# Patient Record
Sex: Female | Born: 1935
Health system: Southern US, Community
[De-identification: ages and names within clinical notes are randomized; demographics above are authoritative.]

## PROBLEM LIST (undated history)

## (undated) DIAGNOSIS — N200 Calculus of kidney: Secondary | ICD-10-CM

## (undated) DIAGNOSIS — M546 Pain in thoracic spine: Secondary | ICD-10-CM

## (undated) DIAGNOSIS — S12600A Unspecified displaced fracture of seventh cervical vertebra, initial encounter for closed fracture: Secondary | ICD-10-CM

## (undated) DIAGNOSIS — F0280 Dementia in other diseases classified elsewhere without behavioral disturbance: Secondary | ICD-10-CM

## (undated) DIAGNOSIS — R634 Abnormal weight loss: Secondary | ICD-10-CM

## (undated) DIAGNOSIS — K644 Residual hemorrhoidal skin tags: Secondary | ICD-10-CM

## (undated) DIAGNOSIS — K219 Gastro-esophageal reflux disease without esophagitis: Secondary | ICD-10-CM

## (undated) DIAGNOSIS — R609 Edema, unspecified: Secondary | ICD-10-CM

## (undated) DIAGNOSIS — M1990 Unspecified osteoarthritis, unspecified site: Secondary | ICD-10-CM

## (undated) DIAGNOSIS — F329 Major depressive disorder, single episode, unspecified: Secondary | ICD-10-CM

## (undated) DIAGNOSIS — G309 Alzheimer's disease, unspecified: Secondary | ICD-10-CM

## (undated) DIAGNOSIS — R269 Unspecified abnormalities of gait and mobility: Secondary | ICD-10-CM

## (undated) DIAGNOSIS — I10 Essential (primary) hypertension: Secondary | ICD-10-CM

## (undated) DIAGNOSIS — G609 Hereditary and idiopathic neuropathy, unspecified: Secondary | ICD-10-CM

## (undated) DIAGNOSIS — G4700 Insomnia, unspecified: Secondary | ICD-10-CM

## (undated) DIAGNOSIS — K259 Gastric ulcer, unspecified as acute or chronic, without hemorrhage or perforation: Secondary | ICD-10-CM

## (undated) DIAGNOSIS — E559 Vitamin D deficiency, unspecified: Secondary | ICD-10-CM

## (undated) DIAGNOSIS — K5900 Constipation, unspecified: Secondary | ICD-10-CM

## (undated) DIAGNOSIS — E46 Unspecified protein-calorie malnutrition: Secondary | ICD-10-CM

## (undated) DIAGNOSIS — F3289 Other specified depressive episodes: Secondary | ICD-10-CM

## (undated) DIAGNOSIS — R350 Frequency of micturition: Secondary | ICD-10-CM

## (undated) DIAGNOSIS — Z9181 History of falling: Secondary | ICD-10-CM

## (undated) DIAGNOSIS — E039 Hypothyroidism, unspecified: Secondary | ICD-10-CM

## (undated) HISTORY — DX: Vitamin D deficiency, unspecified: E55.9

## (undated) HISTORY — DX: Frequency of micturition: R35.0

## (undated) HISTORY — DX: Unspecified osteoarthritis, unspecified site: M19.90

## (undated) HISTORY — DX: Alzheimer's disease, unspecified: G30.9

## (undated) HISTORY — DX: Unspecified protein-calorie malnutrition: E46

## (undated) HISTORY — DX: Edema, unspecified: R60.9

## (undated) HISTORY — DX: History of falling: Z91.81

## (undated) HISTORY — DX: Hypothyroidism, unspecified: E03.9

## (undated) HISTORY — DX: Other specified depressive episodes: F32.89

## (undated) HISTORY — DX: Unspecified abnormalities of gait and mobility: R26.9

## (undated) HISTORY — DX: Abnormal weight loss: R63.4

## (undated) HISTORY — DX: Constipation, unspecified: K59.00

## (undated) HISTORY — DX: Calculus of kidney: N20.0

## (undated) HISTORY — DX: Unspecified displaced fracture of seventh cervical vertebra, initial encounter for closed fracture: S12.600A

## (undated) HISTORY — DX: Hereditary and idiopathic neuropathy, unspecified: G60.9

## (undated) HISTORY — DX: Major depressive disorder, single episode, unspecified: F32.9

## (undated) HISTORY — DX: Dementia in other diseases classified elsewhere, unspecified severity, without behavioral disturbance, psychotic disturbance, mood disturbance, and anxiety: F02.80

## (undated) HISTORY — DX: Gastric ulcer, unspecified as acute or chronic, without hemorrhage or perforation: K25.9

## (undated) HISTORY — DX: Pain in thoracic spine: M54.6

## (undated) HISTORY — DX: Residual hemorrhoidal skin tags: K64.4

## (undated) HISTORY — DX: Insomnia, unspecified: G47.00

---

## 2000-06-23 ENCOUNTER — Emergency Department (HOSPITAL_COMMUNITY): Admission: EM | Admit: 2000-06-23 | Discharge: 2000-06-24 | Payer: Self-pay | Admitting: Emergency Medicine

## 2000-06-23 ENCOUNTER — Encounter: Payer: Self-pay | Admitting: Emergency Medicine

## 2000-10-29 ENCOUNTER — Other Ambulatory Visit: Admission: RE | Admit: 2000-10-29 | Discharge: 2000-10-29 | Payer: Self-pay | Admitting: Internal Medicine

## 2001-01-27 ENCOUNTER — Encounter: Payer: Self-pay | Admitting: Emergency Medicine

## 2001-01-27 ENCOUNTER — Emergency Department (HOSPITAL_COMMUNITY): Admission: EM | Admit: 2001-01-27 | Discharge: 2001-01-27 | Payer: Self-pay | Admitting: Emergency Medicine

## 2001-09-16 ENCOUNTER — Encounter: Admission: RE | Admit: 2001-09-16 | Discharge: 2001-09-16 | Payer: Self-pay | Admitting: Internal Medicine

## 2001-09-16 ENCOUNTER — Encounter: Payer: Self-pay | Admitting: Internal Medicine

## 2002-05-25 ENCOUNTER — Emergency Department (HOSPITAL_COMMUNITY): Admission: EM | Admit: 2002-05-25 | Discharge: 2002-05-25 | Payer: Self-pay | Admitting: Emergency Medicine

## 2002-06-07 ENCOUNTER — Encounter: Payer: Self-pay | Admitting: Emergency Medicine

## 2002-06-07 ENCOUNTER — Emergency Department (HOSPITAL_COMMUNITY): Admission: EM | Admit: 2002-06-07 | Discharge: 2002-06-07 | Payer: Self-pay | Admitting: Emergency Medicine

## 2002-06-17 ENCOUNTER — Ambulatory Visit (HOSPITAL_COMMUNITY): Admission: RE | Admit: 2002-06-17 | Discharge: 2002-06-17 | Payer: Self-pay | Admitting: Urology

## 2002-06-26 ENCOUNTER — Encounter: Payer: Self-pay | Admitting: Emergency Medicine

## 2002-06-26 ENCOUNTER — Observation Stay (HOSPITAL_COMMUNITY): Admission: EM | Admit: 2002-06-26 | Discharge: 2002-06-26 | Payer: Self-pay | Admitting: Emergency Medicine

## 2003-05-22 ENCOUNTER — Emergency Department (HOSPITAL_COMMUNITY): Admission: EM | Admit: 2003-05-22 | Discharge: 2003-05-22 | Payer: Self-pay

## 2003-05-26 ENCOUNTER — Ambulatory Visit (HOSPITAL_COMMUNITY): Admission: AD | Admit: 2003-05-26 | Discharge: 2003-05-26 | Payer: Self-pay | Admitting: Urology

## 2005-01-03 ENCOUNTER — Ambulatory Visit: Payer: Self-pay | Admitting: Internal Medicine

## 2005-10-22 ENCOUNTER — Ambulatory Visit: Payer: Self-pay | Admitting: Internal Medicine

## 2005-10-24 ENCOUNTER — Ambulatory Visit (HOSPITAL_COMMUNITY): Admission: RE | Admit: 2005-10-24 | Discharge: 2005-10-24 | Payer: Self-pay | Admitting: Internal Medicine

## 2005-10-24 ENCOUNTER — Encounter (INDEPENDENT_AMBULATORY_CARE_PROVIDER_SITE_OTHER): Payer: Self-pay | Admitting: Specialist

## 2005-10-29 ENCOUNTER — Ambulatory Visit: Payer: Self-pay | Admitting: Internal Medicine

## 2005-11-04 ENCOUNTER — Ambulatory Visit: Payer: Self-pay | Admitting: Cardiology

## 2006-08-18 ENCOUNTER — Encounter: Payer: Self-pay | Admitting: Internal Medicine

## 2006-08-18 ENCOUNTER — Ambulatory Visit: Payer: Self-pay | Admitting: Internal Medicine

## 2006-08-18 DIAGNOSIS — E7800 Pure hypercholesterolemia, unspecified: Secondary | ICD-10-CM | POA: Insufficient documentation

## 2006-08-18 DIAGNOSIS — I10 Essential (primary) hypertension: Secondary | ICD-10-CM | POA: Insufficient documentation

## 2006-08-18 DIAGNOSIS — E039 Hypothyroidism, unspecified: Secondary | ICD-10-CM | POA: Insufficient documentation

## 2006-08-18 DIAGNOSIS — G2581 Restless legs syndrome: Secondary | ICD-10-CM | POA: Insufficient documentation

## 2006-08-18 LAB — CONVERTED CEMR LAB
ALT: 19 units/L (ref 0–35)
AST: 29 units/L (ref 0–37)
Albumin: 4.3 g/dL (ref 3.5–5.2)
Alkaline Phosphatase: 73 units/L (ref 39–117)
BUN: 12 mg/dL (ref 6–23)
Basophils Absolute: 0 10*3/uL (ref 0.0–0.1)
Basophils Relative: 0.5 % (ref 0.0–1.0)
Bilirubin, Direct: 0.1 mg/dL (ref 0.0–0.3)
CO2: 31 meq/L (ref 19–32)
Calcium: 9 mg/dL (ref 8.4–10.5)
Chloride: 113 meq/L — ABNORMAL HIGH (ref 96–112)
Cholesterol: 185 mg/dL (ref 0–200)
Creatinine, Ser: 0.7 mg/dL (ref 0.4–1.2)
Eosinophils Absolute: 0 10*3/uL (ref 0.0–0.6)
Eosinophils Relative: 0.8 % (ref 0.0–5.0)
GFR calc Af Amer: 105 mL/min
GFR calc non Af Amer: 87 mL/min
Glucose, Bld: 93 mg/dL (ref 70–99)
HCT: 43.8 % (ref 36.0–46.0)
HDL: 66.4 mg/dL (ref >39.0)
Hemoglobin: 14.7 g/dL (ref 12.0–15.0)
LDL Cholesterol: 103 mg/dL — ABNORMAL HIGH (ref 0–99)
Lymphocytes Relative: 28 % (ref 12.0–46.0)
MCHC: 33.6 g/dL (ref 30.0–36.0)
MCV: 102.8 fL — ABNORMAL HIGH (ref 78.0–100.0)
Monocytes Absolute: 0.4 10*3/uL (ref 0.2–0.7)
Monocytes Relative: 6.9 % (ref 3.0–11.0)
Neutro Abs: 3.8 10*3/uL (ref 1.4–7.7)
Neutrophils Relative %: 63.8 % (ref 43.0–77.0)
Platelets: 197 10*3/uL (ref 150–400)
Potassium: 4 meq/L (ref 3.5–5.1)
RBC: 4.26 M/uL (ref 3.87–5.11)
RDW: 11.8 % (ref 11.5–14.6)
Sodium: 146 meq/L — ABNORMAL HIGH (ref 135–145)
TSH: 8.69 microintl units/mL — ABNORMAL HIGH (ref 0.35–5.50)
Total Bilirubin: 1.3 mg/dL — ABNORMAL HIGH (ref 0.3–1.2)
Total CHOL/HDL Ratio: 2.8
Total Protein: 7.4 g/dL (ref 6.0–8.3)
Triglycerides: 76 mg/dL (ref 0–149)
VLDL: 15 mg/dL (ref 0–40)
WBC: 5.9 10*3/uL (ref 4.5–10.5)

## 2007-03-18 ENCOUNTER — Encounter: Payer: Self-pay | Admitting: Internal Medicine

## 2007-06-09 ENCOUNTER — Emergency Department (HOSPITAL_COMMUNITY): Admission: EM | Admit: 2007-06-09 | Discharge: 2007-06-09 | Payer: Self-pay | Admitting: Emergency Medicine

## 2009-12-12 ENCOUNTER — Emergency Department (HOSPITAL_COMMUNITY): Admission: EM | Admit: 2009-12-12 | Discharge: 2009-12-13 | Payer: Self-pay | Admitting: Emergency Medicine

## 2009-12-26 ENCOUNTER — Encounter: Admission: RE | Admit: 2009-12-26 | Discharge: 2009-12-26 | Payer: Self-pay | Admitting: Internal Medicine

## 2010-03-10 ENCOUNTER — Encounter: Payer: Self-pay | Admitting: *Deleted

## 2010-03-11 ENCOUNTER — Emergency Department (HOSPITAL_COMMUNITY)
Admission: EM | Admit: 2010-03-11 | Discharge: 2010-03-11 | Payer: Self-pay | Source: Home / Self Care | Admitting: Emergency Medicine

## 2010-03-12 LAB — CBC
HCT: 42.5 % (ref 36.0–46.0)
Hemoglobin: 15.1 g/dL — ABNORMAL HIGH (ref 12.0–15.0)
MCH: 34.8 pg — ABNORMAL HIGH (ref 26.0–34.0)
MCHC: 35.5 g/dL (ref 30.0–36.0)
MCV: 97.9 fL (ref 78.0–100.0)
Platelets: 170 10*3/uL (ref 150–400)
RBC: 4.34 MIL/uL (ref 3.87–5.11)
RDW: 12.5 % (ref 11.5–15.5)
WBC: 5.5 10*3/uL (ref 4.0–10.5)

## 2010-03-12 LAB — POCT CARDIAC MARKERS
CKMB, poc: 1.5 ng/mL (ref 1.0–8.0)
Myoglobin, poc: 115 ng/mL (ref 12–200)
Troponin i, poc: <0.05 ng/mL (ref 0.00–0.09)

## 2010-03-12 LAB — DIFFERENTIAL
Basophils Absolute: 0 10*3/uL (ref 0.0–0.1)
Basophils Relative: 1 % (ref 0–1)
Eosinophils Absolute: 0.1 10*3/uL (ref 0.0–0.7)
Eosinophils Relative: 2 % (ref 0–5)
Lymphocytes Relative: 27 % (ref 12–46)
Lymphs Abs: 1.5 10*3/uL (ref 0.7–4.0)
Monocytes Absolute: 0.4 10*3/uL (ref 0.1–1.0)
Monocytes Relative: 8 % (ref 3–12)
Neutro Abs: 3.4 10*3/uL (ref 1.7–7.7)
Neutrophils Relative %: 63 % (ref 43–77)

## 2010-03-12 LAB — HEPATIC FUNCTION PANEL
ALT: 19 U/L (ref 0–35)
AST: 35 U/L (ref 0–37)
Albumin: 4 g/dL (ref 3.5–5.2)
Alkaline Phosphatase: 78 U/L (ref 39–117)
Bilirubin, Direct: 0.3 mg/dL (ref 0.0–0.3)
Indirect Bilirubin: 1.3 mg/dL — ABNORMAL HIGH (ref 0.3–0.9)
Total Bilirubin: 1.6 mg/dL — ABNORMAL HIGH (ref 0.3–1.2)
Total Protein: 7.5 g/dL (ref 6.0–8.3)

## 2010-03-12 LAB — POCT I-STAT, CHEM 8
BUN: 22 mg/dL (ref 6–23)
Calcium, Ion: 0.97 mmol/L — ABNORMAL LOW (ref 1.12–1.32)
Chloride: 103 mEq/L (ref 96–112)
Creatinine, Ser: 1 mg/dL (ref 0.4–1.2)
Glucose, Bld: 91 mg/dL (ref 70–99)
HCT: 44 % (ref 36.0–46.0)
Hemoglobin: 15 g/dL (ref 12.0–15.0)
Potassium: 3.6 mEq/L (ref 3.5–5.1)
Sodium: 139 mEq/L (ref 135–145)
TCO2: 30 mmol/L (ref 0–100)

## 2010-03-12 LAB — LIPASE, BLOOD: Lipase: 21 U/L (ref 11–59)

## 2010-03-19 NOTE — Miscellaneous (Signed)
  Medications Added SYNTHROID 75 MCG  TABS (LEVOTHYROXINE SODIUM) 1 once daily ZOLPIDEM TARTRATE 5 MG  TABS (ZOLPIDEM TARTRATE) 1 at bedtime as needed       Clinical Lists Changes  Medications: Added new medication of ZOLPIDEM TARTRATE 5 MG  TABS (ZOLPIDEM TARTRATE) 1 at bedtime as needed - Signed Changed medication from SYNTHROID 75 MCG  TABS (LEVOTHYROXINE SODIUM) to SYNTHROID 75 MCG  TABS (LEVOTHYROXINE SODIUM) 1 once daily Rx of ZOLPIDEM TARTRATE 5 MG  TABS (ZOLPIDEM TARTRATE) 1 at bedtime as needed;  #30 x 1;  Signed;  Entered by: Raechel Ache, RN;  Authorized by: [name]  MD;  Method used: Historical    Prescriptions: ZOLPIDEM TARTRATE 5 MG  TABS (ZOLPIDEM TARTRATE) 1 at bedtime as needed  #30 x 1   Entered by:   Raechel Ache, RN   Authorized by:   [name]  MD   Signed by:   Raechel Ache, RN on 03/18/2007   Method used:   Historical   RxID:   [id number]

## 2010-03-19 NOTE — Assessment & Plan Note (Signed)
   Vital Signs:  Patient Profile:   77 Years Old Female Weight:      132 pounds BP supine:   208 / 110 BP sitting:   160 / 94               History of Present Illness: 77 year old Caucasian female scheduled for CPX today but declines  Current Allergies: No known allergies       Physical Exam  General:     underweight appearing.   Head:     Normocephalic and atraumatic without obvious abnormalities. No apparent alopecia or balding. Eyes:     No corneal or conjunctival inflammation noted. EOMI. Perrla. Funduscopic exam benign, without hemorrhages, exudates or papilledema. Vision grossly normal. Mouth:     Oral mucosa and oropharynx without lesions or exudates.  Teeth in good repair. Neck:     No deformities, masses, or tenderness noted. Lungs:     Normal respiratory effort, chest expands symmetrically. Lungs are clear to auscultation, no crackles or wheezes. Heart:     Normal rate and regular rhythm. S1 and S2 normal without gallop, murmur, click, rub or other extra sounds. Abdomen:     Bowel sounds positive,abdomen soft and non-tender without masses, organomegaly or hernias noted. Msk:     No deformity or scoliosis noted of thoracic or lumbar spine.   Pulses:     R and L carotid,radial,femoral,dorsalis pedis and posterior tibial pulses are full and equal bilaterally Extremities:     No clubbing, cyanosis, edema, or deformity noted with normal full range of motion of all joints.      Impression & Recommendations:  Problem # 1:  HYPERTENSION (ICD-401.9)  Her updated medication list for this problem includes:    Hydrochlorothiazide 25 Mg Tabs (Hydrochlorothiazide) .Marland Kitchen... Take one (1) by mouth daily   Problem # 2:  HYPOTHYROIDISM (ICD-244.9)  Her updated medication list for this problem includes:    Synthroid 75 Mcg Tabs (Levothyroxine sodium)   Medications Added to Medication List This Visit: 1)  Hydrochlorothiazide 25 Mg Tabs (Hydrochlorothiazide) ....  Take one (1) by mouth daily 2)  Synthroid 75 Mcg Tabs (Levothyroxine sodium)   Patient Instructions: 1)  Please schedule a follow-up appointment in 3 months. 2)  check fasting lab

## 2010-05-01 LAB — COMPREHENSIVE METABOLIC PANEL
ALT: 16 U/L (ref 0–35)
AST: 33 U/L (ref 0–37)
Albumin: 4.7 g/dL (ref 3.5–5.2)
Alkaline Phosphatase: 73 U/L (ref 39–117)
BUN: 15 mg/dL (ref 6–23)
CO2: 27 mEq/L (ref 19–32)
Calcium: 10 mg/dL (ref 8.4–10.5)
Chloride: 103 mEq/L (ref 96–112)
Creatinine, Ser: 0.88 mg/dL (ref 0.4–1.2)
GFR calc non Af Amer: >60 mL/min (ref >60)
Glucose, Bld: 115 mg/dL — ABNORMAL HIGH (ref 70–99)
Potassium: 3.2 mEq/L — ABNORMAL LOW (ref 3.5–5.1)
Sodium: 141 mEq/L (ref 135–145)
Total Bilirubin: 0.8 mg/dL (ref 0.3–1.2)
Total Protein: 8.1 g/dL (ref 6.0–8.3)

## 2010-05-01 LAB — DIFFERENTIAL
Basophils Absolute: 0 10*3/uL (ref 0.0–0.1)
Basophils Relative: 0 % (ref 0–1)
Eosinophils Absolute: 0.1 10*3/uL (ref 0.0–0.7)
Eosinophils Relative: 1 % (ref 0–5)
Lymphocytes Relative: 24 % (ref 12–46)
Lymphs Abs: 2.1 10*3/uL (ref 0.7–4.0)
Monocytes Absolute: 0.5 10*3/uL (ref 0.1–1.0)
Monocytes Relative: 6 % (ref 3–12)
Neutro Abs: 6 10*3/uL (ref 1.7–7.7)
Neutrophils Relative %: 69 % (ref 43–77)

## 2010-05-01 LAB — CBC
HCT: 42.5 % (ref 36.0–46.0)
Hemoglobin: 15.1 g/dL — ABNORMAL HIGH (ref 12.0–15.0)
MCH: 35.4 pg — ABNORMAL HIGH (ref 26.0–34.0)
MCHC: 35.5 g/dL (ref 30.0–36.0)
MCV: 99.8 fL (ref 78.0–100.0)
Platelets: 169 10*3/uL (ref 150–400)
RBC: 4.26 MIL/uL (ref 3.87–5.11)
RDW: 12.3 % (ref 11.5–15.5)
WBC: 8.7 10*3/uL (ref 4.0–10.5)

## 2010-05-01 LAB — URINALYSIS, ROUTINE W REFLEX MICROSCOPIC
Bilirubin Urine: NEGATIVE
Glucose, UA: NEGATIVE mg/dL
Hgb urine dipstick: NEGATIVE
Ketones, ur: 15 mg/dL — AB
Nitrite: NEGATIVE
Protein, ur: NEGATIVE mg/dL
Specific Gravity, Urine: 1.014 (ref 1.005–1.030)
Urobilinogen, UA: 0.2 mg/dL (ref 0.0–1.0)
pH: 8 (ref 5.0–8.0)

## 2010-05-01 LAB — LACTIC ACID, PLASMA: Lactic Acid, Venous: 1.9 mmol/L (ref 0.5–2.2)

## 2010-05-01 LAB — LIPASE, BLOOD: Lipase: 25 U/L (ref 11–59)

## 2010-07-05 NOTE — Assessment & Plan Note (Signed)
[facility]                           GASTROENTEROLOGY OFFICE NOTE   [name], [name]                        MRN:          [id number]  DATE:10/22/2005                            DOB:          [birth year]    CHIEF COMPLAINT:  Stomach gurgling.   HISTORY:  This is a pleasant 77 year old white woman that has had worsening  constipation over the last year.  She has noted a gurgling, a rumbling in  her left lower quadrant area that occurs often if she takes a deep breath.  She gets embarrassed when this occurs in church. The symptoms really started  in the last six to twelve months.  She has dropped from 154 pounds to 133  pounds unintentionally.  She says she knows she does not eat right and she  has been busy caring for her husband who has progressive Alzheimer's.  She  bloats at times.  She denies bleeding. No family history of colon cancer.   MEDICATIONS:  1. Metoprolol 100 mg daily.  2. Levoxyl 75 micrograms daily.   ALLERGIES:  No known drug allergies.   PAST MEDICAL HISTORY:  1. Hypertension.  2. Hypothyroidism.  3. Nephrolithiasis.  4. Osteoarthritis.   SURGERIES:  No surgeries.   FAMILY HISTORY:  Mother had uterine and ovarian cancer.   SOCIAL HISTORY:  Married.  Social situation as above.  One son, two  daughters.  No alcohol, tobacco or drugs.   REVIEW OF SYSTEMS:  See medical history for full details. This is reflected  in my office work sheet.   PHYSICAL EXAMINATION:  GENERAL: Well developed, elderly white woman.  VITAL SIGNS:  Height 5 feet, 9 inches.  Weight 133.6 pounds. Blood pressure  124/80.  Pulse 64.  EYES:  Anicteric.  ENT:  __________ pharynx.  NECK:  Supple.  CHEST:  Clear.  HEART:  S1, S2.  No murmurs, rubs or gallops.  ABDOMEN:  Is mildly tender in the left lower quadrant, possible fullness  there, though that is transient.  RECTAL:  Deferred.  LYMPHATIC:  No neck or supraclavicular nodes.  EXTREMITIES:  No  edema.  SKIN:  No rash.  NEUROLOGICAL/PSYCH:  She is alert and oriented x3.   ASSESSMENT:  Change in bowel habits with constipation, possible fullness or  tenderness in the left lower quadrant, though mild changes, unintentional  weight loss.  Colon cancer must be excluded.   PLAN:  Colonoscopy to investigate.  Rational for the procedure including  looking for possible colon cancer as well as risks, benefits and indications  are explained to the patient.  She understands and agrees to proceed.  Should this be unrevealing then, a subsequent esophagogastroduodenoscopy  verses a computerized tomography scan would be needed I think.  I did not  initiate lab work at this time but that may be necessary as well.                                   Iva Boop, MD,FACG   CEG/MedQ  DD:  10/22/2005  DT:  10/22/2005  Job #:  [id number]   cc:   [name], MD

## 2010-07-05 NOTE — Op Note (Signed)
NAME:  [name], [name]                           ACCOUNT NO.:  [id number]   MEDICAL RECORD NO.:  [id number]                   PATIENT TYPE:  AMB   LOCATION:  DAY                                  FACILITY:  [facility]   PHYSICIAN:  Jamison Neighbor, M.D.               DATE OF BIRTH:  [birth year]   DATE OF PROCEDURE:  05/26/2003  DATE OF DISCHARGE:                                 OPERATIVE REPORT   PREOPERATIVE DIAGNOSIS:  Right ureteral calculus.   POSTOPERATIVE DIAGNOSIS:  Right ureteral calculus.   PROCEDURE:  Cystoscopy, right retrograde, right ureterostomy, and right  stone extraction.   SURGEON:  Jamison Neighbor, M.D.   ANESTHESIA:  General.   COMPLICATIONS:  None.   DRAINS:  None.   BRIEF HISTORY:  This 77 year old female was found to have a stone in the  right ureter.  She was told earlier in the week that the stone was very  small and that she probably would be able to have it pass.  She was  scheduled to come in for follow up next week in case the stone had not  passed.  The patient called this morning, urgently, because the pain had  gotten worse and she presented to the emergency room with extreme pain.  She  had not had anything to eat or drink since early this morning and we made  arrangements for her come to ER for a stone extraction.  She understands the  risks and benefits of the procedure.  She knows that she may require a  stent.  Full informed consent was obtained.   DESCRIPTION OF PROCEDURE:  After  successful induction of general anesthesia  the patient was placed in the dorsal lithotomy position, prepped with  Betadine, and draped in the usual sterile fashion.  Cystoscopy was  performed.  The urethra was visualized in its entirety and found to be  normal.  The bladder itself was unremarkable.  __________ were normal in  configuration and location.  The appearance of the right ureter indicated  that it would be possible to pass the ureteroscope without dilation.   A  guidewire was inserted and the retrograde study that was performed over the  ureteral catheter demonstrated hydronephrosis and a standard filling defect  in the distal ureter.   The guidewire was then advanced up to the kidney and the ureteroscope was  then inserted alongside the guidewire.  The stone was seen in the midureter.  It was grasped under direct vision with the Nitinol basket and then, under  direct vision, was extracted.  The ureteroscope was reinserted and the  entire ureter was inspected and no other stones could be seen.  There was no  injury to the ureter, no evidence of stricture, stenosis or other problems,  and it was felt  that the patient could get by without a double-J.  The bladder was drained.  The patient tolerated the procedure well and was taken to the recovery room  in good condition.   She will be given a prescription for Tylox and she will return to see [place] in  follow up.      ___________________________________________                                            Jamison Neighbor, M.D.   RJE/MEDQ  D:  05/26/2003  T:  05/27/2003  Job:  [id number]

## 2010-07-05 NOTE — Op Note (Signed)
NAME:  [name], [name]                           ACCOUNT NO.:  [id number]   MEDICAL RECORD NO.:  [id number]                   PATIENT TYPE:  AMB   LOCATION:  DAY                                  FACILITY:  [facility]   PHYSICIAN:  Jamison Neighbor, M.D.               DATE OF BIRTH:  August 13, 1931   DATE OF PROCEDURE:  06/17/2002  DATE OF DISCHARGE:                                 OPERATIVE REPORT   SERVICE:  Urology.   PREOPERATIVE DIAGNOSIS:  Left UVJ stone.   POSTOPERATIVE DIAGNOSIS:  Left UVJ stone.   PROCEDURE:  Cystoscopy, urethral calibration, left retrograde with  interpretation, left ureteroscopy with stone extraction, left double J  catheter insertion.   SURGEON:  Jamison Neighbor, M.D.   ANESTHESIA:  General.   COMPLICATIONS:  None.   DRAINS:  6 French x26 cm double J catheter attached to a string.   HISTORY:  This 77 year old female presented to the office with urgency and  frequency as well as hematuria as part of her evaluation. A CT scan was  obtained which demonstrated a stone in the distal left ureter. The patient  has had some symptoms of left sided flank pain although she notes that over  the past few days these have not gotten worse and have actually gotten  better. The patient has not passed the stone and has been diligent about  straining her urine. The patient is to undergo retrograde and probable  ureteroscopy. The patient asked that she undergo a urethral calibration at  the time of the procedure as she has the feel that she may have a stenotic  urethra. The patient understands the risks and benefits of the procedure and  gave full and informed consent.   DESCRIPTION OF PROCEDURE:  After successful induction of general anesthesia,  the patient was placed in the dorsal lithotomy position, prepped with  Betadine and draped in the usual sterile fashion. Careful bimanual  examination revealed no palpable abnormalities to the urethra, specifically  no evidence of  suburethral mass or diverticulum. There was no significant  cystocele, rectocele or enterocele. The urethra was calibrated to 14 [place]  with female urethral sounds as per the patient's request. The cystoscope was  inserted, the bladder was carefully inspected and was free of any tumor or  stones. Both ureteral orifices were normal in configuration and location.  There were no mucosal abnormalities detected. The left retrograde study  showed moderate hydronephrosis. A guidewire was passed up to the kidney. The  ureteroscope was passed but could not negotiate the intramural tunnel, was  withdrawn and the cystoscope was back loaded over the wire. The distal  ureter was then dilated with a 4 cm balloon dilator. That was removed and  the ureteroscope was then reintroduced along side the wire. A stone was seen  in the distal ureter which was then extracted. This was flushed from the  distal ureter and there was no need to use the basket. The ureter was then  inspected and no other stones could be seen. Because the ureter had been  traumatized by the balloon dilation, it was felt that a stent needed to be  placed. The stent was then passed over the guidewire and allowed to coil in  the renal pelvis. It was positioned so that the coil in the kidney was just  inside the bladder. The  string was brought to the outside and will be attached to the thigh with  Tegaderm. The patient received a B&O suppository for postoperative  management. She will be sent hom with Lorcet plus, Pyridium plus as well as  Septra to be taken daily until the stent is removed.                                               Jamison Neighbor, M.D.    RJE/MEDQ  D:  06/17/2002  T:  06/17/2002  Job:  [id number]   cc:   [name], M.D. [facility]

## 2010-07-05 NOTE — H&P (Signed)
NAME:  [name], [name]                           ACCOUNT NO.:  [id number]   MEDICAL RECORD NO.:  [id number]                   PATIENT TYPE:  INP   LOCATION:  0355                                 [facility]ACILITY[facility]  [facility]   PHYSICIAN:  Georgina Quint. Plotnikov, M.D. [facility]      DATE OF BIRTH:  [birth year]   DATE OF ADMISSION:  06/25/2002  DATE OF DISCHARGE:  06/26/2002                                HISTORY & PHYSICAL   COMBINED HISTORY AND DISCHARGE FOR 24 HOUR OBSERVATION:   HISTORY:  The patient is a 77 year old female who was working in her garden  around 2 o'clock on Jun 25, 2002 pulling weeds under her deck when she was  stung in the right forearm, developed immediate swelling of the forearm,  took Excedrin, Benadryl and Tylenol, later developed rash covering her upper  extremities, chest and back. She developed a fever and presented to the  emergency room where she received IV Decadron and 20 mg epinephrine, Pepcid  AC. By 1:30 a.m., the rash was better and the patient felt better too. She  was hospitalized for further observation.   HOSPITAL COURSE:  Overnight, she did well. On the morning of discharge, she  has no rash, no active complaints, her temperature is 98.1, heart rate 93,  respirations 20, blood pressure 142/72. saturations 92% on room air. HEENT  with moist mucosa, no swelling. Skin clear, no rash. Upper extremities  without swelling. Lungs clear with decreased breath sounds diffusely, heart  S1, S2. Abdomen soft and tender. Lower extremities without edema. She is  alert, oriented and cooperative.   Labs, potassium 3.3, SGOT 600, SGPT 313, UA normal. Chest x-ray with COPD.  Blood count with white count 3.1, hemoglobin 12.0, neutrophils 80%, INR 1.0.   DISCHARGE DIAGNOSES:  1. Systemic allergic reaction resolved likely due to an insect bite. Doubt     snake bite (no tooth markings).  2. Fever due to problem #1 resolved promptly.  3. Elevated liver function tests likely due to  problem #1. Will need to     repeat in two days. No previous history of hepatitis or liver problems.  4. Hypertension, continue current therapy.  5. Hypothyroidism.  6. COPD of unknown degree likely due to passive smoking.   DISCHARGE MEDICATIONS:  1. Decadron 10 mg p.o. daily for 3 days.  2. Benadryl 25 mg twice a day for 1 day.  3. Pepcid AC 2 once a day for 3 days.  4. Cipro 500 mg p.o. b.i.d. for 7 days if fever.  5. Resume home medicines __________ as needed.   ACTIVITY:  As tolerated.    SPECIAL INSTRUCTIONS:  Call if problems.   FOLLOW UP:  Dr. Amador Cunas in 2 days with repeat lab work.  Georgina Quint. Plotnikov, M.D. [facility]    AVP/MEDQ  D:  06/26/2002  T:  06/27/2002  Job:  [id number]   cc:   [name], M.D. [facility]

## 2010-07-25 ENCOUNTER — Other Ambulatory Visit: Payer: Self-pay | Admitting: Gastroenterology

## 2010-08-29 ENCOUNTER — Other Ambulatory Visit: Payer: Self-pay | Admitting: Gastroenterology

## 2010-08-30 ENCOUNTER — Emergency Department (HOSPITAL_COMMUNITY): Payer: Medicare Other

## 2010-08-30 ENCOUNTER — Inpatient Hospital Stay (HOSPITAL_COMMUNITY)
Admission: EM | Admit: 2010-08-30 | Discharge: 2010-09-04 | DRG: 552 | Disposition: A | Discharge status: Skilled Nursing Facility | Payer: Medicare Other | Attending: Internal Medicine | Admitting: Internal Medicine

## 2010-08-30 DIAGNOSIS — S22009A Unspecified fracture of unspecified thoracic vertebra, initial encounter for closed fracture: Secondary | ICD-10-CM | POA: Diagnosis present

## 2010-08-30 DIAGNOSIS — R636 Underweight: Secondary | ICD-10-CM | POA: Diagnosis present

## 2010-08-30 DIAGNOSIS — Y93G1 Activity, food preparation and clean up: Secondary | ICD-10-CM

## 2010-08-30 DIAGNOSIS — E46 Unspecified protein-calorie malnutrition: Secondary | ICD-10-CM | POA: Diagnosis present

## 2010-08-30 DIAGNOSIS — K219 Gastro-esophageal reflux disease without esophagitis: Secondary | ICD-10-CM | POA: Diagnosis present

## 2010-08-30 DIAGNOSIS — R079 Chest pain, unspecified: Secondary | ICD-10-CM | POA: Diagnosis present

## 2010-08-30 DIAGNOSIS — T502X5A Adverse effect of carbonic-anhydrase inhibitors, benzothiadiazides and other diuretics, initial encounter: Secondary | ICD-10-CM | POA: Diagnosis present

## 2010-08-30 DIAGNOSIS — W19XXXA Unspecified fall, initial encounter: Secondary | ICD-10-CM | POA: Diagnosis present

## 2010-08-30 DIAGNOSIS — E039 Hypothyroidism, unspecified: Secondary | ICD-10-CM | POA: Diagnosis present

## 2010-08-30 DIAGNOSIS — Z681 Body mass index (BMI) 19 or less, adult: Secondary | ICD-10-CM

## 2010-08-30 DIAGNOSIS — F068 Other specified mental disorders due to known physiological condition: Secondary | ICD-10-CM | POA: Diagnosis present

## 2010-08-30 DIAGNOSIS — I951 Orthostatic hypotension: Secondary | ICD-10-CM | POA: Diagnosis present

## 2010-08-30 DIAGNOSIS — R748 Abnormal levels of other serum enzymes: Secondary | ICD-10-CM | POA: Diagnosis present

## 2010-08-30 DIAGNOSIS — I10 Essential (primary) hypertension: Secondary | ICD-10-CM | POA: Diagnosis present

## 2010-08-30 DIAGNOSIS — E876 Hypokalemia: Secondary | ICD-10-CM | POA: Diagnosis present

## 2010-08-30 DIAGNOSIS — Y92009 Unspecified place in unspecified non-institutional (private) residence as the place of occurrence of the external cause: Secondary | ICD-10-CM

## 2010-08-30 DIAGNOSIS — R110 Nausea: Secondary | ICD-10-CM | POA: Diagnosis present

## 2010-08-30 DIAGNOSIS — Z8711 Personal history of peptic ulcer disease: Secondary | ICD-10-CM

## 2010-08-30 DIAGNOSIS — I4430 Unspecified atrioventricular block: Secondary | ICD-10-CM | POA: Diagnosis present

## 2010-08-30 DIAGNOSIS — F341 Dysthymic disorder: Secondary | ICD-10-CM | POA: Diagnosis present

## 2010-08-30 DIAGNOSIS — S12600A Unspecified displaced fracture of seventh cervical vertebra, initial encounter for closed fracture: Principal | ICD-10-CM | POA: Diagnosis present

## 2010-08-30 DIAGNOSIS — R5381 Other malaise: Secondary | ICD-10-CM | POA: Diagnosis present

## 2010-08-30 LAB — URINALYSIS, ROUTINE W REFLEX MICROSCOPIC
Bilirubin Urine: NEGATIVE
Glucose, UA: NEGATIVE mg/dL
Hgb urine dipstick: NEGATIVE
Ketones, ur: NEGATIVE mg/dL
Leukocytes, UA: NEGATIVE
Nitrite: NEGATIVE
Protein, ur: NEGATIVE mg/dL
Specific Gravity, Urine: 1.013 (ref 1.005–1.030)
Urobilinogen, UA: 0.2 mg/dL (ref 0.0–1.0)
pH: 7 (ref 5.0–8.0)

## 2010-08-30 LAB — CBC
HCT: 38.4 % (ref 36.0–46.0)
Hemoglobin: 13.2 g/dL (ref 12.0–15.0)
MCH: 34.1 pg — ABNORMAL HIGH (ref 26.0–34.0)
MCHC: 34.4 g/dL (ref 30.0–36.0)
MCV: 99.2 fL (ref 78.0–100.0)
Platelets: 150 10*3/uL (ref 150–400)
RBC: 3.87 MIL/uL (ref 3.87–5.11)
RDW: 12.6 % (ref 11.5–15.5)
WBC: 7.1 10*3/uL (ref 4.0–10.5)

## 2010-08-30 LAB — DIFFERENTIAL
Basophils Absolute: 0 10*3/uL (ref 0.0–0.1)
Basophils Relative: 0 % (ref 0–1)
Eosinophils Absolute: 0 10*3/uL (ref 0.0–0.7)
Eosinophils Relative: 0 % (ref 0–5)
Lymphocytes Relative: 12 % (ref 12–46)
Lymphs Abs: 0.9 10*3/uL (ref 0.7–4.0)
Monocytes Absolute: 0.4 10*3/uL (ref 0.1–1.0)
Monocytes Relative: 6 % (ref 3–12)
Neutro Abs: 5.9 10*3/uL (ref 1.7–7.7)
Neutrophils Relative %: 82 % — ABNORMAL HIGH (ref 43–77)

## 2010-08-30 LAB — COMPREHENSIVE METABOLIC PANEL
ALT: 34 U/L (ref 0–35)
AST: 42 U/L — ABNORMAL HIGH (ref 0–37)
Albumin: 3.9 g/dL (ref 3.5–5.2)
Alkaline Phosphatase: 67 U/L (ref 39–117)
BUN: 18 mg/dL (ref 6–23)
CO2: 28 mEq/L (ref 19–32)
Calcium: 9.3 mg/dL (ref 8.4–10.5)
Chloride: 102 mEq/L (ref 96–112)
Creatinine, Ser: 0.75 mg/dL (ref 0.50–1.10)
GFR calc Af Amer: >60 mL/min (ref >60)
GFR calc non Af Amer: >60 mL/min (ref >60)
Glucose, Bld: 92 mg/dL (ref 70–99)
Potassium: 3.1 mEq/L — ABNORMAL LOW (ref 3.5–5.1)
Sodium: 140 mEq/L (ref 135–145)
Total Bilirubin: 0.9 mg/dL (ref 0.3–1.2)
Total Protein: 6.7 g/dL (ref 6.0–8.3)

## 2010-08-30 LAB — CK TOTAL AND CKMB (NOT AT ARMC)
CK, MB: 10.6 ng/mL (ref 0.3–4.0)
Relative Index: 4 — ABNORMAL HIGH (ref 0.0–2.5)
Total CK: 265 U/L — ABNORMAL HIGH (ref 7–177)

## 2010-08-30 LAB — TROPONIN I: Troponin I: <0.3 ng/mL (ref <0.30)

## 2010-08-30 MED ORDER — IOHEXOL 300 MG/ML  SOLN
100.0000 mL | Freq: Once | INTRAMUSCULAR | Status: AC | PRN
  Administered 2010-08-30: 100 mL via INTRAVENOUS

## 2010-08-31 LAB — BASIC METABOLIC PANEL
BUN: 12 mg/dL (ref 6–23)
CO2: 31 mEq/L (ref 19–32)
Calcium: 9 mg/dL (ref 8.4–10.5)
Chloride: 102 mEq/L (ref 96–112)
Creatinine, Ser: 0.67 mg/dL (ref 0.50–1.10)
GFR calc Af Amer: >60 mL/min (ref >60)
GFR calc non Af Amer: >60 mL/min (ref >60)
Glucose, Bld: 110 mg/dL — ABNORMAL HIGH (ref 70–99)
Potassium: 3.5 mEq/L (ref 3.5–5.1)
Sodium: 137 mEq/L (ref 135–145)

## 2010-08-31 LAB — LIPID PANEL
Cholesterol: 153 mg/dL (ref 0–200)
HDL: 68 mg/dL (ref >39)
LDL Cholesterol: 73 mg/dL (ref 0–99)
Total CHOL/HDL Ratio: 2.3 RATIO
Triglycerides: 61 mg/dL (ref <150)
VLDL: 12 mg/dL (ref 0–40)

## 2010-08-31 LAB — MAGNESIUM: Magnesium: 1.9 mg/dL (ref 1.5–2.5)

## 2010-08-31 LAB — HEMOGLOBIN A1C
Hgb A1c MFr Bld: 5.3 % (ref <5.7)
Mean Plasma Glucose: 105 mg/dL (ref <117)

## 2010-08-31 LAB — CARDIAC PANEL(CRET KIN+CKTOT+MB+TROPI)
CK, MB: 15.2 ng/mL (ref 0.3–4.0)
CK, MB: 11.6 ng/mL (ref 0.3–4.0)
Relative Index: 3.6 — ABNORMAL HIGH (ref 0.0–2.5)
Relative Index: 2.9 — ABNORMAL HIGH (ref 0.0–2.5)
Total CK: 425 U/L — ABNORMAL HIGH (ref 7–177)
Total CK: 398 U/L — ABNORMAL HIGH (ref 7–177)
Troponin I: <0.3 ng/mL (ref <0.30)
Troponin I: <0.3 ng/mL (ref <0.30)

## 2010-08-31 LAB — TSH: TSH: 6.284 u[IU]/mL — ABNORMAL HIGH (ref 0.350–4.500)

## 2010-08-31 LAB — PHOSPHORUS: Phosphorus: 3.3 mg/dL (ref 2.3–4.6)

## 2010-08-31 NOTE — Consult Note (Signed)
NAMEVALOREE, AGENT                 ACCOUNT NO.:  [id number]  MEDICAL RECORD NO.:  [id number]  LOCATION:  1433                         FACILITY:  [facility]  PHYSICIAN:  Donalee Citrin, M.D.        DATE OF BIRTH:  Aug 18, 1931  DATE OF CONSULTATION:  08/30/2010 DATE OF DISCHARGE:                                CONSULTATION   REASON FOR CONSULTATION:  C7-T1 fractures.  HISTORY OF PRESENT ILLNESS:  The patient is a 77 year old female who had a syncopal episode at home.  She has been sick in the last couple of week and having a lot of epigastric discomfort.  She is being worked up for gallbladder and various thing and she is making herself some breakfast and then passed out.  She does not remember striking anything, does not remember hitting around the event.  When she came to she was alert and oriented.  She was brought to the emergency department.  On evaluation, she was noted to have a C7 and T1 fracture and we have been consulted.  PAST MEDICAL HISTORY:  Her past medical history is remarkable for some mild dementia and  gastroesophageal reflux.  SOCIAL HISTORY:  She lives alone.  Otherwise nonsmoker, nondrinker.  She did have an appointment with a gastroenterologist on Monday to work up the gallbladder and GI discomfort.  PHYSICAL EXAMINATION:  GENERAL:  On exam, she is very pleasant awake, alert 77 year old female in no acute distress. HEENT:  Within normal limits.  Pupils are equal, round, reactive to light. Extraocular movements are intact.  Cranial nerves are intact.  Strength 5/5 in the upper and lower extremities.  No pronator drift.  Reflexes are normal and symmetric.  She does have some lumbosacral tenderness that is old for her.  She has a little bit of tenderness in the base of her neck as well.  Her CT scan does show a small anterior chip fracture of C7 and a small posterior superior chip fracture at T1 both fractures are nondisplaced both I think will heal fine on brace.  I  would recommend getting __________ for a collar with a thoracic extension to help stabilize T1 and apparently she can be admitted to medicine for workup of her syncope.  I will let her get a brace and then when she is cleared from this and she is able to be discharged home with brace with followup with me in approximately 1 to 2 weeks with followup x-rays.          ______________________________ Donalee Citrin, M.D.     GC/MEDQ  D:  08/30/2010  T:  08/31/2010  Job:  [id number]

## 2010-09-01 ENCOUNTER — Inpatient Hospital Stay (HOSPITAL_COMMUNITY): Payer: Medicare Other

## 2010-09-01 DIAGNOSIS — I369 Nonrheumatic tricuspid valve disorder, unspecified: Secondary | ICD-10-CM

## 2010-09-01 LAB — COMPREHENSIVE METABOLIC PANEL
ALT: 27 U/L (ref 0–35)
AST: 34 U/L (ref 0–37)
Albumin: 3.7 g/dL (ref 3.5–5.2)
Alkaline Phosphatase: 63 U/L (ref 39–117)
BUN: 10 mg/dL (ref 6–23)
CO2: 28 mEq/L (ref 19–32)
Calcium: 9.1 mg/dL (ref 8.4–10.5)
Chloride: 104 mEq/L (ref 96–112)
Creatinine, Ser: 0.58 mg/dL (ref 0.50–1.10)
GFR calc Af Amer: >60 mL/min (ref >60)
GFR calc non Af Amer: >60 mL/min (ref >60)
Glucose, Bld: 97 mg/dL (ref 70–99)
Potassium: 3.2 mEq/L — ABNORMAL LOW (ref 3.5–5.1)
Sodium: 139 mEq/L (ref 135–145)
Total Bilirubin: 0.9 mg/dL (ref 0.3–1.2)
Total Protein: 7.2 g/dL (ref 6.0–8.3)

## 2010-09-01 LAB — CBC
HCT: 38.8 % (ref 36.0–46.0)
Hemoglobin: 13.4 g/dL (ref 12.0–15.0)
MCH: 34.4 pg — ABNORMAL HIGH (ref 26.0–34.0)
MCHC: 34.5 g/dL (ref 30.0–36.0)
MCV: 99.5 fL (ref 78.0–100.0)
Platelets: 145 10*3/uL — ABNORMAL LOW (ref 150–400)
RBC: 3.9 MIL/uL (ref 3.87–5.11)
RDW: 12.7 % (ref 11.5–15.5)
WBC: 6.9 10*3/uL (ref 4.0–10.5)

## 2010-09-01 LAB — MAGNESIUM: Magnesium: 1.8 mg/dL (ref 1.5–2.5)

## 2010-09-01 LAB — T4, FREE: Free T4: 1.01 ng/dL (ref 0.80–1.80)

## 2010-09-01 LAB — T3, FREE: T3, Free: 2.2 pg/mL — ABNORMAL LOW (ref 2.3–4.2)

## 2010-09-02 ENCOUNTER — Other Ambulatory Visit: Payer: Self-pay

## 2010-09-02 DIAGNOSIS — I498 Other specified cardiac arrhythmias: Secondary | ICD-10-CM

## 2010-09-02 DIAGNOSIS — R55 Syncope and collapse: Secondary | ICD-10-CM

## 2010-09-02 LAB — BASIC METABOLIC PANEL
BUN: 7 mg/dL (ref 6–23)
CO2: 28 mEq/L (ref 19–32)
Calcium: 9.1 mg/dL (ref 8.4–10.5)
Chloride: 103 mEq/L (ref 96–112)
Creatinine, Ser: 0.58 mg/dL (ref 0.50–1.10)
GFR calc Af Amer: >60 mL/min (ref >60)
GFR calc non Af Amer: >60 mL/min (ref >60)
Glucose, Bld: 90 mg/dL (ref 70–99)
Potassium: 3.1 mEq/L — ABNORMAL LOW (ref 3.5–5.1)
Sodium: 139 mEq/L (ref 135–145)

## 2010-09-03 ENCOUNTER — Inpatient Hospital Stay (HOSPITAL_COMMUNITY): Payer: Medicare Other

## 2010-09-03 LAB — BASIC METABOLIC PANEL
BUN: 10 mg/dL (ref 6–23)
CO2: 29 mEq/L (ref 19–32)
Calcium: 9.3 mg/dL (ref 8.4–10.5)
Chloride: 103 mEq/L (ref 96–112)
Creatinine, Ser: 0.6 mg/dL (ref 0.50–1.10)
GFR calc Af Amer: >60 mL/min (ref >60)
GFR calc non Af Amer: >60 mL/min (ref >60)
Glucose, Bld: 94 mg/dL (ref 70–99)
Potassium: 3.2 mEq/L — ABNORMAL LOW (ref 3.5–5.1)
Sodium: 138 mEq/L (ref 135–145)

## 2010-09-03 LAB — CORTISOL: Cortisol, Plasma: 12.3 ug/dL

## 2010-09-03 MED ORDER — TECHNETIUM TC 99M MEDRONATE IV KIT
24.0000 | PACK | Freq: Once | INTRAVENOUS | Status: AC | PRN
  Administered 2010-09-03: 24 via INTRAVENOUS

## 2010-09-04 LAB — CBC
HCT: 35 % — ABNORMAL LOW (ref 36.0–46.0)
Hemoglobin: 12 g/dL (ref 12.0–15.0)
MCH: 34.3 pg — ABNORMAL HIGH (ref 26.0–34.0)
MCHC: 34.3 g/dL (ref 30.0–36.0)
MCV: 100 fL (ref 78.0–100.0)
Platelets: 151 10*3/uL (ref 150–400)
RBC: 3.5 MIL/uL — ABNORMAL LOW (ref 3.87–5.11)
RDW: 12.7 % (ref 11.5–15.5)
WBC: 5.3 10*3/uL (ref 4.0–10.5)

## 2010-09-04 LAB — BASIC METABOLIC PANEL
BUN: 22 mg/dL (ref 6–23)
CO2: 28 mEq/L (ref 19–32)
Calcium: 9.5 mg/dL (ref 8.4–10.5)
Chloride: 106 mEq/L (ref 96–112)
Creatinine, Ser: 0.7 mg/dL (ref 0.50–1.10)
GFR calc Af Amer: >60 mL/min (ref >60)
GFR calc non Af Amer: >60 mL/min (ref >60)
Glucose, Bld: 92 mg/dL (ref 70–99)
Potassium: 3.6 mEq/L (ref 3.5–5.1)
Sodium: 140 mEq/L (ref 135–145)

## 2010-09-06 NOTE — Discharge Summary (Signed)
NAMEBAILY, [name]                 ACCOUNT NO.:  [id number]  MEDICAL RECORD NO.:  [id number]  LOCATION:  1433                         FACILITY:  [facility]  PHYSICIAN:  Rosanna Randy, MDDATE OF BIRTH:  06-Jan-1932  DATE OF ADMISSION:  08/30/2010 DATE OF DISCHARGE:  09/04/2010                              DISCHARGE SUMMARY   PRIMARY CARE PHYSICIAN:  Lenon Curt. Chilton Si, M.D.  NEUROSURGEON:  Donalee Citrin, M.D., who will be following the patient in 1-2 weeks.  DISCHARGE DIAGNOSES: 1. Syncope episode secondary to orthostasis due to the use of     hydrochlorothiazide. 2. C7-T1 neck fracture, nondisplaced. 3. Transient atrioventricular block (Wenckebach type). 4. Hypothyroidism. 5. Underweight with a body mass index of 16.8 and protein/calorie     malnutrition. 6. Hypertension. 7. Depression/anxiety. 8. History of gastroesophageal reflux disease and peptic ulcer     disease. 9. History of chronic nausea. 10.Hypokalemia secondary to diuretics. 11.Mild dementia. 12.Physical deconditioning.  DISCHARGE MEDICATIONS: 1. Acetaminophen 650 mg by mouth every 6 hours as needed for pain. 2. Ensure 237 mL by mouth twice a day. 3. Levothyroxine 100 mcg by mouth daily before breakfast empty stomach     and 40 minutes away from other medications. 4. Protein supplement 30 mL by mouth daily. 5. Omeprazole 20 mg 1 capsule by mouth twice a day. 6. Ondansetron 8 mg oral dissolving tablet, one tablet by mouth every     4 hours as needed for nausea and vomiting. 7. Sertraline 100 mg 1 tablet by mouth daily. 8. Sucralfate 1 g 1 tablet by mouth 4 times a day.  The patient had     been instructed to stop using hydrochlorothiazide 25 mg by mouth     daily and also benazepril 10 mg by mouth daily.  DISPOSITION AND FOLLOWUP:  The patient is going to be discharged to [facility] for physical deconditioning in order to help the patient regain her strength back and for rehab.  She  will have an appointment with Dr. [name], Neurosurgery in 1-2 weeks in order to follow over her neck fracture and she is going to continue wearing her neck brace as instructed.  It is going to be also important to arrange a followup appointment and the patient's daughter received information about this with primary care physician, Dr. Murray Hodgkins.  During that appointment, it will be important to look over the patient's blood pressure since she is no longer using HCTZ in order to decide if she is going to require any other agent to control her blood pressure.  No new medications have been added during this acute orthostasis changes in order to allow the body to demonstrate what will be the new baseline of her blood pressure.  Medications that we favorable for is the use of Norvasc 5 mg by mouth daily.  The patient has been instructed to follow a low sodium diet.  Also due to the transient atrioventricular block Wenckebach type, on telemetry following recommendations by Cardiology Aricept has been discontinued.  If the patient requires to have a medication to help with her memory, Candiss Norse will be an option to consider.  We are going to let  primary care physician to look over these details and to just/start new medications for this patient if needed. The patient had been started on Ensure and also on protein supplement in order to help with the low BMI and protein/calorie malnutrition.  PROCEDURE PERFORMED:  During this hospitalization, the patient had a CT of the abdomen and pelvis on August 30, 2010, that demonstrated stable exam without evidence of visceral injury, hemoperitoneum or any other acute findings.  A CT of the head without contrast demonstrated no acute intracranial abnormality.  A CT of the cervical spine demonstrated a minimally C6-C7 anterior, inferior vertebral body endplate fracture. There is also a T1 mild compression fracture affecting the superior endplate.  A slight  retropulsion of 1 x 2 mm does not appear to adversely affect the spinal canal patency at this level.  There is no epidural hematoma evident.  There is no other acute fracture or listhesis identified in the cervical spine.  Left ribs and chest three view x-rayed on July 15 demonstrated no acute cardiopulmonary abnormalities.  No evidence for displaced or rib fracture.  A nuclear bone scan on July 17, demonstrated negative for evidence of neoplastic process of the bone.  There is an increased uptake at the cervicothoracic junction consistent with a known fracture seen on CT scan of the cervical spine.  No other x-rays were made.  The patient had a 2-D echo on September 01, 2010, that demonstrated ejection fraction of 60%- 65%.  The cavity size of the left ventricle was normal.  Normal systolic function.  No wall motion abnormalities.  No other procedures were performed.  CONSULTATIONS:  Cardiology and also Neurosurgery were consulted.  Please refer to their consultations notes for further details.  HISTORY OF PRESENT ILLNESS:  For full details, please refer to dictation done by Dr. Onalee Hua on August 30, 2010; but briefly this is a 77 year old female patient with a past medical history important for mild dementia, depression, hypothyroidism, hypertension, and also with a history of chronic gastroesophageal reflux disease and peptic ulcer disease, who comes into the hospital after experiencing a syncopal episode.  The patient was admitted for further evaluation and treatment, was found on CT of the head and cervical spine on admission that the patient have fracture of C6, C7, T1.  Pertinent laboratory data throughout this hospitalization includes a CBC with differential on admission with a white blood cells of 7.1, hemoglobin 13.2, and platelets 150.  Comprehensive metabolic panel with a sodium of 140, potassium 3.1, chloride 102, bicarb 28, glucose 92, BUN 18, and creatinine 0.75.  There is a  mild elevation of the AST at 42, most likely secondary to her trauma.  There is also mild decrease of the patient's albumin.  Cardiac markers negative x3.  Magnesium 1.9, phosphorus 3.3, TSH 6.284, hemoglobin A1c 5.3.  Lipid profile, total cholesterol 153, triglycerides 61, HDL of 68, LDL 73, free T4 1.01, T3 2.2.  Cortisol level 12.3 at discharge.  CBC showed a white blood cells of 5.3, hemoglobin 12.0, and platelet 151.  Basic metabolic panel with a sodium of 140, potassium 3.6, chloride 106, bicarb 28, glucose 92, BUN 22, creatinine 0.70, calcium 9.5.  HOSPITAL COURSE BY PROBLEM: 1. Syncope secondary to orthostasis due to the use of HCTZ.  The     patient did not have any abnormality of the heart structures seen     on the 2-D echo and no significant abnormality seen on telemetry     either.  She had  negative cardiac enzymes and once the HCTZ was     discontinued, the patient's orthostatic changes and lightheadedness     disappeared.  The patient is going to require as an outpatient     adjustment of blood pressure medications, specifically something     like Norvasc once a day.  We are going to allow primary care     physician to decide on it.  Due to transient abnormalities seen on     telemetry, she had discontinuation of her medication for dementia     Aricept and medication if needed to preserve memory, Namenda will     be an option for her.  At this point to try to improve the     patient's physical conditioning and rehab, she is going to go to     [facility] for short-term rehab placement. 2. C7-T1 fracture on her neck secondary to the fall that she     experienced during the syncope episode.  She had a neck brace and     is going to follow with Dr. [name], Neurosurgery 1-2 weeks.  Special     recommendation of no lifting, no driving, no sex had been given to     the patient by Neurosurgeon.  Tylenol for pain control. 3. Transient atrioventricular block.  We have discontinued  Aricept due     to these abnormalities and we are going to follow the patient as an     outpatient.  At this moment, no indication for pacemaker. 4. Hypothyroidism with a TSH that was elevated.  The patient to have     adjustment of her Synthroid medication.  She is going to use now     100 mcg of levothyroxine on daily basis. 5. Underweight with protein-calorie malnutrition, BMI 16.8.  We are     going to use Ensure and also protein supplements on daily basis.     The patient had been encouraged to increase p.o. intake. 6. Hypertension.  At this moment, hydrochlorothiazide has been     discontinued due to the orthostasis.  Blood pressure in the 140s to     150s range, but it is something that is going to be allowed during     this acute orthostatic changes as an outpatient.  She will require     a medication to control blood pressure, we favor the use of Norvasc     once a day. 7. History of gastroesophageal reflux disease and peptic ulcer     disease.  We are going to continue using omeprazole and sucralfate. 8. Hypokalemia secondary to diuretics.  Hydrochlorothiazide had been     now discontinued and the patient had repletion throughout this     admission of her electrolytes.  At discharge, potassium is in the     normal range. 9. History of chronic nausea.  She is going to continue following as     an outpatient by Dr. Leone Payor, and will continue using p.r.n.     ondansetron 8 mg every 4 hours and also PPIs. 10.Mild dementia due to Wenckebach abnormality cut on telemetry that     it was transient and possibly associated with pain, the final     recommendation by Cardiology was to discontinue Aricept that she     was using for.  If required, she might need to be placed on     Namenda.  We are going to allow primary care physician to discuss  with the patient and family members for the risk of new medication     and to start her if needed. 11.Depression.  We are going to continue  Zoloft. 12.Physical deconditioning as mentioned on #1.  The patient is going     to be discharged for short-term need placement at [facility] for     physical rehab.  DISCHARGE PHYSICAL EXAMINATION:  VITAL SIGNS:  Temperature 97.6, heart rate 62, respiratory rate 18, blood pressure 140/73, oxygen saturation 95% on room air. GENERAL:  The patient was in no acute distress. RESPIRATORY SYSTEM:  Clear to auscultation bilaterally. HEART:  Regular rate and rhythm.  No murmurs, gallops, or rubs. ABDOMEN:  Soft, nontender, nondistended with positive bowel sounds. EXTREMITIES:  There was no edema appreciated. NEUROLOGIC:  Exam was nonfocal.     Rosanna Randy, MD     CEM/MEDQ  D:  09/04/2010  T:  09/04/2010  Job:  [id number]  cc:   Lenon Curt. Chilton Si, M.D. Fax: [phone]  Donalee Citrin, M.D. Fax: [phone]  Electronically Signed by Vassie Loll MD on 09/06/2010 04:37:24 PM

## 2010-09-09 NOTE — Consult Note (Signed)
[name], RESSEL                 ACCOUNT NO.:  [id number]  MEDICAL RECORD NO.:  [id number]  LOCATION:  1433                         FACILITY:  [facility]  PHYSICIAN:  Gabrielle Pick. Eden Emms, MD, FACCDATE OF BIRTH:  02-01-1932  DATE OF CONSULTATION:  09/02/2010 DATE OF DISCHARGE:                                CONSULTATION   PRIMARY CARDIOLOGIST:  New patient to [facility].  PRIMARY CARE PROVIDER:  [name], MD  REASON FOR CONSULTATION:  Syncope and bradycardia.  HISTORY OF PRESENT ILLNESS:  This is a 77 year old Caucasian female without known coronary artery disease, history of hypertension and hypothyroidism, who presents with a syncopal episode and subsequent fracture of her C7 and T1.  Patient states she was standing at the sink several days ago when she began to feel lightheaded and a bright light came upon her.  Patient states she awoke on the floor and was able to crawl to the phone to call EMS.  Patient was in pain when she arrived in the emergency department.  A CT scan showed a small anterior chip fracture of C7 and a small posterior superior chip fracture of T1, both fractures are nondisplaced.  Patient was placed in a C-collar and will follow up with Dr. [name].  The patient was admitted to the telemetry unit at [facility].  She has been found to be in orthostatic.  Until today, patient's telemetry showed sinus rhythm to sinus bradycardia.  Patient was walking with physical therapy this morning when she began to complain of some neck pain and therefore was given pain medication.  At this time, patient became lethargic and difficult to arouse.  She was also cool and clammy and diaphoretic.  As noted, her heart rate was 32-37 with a blood pressure of 90/60.  Patient was given IV fluid bolus and her symptoms resolved.  Patient's heart rate is currently in the 50s.  Cardiology has been asked to see the patient for further evaluation.  Of note, patient's  troponins have been negative x3.  Her TSH was mildly elevated at 6.284.  Free T4 was 1.01 and free T3 2.2.  EKG this morning demonstrates sinus bradycardia with a PR interval of 202 milliseconds. No acute changes.  Echocardiogram showed normal LV function, EF 60% to 65%.  PAST MEDICAL HISTORY: 1. Chronic nausea. 2. Depression/anxiety. 3. Hypertension. 4. Hypothyroidism. 5. Cognitive impairment.  SOCIAL HISTORY:  Patient lives in [place] alone.  She has 2 daughters, who live nearby and check on her regularly.  She denies tobacco, alcohol or illicit drug use.  FAMILY HISTORY:  Noncontributory for premature coronary artery disease. Her mother passed away with ovarian cancer.  ALLERGIES:  Patient has an intolerance to IBUPROFEN secondary to GI issues.  INPATIENT MEDICATIONS: 1. Aricept 10 mg daily. 2. Ensure 237 mL twice daily. 3. Heparin 5000 units subcutaneously q.8 h. 4. Levothyroxine 75 mcg daily. 5. Protonix 40 mg twice daily. 6. Potassium 10 mEq IV x1. 7. Protein supplement 30 mL daily. 8. Zoloft 100 mg daily. 9. Carafate 1 g 4 times a day.  REVIEW OF SYSTEMS:  All pertinent positives and negatives as stated in HPI as well as chronic  nausea and neck pain.  Other systems have been reviewed are negative.  PHYSICAL EXAMINATION:  VITAL SIGNS:  Temperature 97.4, pulse 56-74, respiration 15.  Blood pressure 122-178 over 68-184, blood pressure lying down 161/79, blood pressure sitting 137/80, blood pressure standing 133/72.  O2 saturation 98% on room air. GENERAL:  This is a chronically ill-appearing elderly female.  She is in no acute distress. HEENT:  Normal with note of a C-collar in place. NECK:  Supple, bruit and JVD unable to obtain secondary to C-collar placement. HEART:  Regular rate and rhythm with S1 and S2.  No murmur, rub, or gallop noted.  Pulses are 2+ and equal bilaterally. LUNGS:  Clear to auscultation anteriorly without wheezes, rales,  or rhonchi. ABDOMEN:  Soft, nontender.  Positive bowel sounds x4. EXTREMITIES:  No clubbing, cyanosis, or edema. MUSCULOSKELETAL:  No joint deformities or effusions. NEUROLOGIC:  Alert and oriented x3.  Cranial nerves II through XII grossly intact.  DIAGNOSTIC STUDIES:  Chest x-ray showing no acute cardiopulmonary abnormalities.  No evidence of displaced rib fracture.  EKG showing sinus bradycardia at a rate of 56 beats per minute.  There are no acute ST-T wave changes.  PR interval is 202 milliseconds.  LABORATORY DATA:  WBC 6.9, hemoglobin 13.4, hematocrit 38.8, platelets 145,000.  Sodium 139, potassium 3.1, BUN 7, creatinine 0.58, magnesium 1.8, hemoglobin A1c 5.3, troponin negative x3.  ASSESSMENT AND PLAN:  This is a 77 year old Caucasian female without known coronary artery disease, who presents with syncope felt to be secondary to orthostatic hypotension as well as subsequent C7 and T1 nondisplaced fractures.  The data has been reviewed and currently there is no evidence of structural heart damage.  Patient's baseline electrocardiogram with borderline PR interval of 202 milliseconds. Patient's telemetry has been reviewed showing narrow complexes with supra-His bundle Wenckebach-type rhythm with pain.  With this being transient as well as above the His bundle, there is currently no indication for pacemaker implantation.  We would discontinue Aricept secondary to atrioventricular blocking.  With patient's elevated T3, would also increase Synthroid.  We will check a cortisol level in the morning.  If patient continues to have postural signs, then midodrine could be considered.  Thank you for this consultation.  We will continue to follow along with you.     Gabrielle Monarch, PA-C   ______________________________ Gabrielle Pick Eden Emms, MD, [facility]    NB/MEDQ  D:  09/02/2010  T:  09/02/2010  Job:  [id number]  Electronically Signed by Alen Blew P.A. on 09/03/2010 11:25:17  AM Electronically Signed by Charlton Haws MD FACC on 09/09/2010 10:18:12 PM

## 2010-09-12 ENCOUNTER — Other Ambulatory Visit: Payer: Self-pay | Admitting: Neurosurgery

## 2010-09-12 ENCOUNTER — Ambulatory Visit
Admission: RE | Admit: 2010-09-12 | Discharge: 2010-09-12 | Disposition: A | Discharge status: Home / Self Care | Payer: Medicare Other | Source: Ambulatory Visit | Attending: Neurosurgery | Admitting: Neurosurgery

## 2010-09-12 DIAGNOSIS — T148XXA Other injury of unspecified body region, initial encounter: Secondary | ICD-10-CM

## 2010-09-17 NOTE — H&P (Signed)
[name], [name] NO.:  [id number]  MEDICAL RECORD NO.:  [id number]  LOCATION:  1433                         FACILITY:  [facility]  PHYSICIAN:  Mariea Stable, MD   DATE OF BIRTH:  12-Mar-1931  DATE OF ADMISSION:  08/30/2010 DATE OF DISCHARGE:                             HISTORY & PHYSICAL   PRIMARY CARE PHYSICIAN:  [name], MD  GASTROENTEROLOGIST:  Iva Boop, MD, [facility]  CHIEF COMPLAINT:  Syncope.  HISTORY OF PRESENT ILLNESS:  [name] is a pleasant 77 year old woman with past medical history that is not very significant who presents to the emergency department with chief complaint of syncope.  She is currently somewhat lethargic due to pain medications received in the emergency department.  Per her, limited history as well as the granddaughter who is at bedside and a friend who is at bedside.  The patient woke up and got up out of bed approximately 10:30 to 11 o'clock this morning.  She reports having some chronic issues with nausea and not eating much.  She reports that this has been going on for the last couple months, however, just looking at the medical record, she saw Dr. Stan Head, [facility], in September 2007 with what seems to have been somewhat similar complaints.  From what they tell me, she has had a thorough workup and actually has an appointment to have her gallbladder checked out on Monday.  Nonetheless, she was standing by the sink at approximately 11 in the morning, as mentioned above, when she felt some visual changes, described as white light, dizziness, and nausea.  At that point, the patient collapsed and reports waking up "not for long".  She states that it could not have been more than 1-3 minutes of loss of consciousness.  Again, the patient lives alone and upon waking she crawled to the phone and called EMS.  Upon evaluation in the emergency department, initial workup was negative except for a  CK of 265 and CK-MB of 10.6, but a negative troponin of less than 0.3, however, CT of the C-spine demonstrated a minimally displaced C7 endplate fracture as well as the T1 mild compression fracture.  At that time, Neurosurgery was called, but they asked Medicine to admit for diagnosis of syncope.  PAST MEDICAL HISTORY: 1. Chronic nausea. 2. Depression/anxiety. 3. Cognitive impairment. 4. Hypertension. 5. Hypothyroidism.  MEDICATIONS: 1. Omeprazole 20 mg p.o. twice a day. 2. Sucralfate 1 g p.o. 4 times daily. 3. Sertraline 100 mg p.o. daily. 4. Aricept 10 mg p.o. daily. 5. Hydrochlorothiazide 25 mg p.o. daily. 6. Zofran 8 mg p.o. t.i.d. p.r.n. nausea. 7. Levoxyl 75 mcg p.o. daily.  ALLERGIES:  IBUPROFEN, not a true allergy, but she had been instructed not to take due to her stomach issues.  SOCIAL HISTORY:  The patient lives alone.  She has 2 daughters that live nearby, one of which works in the [employer] (phone # [phone]).  The other daughter's phone [phone].  The patient denies any substance use including tobacco, alcohol, or drugs.  FAMILY HISTORY:  Noncontributory.  REVIEW OF SYSTEMS:  No chest pain or palpitations.  PHYSICAL EXAMINATION:  VITAL SIGNS:  Temperature 97.8, blood pressure 155/71, heart rate is 60, respirations 20, oxygen saturation 95% to 99% on room air. GENERAL:  This is an older-appearing and slightly frail-appearing woman that is lying in bed who is quite pleasant, in no acute distress.  She is actually slightly lethargic secondary to pain medications given because of neck pain. HEENT:  Head is normocephalic, atraumatic.  Pupils equally round and reactive to light.  Extraocular movements are intact.  Sclerae anicteric.  Mucous membranes are dry.  There are no oropharyngeal lesions. NECK:  Has C collar in place. CHEST:  Lungs are clear to auscultation bilaterally with good air movement anterolaterally. HEART:   S1 and S2, normal with a regular rate and rhythm.  No murmurs, gallops, or rubs identified. ABDOMEN:  Positive bowel sounds, soft, nontender, nondistended. EXTREMITIES:  There is no cyanosis, clubbing, or edema. NEUROLOGIC:  Patient is lethargic after receiving pain medication, but is alert and oriented overall.  Cranial nerves appear to be grossly intact.  Motor appears to be 5/5 in all 4 extremities and sensation is grossly intact.  LABORATORY DATA:  WBC 7.1, hemoglobin 13.2, platelets are 150.  Sodium 140, potassium 3.1, chloride 102, bicarbonate 28, BUN 18, creatinine 0.75, glucose 192.  Total bilirubin is 0.9, alkaline phosphatase is 67, AST 42, ALT 34, total protein 6.7, albumin 3.9, calcium 9.3.  Urinalysis is negative.  CK 265, CK-MB 10.6, relative index of 4 with a troponin of less than 0.3.  IMAGING: 1. CT of the head without contrast shows no acute intracranial     normalities, but advanced chronic white matter disease. 2. CT scan of the C spine without contrast - impression,     a.     Minimally displaced C7 anterior-inferior vertebral body      endplate fracture.     b.     T1 mild compression fracture affecting the superior      endplate.  Slight retropulsion of bone by 2 mm that does not      appear to adversely affect the spinal canal patency at this level.      No epidural hematoma evident.     c.     No other acute fractures or listhesis identified in the      cervical spine. 3. CT abdomen and pelvis - impression, stable exam, no evidence of     visceral injury, hemoperitoneum, or other acute findings.  ASSESSMENT AND PLAN: 1. Syncope.  It is unclear as to the etiology at this point, however,     based on the patient's reported symptoms of dizziness upon standing     associated with the nausea which is amplified at this time, there     is concern for orthostatic hypotension.  The patient is elderly and     is on hydrochlorothiazide for her blood pressure.   Currently, there     is no evidence to suggest a more concerning etiology.  This     includes negative blood work including cardiac enzymes, CT of the     head that does not show any acute abnormality.  At this time, we     will go ahead and admit to Telemetry to monitor for arrhythmias,     although I think this is unlikely.  We will also cycle cardiac     enzymes to ensure that there has not been an acute coronary     syndrome.  Currently, there are no gross  electrolyte abnormalities     aside from some mild hypokalemia to predispose to arrhythmias.     Furthermore, BUN and creatinine ratios are within normal limits,     making dehydration as the exacerbating possibility of her     orthostatic hypotension less likely. 2. C7/T1 fracture.  This will be managed by Neurosurgery with     collar/brace.  We will order p.r.n. morphine at a low-dose and hold     for sedation.  We will follow up further recommendations from that. 3. Hypothyroidism.  We will check a TSH and continue with Levoxyl at     home dose. 4. Elevated CK/CK-MB.  This is likely secondary to her fall.  Again     given syncope, we will go ahead and cycle cardiac enzymes to assess     that there has not been an acute coronary syndrome.  If these     become positive, the patient will need to be continued on aspirin     and started on a heparin drip with a cardiology consultation. 5. Hypokalemia.  This is mild and likely secondary to     hydrochlorothiazide plus or minus hyperaldo state, in the setting     of nausea and decreased oral intake.  I will go ahead and hold     hydrochlorothiazide and replete potassium.  We will go ahead and     check magnesium and phosphorous to ensure that these are     contributing factors.  CODE STATUS:  The patient is a full code at this point.  As mentioned above, if need for medical decision making arises either of her daughters should be contacted.     Mariea Stable,  MD     MA/MEDQ  D:  08/30/2010  T:  08/31/2010  Job:  [id number]  cc:   Iva Boop, MD,FACG [facility] [address] [place], [place] [address]  [name], MD [address] [place] [address]  Electronically Signed by Mariea Stable MD on 09/17/2010 03:32:13 PM

## 2010-11-12 LAB — URINALYSIS, ROUTINE W REFLEX MICROSCOPIC
Bilirubin Urine: NEGATIVE
Glucose, UA: NEGATIVE
Hgb urine dipstick: NEGATIVE
Ketones, ur: NEGATIVE
Nitrite: NEGATIVE
Protein, ur: NEGATIVE
Specific Gravity, Urine: 1.009
Urobilinogen, UA: 0.2
pH: 7.5

## 2010-11-12 LAB — POCT CARDIAC MARKERS
CKMB, poc: <1 — ABNORMAL LOW
CKMB, poc: <1 — ABNORMAL LOW
Myoglobin, poc: 60.5
Myoglobin, poc: 62.8
Operator id: 288331
Operator id: 288331
Troponin i, poc: <0.05
Troponin i, poc: <0.05

## 2010-11-12 LAB — COMPREHENSIVE METABOLIC PANEL
ALT: 15
AST: 26
Albumin: 4
Alkaline Phosphatase: 71
BUN: 14
CO2: 27
Calcium: 9.4
Chloride: 100
Creatinine, Ser: 0.83
GFR calc Af Amer: >60
GFR calc non Af Amer: >60
Glucose, Bld: 101 — ABNORMAL HIGH
Potassium: 3.2 — ABNORMAL LOW
Sodium: 139
Total Bilirubin: 0.6
Total Protein: 6.9

## 2010-11-12 LAB — CBC
HCT: 42
Hemoglobin: 14.3
MCHC: 34.1
MCV: 100.4 — ABNORMAL HIGH
Platelets: 197
RBC: 4.18
RDW: 12.4
WBC: 5

## 2010-11-12 LAB — DIFFERENTIAL
Basophils Absolute: 0
Basophils Relative: 1
Eosinophils Absolute: 0
Eosinophils Relative: 1
Lymphocytes Relative: 39
Lymphs Abs: 1.9
Monocytes Absolute: 0.7
Monocytes Relative: 14 — ABNORMAL HIGH
Neutro Abs: 2.3
Neutrophils Relative %: 46

## 2011-02-18 HISTORY — PX: WRIST SURGERY: SHX841

## 2011-09-25 ENCOUNTER — Other Ambulatory Visit: Payer: Self-pay | Admitting: Internal Medicine

## 2011-09-25 DIAGNOSIS — IMO0002 Reserved for concepts with insufficient information to code with codable children: Secondary | ICD-10-CM

## 2011-09-25 DIAGNOSIS — R413 Other amnesia: Secondary | ICD-10-CM

## 2011-09-25 DIAGNOSIS — R410 Disorientation, unspecified: Secondary | ICD-10-CM

## 2011-09-27 ENCOUNTER — Ambulatory Visit
Admission: RE | Admit: 2011-09-27 | Discharge: 2011-09-27 | Disposition: A | Discharge status: Home / Self Care | Payer: Medicare Other | Source: Ambulatory Visit | Attending: Internal Medicine | Admitting: Internal Medicine

## 2011-09-27 ENCOUNTER — Other Ambulatory Visit: Payer: Self-pay | Admitting: Internal Medicine

## 2011-09-27 DIAGNOSIS — R410 Disorientation, unspecified: Secondary | ICD-10-CM

## 2011-09-27 DIAGNOSIS — R413 Other amnesia: Secondary | ICD-10-CM

## 2011-09-27 DIAGNOSIS — IMO0002 Reserved for concepts with insufficient information to code with codable children: Secondary | ICD-10-CM

## 2011-10-06 ENCOUNTER — Other Ambulatory Visit: Payer: Self-pay | Admitting: Internal Medicine

## 2011-10-06 ENCOUNTER — Ambulatory Visit
Admission: RE | Admit: 2011-10-06 | Discharge: 2011-10-06 | Disposition: A | Discharge status: Home / Self Care | Payer: Medicare Other | Source: Ambulatory Visit | Attending: Internal Medicine | Admitting: Internal Medicine

## 2011-10-06 DIAGNOSIS — G8929 Other chronic pain: Secondary | ICD-10-CM

## 2012-06-25 ENCOUNTER — Other Ambulatory Visit: Payer: Self-pay | Admitting: *Deleted

## 2012-06-25 DIAGNOSIS — E039 Hypothyroidism, unspecified: Secondary | ICD-10-CM

## 2012-06-25 DIAGNOSIS — I10 Essential (primary) hypertension: Secondary | ICD-10-CM

## 2012-07-01 ENCOUNTER — Other Ambulatory Visit: Payer: Self-pay | Admitting: Nurse Practitioner

## 2012-08-23 ENCOUNTER — Other Ambulatory Visit: Payer: Self-pay

## 2012-09-07 ENCOUNTER — Emergency Department (HOSPITAL_COMMUNITY)
Admission: EM | Admit: 2012-09-07 | Discharge: 2012-09-07 | Disposition: A | Discharge status: Home / Self Care | Payer: Medicare Other | Attending: Emergency Medicine | Admitting: Emergency Medicine

## 2012-09-07 ENCOUNTER — Emergency Department (HOSPITAL_COMMUNITY): Payer: Medicare Other

## 2012-09-07 ENCOUNTER — Encounter (HOSPITAL_COMMUNITY): Payer: Self-pay | Admitting: Cardiology

## 2012-09-07 DIAGNOSIS — S0001XA Abrasion of scalp, initial encounter: Secondary | ICD-10-CM

## 2012-09-07 DIAGNOSIS — Y929 Unspecified place or not applicable: Secondary | ICD-10-CM | POA: Insufficient documentation

## 2012-09-07 DIAGNOSIS — F0390 Unspecified dementia without behavioral disturbance: Secondary | ICD-10-CM | POA: Insufficient documentation

## 2012-09-07 DIAGNOSIS — S0993XA Unspecified injury of face, initial encounter: Secondary | ICD-10-CM | POA: Insufficient documentation

## 2012-09-07 DIAGNOSIS — IMO0002 Reserved for concepts with insufficient information to code with codable children: Secondary | ICD-10-CM | POA: Insufficient documentation

## 2012-09-07 DIAGNOSIS — Y939 Activity, unspecified: Secondary | ICD-10-CM | POA: Insufficient documentation

## 2012-09-07 DIAGNOSIS — S52501A Unspecified fracture of the lower end of right radius, initial encounter for closed fracture: Secondary | ICD-10-CM

## 2012-09-07 DIAGNOSIS — Z79899 Other long term (current) drug therapy: Secondary | ICD-10-CM | POA: Insufficient documentation

## 2012-09-07 DIAGNOSIS — W19XXXA Unspecified fall, initial encounter: Secondary | ICD-10-CM | POA: Insufficient documentation

## 2012-09-07 DIAGNOSIS — K219 Gastro-esophageal reflux disease without esophagitis: Secondary | ICD-10-CM | POA: Insufficient documentation

## 2012-09-07 DIAGNOSIS — I10 Essential (primary) hypertension: Secondary | ICD-10-CM | POA: Insufficient documentation

## 2012-09-07 DIAGNOSIS — S52599A Other fractures of lower end of unspecified radius, initial encounter for closed fracture: Secondary | ICD-10-CM | POA: Insufficient documentation

## 2012-09-07 DIAGNOSIS — E079 Disorder of thyroid, unspecified: Secondary | ICD-10-CM | POA: Insufficient documentation

## 2012-09-07 HISTORY — DX: Gastro-esophageal reflux disease without esophagitis: K21.9

## 2012-09-07 HISTORY — DX: Essential (primary) hypertension: I10

## 2012-09-07 MED ORDER — FENTANYL CITRATE 0.05 MG/ML IJ SOLN
25.0000 ug | Freq: Once | INTRAMUSCULAR | Status: AC
  Administered 2012-09-07: 25 ug via INTRAVENOUS
  Filled 2012-09-07: qty 2

## 2012-09-07 NOTE — ED Notes (Signed)
Ortho paged. 

## 2012-09-07 NOTE — ED Notes (Signed)
Ortho tech at the bedside.  

## 2012-09-07 NOTE — ED Notes (Signed)
Resident at the bedside to place staples.

## 2012-09-07 NOTE — Progress Notes (Signed)
Orthopedic Tech Progress Note Patient Details:  [name] 1931-07-28 [id number]  Ortho Devices Type of Ortho Device: Ace wrap;Sugartong splint;Arm sling Ortho Device/Splint Location: RUE Ortho Device/Splint Interventions: Ordered;Application   Jennye Moccasin 09/07/2012, 6:32 PM

## 2012-09-07 NOTE — ED Provider Notes (Signed)
History    CSN: [id number] Arrival date & time 09/07/12  1508  First MD Initiated Contact with Patient 09/07/12 1511     Chief Complaint  Patient presents with  . Fall   (Consider location/radiation/quality/duration/timing/severity/associated sxs/prior Treatment) HPI Comments: [name] 79 y.o. With history of GERD, HTN, thyroid dz and dementia presents after a fall, as described below. No lightheadedness, dizziness, chest pain, visual changes, or shortness of breath before or after the fall. She explains that she fell due to "the chair swiveling".   Patient is a 79 y.o. female presenting with fall. The history is provided by the patient.  Fall This is a new (Patient fell while standing on a swivelling chair with wheels) problem. The current episode started today. The problem occurs rarely (She fell once today). The problem has been unchanged. Associated symptoms include arthralgias (Right wrist, right hip), headaches and neck pain. Pertinent negatives include no abdominal pain, anorexia, chest pain, chills, coughing, diaphoresis, fatigue, fever, myalgias, nausea, numbness, sore throat, urinary symptoms, vertigo, visual change, vomiting or weakness. The symptoms are aggravated by bending. She has tried nothing for the symptoms.   Past Medical History  Diagnosis Date  . GERD (gastroesophageal reflux disease)   . Hypertension   . Thyroid disease    History reviewed. No pertinent past surgical history. History reviewed. No pertinent family history. History  Substance Use Topics  . Smoking status: Not on file  . Smokeless tobacco: Not on file  . Alcohol Use: Not on file   OB History   Grav Para Term Preterm Abortions TAB SAB Ect Mult Living                 Review of Systems  Constitutional: Negative for fever, chills, diaphoresis, activity change, appetite change and fatigue.  HENT: Positive for neck pain. Negative for sore throat, rhinorrhea, sneezing, drooling and trouble  swallowing.   Eyes: Negative for discharge and redness.  Respiratory: Negative for cough, chest tightness, shortness of breath, wheezing and stridor.   Cardiovascular: Negative for chest pain and leg swelling.  Gastrointestinal: Negative for nausea, vomiting, abdominal pain, diarrhea, constipation, blood in stool and anorexia.  Genitourinary: Negative for difficulty urinating.  Musculoskeletal: Positive for arthralgias (Right wrist, right hip). Negative for myalgias.  Skin: Negative for pallor.  Neurological: Positive for headaches. Negative for dizziness, vertigo, syncope, speech difficulty, weakness, light-headedness and numbness.  Hematological: Negative for adenopathy. Does not bruise/bleed easily.  Psychiatric/Behavioral: Negative for confusion and agitation. The patient is nervous/anxious.     Allergies  Review of patient's allergies indicates no known allergies.  Home Medications   Current Outpatient Rx  Name  Route  Sig  Dispense  Refill  . DULoxetine (CYMBALTA) 30 MG capsule   Oral   Take 30 mg by mouth daily.         Marland Kitchen levothyroxine (SYNTHROID, LEVOTHROID) 88 MCG tablet   Oral   Take 88 mcg by mouth daily before breakfast.         . losartan (COZAAR) 100 MG tablet   Oral   Take 100 mg by mouth daily.         . memantine (NAMENDA) 10 MG tablet   Oral   Take 10 mg by mouth 2 (two) times daily.         . mirtazapine (REMERON) 15 MG tablet   Oral   Take 15 mg by mouth every evening.          Marland Kitchen omeprazole (PRILOSEC)  20 MG capsule   Oral   Take 20 mg by mouth daily.         . sucralfate (CARAFATE) 1 G tablet   Oral   Take 1 g by mouth 4 (four) times daily.          BP 187/74  Pulse 80  Temp(Src) 98.2 F (36.8 C) (Oral)  Resp 18  SpO2 98% Physical Exam  Constitutional: She appears well-developed and well-nourished. No distress.  HENT:  Head: Normocephalic. Head is with laceration (small avulsion on the right parietal scale). Head is without  raccoon's eyes, without Battle's sign, without right periorbital erythema and without left periorbital erythema.  Right Ear: External ear normal. No hemotympanum.  Left Ear: External ear normal. No hemotympanum.  Nose: Nose normal.  Eyes: Conjunctivae and EOM are normal. Right eye exhibits no discharge. Left eye exhibits no discharge.  Neck: Normal range of motion. Neck supple. No JVD present.  Cardiovascular: Normal rate, regular rhythm and normal heart sounds.  Exam reveals no gallop and no friction rub.   No murmur heard. Pulmonary/Chest: Effort normal and breath sounds normal. No stridor. No respiratory distress. She has no wheezes. She has no rales. She exhibits no tenderness.  Abdominal: Soft. Bowel sounds are normal. She exhibits no distension. There is no tenderness. There is no rebound and no guarding.  Musculoskeletal: She exhibits no edema.       Right shoulder: She exhibits normal range of motion, no tenderness and no bony tenderness.       Left shoulder: She exhibits normal range of motion, no tenderness and no bony tenderness.       Right elbow: She exhibits normal range of motion, no swelling and no effusion.       Left elbow: She exhibits normal range of motion, no swelling and no effusion.       Right wrist: She exhibits decreased range of motion, bony tenderness and swelling.       Left wrist: She exhibits normal range of motion, no tenderness and no bony tenderness.       Right hip: She exhibits normal range of motion, normal strength and no tenderness.       Left hip: She exhibits normal range of motion, normal strength and no tenderness.       Right knee: She exhibits normal range of motion, no swelling and no effusion.       Left knee: She exhibits normal range of motion, no swelling and no effusion.       Cervical back: She exhibits bony tenderness (Midline, cephalad portion of the C-spine). She exhibits no laceration and no spasm.       Thoracic back: She exhibits normal  range of motion, no tenderness and no bony tenderness.       Lumbar back: She exhibits normal range of motion, no tenderness and no bony tenderness.       Right foot: She exhibits normal range of motion, no tenderness and no bony tenderness.       Left foot: She exhibits normal range of motion, no tenderness and no bony tenderness.  Neurological: She is alert. No cranial nerve deficit (Cranial nerves III through XII grossly intact bilateral).  Alert, oriented to person, place, and city. Patient is not oriented to time.  Skin: Skin is warm. No rash noted. She is not diaphoretic.  Psychiatric: She has a normal mood and affect. Her behavior is normal.    ED Course  Procedures (including  critical care time) Labs Reviewed - No data to display Dg Wrist Complete Right  09/07/2012   *RADIOLOGY REPORT*  Clinical Data: Fall with right wrist pain.  RIGHT WRIST - COMPLETE 3+ VIEW  Comparison: None.  Findings: Comminuted and mildly displaced fracture of the distal radius is noted.  There is mild dorsal angulation in the lateral projection.  No other fractures are identified.  IMPRESSION: Comminuted distal radial fracture demonstrating mild displacement and angulation.   Original Report Authenticated By: Irish Lack, M.D.   Dg Hip Complete Right  09/07/2012   *RADIOLOGY REPORT*  Clinical Data: Fall with right hip pain.  RIGHT HIP - COMPLETE 2+ VIEW  Comparison: None.  Findings: No acute fracture or dislocation is identified.  Bony pelvis is intact.  No soft tissue abnormalities are seen.  IMPRESSION: No acute fracture identified.   Original Report Authenticated By: Irish Lack, M.D.   Ct Head Wo Contrast  09/07/2012   *RADIOLOGY REPORT*  Clinical Data:  History of trauma from a fall.  Head and neck pain.  CT HEAD WITHOUT CONTRAST CT CERVICAL SPINE WITHOUT CONTRAST  Technique:  Multidetector CT imaging of the head and cervical spine was performed following the standard protocol without intravenous  contrast.  Multiplanar CT image reconstructions of the cervical spine were also generated.  Comparison:  Brain MRI 09/27/2011.  Head and cervical spine CT scan 08/30/2010.  CT HEAD  Findings: Again noted is a small extra-axial ovoid shaped lesion in the periphery of the left posterior cranial fossa measuring 1.2 cm in diameter, which is high attenuation (likely partially calcified), compatible with a known meningioma.  This exerts only some mild local mass effect.  Faint physiologic calcifications in the basal ganglia bilaterally (left greater than right).  Mild cerebral atrophy.  Patchy and confluent areas of decreased attenuation throughout the deep and periventricular white matter of the cerebral hemispheres bilaterally, compatible with chronic microvascular ischemic disease.  No acute displaced skull fractures.  No acute intracranial abnormality.  Specifically, no signs of acute post-traumatic intracranial hemorrhage.  No acute displaced skull fractures are identified.  Visualized paranasal sinuses and mastoids are well pneumatized.  IMPRESSION: 1.  No evidence of acute displaced skull fracture or significant acute traumatic injury to the brain. 2.  Small extra-axial lesion in the left posterior cranial fossa is unchanged and compatible with a meningioma. 3.  Mild cerebral atrophy with chronic microvascular ischemic changes in the cerebral white matter.  CT CERVICAL SPINE  Findings: No acute displaced fracture of the cervical spine is noted.  Alignment is anatomic.  Prevertebral soft tissues are normal.  There is multilevel degenerative disc disease, most severe at C3-C4, C5-C6 and C7-T1.  Multilevel facet arthropathy is also noted.  Visualized portions of the lung apices demonstrate mild bilateral apical pleuroparenchymal thickening which likely reflects chronic scarring.  IMPRESSION: 1.  No evidence of significant acute traumatic injury to the cervical spine. 2.  Multilevel degenerative disc disease and  cervical spondylosis again noted, as above.   Original Report Authenticated By: Trudie Reed, M.D.   Ct Cervical Spine Wo Contrast  09/07/2012   *RADIOLOGY REPORT*  Clinical Data:  History of trauma from a fall.  Head and neck pain.  CT HEAD WITHOUT CONTRAST CT CERVICAL SPINE WITHOUT CONTRAST  Technique:  Multidetector CT imaging of the head and cervical spine was performed following the standard protocol without intravenous contrast.  Multiplanar CT image reconstructions of the cervical spine were also generated.  Comparison:  Brain MRI 09/27/2011.  Head and cervical spine CT scan 08/30/2010.  CT HEAD  Findings: Again noted is a small extra-axial ovoid shaped lesion in the periphery of the left posterior cranial fossa measuring 1.2 cm in diameter, which is high attenuation (likely partially calcified), compatible with a known meningioma.  This exerts only some mild local mass effect.  Faint physiologic calcifications in the basal ganglia bilaterally (left greater than right).  Mild cerebral atrophy.  Patchy and confluent areas of decreased attenuation throughout the deep and periventricular white matter of the cerebral hemispheres bilaterally, compatible with chronic microvascular ischemic disease.  No acute displaced skull fractures.  No acute intracranial abnormality.  Specifically, no signs of acute post-traumatic intracranial hemorrhage.  No acute displaced skull fractures are identified.  Visualized paranasal sinuses and mastoids are well pneumatized.  IMPRESSION: 1.  No evidence of acute displaced skull fracture or significant acute traumatic injury to the brain. 2.  Small extra-axial lesion in the left posterior cranial fossa is unchanged and compatible with a meningioma. 3.  Mild cerebral atrophy with chronic microvascular ischemic changes in the cerebral white matter.  CT CERVICAL SPINE  Findings: No acute displaced fracture of the cervical spine is noted.  Alignment is anatomic.  Prevertebral soft  tissues are normal.  There is multilevel degenerative disc disease, most severe at C3-C4, C5-C6 and C7-T1.  Multilevel facet arthropathy is also noted.  Visualized portions of the lung apices demonstrate mild bilateral apical pleuroparenchymal thickening which likely reflects chronic scarring.  IMPRESSION: 1.  No evidence of significant acute traumatic injury to the cervical spine. 2.  Multilevel degenerative disc disease and cervical spondylosis again noted, as above.   Original Report Authenticated By: Trudie Reed, M.D.   1. Fall, initial encounter   2. Fracture of right distal radius, closed, initial encounter   3. Scalp abrasion, initial encounter    .this MDM  Randal Buba 79 y.o. presents after a mechanical fall as described above. No loss of consciousness the patient does report amnesia. Afebrile vital signs stable. She is alert and oriented x3. She complains of right wrist pain and posterior scalp pain. Patient moving all 4 extremities freely involuntarily. There is a small holes in on the right sided parietal scalp. This does not appear to be amenable to primary repair. No focal deficits on exam. Right-sided wrist pain with swelling. Right upper extremity is warm and well-perfused. Superficial sensation intact in the right hand. Midline pain at the most cephalad portion of the C-spine. No other midline tenderness to palpation. Strength intact in the bilateral lower extremities. There is right sided iliac crest tenderness palpation, but no gross deformity of the hips. Patient is not on any blood thinners or antiplatelet agents. CT head showed no acute intracranial abnormality. CT C-spine showed no fracture or malalignment. X-ray of the right hip negative for fracture or malalignment. There is a right distal radius fracture with minimal angulation and displacement. Patient was placed in a sugar tong splint and will followup with orthopedic surgery. She was given strong return precautions  including headache, mental status changes, or any other alarming or concerning symptoms or issues. On repeat exam of patient's C-spine there is no midline tenderness to palpation, and patient can freely flex and extend her neck and laterally rotate her head without pain. C-collar was removed. Patient will have 24-hour assistance at home, per the patient and her granddaughter.  Imaging reviewed. I discussed this patient's care with my attending, Dr. Bernette Mayers.  Sena Hitch, MD 09/07/12 [phone]

## 2012-09-07 NOTE — ED Notes (Signed)
Pt up to the bedside commode without any problems.

## 2012-09-07 NOTE — ED Notes (Addendum)
Pt to department via EMS- pt reports that she was standing on a stool about 8 inches high and fell off. Pt with pain to the right wrist. Pt reports she hit the back of her head, laceration to back of the head. Pt reports a headache. Pupils reactive. BP- 175/93 Hr-68 RR-18 20g LAC fentanyl. Arrived in a KED and c-collar.

## 2012-09-07 NOTE — ED Provider Notes (Signed)
I saw and evaluated the patient, reviewed the resident's note and I agree with the findings and plan.  Pt with mechanical fall, has scalp abrasion/laceration may need repair to help with continued oozing. R wrist fracture will need splint and Hand followup.   Osa Fogarty B. Bernette Mayers, MD 09/07/12 [place]

## 2012-09-07 NOTE — ED Notes (Signed)
There is a small avulsion on the right parietal scalp. No active bleeding. No staples. Dry dressing and curlex applied. Patient AAOx4, resp e/u, NAD at this time. Patient is without any complaints of pain. Sitting in room watching TV with granddaughter, wanting to be discharged home. Currently waiting on dispo by MD.

## 2012-09-15 ENCOUNTER — Other Ambulatory Visit: Payer: Self-pay | Admitting: Geriatric Medicine

## 2012-09-15 ENCOUNTER — Other Ambulatory Visit: Payer: Self-pay | Admitting: Nurse Practitioner

## 2012-09-23 ENCOUNTER — Other Ambulatory Visit: Payer: Self-pay

## 2012-09-24 ENCOUNTER — Other Ambulatory Visit: Payer: Self-pay | Admitting: Internal Medicine

## 2012-09-28 ENCOUNTER — Other Ambulatory Visit: Payer: Medicare Other

## 2012-09-28 DIAGNOSIS — E039 Hypothyroidism, unspecified: Secondary | ICD-10-CM

## 2012-09-28 DIAGNOSIS — I10 Essential (primary) hypertension: Secondary | ICD-10-CM

## 2012-09-29 LAB — COMPREHENSIVE METABOLIC PANEL
ALT: 14 IU/L (ref 0–32)
AST: 24 IU/L (ref 0–40)
Albumin/Globulin Ratio: 1.7 (ref 1.1–2.5)
Albumin: 4.2 g/dL (ref 3.5–4.8)
Alkaline Phosphatase: 102 IU/L (ref 39–117)
BUN/Creatinine Ratio: 25 (ref 11–26)
BUN: 26 mg/dL (ref 8–27)
CO2: 27 mmol/L (ref 18–29)
Calcium: 9.7 mg/dL (ref 8.6–10.2)
Chloride: 104 mmol/L (ref 97–108)
Creatinine, Ser: 1.06 mg/dL — ABNORMAL HIGH (ref 0.57–1.00)
GFR calc Af Amer: 58 mL/min/{1.73_m2} — ABNORMAL LOW (ref >59)
GFR calc non Af Amer: 50 mL/min/{1.73_m2} — ABNORMAL LOW (ref >59)
Globulin, Total: 2.5 g/dL (ref 1.5–4.5)
Glucose: 96 mg/dL (ref 65–99)
Potassium: 4.5 mmol/L (ref 3.5–5.2)
Sodium: 144 mmol/L (ref 134–144)
Total Bilirubin: 0.4 mg/dL (ref 0.0–1.2)
Total Protein: 6.7 g/dL (ref 6.0–8.5)

## 2012-09-29 LAB — TSH: TSH: 1.78 u[IU]/mL (ref 0.450–4.500)

## 2012-10-06 ENCOUNTER — Encounter: Payer: Self-pay | Admitting: Internal Medicine

## 2012-10-06 ENCOUNTER — Encounter: Payer: Self-pay | Admitting: *Deleted

## 2012-10-06 ENCOUNTER — Ambulatory Visit (INDEPENDENT_AMBULATORY_CARE_PROVIDER_SITE_OTHER): Payer: Medicare Other | Admitting: Internal Medicine

## 2012-10-06 VITALS — BP 142/80 | HR 72 | Temp 97.9°F | Resp 18 | Wt 131.2 lb

## 2012-10-06 DIAGNOSIS — F0280 Dementia in other diseases classified elsewhere without behavioral disturbance: Secondary | ICD-10-CM

## 2012-10-06 DIAGNOSIS — F329 Major depressive disorder, single episode, unspecified: Secondary | ICD-10-CM | POA: Insufficient documentation

## 2012-10-06 DIAGNOSIS — R609 Edema, unspecified: Secondary | ICD-10-CM

## 2012-10-06 DIAGNOSIS — G4700 Insomnia, unspecified: Secondary | ICD-10-CM | POA: Insufficient documentation

## 2012-10-06 DIAGNOSIS — E079 Disorder of thyroid, unspecified: Secondary | ICD-10-CM

## 2012-10-06 DIAGNOSIS — I10 Essential (primary) hypertension: Secondary | ICD-10-CM

## 2012-10-06 DIAGNOSIS — F0391 Unspecified dementia with behavioral disturbance: Secondary | ICD-10-CM | POA: Insufficient documentation

## 2012-10-06 DIAGNOSIS — F03918 Unspecified dementia, unspecified severity, with other behavioral disturbance: Secondary | ICD-10-CM | POA: Insufficient documentation

## 2012-10-06 DIAGNOSIS — E559 Vitamin D deficiency, unspecified: Secondary | ICD-10-CM

## 2012-10-06 DIAGNOSIS — K219 Gastro-esophageal reflux disease without esophagitis: Secondary | ICD-10-CM

## 2012-10-06 MED ORDER — OMEPRAZOLE 20 MG PO CPDR: DELAYED_RELEASE_CAPSULE | ORAL | Status: DC

## 2012-10-06 MED ORDER — LEVOTHYROXINE SODIUM 88 MCG PO TABS: 88.0000 ug | ORAL_TABLET | Freq: Every day | ORAL | Status: DC

## 2012-10-06 MED ORDER — LOSARTAN POTASSIUM 100 MG PO TABS: ORAL_TABLET | ORAL | Status: DC

## 2012-10-06 MED ORDER — MIRTAZAPINE 15 MG PO TABS: ORAL_TABLET | ORAL | Status: DC

## 2012-10-06 MED ORDER — DULOXETINE HCL 60 MG PO CPEP: ORAL_CAPSULE | ORAL | Status: DC

## 2012-10-06 MED ORDER — MEMANTINE HCL ER 28 MG PO CP24: 28.0000 mg | ORAL_CAPSULE | Freq: Every day | ORAL | Status: DC

## 2012-10-06 MED ORDER — VITAMIN D 1000 UNITS PO TABS: ORAL_TABLET | ORAL | Status: DC

## 2012-10-06 MED ORDER — MEMANTINE HCL 10 MG PO TABS: ORAL_TABLET | ORAL | Status: DC

## 2012-10-06 MED ORDER — SUCRALFATE 1 G PO TABS: ORAL_TABLET | ORAL | Status: DC

## 2012-10-06 NOTE — Patient Instructions (Signed)
Continue current medications. 

## 2012-10-11 ENCOUNTER — Other Ambulatory Visit: Payer: Self-pay | Admitting: *Deleted

## 2012-10-11 DIAGNOSIS — E079 Disorder of thyroid, unspecified: Secondary | ICD-10-CM

## 2012-10-11 DIAGNOSIS — F0280 Dementia in other diseases classified elsewhere without behavioral disturbance: Secondary | ICD-10-CM

## 2012-10-11 DIAGNOSIS — F329 Major depressive disorder, single episode, unspecified: Secondary | ICD-10-CM

## 2012-10-11 DIAGNOSIS — I10 Essential (primary) hypertension: Secondary | ICD-10-CM

## 2012-10-11 MED ORDER — LOSARTAN POTASSIUM 100 MG PO TABS: ORAL_TABLET | ORAL | Status: DC

## 2012-10-11 MED ORDER — MIRTAZAPINE 15 MG PO TABS: ORAL_TABLET | ORAL | Status: DC

## 2012-10-11 MED ORDER — DULOXETINE HCL 60 MG PO CPEP: ORAL_CAPSULE | ORAL | Status: DC

## 2012-10-11 MED ORDER — MEMANTINE HCL ER 28 MG PO CP24: 28.0000 mg | ORAL_CAPSULE | Freq: Every day | ORAL | Status: DC

## 2012-10-11 MED ORDER — LEVOTHYROXINE SODIUM 88 MCG PO TABS: 88.0000 ug | ORAL_TABLET | Freq: Every day | ORAL | Status: DC

## 2012-10-13 ENCOUNTER — Telehealth: Payer: Self-pay | Admitting: *Deleted

## 2012-10-13 ENCOUNTER — Other Ambulatory Visit: Payer: Self-pay | Admitting: *Deleted

## 2012-10-13 DIAGNOSIS — I10 Essential (primary) hypertension: Secondary | ICD-10-CM

## 2012-10-13 DIAGNOSIS — E079 Disorder of thyroid, unspecified: Secondary | ICD-10-CM

## 2012-10-13 DIAGNOSIS — F329 Major depressive disorder, single episode, unspecified: Secondary | ICD-10-CM

## 2012-10-13 DIAGNOSIS — F0280 Dementia in other diseases classified elsewhere without behavioral disturbance: Secondary | ICD-10-CM

## 2012-10-13 MED ORDER — LEVOTHYROXINE SODIUM 88 MCG PO TABS: 88.0000 ug | ORAL_TABLET | Freq: Every day | ORAL | Status: DC

## 2012-10-13 MED ORDER — DULOXETINE HCL 60 MG PO CPEP: ORAL_CAPSULE | ORAL | Status: DC

## 2012-10-13 MED ORDER — MEMANTINE HCL 10 MG PO TABS: ORAL_TABLET | ORAL | Status: DC

## 2012-10-13 MED ORDER — LOSARTAN POTASSIUM 100 MG PO TABS: ORAL_TABLET | ORAL | Status: DC

## 2012-10-13 MED ORDER — MIRTAZAPINE 15 MG PO TABS: ORAL_TABLET | ORAL | Status: DC

## 2012-10-13 NOTE — Telephone Encounter (Signed)
Patient caregiver, Carney Bern, called and stated that she was confused about patient's medication Namenda. Wanted to know if she was suppose to be on Both Namenda 10mg  and Namenda 28XR.  I spoke with Dr. Chilton Si and he stated that patient is only to take Namenda 10mg  Two tablets once daily because the Namenda XR is on backorder with the supplier.  I tried calling Carney Bern back to notify her but had to [facility] to return call.

## 2012-10-13 NOTE — Telephone Encounter (Signed)
Patient caregiver Notified and called in Rx into pharmacy

## 2012-12-20 NOTE — Progress Notes (Signed)
Subjective:    Patient ID: [name], female    DOB: Sep 08, 1931, 79 y.o.   MRN: [id number]  Chief Complaint  Patient presents with  . Medical Managment of Chronic Issues    Dementia, hypertension, hypothyroidism, weight loss    HPI Alzheimer's disease - slowly progressive. Needs attendance 24/7.  GERD (gastroesophageal reflux disease): Controlled on current medication  Hypertension: Controlled on current medication  Thyroid disease: Controlled on current medication  Edema: 1+ bipedal  Depressive disorder, not elsewhere classified: Unchanged  Insomnia, unspecified: Nocturnal arousals and difficulty falling asleep  Unspecified vitamin D deficiency : Currently on supplements    Medication List       This list is accurate as of: 10/06/12 11:59 PM.  Always use your most recent med list.               cholecalciferol 1000 UNITS tablet  Commonly known as:  VITAMIN D  Take 1 tablet for daily supplement.     DULoxetine 60 MG capsule  Commonly known as:  CYMBALTA  One daily to help nerves     levothyroxine 88 MCG tablet  Commonly known as:  SYNTHROID, LEVOTHROID  Take 1 tablet (88 mcg total) by mouth daily before breakfast. Take 1 tablet daily for thyroid supplement.     losartan 100 MG tablet  Commonly known as:  COZAAR  One daily to control BP     Memantine HCl ER 28 MG Cp24  Commonly known as:  NAMENDA XR  Take 28 mg by mouth daily. to help preserve memory     memantine 10 MG tablet  Commonly known as:  NAMENDA  Take 2 tablets daily to help preserve memory.     mirtazapine 15 MG tablet  Commonly known as:  REMERON  One nightly to stimulate appetite and to help depression     omeprazole 20 MG capsule  Commonly known as:  PRILOSEC  One daily to reduce stomach acid     sucralfate 1 G tablet  Commonly known as:  CARAFATE  Take 1/2  In the morning and 1/2  In the evening to protect stomach.           Review of Systems  Constitutional: Positive  for fatigue. Negative for fever.       Thin. Frail. Elderly. Poor appetite  HENT: Positive for hearing loss. Negative for congestion, mouth sores, sinus pressure and sore throat.   Eyes: Negative.   Respiratory: Negative.   Cardiovascular: Negative.   Gastrointestinal: Negative.   Endocrine: Negative.   Genitourinary: Negative.   Musculoskeletal:       Right Forearm Due To a Fractured Wrist 5 Weeks Prior to This Visit. Fracture Was Pinned by Dr. Dianah Field. She Was Climbing on a [employer] and [employer] Seat when the Injury Occurred. Unstable gait.  Skin: Negative.   Allergic/Immunologic: Negative.   Neurological:       Poor balance  Hematological: Negative.   Psychiatric/Behavioral: Positive for sleep disturbance. The patient is nervous/anxious.        Brief episodes of agitation. Very poor memory.       Objective:BP 142/80  Pulse 72  Temp(Src) 97.9 F (36.6 C) (Oral)  Resp 18  Wt 131 lb 3.2 oz (59.512 kg)    Physical Exam  Constitutional:  Thin. Frail.  HENT:  Head: Normocephalic and atraumatic.  Right Ear: External ear normal.  Left Ear: External ear normal.  Nose: Nose normal.  Mouth/Throat: Oropharynx is clear and moist.  Eyes: Conjunctivae  and EOM are normal. Pupils are equal, round, and reactive to light.  Neck: No JVD present. No tracheal deviation present. No thyromegaly present.  Cardiovascular: Normal rate, regular rhythm, normal heart sounds and intact distal pulses.  Exam reveals no gallop and no friction rub.   No murmur heard. Pulmonary/Chest: No respiratory distress. She has no wheezes. She has no rales.  Abdominal: She exhibits no distension and no mass. There is no tenderness.  Musculoskeletal: Normal range of motion. She exhibits edema (Trace bipedal edema).  Lymphadenopathy:    She has no cervical adenopathy.  Neurological: No cranial nerve deficit. Coordination abnormal.  Severe memory loss. Reduce sensation to touch and vibration in both great toes. No  tremor. Mini-Mental Status exam 09/12/09 was 25/30. Mild clock drawing test.  Skin: No rash noted. No erythema. No pallor.  Psychiatric:  Poor judgment. Impulsive.     Appointment on 09/28/2012  Component Date Value Range Status  . TSH 09/28/2012 1.780  0.450 - 4.500 uIU/mL Final  . Glucose 09/28/2012 96  65 - 99 mg/dL Final  . BUN 65/78/4696 26  8 - 27 mg/dL Final  . Creatinine, Ser 09/28/2012 1.06* 0.57 - 1.00 mg/dL Final  . GFR calc non Af Amer 09/28/2012 50* >59 mL/min/1.73 Final  . GFR calc Af Amer 09/28/2012 58* >59 mL/min/1.73 Final  . BUN/Creatinine Ratio 09/28/2012 25  11 - 26 Final  . Sodium 09/28/2012 144  134 - 144 mmol/L Final  . Potassium 09/28/2012 4.5  3.5 - 5.2 mmol/L Final  . Chloride 09/28/2012 104  97 - 108 mmol/L Final  . CO2 09/28/2012 27  18 - 29 mmol/L Final  . Calcium 09/28/2012 9.7  8.6 - 10.2 mg/dL Final  . Total Protein 09/28/2012 6.7  6.0 - 8.5 g/dL Final  . Albumin 29/52/8413 4.2  3.5 - 4.8 g/dL Final  . Globulin, Total 09/28/2012 2.5  1.5 - 4.5 g/dL Final  . Albumin/Globulin Ratio 09/28/2012 1.7  1.1 - 2.5 Final  . Total Bilirubin 09/28/2012 0.4  0.0 - 1.2 mg/dL Final  . Alkaline Phosphatase 09/28/2012 102  39 - 117 IU/L Final  . AST 09/28/2012 24  0 - 40 IU/L Final  . ALT 09/28/2012 14  0 - 32 IU/L Final        Assessment & Plan:   1. GERD (gastroesophageal reflux disease) Controlled on current medications - sucralfate (CARAFATE) 1 G tablet; Take 1/2  In the morning and 1/2  In the evening to protect stomach.  Dispense: 180 tablet; Refill: 3 - omeprazole (PRILOSEC) 20 MG capsule; One daily to reduce stomach acid  Dispense: 90 capsule; Refill: 3  2. Hypertension Controlled on current medications  3. Thyroid disease Controlled on current medications  4. Edema Controlled on current medications  5. Alzheimer's disease Controlled. Continue Namenda  6. Depressive disorder, not elsewhere classified Stable  7. Insomnia,  unspecified Unchanged  8. Unspecified vitamin D deficiency Stable - cholecalciferol (VITAMIN D) 1000 UNITS tablet; Take 1 tablet for daily supplement.  Dispense: 90 tablet; Refill: 3

## 2013-02-16 ENCOUNTER — Encounter (HOSPITAL_COMMUNITY): Payer: Self-pay | Admitting: Emergency Medicine

## 2013-02-16 ENCOUNTER — Emergency Department (HOSPITAL_COMMUNITY): Payer: Medicare Other

## 2013-02-16 ENCOUNTER — Emergency Department (HOSPITAL_COMMUNITY)
Admission: EM | Admit: 2013-02-16 | Discharge: 2013-02-17 | Disposition: A | Discharge status: Home / Self Care | Payer: Medicare Other | Attending: Emergency Medicine | Admitting: Emergency Medicine

## 2013-02-16 DIAGNOSIS — I10 Essential (primary) hypertension: Secondary | ICD-10-CM | POA: Insufficient documentation

## 2013-02-16 DIAGNOSIS — Z8711 Personal history of peptic ulcer disease: Secondary | ICD-10-CM | POA: Insufficient documentation

## 2013-02-16 DIAGNOSIS — F329 Major depressive disorder, single episode, unspecified: Secondary | ICD-10-CM | POA: Insufficient documentation

## 2013-02-16 DIAGNOSIS — M545 Low back pain, unspecified: Secondary | ICD-10-CM

## 2013-02-16 DIAGNOSIS — Z87442 Personal history of urinary calculi: Secondary | ICD-10-CM | POA: Insufficient documentation

## 2013-02-16 DIAGNOSIS — W19XXXA Unspecified fall, initial encounter: Secondary | ICD-10-CM

## 2013-02-16 DIAGNOSIS — E876 Hypokalemia: Secondary | ICD-10-CM | POA: Insufficient documentation

## 2013-02-16 DIAGNOSIS — E559 Vitamin D deficiency, unspecified: Secondary | ICD-10-CM | POA: Insufficient documentation

## 2013-02-16 DIAGNOSIS — M1990 Unspecified osteoarthritis, unspecified site: Secondary | ICD-10-CM | POA: Insufficient documentation

## 2013-02-16 DIAGNOSIS — F0280 Dementia in other diseases classified elsewhere without behavioral disturbance: Secondary | ICD-10-CM | POA: Insufficient documentation

## 2013-02-16 DIAGNOSIS — M899 Disorder of bone, unspecified: Secondary | ICD-10-CM | POA: Insufficient documentation

## 2013-02-16 DIAGNOSIS — Y92009 Unspecified place in unspecified non-institutional (private) residence as the place of occurrence of the external cause: Secondary | ICD-10-CM | POA: Insufficient documentation

## 2013-02-16 DIAGNOSIS — G609 Hereditary and idiopathic neuropathy, unspecified: Secondary | ICD-10-CM | POA: Insufficient documentation

## 2013-02-16 DIAGNOSIS — G309 Alzheimer's disease, unspecified: Secondary | ICD-10-CM | POA: Insufficient documentation

## 2013-02-16 DIAGNOSIS — E039 Hypothyroidism, unspecified: Secondary | ICD-10-CM | POA: Insufficient documentation

## 2013-02-16 DIAGNOSIS — F3289 Other specified depressive episodes: Secondary | ICD-10-CM | POA: Insufficient documentation

## 2013-02-16 DIAGNOSIS — S298XXA Other specified injuries of thorax, initial encounter: Secondary | ICD-10-CM | POA: Insufficient documentation

## 2013-02-16 DIAGNOSIS — M479 Spondylosis, unspecified: Secondary | ICD-10-CM

## 2013-02-16 DIAGNOSIS — IMO0002 Reserved for concepts with insufficient information to code with codable children: Secondary | ICD-10-CM | POA: Insufficient documentation

## 2013-02-16 DIAGNOSIS — Z8781 Personal history of (healed) traumatic fracture: Secondary | ICD-10-CM | POA: Insufficient documentation

## 2013-02-16 DIAGNOSIS — F29 Unspecified psychosis not due to a substance or known physiological condition: Secondary | ICD-10-CM | POA: Insufficient documentation

## 2013-02-16 DIAGNOSIS — Z8719 Personal history of other diseases of the digestive system: Secondary | ICD-10-CM | POA: Insufficient documentation

## 2013-02-16 DIAGNOSIS — Y9389 Activity, other specified: Secondary | ICD-10-CM | POA: Insufficient documentation

## 2013-02-16 DIAGNOSIS — Z79899 Other long term (current) drug therapy: Secondary | ICD-10-CM | POA: Insufficient documentation

## 2013-02-16 DIAGNOSIS — W010XXA Fall on same level from slipping, tripping and stumbling without subsequent striking against object, initial encounter: Secondary | ICD-10-CM | POA: Insufficient documentation

## 2013-02-16 LAB — URINALYSIS, ROUTINE W REFLEX MICROSCOPIC
Bilirubin Urine: NEGATIVE
Glucose, UA: NEGATIVE mg/dL
Hgb urine dipstick: NEGATIVE
Ketones, ur: 15 mg/dL — AB
Leukocytes, UA: NEGATIVE
Nitrite: NEGATIVE
Protein, ur: NEGATIVE mg/dL
Specific Gravity, Urine: 1.014 (ref 1.005–1.030)
Urobilinogen, UA: 1 mg/dL (ref 0.0–1.0)
pH: 8 (ref 5.0–8.0)

## 2013-02-16 LAB — POCT I-STAT, CHEM 8
BUN: 17 mg/dL (ref 6–23)
Calcium, Ion: 1.07 mmol/L — ABNORMAL LOW (ref 1.13–1.30)
Chloride: 103 mEq/L (ref 96–112)
Creatinine, Ser: 0.9 mg/dL (ref 0.50–1.10)
Glucose, Bld: 92 mg/dL (ref 70–99)
HCT: 43 % (ref 36.0–46.0)
Hemoglobin: 14.6 g/dL (ref 12.0–15.0)
Potassium: 3.2 mEq/L — ABNORMAL LOW (ref 3.7–5.3)
Sodium: 143 mEq/L (ref 137–147)
TCO2: 25 mmol/L (ref 0–100)

## 2013-02-16 MED ORDER — FENTANYL CITRATE 0.05 MG/ML IJ SOLN
50.0000 ug | Freq: Once | INTRAMUSCULAR | Status: AC
  Administered 2013-02-16: 50 ug via INTRAVENOUS
  Filled 2013-02-16: qty 2

## 2013-02-16 MED ORDER — ONDANSETRON HCL 4 MG/2ML IJ SOLN
4.0000 mg | Freq: Once | INTRAMUSCULAR | Status: AC
  Administered 2013-02-16: 4 mg via INTRAVENOUS
  Filled 2013-02-16: qty 2

## 2013-02-16 MED ORDER — FENTANYL CITRATE 0.05 MG/ML IJ SOLN
50.0000 ug | Freq: Once | INTRAMUSCULAR | Status: AC
  Administered 2013-02-16: 50 ug via NASAL

## 2013-02-16 MED ORDER — FENTANYL CITRATE 0.05 MG/ML IJ SOLN
100.0000 ug | Freq: Once | INTRAMUSCULAR | Status: AC
  Administered 2013-02-16: 100 ug via INTRAVENOUS
  Filled 2013-02-16: qty 2

## 2013-02-16 MED ORDER — FENTANYL CITRATE 0.05 MG/ML IJ SOLN
INTRAMUSCULAR | Status: AC
  Filled 2013-02-16: qty 2

## 2013-02-16 NOTE — ED Notes (Signed)
Bed: WA04 Expected date:  Expected time:  Means of arrival:  Comments: Hold for Jean's mother

## 2013-02-16 NOTE — ED Provider Notes (Signed)
CSN: [id number]     Arrival date & time 02/16/13  1928 History   First MD Initiated Contact with Patient 02/16/13 1936     Chief Complaint  Patient presents with  . Fall   (Consider location/radiation/quality/duration/timing/severity/associated sxs/prior Treatment) Patient is a 79 y.o. female presenting with fall.  Fall   79 yo female presents with lower back pain following a fall that happened earlier today. Patient reports feeling a bit confused currently but is able to recall that she fell after slipping on her newly installed hardwood floor. Patient states she was wearing "slippy socks" and slipped on the hardwood floor causing her to fall flat on her back. Patient cannot confidently report whether or not she became dizzy prior to fall or whether she had a LOC after the fall. Patient denies hitting her head. Patient admits to lower midline back pain that is 8/10. Patient denies CP, Shortness of breath, Neck pain, HA, weakness. Admits to hx of intermittent diplopia and a hx of blurred vision. Denies hx of irregular heart rhythm but admits to occasional skipped beat.Denies hx of stroke.  Denies any urinary symptoms. PMH significant for HTN, Hx of fall, Dementia. Past Medical History  Diagnosis Date  . GERD (gastroesophageal reflux disease)   . Hypertension   . Personal history of fall   . Edema   . Unspecified hereditary and idiopathic peripheral neuropathy   . Pain in thoracic spine   . Abnormality of gait   . Urinary frequency   . Other protein-calorie malnutrition   . Gastric ulcer, unspecified as acute or chronic, without mention of hemorrhage, perforation, or obstruction   . Loss of weight   . Closed fracture of seventh cervical vertebra without mention of spinal cord injury   . Alzheimer's disease   . Unspecified vitamin D deficiency   . External hemorrhoids without mention of complication   . Edema   . Unspecified hypothyroidism   . Depressive disorder, not elsewhere  classified   . Unspecified constipation   . Calculus of kidney   . Osteoarthrosis, unspecified whether generalized or localized, unspecified site   . Insomnia, unspecified    Past Surgical History  Procedure Laterality Date  . Wrist surgery Right 2013   Family History  Problem Relation Age of Onset  . Cancer Mother   . CVA Father   . Cancer Brother   . Alcohol abuse Sister    History  Substance Use Topics  . Smoking status: Never Smoker   . Smokeless tobacco: Not on file  . Alcohol Use: No   OB History   Grav Para Term Preterm Abortions TAB SAB Ect Mult Living                 Review of Systems  All other systems reviewed and are negative.    Allergies  Review of patient's allergies indicates no known allergies.  Home Medications   Current Outpatient Rx  Name  Route  Sig  Dispense  Refill  . cholecalciferol (VITAMIN D) 1000 UNITS tablet   Oral   Take 1,000 Units by mouth daily.         . DULoxetine (CYMBALTA) 60 MG capsule   Oral   Take 60 mg by mouth daily.         Marland Kitchen levothyroxine (SYNTHROID, LEVOTHROID) 88 MCG tablet   Oral   Take 88 mcg by mouth daily before breakfast.         . losartan (COZAAR) 100 MG tablet  Oral   Take 100 mg by mouth daily.         . memantine (NAMENDA) 10 MG tablet   Oral   Take 20 mg by mouth daily.         . mirtazapine (REMERON) 15 MG tablet   Oral   Take 15 mg by mouth at bedtime.         Marland Kitchen HYDROcodone-acetaminophen (NORCO) 5-325 MG per tablet   Oral   Take 1 tablet by mouth every 6 (six) hours as needed for moderate pain or severe pain.   10 tablet   0    BP 184/94  Pulse 88  Temp(Src) 98.5 F (36.9 C) (Oral)  Resp 20  SpO2 95% Physical Exam  Nursing note and vitals reviewed. Constitutional: She is oriented to person, place, and time. She appears well-developed and well-nourished. No distress. Cervical collar in place.  HENT:  Head: Normocephalic and atraumatic. Head is without abrasion and  without contusion.  Eyes: Conjunctivae and EOM are normal. Pupils are equal, round, and reactive to light.  Cardiovascular: Normal rate and regular rhythm.  Exam reveals no gallop and no friction rub.   No murmur heard. Pulmonary/Chest: Effort normal and breath sounds normal. No respiratory distress. She has no wheezes. She has no rales.  Abdominal: Soft. Normal appearance and bowel sounds are normal. There is no tenderness.  Musculoskeletal: Normal range of motion. She exhibits no edema.  Midline tenderness in Lumbosacral region at the level of L5-S1.   Patient also has tenderness along RIGHT lower rib section.   Neurological: She is alert and oriented to person, place, and time.  Skin: Skin is warm and dry. She is not diaphoretic.  Psychiatric: She has a normal mood and affect. Her behavior is normal.    ED Course  Procedures (including critical care time) Labs Review Labs Reviewed  URINALYSIS, ROUTINE W REFLEX MICROSCOPIC - Abnormal; Notable for the following:    Ketones, ur 15 (*)    All other components within normal limits  POCT I-STAT, CHEM 8 - Abnormal; Notable for the following:    Potassium 3.2 (*)    Calcium, Ion 1.07 (*)    All other components within normal limits   Imaging Review Dg Chest 2 View  02/16/2013   CLINICAL DATA:  Fall with severe low back pain.  Weakness.  EXAM: CHEST  2 VIEW  COMPARISON:  10/06/11  FINDINGS: Osteopenia. Pectus deformity which is mild to moderate. Lateral view degraded by patient arm position. Patient rotated left. Borderline cardiomegaly with atherosclerosis in the transverse aorta. No pleural effusion or pneumothorax. Clear lungs. Vague increased density projecting over the upper lobes is felt to be positional.  IMPRESSION: No acute cardiopulmonary disease.   Electronically Signed   By: Jeronimo Greaves M.D.   On: 02/16/2013 21:53   Dg Ribs Unilateral Left  02/16/2013   CLINICAL DATA:  Left-sided rib pain after a fall.  EXAM: LEFT RIBS - 2  VIEW  COMPARISON:  Chest 02/16/2013  FINDINGS: No fracture or other bone lesions are seen involving the ribs.  IMPRESSION: Negative.   Electronically Signed   By: Burman Nieves M.D.   On: 02/16/2013 23:25   Dg Ribs Unilateral Right  02/16/2013   CLINICAL DATA:  Fall with severe low back pain.  Weakness.  EXAM: RIGHT RIBS - 2 VIEW  COMPARISON:  Chest film of 10/06/2011  FINDINGS: Four views of right-sided ribs. Osteopenia. Minimally displaced fractures of lower anterior lateral right ribs.  These may be subacute. Approximately 7th through 9th. Most apparent on the 1st image.  IMPRESSION: Minimally displaced lower anterior right rib fractures. These could be subacute, given suggestion of sclerosis. Correlate with point tenderness. No pleural fluid or pneumothorax identified.   Electronically Signed   By: Jeronimo Greaves M.D.   On: 02/16/2013 21:49   Dg Thoracic Spine 2 View  02/16/2013   CLINICAL DATA:  Fall with severe low back pain and weakness.  EXAM: THORACIC SPINE - 2 VIEW  COMPARISON:  10/06/11  FINDINGS: Convex right spinal curvature on the frontal radiograph. The lateral view images from approximately T4 through T12. Spondylosis, without vertebral body height loss across these levels. The swimmer's view is nondiagnostic, as the upper cervical spine is not imaged. This makes localization impossible. No vertebral body height loss identified within the imaged upper thoracic spine.  IMPRESSION: Suboptimal evaluation of the upper thoracic spine.  Otherwise, spondylosis without acute osseous finding.   Electronically Signed   By: Jeronimo Greaves M.D.   On: 02/16/2013 21:56   Dg Lumbar Spine Complete  02/16/2013   CLINICAL DATA:  Trauma and pain.  EXAM: LUMBAR SPINE - COMPLETE 4+ VIEW  COMPARISON:  08/30/2010 CT  FINDINGS: Osteopenia. Five lumbar type vertebral bodies. Sacroiliac joints are symmetric. Maintenance of vertebral body height and alignment. Loss of intervertebral disc height at the lumbosacral  junction. Aortic atherosclerosis.  IMPRESSION: Osteopenia, without acute osseous finding.   Electronically Signed   By: Jeronimo Greaves M.D.   On: 02/16/2013 21:50   Ct Head Wo Contrast  02/16/2013   CLINICAL DATA:  Fall.  EXAM: CT HEAD WITHOUT CONTRAST  CT CERVICAL SPINE WITHOUT CONTRAST  TECHNIQUE: Multidetector CT imaging of the head and cervical spine was performed following the standard protocol without intravenous contrast. Multiplanar CT image reconstructions of the cervical spine were also generated.  COMPARISON:  09/07/2012  FINDINGS: CT HEAD FINDINGS  Sinuses/Soft tissues: Clear paranasal sinuses and mastoid air cells.  Intracranial: Moderate to marked low density in the periventricular white matter likely related to small vessel disease. Left cerebellar hemisphere partially calcified meningioma again identified at 1.0 cm. Otherwise, no mass lesion, hemorrhage, hydrocephalus, acute infarct, intra-axial, or extra-axial fluid collection.  CT CERVICAL SPINE FINDINGS  Spinal visualization through the bottom of T2. Prevertebral soft tissues are within normal limits.  No apical pneumothorax.  Skull base intact. Mild superior endplate irregularity at T1 is chronic and favored to be degenerative. Maintenance of vertebral body height and alignment. Multilevel spondylosis, most advanced at C5-6. Facets are well-aligned. Coronal reformats demonstrate a normal C1-C2 articulation. Minimal convex left curvature.  IMPRESSION: CT HEAD IMPRESSION  1.  No acute intracranial abnormality. 2. Moderate small vessel ischemic change.  CT CERVICAL SPINE IMPRESSION  Spondylosis, without acute finding in the cervical spine.   Electronically Signed   By: Jeronimo Greaves M.D.   On: 02/16/2013 21:32   Ct Cervical Spine Wo Contrast  02/16/2013   CLINICAL DATA:  Fall.  EXAM: CT HEAD WITHOUT CONTRAST  CT CERVICAL SPINE WITHOUT CONTRAST  TECHNIQUE: Multidetector CT imaging of the head and cervical spine was performed following the  standard protocol without intravenous contrast. Multiplanar CT image reconstructions of the cervical spine were also generated.  COMPARISON:  09/07/2012  FINDINGS: CT HEAD FINDINGS  Sinuses/Soft tissues: Clear paranasal sinuses and mastoid air cells.  Intracranial: Moderate to marked low density in the periventricular white matter likely related to small vessel disease. Left cerebellar hemisphere partially calcified meningioma again identified  at 1.0 cm. Otherwise, no mass lesion, hemorrhage, hydrocephalus, acute infarct, intra-axial, or extra-axial fluid collection.  CT CERVICAL SPINE FINDINGS  Spinal visualization through the bottom of T2. Prevertebral soft tissues are within normal limits.  No apical pneumothorax.  Skull base intact. Mild superior endplate irregularity at T1 is chronic and favored to be degenerative. Maintenance of vertebral body height and alignment. Multilevel spondylosis, most advanced at C5-6. Facets are well-aligned. Coronal reformats demonstrate a normal C1-C2 articulation. Minimal convex left curvature.  IMPRESSION: CT HEAD IMPRESSION  1.  No acute intracranial abnormality. 2. Moderate small vessel ischemic change.  CT CERVICAL SPINE IMPRESSION  Spondylosis, without acute finding in the cervical spine.   Electronically Signed   By: Jeronimo Greaves M.D.   On: 02/16/2013 21:32    EKG Interpretation    Date/Time:  Wednesday February 16 2013 20:56:25 EST Ventricular Rate:  81 PR Interval:  168 QRS Duration: 99 QT Interval:  466 QTC Calculation: 541 R Axis:   74 Text Interpretation:  Normal sinus rhythm Since last tracing QT has lengthened Confirmed by Effie Shy  MD, ELLIOTT ([id number]) on 02/16/2013 9:41:47 PM            MDM   1. Fall at home, initial encounter   2. Low back pain   3. Spondylosis    UA negative. Mild hypokalemia. EKG shows prolonged QT interval. Plain films show minimally displace lower anterior RIGHT rib fractures. Lumbar spine shows osteopenia w/o acute  injury. Thoracic spin shows spondylosis w/o acute bony abnormalities. CT Head shows no acute intracranial abnormality with moderate ischemic changes, appear to be chronic. Meningioma identified in left posterior cranial fossa, unchanged since last study. CT cervical spine shows spondylosis without acute injury.   BP elevated at today's visit. Patient has hx of HTN. Cannot remember if she has taken BP medication today. Suspect elevated BP related to acute pain.   Discussed imaging and exam findings with patient. Advised follow up with PCP in 1 week. Recommend return to ED should symptoms worsen. Patient agrees with plan. Discharged in good condition. Patient discussed with Dr. Gray Bernhardt.   Meds given in ED:  Medications  fentaNYL (SUBLIMAZE) injection 50 mcg (50 mcg Nasal Given 02/16/13 1939)  fentaNYL (SUBLIMAZE) 0.05 MG/ML injection (  Duplicate 02/16/13 1939)  fentaNYL (SUBLIMAZE) injection 50 mcg (50 mcg Intravenous Given 02/16/13 2142)  fentaNYL (SUBLIMAZE) injection 100 mcg (100 mcg Intravenous Given 02/16/13 2307)  ondansetron (ZOFRAN) injection 4 mg (4 mg Intravenous Given 02/16/13 2329)    Discharge Medication List as of 02/17/2013 12:26 AM         Rudene Anda, PA-C 02/17/13 [phone]

## 2013-02-16 NOTE — ED Provider Notes (Signed)
  Face-to-face evaluation   History: Patient fell at home, alone. She is unable to ambulated afterwards. She developed low back pain bilateral pain and numbness in her feet after the fall. She has had only minimal relief with fentanyl, given earlier.   Physical exam: Alert, elderly, female , who is in moderate pain. She hasmoderate bilateral anterolateral chest wall tenderness, without crepitation. Abdomen is nontender, in the epigastric area, but has mild tenderness in suprapubic area with distention consistent with urinary bladder enlargement. She has normal strength in the legs, bilaterally.  Medical screening examination/treatment/procedure(s) were conducted as a shared visit with non-physician practitioner(s) and myself.  I personally evaluated the patient during the encounter  Flint Melter, MD 02/17/13 [phone]

## 2013-02-16 NOTE — ED Notes (Signed)
Pt fell with possible LOC, called her daughter reported being in floor since 1800, c/o low back pain, c-collar applied but denied neck pain, denied blood thinner.

## 2013-02-17 MED ORDER — HYDROCODONE-ACETAMINOPHEN 5-325 MG PO TABS: 1.0000 | ORAL_TABLET | Freq: Four times a day (QID) | ORAL | Status: DC | PRN

## 2013-02-17 NOTE — Discharge Instructions (Signed)
Follow up with your primary care provider in 1 week. Take medication as prescribed. If symptoms should worsen, you develop progressive leg weakness, incontinence, or numbness in the lower extremities bilaterally return to the emergency department.

## 2013-02-24 ENCOUNTER — Encounter (HOSPITAL_COMMUNITY): Payer: Self-pay | Admitting: Emergency Medicine

## 2013-02-24 ENCOUNTER — Emergency Department (HOSPITAL_COMMUNITY)
Admission: EM | Admit: 2013-02-24 | Discharge: 2013-02-24 | Disposition: A | Discharge status: Home / Self Care | Payer: Medicare PPO | Attending: Emergency Medicine | Admitting: Emergency Medicine

## 2013-02-24 DIAGNOSIS — K5900 Constipation, unspecified: Secondary | ICD-10-CM | POA: Insufficient documentation

## 2013-02-24 DIAGNOSIS — F329 Major depressive disorder, single episode, unspecified: Secondary | ICD-10-CM | POA: Insufficient documentation

## 2013-02-24 DIAGNOSIS — M1990 Unspecified osteoarthritis, unspecified site: Secondary | ICD-10-CM | POA: Insufficient documentation

## 2013-02-24 DIAGNOSIS — Z87442 Personal history of urinary calculi: Secondary | ICD-10-CM | POA: Insufficient documentation

## 2013-02-24 DIAGNOSIS — I10 Essential (primary) hypertension: Secondary | ICD-10-CM | POA: Insufficient documentation

## 2013-02-24 DIAGNOSIS — Z79899 Other long term (current) drug therapy: Secondary | ICD-10-CM | POA: Insufficient documentation

## 2013-02-24 DIAGNOSIS — Z8669 Personal history of other diseases of the nervous system and sense organs: Secondary | ICD-10-CM | POA: Insufficient documentation

## 2013-02-24 DIAGNOSIS — G309 Alzheimer's disease, unspecified: Secondary | ICD-10-CM | POA: Insufficient documentation

## 2013-02-24 DIAGNOSIS — Z862 Personal history of diseases of the blood and blood-forming organs and certain disorders involving the immune mechanism: Secondary | ICD-10-CM | POA: Insufficient documentation

## 2013-02-24 DIAGNOSIS — Z9181 History of falling: Secondary | ICD-10-CM | POA: Insufficient documentation

## 2013-02-24 DIAGNOSIS — K649 Unspecified hemorrhoids: Secondary | ICD-10-CM

## 2013-02-24 DIAGNOSIS — F3289 Other specified depressive episodes: Secondary | ICD-10-CM | POA: Insufficient documentation

## 2013-02-24 DIAGNOSIS — Z8639 Personal history of other endocrine, nutritional and metabolic disease: Secondary | ICD-10-CM | POA: Insufficient documentation

## 2013-02-24 DIAGNOSIS — E039 Hypothyroidism, unspecified: Secondary | ICD-10-CM | POA: Insufficient documentation

## 2013-02-24 DIAGNOSIS — F0280 Dementia in other diseases classified elsewhere without behavioral disturbance: Secondary | ICD-10-CM | POA: Insufficient documentation

## 2013-02-24 DIAGNOSIS — Z8781 Personal history of (healed) traumatic fracture: Secondary | ICD-10-CM | POA: Insufficient documentation

## 2013-02-24 DIAGNOSIS — K644 Residual hemorrhoidal skin tags: Secondary | ICD-10-CM | POA: Insufficient documentation

## 2013-02-24 MED ORDER — HYDROCORTISONE 2.5 % RE CREA: TOPICAL_CREAM | RECTAL | Status: DC

## 2013-02-24 NOTE — ED Provider Notes (Signed)
CSN: [id number]     Arrival date & time 02/24/13  0038 History   First MD Initiated Contact with Patient 02/24/13 0106     Chief Complaint  Patient presents with  . Hemorrhoids  . Hypertension   (Consider location/radiation/quality/duration/timing/severity/associated sxs/prior Treatment) HPI Comments: Patient is a 80 year old female history of hypertension. She presents today with complaints of hemorrhoids. She has had issues with this for many years. When she gets constipated she states that she has to strain to have a bowel movement. She had to do that this evening and noticed that her hemorrhoids "popped out". She denies bleeding, fever, abdominal pain, vomiting.  Patient is a 80 y.o. female presenting with hypertension. The history is provided by the patient.  Hypertension This is a recurrent problem. The current episode started 3 to 5 hours ago. The problem occurs constantly. The problem has been gradually improving. Pertinent negatives include no abdominal pain. Nothing aggravates the symptoms. Nothing relieves the symptoms. She has tried nothing for the symptoms. The treatment provided no relief.    Past Medical History  Diagnosis Date  . GERD (gastroesophageal reflux disease)   . Hypertension   . Personal history of fall   . Edema   . Unspecified hereditary and idiopathic peripheral neuropathy   . Pain in thoracic spine   . Abnormality of gait   . Urinary frequency   . Other protein-calorie malnutrition   . Gastric ulcer, unspecified as acute or chronic, without mention of hemorrhage, perforation, or obstruction   . Loss of weight   . Closed fracture of seventh cervical vertebra without mention of spinal cord injury   . Alzheimer's disease   . Unspecified vitamin D deficiency   . External hemorrhoids without mention of complication   . Edema   . Unspecified hypothyroidism   . Depressive disorder, not elsewhere classified   . Unspecified constipation   . Calculus of kidney    . Osteoarthrosis, unspecified whether generalized or localized, unspecified site   . Insomnia, unspecified    Past Surgical History  Procedure Laterality Date  . Wrist surgery Right 2013   Family History  Problem Relation Age of Onset  . Cancer Mother   . CVA Father   . Cancer Brother   . Alcohol abuse Sister    History  Substance Use Topics  . Smoking status: Never Smoker   . Smokeless tobacco: Not on file  . Alcohol Use: No   OB History   Grav Para Term Preterm Abortions TAB SAB Ect Mult Living                 Review of Systems  Gastrointestinal: Negative for abdominal pain.  All other systems reviewed and are negative.    Allergies  Review of patient's allergies indicates no known allergies.  Home Medications   Current Outpatient Rx  Name  Route  Sig  Dispense  Refill  . cholecalciferol (VITAMIN D) 1000 UNITS tablet   Oral   Take 1,000 Units by mouth daily.         . DULoxetine (CYMBALTA) 60 MG capsule   Oral   Take 60 mg by mouth daily.         Marland Kitchen HYDROcodone-acetaminophen (NORCO) 5-325 MG per tablet   Oral   Take 1 tablet by mouth every 6 (six) hours as needed for moderate pain or severe pain.   10 tablet   0   . levothyroxine (SYNTHROID, LEVOTHROID) 88 MCG tablet   Oral  Take 88 mcg by mouth daily before breakfast.         . losartan (COZAAR) 100 MG tablet   Oral   Take 100 mg by mouth daily.         . memantine (NAMENDA) 10 MG tablet   Oral   Take 20 mg by mouth daily.         . mirtazapine (REMERON) 15 MG tablet   Oral   Take 15 mg by mouth at bedtime.          BP 223/100  Pulse 76  Temp(Src) 97.4 F (36.3 C) (Oral)  Resp 16  SpO2 98% Physical Exam  Nursing note and vitals reviewed. Constitutional: She is oriented to person, place, and time. She appears well-developed and well-nourished. No distress.  HENT:  Head: Normocephalic and atraumatic.  Neck: Normal range of motion. Neck supple.  Cardiovascular: Normal rate  and regular rhythm.  Exam reveals no gallop and no friction rub.   No murmur heard. Pulmonary/Chest: Effort normal and breath sounds normal. No respiratory distress. She has no wheezes.  Abdominal: Soft. Bowel sounds are normal. She exhibits no distension. There is no tenderness.  Genitourinary:  There are several small hemorrhoids noted circumferentially around the anus. I see some inflammation but no thrombosis. Rectal examination is otherwise unremarkable. There are no masses and no gross blood.  Musculoskeletal: Normal range of motion.  Neurological: She is alert and oriented to person, place, and time.  Skin: Skin is warm and dry. She is not diaphoretic.    ED Course  Procedures (including critical care time) Labs Review Labs Reviewed - No data to display Imaging Review No results found.    MDM  No diagnosis found. Patient is a 80 year old female who presents with complaints of aggravating her hemorrhoids while straining to go to the bathroom. They're not thrombosed and not actively bleeding however are tender. Her blood pressure was elevated however I feel as though this is likely related to stress and pain. She appears stable for discharge. I will prescribe hydrocortisone cream and recommended she alternate this with Preparation H. She is also to take stool softeners and increase the fiber in her diet with Metamucil supplementation. She is to followup with surgery if not improving in the next one to 2 weeks.    Veryl Speak, MD 02/24/13 [phone]

## 2013-02-24 NOTE — ED Notes (Signed)
Spoke with daughter, pt will need to be transported [place]. Next door neighbor will have keys to get in. Neighbor Mickel Baas) number is [phone]

## 2013-02-24 NOTE — ED Notes (Signed)
MD at bedside. 

## 2013-02-24 NOTE — ED Notes (Signed)
Pt c/o hemorrhoid pain; states "came out" today; ambulatory

## 2013-02-24 NOTE — Discharge Instructions (Signed)
Alternate hydrocortisone cream every 4 hours with preparation H.  Colace (equate stool softener) 100 mg twice daily.  Metamucil: 1 heaping teaspoon in a glass of water 3 times daily.  Followup with [facility] surgery if not improving in the next week. The contact information has been provided on this discharge summary.   Hemorrhoids Hemorrhoids are swollen veins around the rectum or anus. There are two types of hemorrhoids:   Internal hemorrhoids. These occur in the veins just inside the rectum. They may poke through to the outside and become irritated and painful.  External hemorrhoids. These occur in the veins outside the anus and can be felt as a painful swelling or hard lump near the anus. CAUSES  Pregnancy.   Obesity.   Constipation or diarrhea.   Straining to have a bowel movement.   Sitting for long periods on the toilet.  Heavy lifting or other activity that caused you to strain.  Anal intercourse. SYMPTOMS   Pain.   Anal itching or irritation.   Rectal bleeding.   Fecal leakage.   Anal swelling.   One or more lumps around the anus.  DIAGNOSIS  Your caregiver may be able to diagnose hemorrhoids by visual examination. Other examinations or tests that may be performed include:   Examination of the rectal area with a gloved hand (digital rectal exam).   Examination of anal canal using a small tube (scope).   A blood test if you have lost a significant amount of blood.  A test to look inside the colon (sigmoidoscopy or colonoscopy). TREATMENT Most hemorrhoids can be treated at home. However, if symptoms do not seem to be getting better or if you have a lot of rectal bleeding, your caregiver may perform a procedure to help make the hemorrhoids get smaller or remove them completely. Possible treatments include:   Placing a rubber band at the base of the hemorrhoid to cut off the circulation (rubber band ligation).   Injecting a chemical  to shrink the hemorrhoid (sclerotherapy).   Using a tool to burn the hemorrhoid (infrared light therapy).   Surgically removing the hemorrhoid (hemorrhoidectomy).   Stapling the hemorrhoid to block blood flow to the tissue (hemorrhoid stapling).  HOME CARE INSTRUCTIONS   Eat foods with fiber, such as whole grains, beans, nuts, fruits, and vegetables. Ask your doctor about taking products with added fiber in them (fibersupplements).  Increase fluid intake. Drink enough water and fluids to keep your urine clear or pale yellow.   Exercise regularly.   Go to the bathroom when you have the urge to have a bowel movement. Do not wait.   Avoid straining to have bowel movements.   Keep the anal area dry and clean. Use wet toilet paper or moist towelettes after a bowel movement.   Medicated creams and suppositories may be used or applied as directed.   Only take over-the-counter or prescription medicines as directed by your caregiver.   Take warm sitz baths for 15 20 minutes, 3 4 times a day to ease pain and discomfort.   Place ice packs on the hemorrhoids if they are tender and swollen. Using ice packs between sitz baths may be helpful.   Put ice in a plastic bag.   Place a towel between your skin and the bag.   Leave the ice on for 15 20 minutes, 3 4 times a day.   Do not use a donut-shaped pillow or sit on the toilet for long periods. This increases blood  pooling and pain.  SEEK MEDICAL CARE IF:  You have increasing pain and swelling that is not controlled by treatment or medicine.  You have uncontrolled bleeding.  You have difficulty or you are unable to have a bowel movement.  You have pain or inflammation outside the area of the hemorrhoids. MAKE SURE YOU:  Understand these instructions.  Will watch your condition.  Will get help right away if you are not doing well or get worse. Document Released: 02/01/2000 Document Revised: 01/21/2012 Document  Reviewed: 12/09/2011 [facility] Patient Information 2014 [place].

## 2013-02-24 NOTE — ED Notes (Signed)
[facility] called  

## 2013-03-03 ENCOUNTER — Ambulatory Visit: Payer: Self-pay | Admitting: Nurse Practitioner

## 2013-03-03 ENCOUNTER — Other Ambulatory Visit: Payer: Self-pay | Admitting: *Deleted

## 2013-03-03 MED ORDER — MIRTAZAPINE 15 MG PO TABS: 15.0000 mg | ORAL_TABLET | Freq: Every day | ORAL | Status: DC

## 2013-03-03 MED ORDER — MEMANTINE HCL 10 MG PO TABS: 20.0000 mg | ORAL_TABLET | Freq: Every day | ORAL | Status: DC

## 2013-03-03 MED ORDER — LOSARTAN POTASSIUM 100 MG PO TABS: 100.0000 mg | ORAL_TABLET | Freq: Every day | ORAL | Status: DC

## 2013-03-03 MED ORDER — LEVOTHYROXINE SODIUM 88 MCG PO TABS: 88.0000 ug | ORAL_TABLET | Freq: Every day | ORAL | Status: DC

## 2013-03-03 MED ORDER — DULOXETINE HCL 60 MG PO CPEP: 60.0000 mg | ORAL_CAPSULE | Freq: Every day | ORAL | Status: DC

## 2013-03-10 ENCOUNTER — Telehealth: Payer: Self-pay | Admitting: Nurse Practitioner

## 2013-03-10 ENCOUNTER — Encounter: Payer: Self-pay | Admitting: Nurse Practitioner

## 2013-03-10 ENCOUNTER — Ambulatory Visit (INDEPENDENT_AMBULATORY_CARE_PROVIDER_SITE_OTHER): Payer: Medicare HMO | Admitting: Nurse Practitioner

## 2013-03-10 VITALS — BP 158/96 | HR 59 | Temp 97.6°F | Resp 10 | Wt 121.0 lb

## 2013-03-10 DIAGNOSIS — I10 Essential (primary) hypertension: Secondary | ICD-10-CM

## 2013-03-10 DIAGNOSIS — F3289 Other specified depressive episodes: Secondary | ICD-10-CM

## 2013-03-10 DIAGNOSIS — G309 Alzheimer's disease, unspecified: Secondary | ICD-10-CM

## 2013-03-10 DIAGNOSIS — M549 Dorsalgia, unspecified: Secondary | ICD-10-CM

## 2013-03-10 DIAGNOSIS — R634 Abnormal weight loss: Secondary | ICD-10-CM

## 2013-03-10 DIAGNOSIS — E039 Hypothyroidism, unspecified: Secondary | ICD-10-CM

## 2013-03-10 DIAGNOSIS — F0280 Dementia in other diseases classified elsewhere without behavioral disturbance: Secondary | ICD-10-CM

## 2013-03-10 DIAGNOSIS — F329 Major depressive disorder, single episode, unspecified: Secondary | ICD-10-CM

## 2013-03-10 DIAGNOSIS — E559 Vitamin D deficiency, unspecified: Secondary | ICD-10-CM

## 2013-03-10 MED ORDER — TRAMADOL HCL 50 MG PO TABS
ORAL_TABLET | ORAL | Status: DC
Start: 2013-03-10 — End: 2013-03-10

## 2013-03-10 MED ORDER — MEMANTINE HCL ER 28 MG PO CP24: 28.0000 mg | ORAL_CAPSULE | Freq: Every day | ORAL | Status: DC

## 2013-03-10 MED ORDER — MIRTAZAPINE 15 MG PO TABS: ORAL_TABLET | ORAL | Status: DC

## 2013-03-10 MED ORDER — TRAMADOL HCL 50 MG PO TABS: ORAL_TABLET | ORAL | Status: DC

## 2013-03-10 NOTE — Progress Notes (Signed)
Patient ID: [name], female   DOB: [birth year], 80 y.o.   MRN: [id number]    No Known Allergies  Chief Complaint  Patient presents with  . Back Pain    Patient fell 02/16/2014, seen in ER. Patient  c/o ongoing pain throbbing lower back pain   . Anorexia    Patient with 10 pound weight loss since August 2014. Patient with no desire to eat   . Hypertension    Patient with elevated B/P     HPI: Patient is a 80 y.o. female seen in the office today for acute visit Was on aricept but had this stopped in the hospital at one point but daughter does not know why; memory is gradually progressing Taking mirtazapine 15 mg at night for appetite; but has not been eating, has meals provided but just wont eat; no mouth pain, no abdominal pain, no constipation or diarrhea, no  Sleeps all the time [place] on 12/31; and went to the ED; has been in a lot of pain since, will not move, sits on the heating pad  Daughter has been giving her tylenol 650 mg 3 tablets twice a day xrays were done which were neg; dx soft tissue damage but pain is no better and it has been 4 weeks; has not been moving  Review of Systems:  Review of Systems  Constitutional: Positive for weight loss. Negative for fever and chills.  Respiratory: Negative for cough and shortness of breath.   Cardiovascular: Negative for chest pain, palpitations and leg swelling.  Gastrointestinal: Negative for heartburn, nausea, vomiting, abdominal pain, diarrhea and constipation.  Genitourinary: Negative for dysuria, urgency and frequency.  Musculoskeletal: Positive for back pain and myalgias.  Skin: Negative.   Neurological: Positive for weakness. Negative for dizziness and headaches.  Psychiatric/Behavioral: Positive for depression and memory loss.     Past Medical History  Diagnosis Date  . GERD (gastroesophageal reflux disease)   . Hypertension   . Personal history of fall   . Edema   . Unspecified hereditary and idiopathic peripheral  neuropathy   . Pain in thoracic spine   . Abnormality of gait   . Urinary frequency   . Other protein-calorie malnutrition   . Gastric ulcer, unspecified as acute or chronic, without mention of hemorrhage, perforation, or obstruction   . Loss of weight   . Closed fracture of seventh cervical vertebra without mention of spinal cord injury   . Alzheimer's disease   . Unspecified vitamin D deficiency   . External hemorrhoids without mention of complication   . Edema   . Unspecified hypothyroidism   . Depressive disorder, not elsewhere classified   . Unspecified constipation   . Calculus of kidney   . Osteoarthrosis, unspecified whether generalized or localized, unspecified site   . Insomnia, unspecified    Past Surgical History  Procedure Laterality Date  . Wrist surgery Right 2013   Social History:   reports that she has never smoked. She does not have any smokeless tobacco history on file. She reports that she does not drink alcohol or use illicit drugs.  Family History  Problem Relation Age of Onset  . Cancer Mother   . CVA Father   . Cancer Brother   . Alcohol abuse Sister     Medications: Patient's Medications  New Prescriptions   No medications on file  Previous Medications   DULOXETINE (CYMBALTA) 60 MG CAPSULE    Take 1 capsule (60 mg total) by mouth daily.  HYDROCODONE-ACETAMINOPHEN (NORCO) 5-325 MG PER TABLET    Take 1 tablet by mouth every 6 (six) hours as needed for moderate pain or severe pain.   LEVOTHYROXINE (SYNTHROID, LEVOTHROID) 88 MCG TABLET    Take 1 tablet (88 mcg total) by mouth daily before breakfast.   LOSARTAN (COZAAR) 100 MG TABLET    Take 1 tablet (100 mg total) by mouth daily.   MEMANTINE (NAMENDA) 10 MG TABLET    Take 2 tablets (20 mg total) by mouth daily.   MIRTAZAPINE (REMERON) 15 MG TABLET    Take 1 tablet (15 mg total) by mouth at bedtime.  Modified Medications   No medications on file  Discontinued Medications   CHOLECALCIFEROL (VITAMIN  D) 1000 UNITS TABLET    Take 1,000 Units by mouth daily.   HYDROCORTISONE (ANUSOL-HC) 2.5 % RECTAL CREAM    Apply rectally 2 times daily     Physical Exam:  Filed Vitals:   03/10/13 1608  BP: 158/96  Pulse: 59  Temp: 97.6 F (36.4 C)  TempSrc: Oral  Resp: 10  Weight: 121 lb (54.885 kg)  SpO2: 95%    Physical Exam  Constitutional:  Frail female in NAD  Neck: Normal range of motion. Neck supple. No JVD present. No thyromegaly present.  Cardiovascular: Normal rate, regular rhythm and normal heart sounds.   Pulmonary/Chest: Effort normal and breath sounds normal. No respiratory distress. She has no wheezes.  Abdominal: Soft. Bowel sounds are normal. She exhibits no distension. There is no tenderness. There is no rebound and no guarding.  Musculoskeletal: She exhibits tenderness (paraspinal muscle to the lumbar spine and cocyxx and surrounding area; skin intact without bruising or edema). She exhibits no edema.  Lymphadenopathy:    She has no cervical adenopathy.  Neurological: She is alert.  Unsteady; slow gait  Skin: Skin is warm and dry. No erythema.  Psychiatric: She exhibits a depressed mood (tearful).     Labs reviewed: Basic Metabolic Panel:  Recent Labs  09/28/12 0849 02/16/13 2159  NA 144 143  K 4.5 3.2*  CL 104 103  CO2 27  --   GLUCOSE 96 92  BUN 26 17  CREATININE 1.06* 0.90  CALCIUM 9.7  --   TSH 1.780  --    Liver Function Tests:  Recent Labs  09/28/12 0849  AST 24  ALT 14  ALKPHOS 102  BILITOT 0.4  PROT 6.7   No results found for this basename: LIPASE, AMYLASE,  in the last 8760 hours No results found for this basename: AMMONIA,  in the last 8760 hours CBC:  Recent Labs  02/16/13 2159  HGB 14.6  HCT 43.0   Lipid Panel: No results found for this basename: CHOL, HDL, LDLCALC, TRIG, CHOLHDL, LDLDIRECT,  in the last 8760 hours TSH:  Recent Labs  09/28/12 0849  TSH 1.780   A1C: No components found with this basename: A1C,     Assessment/Plan 1. Hypertension -blood pressure is elevated today; however pt very unsteady at home -will have pts daughter take blood pressure and record and bring readings back to visit -to call if blood pressure is staying over 160/100 - Comprehensive metabolic panel  2. HYPOTHYROIDISM -taking synthroid; will follow up TSH - TSH  3. Alzheimer's disease -worsening; pt lives alone at home with neighbors and family frequently checking in on her -will get SW involved for more resources for family and pt - Memantine HCl ER 28 MG CP24; Take 28 mg by mouth daily.  Dispense: 90  capsule; Refill: 3  4. Depressive disorder, not elsewhere classified -worse; will cont cymbalta; encouraged socialization and activities  5. Loss of weight -to liberalize diet; be creative; make eating a social event - CBC With differential/Platelet - Comprehensive metabolic panel - TSH - mirtazapine (REMERON) 15 MG tablet; 1/2- 1 tablet every night at bedtime  Dispense: 90 tablet; Refill: 3  6. Back pain -s/p fall and pain has been worse since then - Ambulatory referral to [facility] for PT/OT -will have [facility] come out to help with AdlS -educated on proper dosing of tylenol may also use tramadol if needed but to start at lower dose - traMADol (ULTRAM) 50 MG tablet; 1-2 tablets every 6-8 hours as needed for pain/max of 6 tablets in 24 hours  Dispense: 180 tablet; Refill: 3   To follow up in 6 weeks or sooner if needed

## 2013-03-10 NOTE — Patient Instructions (Signed)
Will get home health involved   FOR PAIN Tylenol-- max dose in 24 hours is 3000 mg -- max dose 1000 mg in 1 dose *new* Tramadol 50 mg 1-2 tablets every 6-8 hours  FOR APPETITE Liberalize diet *new* Decrease Remeron to 1/2 tablet at night  MEMORY *new* changing namanda to XR   Follow up in 6 weeks

## 2013-03-10 NOTE — Telephone Encounter (Signed)
Will you please see if a vit D level can be added to pts labs that were done on 03/10/13 thanks

## 2013-03-11 LAB — CBC WITH DIFFERENTIAL
Basophils Absolute: 0 10*3/uL (ref 0.0–0.2)
Basos: 1 %
Eos: 2 %
Eosinophils Absolute: 0.1 10*3/uL (ref 0.0–0.4)
HCT: 38.5 % (ref 34.0–46.6)
Hemoglobin: 12.9 g/dL (ref 11.1–15.9)
Immature Grans (Abs): 0 10*3/uL (ref 0.0–0.1)
Immature Granulocytes: 0 %
Lymphocytes Absolute: 1.8 10*3/uL (ref 0.7–3.1)
Lymphs: 29 %
MCH: 33.3 pg — ABNORMAL HIGH (ref 26.6–33.0)
MCHC: 33.5 g/dL (ref 31.5–35.7)
MCV: 100 fL — ABNORMAL HIGH (ref 79–97)
Monocytes Absolute: 0.4 10*3/uL (ref 0.1–0.9)
Monocytes: 6 %
Neutrophils Absolute: 3.8 10*3/uL (ref 1.4–7.0)
Neutrophils Relative %: 62 %
Platelets: 219 10*3/uL (ref 150–379)
RBC: 3.87 x10E6/uL (ref 3.77–5.28)
RDW: 13.4 % (ref 12.3–15.4)
WBC: 6.2 10*3/uL (ref 3.4–10.8)

## 2013-03-11 LAB — COMPREHENSIVE METABOLIC PANEL
ALT: 5 IU/L (ref 0–32)
AST: 17 IU/L (ref 0–40)
Albumin/Globulin Ratio: 1.8 (ref 1.1–2.5)
Albumin: 4.5 g/dL (ref 3.5–4.8)
Alkaline Phosphatase: 125 IU/L — ABNORMAL HIGH (ref 39–117)
BUN/Creatinine Ratio: 29 — ABNORMAL HIGH (ref 11–26)
BUN: 23 mg/dL (ref 8–27)
CO2: 27 mmol/L (ref 18–29)
Calcium: 9.4 mg/dL (ref 8.7–10.3)
Chloride: 101 mmol/L (ref 97–108)
Creatinine, Ser: 0.8 mg/dL (ref 0.57–1.00)
GFR calc Af Amer: 81 mL/min/{1.73_m2} (ref >59)
GFR calc non Af Amer: 70 mL/min/{1.73_m2} (ref >59)
Globulin, Total: 2.5 g/dL (ref 1.5–4.5)
Glucose: 91 mg/dL (ref 65–99)
Potassium: 3.6 mmol/L (ref 3.5–5.2)
Sodium: 143 mmol/L (ref 134–144)
Total Bilirubin: 0.3 mg/dL (ref 0.0–1.2)
Total Protein: 7 g/dL (ref 6.0–8.5)

## 2013-03-11 LAB — TSH: TSH: 3.43 u[IU]/mL (ref 0.450–4.500)

## 2013-03-11 NOTE — Telephone Encounter (Signed)
Verbal to add lab vit D level given to  Mel.

## 2013-03-14 ENCOUNTER — Other Ambulatory Visit: Payer: Self-pay | Admitting: *Deleted

## 2013-03-14 DIAGNOSIS — R634 Abnormal weight loss: Secondary | ICD-10-CM

## 2013-03-14 MED ORDER — MIRTAZAPINE 15 MG PO TABS: ORAL_TABLET | ORAL | Status: DC

## 2013-03-14 MED ORDER — DULOXETINE HCL 60 MG PO CPEP: 60.0000 mg | ORAL_CAPSULE | Freq: Every day | ORAL | Status: DC

## 2013-03-14 MED ORDER — LEVOTHYROXINE SODIUM 88 MCG PO TABS: 88.0000 ug | ORAL_TABLET | Freq: Every day | ORAL | Status: DC

## 2013-03-14 MED ORDER — LOSARTAN POTASSIUM 100 MG PO TABS: 100.0000 mg | ORAL_TABLET | Freq: Every day | ORAL | Status: DC

## 2013-03-17 ENCOUNTER — Other Ambulatory Visit: Payer: Self-pay

## 2013-03-17 MED ORDER — VITAMIN D (ERGOCALCIFEROL) 1.25 MG (50000 UNIT) PO CAPS: 50000.0000 [IU] | ORAL_CAPSULE | ORAL | Status: DC

## 2013-03-17 NOTE — Telephone Encounter (Signed)
Spoke with [name] (patient's daughter), Per [name] send rx to [facility]

## 2013-03-25 LAB — SPECIMEN STATUS REPORT

## 2013-03-29 ENCOUNTER — Telehealth: Payer: Self-pay | Admitting: *Deleted

## 2013-03-29 NOTE — Telephone Encounter (Signed)
Spoke with insurance person concerning the patient, requested that we use another home health agency.  They were still working with [facility] on a contract, but could use [place], [place], [facility] and [facility]. I informed her that [facility] would be okay to use for this patient. She was given the information needed.

## 2013-03-30 ENCOUNTER — Other Ambulatory Visit: Payer: Self-pay | Admitting: *Deleted

## 2013-03-30 MED ORDER — VITAMIN D (ERGOCALCIFEROL) 1.25 MG (50000 UNIT) PO CAPS: 50000.0000 [IU] | ORAL_CAPSULE | ORAL | Status: DC

## 2013-04-02 LAB — SPECIMEN STATUS REPORT

## 2013-04-02 LAB — VITAMIN D 25 HYDROXY (VIT D DEFICIENCY, FRACTURES): Vit D, 25-Hydroxy: 25.4 ng/mL — ABNORMAL LOW (ref 30.0–100.0)

## 2013-04-06 ENCOUNTER — Telehealth: Payer: Self-pay | Admitting: *Deleted

## 2013-04-06 NOTE — Telephone Encounter (Signed)
Caren Griffins with [facility] called and stated that patient is still depressed even on 2 medications for it and losing weight. Told her that patient needed an appointment to be seen. She stated that she will have the daughter call and schedule

## 2013-05-04 ENCOUNTER — Telehealth: Payer: Self-pay | Admitting: *Deleted

## 2013-05-04 NOTE — Telephone Encounter (Signed)
Dr. Nyoka Cowden received paperwork from [place] for [facility] affair to be filled out. Dr. Nyoka Cowden needs patient to make an appointment to be seen to fill out paperwork. Daughter [name] Notified and they will call office back to schedule an appointment.

## 2013-05-11 ENCOUNTER — Ambulatory Visit (INDEPENDENT_AMBULATORY_CARE_PROVIDER_SITE_OTHER): Payer: Medicare HMO | Admitting: Nurse Practitioner

## 2013-05-11 ENCOUNTER — Encounter: Payer: Self-pay | Admitting: Nurse Practitioner

## 2013-05-11 VITALS — BP 152/110 | HR 79 | Temp 96.7°F | Resp 18 | Wt 119.2 lb

## 2013-05-11 DIAGNOSIS — E039 Hypothyroidism, unspecified: Secondary | ICD-10-CM

## 2013-05-11 DIAGNOSIS — F0280 Dementia in other diseases classified elsewhere without behavioral disturbance: Secondary | ICD-10-CM

## 2013-05-11 DIAGNOSIS — I10 Essential (primary) hypertension: Secondary | ICD-10-CM

## 2013-05-11 DIAGNOSIS — F3289 Other specified depressive episodes: Secondary | ICD-10-CM

## 2013-05-11 DIAGNOSIS — G4700 Insomnia, unspecified: Secondary | ICD-10-CM

## 2013-05-11 DIAGNOSIS — E559 Vitamin D deficiency, unspecified: Secondary | ICD-10-CM

## 2013-05-11 DIAGNOSIS — W19XXXA Unspecified fall, initial encounter: Secondary | ICD-10-CM

## 2013-05-11 DIAGNOSIS — G309 Alzheimer's disease, unspecified: Secondary | ICD-10-CM

## 2013-05-11 DIAGNOSIS — R634 Abnormal weight loss: Secondary | ICD-10-CM

## 2013-05-11 DIAGNOSIS — F329 Major depressive disorder, single episode, unspecified: Secondary | ICD-10-CM

## 2013-05-11 MED ORDER — DONEPEZIL HCL 10 MG PO TABS: ORAL_TABLET | ORAL | Status: DC

## 2013-05-11 MED ORDER — VITAMIN D (ERGOCALCIFEROL) 1.25 MG (50000 UNIT) PO CAPS: ORAL_CAPSULE | ORAL | Status: DC

## 2013-05-11 MED ORDER — HYDROCODONE-ACETAMINOPHEN 5-325 MG PO TABS: 1.0000 | ORAL_TABLET | Freq: Four times a day (QID) | ORAL | Status: DC | PRN

## 2013-05-11 MED ORDER — VITAMIN D (ERGOCALCIFEROL) 1.25 MG (50000 UNIT) PO CAPS: 50000.0000 [IU] | ORAL_CAPSULE | ORAL | Status: DC

## 2013-05-11 NOTE — Patient Instructions (Signed)
Form filled out and we will fax this back to [facility] Will try to get home health PT to come back out to the house to help with pain and gait  Follow up in 3 months

## 2013-05-11 NOTE — Progress Notes (Signed)
Patient ID: [name], female   DOB: Sep 25, 1931, 80 y.o.   MRN: [id number]    No Known Allergies  Chief Complaint  Patient presents with  . Acute Visit    fell x 2 (wed, )back pain into right hip    HPI: Patient is a 80 y.o. female 2seen in the office today for form from [employer] to be filled out; pt requiring more care at home and family needs assistance; had another fall, now with pain unrelieved by tramadol.  Started taking vit d but did not get refill after 1 month conts with decrease appetite.   Review of Systems:  Review of Systems  Constitutional: Positive for weight loss. Negative for fever and chills.  Eyes: Negative for blurred vision.  Respiratory: Negative for cough and shortness of breath.   Cardiovascular: Negative for chest pain, palpitations and leg swelling.  Gastrointestinal: Negative for heartburn, nausea, vomiting, abdominal pain, diarrhea and constipation.  Genitourinary: Negative for dysuria, urgency and frequency.  Musculoskeletal: Positive for back pain, falls and myalgias.  Skin: Negative.   Neurological: Positive for weakness. Negative for dizziness and headaches.  Psychiatric/Behavioral: Positive for depression and memory loss.     Past Medical History  Diagnosis Date  . GERD (gastroesophageal reflux disease)   . Hypertension   . Personal history of fall   . Edema   . Unspecified hereditary and idiopathic peripheral neuropathy   . Pain in thoracic spine   . Abnormality of gait   . Urinary frequency   . Other protein-calorie malnutrition   . Gastric ulcer, unspecified as acute or chronic, without mention of hemorrhage, perforation, or obstruction   . Loss of weight   . Closed fracture of seventh cervical vertebra without mention of spinal cord injury   . Alzheimer's disease   . Unspecified vitamin D deficiency   . External hemorrhoids without mention of complication   . Edema   . Unspecified hypothyroidism   . Depressive disorder, not  elsewhere classified   . Unspecified constipation   . Calculus of kidney   . Osteoarthrosis, unspecified whether generalized or localized, unspecified site   . Insomnia, unspecified    Past Surgical History  Procedure Laterality Date  . Wrist surgery Right 2013   Social History:   reports that she has never smoked. She does not have any smokeless tobacco history on file. She reports that she does not drink alcohol or use illicit drugs.  Family History  Problem Relation Age of Onset  . Cancer Mother   . CVA Father   . Cancer Brother   . Alcohol abuse Sister     Medications: Patient's Medications  New Prescriptions   DONEPEZIL (ARICEPT) 10 MG TABLET    1/2 tablet every day at bedtime for 4 weeks then increase to 1 tablet every day at bedtime  Previous Medications   DULOXETINE (CYMBALTA) 60 MG CAPSULE    Take 1 capsule (60 mg total) by mouth daily.   LEVOTHYROXINE (SYNTHROID, LEVOTHROID) 88 MCG TABLET    Take 1 tablet (88 mcg total) by mouth daily before breakfast.   MEMANTINE HCL ER 28 MG CP24    Take 28 mg by mouth daily.   MIRTAZAPINE (REMERON) 15 MG TABLET    1/2- 1 tablet every night at bedtime   TRAMADOL (ULTRAM) 50 MG TABLET    1-2 tablets every 6-8 hours as needed for pain/max of 6 tablets in 24 hours  Modified Medications   Modified Medication Previous Medication  HYDROCODONE-ACETAMINOPHEN (NORCO) 5-325 MG PER TABLET HYDROcodone-acetaminophen (NORCO) 5-325 MG per tablet      Take 1 tablet by mouth every 6 (six) hours as needed for moderate pain or severe pain.    Take 1 tablet by mouth every 6 (six) hours as needed for moderate pain or severe pain.   VITAMIN D, ERGOCALCIFEROL, (DRISDOL) 50000 UNITS CAPS CAPSULE Vitamin D, Ergocalciferol, (DRISDOL) 50000 UNITS CAPS capsule      Take 1 capsule (50,000 Units total) by mouth every 7 (seven) days.    Take 1 capsule (50,000 Units total) by mouth every 7 (seven) days.  Discontinued Medications   LOSARTAN (COZAAR) 100 MG TABLET     Take 1 tablet (100 mg total) by mouth daily.     Physical Exam:  Filed Vitals:   05/11/13 1542  BP: 152/110  Pulse: 79  Temp: 96.7 F (35.9 C)  TempSrc: Oral  Resp: 18  Weight: 119 lb 3.2 oz (54.069 kg)  SpO2: 92%    Physical Exam  Constitutional:  Frail female in NAD  HENT:  Mouth/Throat: Oropharynx is clear and moist. No oropharyngeal exudate.  Neck: Normal range of motion. Neck supple. No JVD present. No thyromegaly present.  Cardiovascular: Normal rate, regular rhythm and normal heart sounds.   Pulmonary/Chest: Effort normal and breath sounds normal. No respiratory distress. She has no wheezes.  Abdominal: Soft. Bowel sounds are normal. She exhibits no distension. There is no tenderness. There is no rebound and no guarding.  Musculoskeletal: She exhibits tenderness (tender to posterior rib cage on right side and to surrounding area; skin intact with faint bruise; no edema). She exhibits no edema.  Lymphadenopathy:    She has no cervical adenopathy.  Neurological: She is alert.  Unsteady; slow gait  Skin: Skin is warm and dry.  Psychiatric: She exhibits a depressed mood (tearful).     Labs reviewed: Basic Metabolic Panel:  Recent Labs  09/28/12 0849 02/16/13 2159 03/10/13 1700  NA 144 143 143  K 4.5 3.2* 3.6  CL 104 103 101  CO2 27  --  27  GLUCOSE 96 92 91  BUN 26 17 23   CREATININE 1.06* 0.90 0.80  CALCIUM 9.7  --  9.4  TSH 1.780  --  3.430   Liver Function Tests:  Recent Labs  09/28/12 0849 03/10/13 1700  AST 24 17  ALT 14 5  ALKPHOS 102 125*  BILITOT 0.4 0.3  PROT 6.7 7.0   No results found for this basename: LIPASE, AMYLASE,  in the last 8760 hours No results found for this basename: AMMONIA,  in the last 8760 hours CBC:  Recent Labs  02/16/13 2159 03/10/13 1700  WBC  --  6.2  NEUTROABS  --  3.8  HGB 14.6 12.9  HCT 43.0 38.5  MCV  --  100*  PLT  --  219   Lipid Panel: No results found for this basename: CHOL, HDL, LDLCALC,  TRIG, CHOLHDL, LDLDIRECT,  in the last 8760 hours TSH:  Recent Labs  09/28/12 0849 03/10/13 1700  TSH 1.780 3.430   A1C: No components found with this basename: A1C,    Assessment/Plan 1. Fall -now with pain in back left rib area; pain severe, tramadol not working effecting gait and balance, also not wanting to get up or do anything - form filled out for more assistance - HYDROcodone-acetaminophen ([place]) 5-325 MG per tablet; Take 1 tablet by mouth every 6 (six) hours as needed for moderate pain or severe pain.  Dispense: 30 tablet; Refill: 0-  Also has tramadol but this is not working at this time  - Ambulatory referral to [facility]  2. Alzheimer's disease -memory loss remains stable; pt with ongoing weight loss despite supplements and remeron  - donepezil (ARICEPT) 10 MG tablet; 1/2 tablet every day at bedtime for 4 weeks then increase to 1 tablet every day at bedtime  Dispense: 90 tablet; Refill: 0  3. Insomnia, unspecified -getting days and nights mixed up; not on good routine, sedentary due to fall; does not need medication to make her more drowsy which could cause more fal  4. Unspecified vitamin D deficiency -took vit D for 4 weeks due to cost did not cont; however spoke with daughter about low level and appropriate dose needed she will restart Vit d 50,000 units weekly  5. Unspecified essential hypertension -elevated today due to pain, not on routine blood pressure medicaitons  6. Unspecified hypothyroidism -last TSH stable conts on 88 mcg levothyroxine  7. Loss of weight -cont with weight loss despite Remeron, discussed supplements   8. Depressive disorder, not elsewhere classified -improved however now with recent fall daughter worried about increase in depression, will cont to follow

## 2013-05-17 DIAGNOSIS — IMO0001 Reserved for inherently not codable concepts without codable children: Secondary | ICD-10-CM

## 2013-05-17 DIAGNOSIS — G609 Hereditary and idiopathic neuropathy, unspecified: Secondary | ICD-10-CM

## 2013-05-17 DIAGNOSIS — M6281 Muscle weakness (generalized): Secondary | ICD-10-CM

## 2013-05-17 DIAGNOSIS — IMO0002 Reserved for concepts with insufficient information to code with codable children: Secondary | ICD-10-CM

## 2013-05-18 ENCOUNTER — Telehealth: Payer: Self-pay | Admitting: *Deleted

## 2013-05-18 NOTE — Telephone Encounter (Signed)
Looks good - thank you.

## 2013-05-18 NOTE — Telephone Encounter (Signed)
Patient's daughter called to request that we get in touch with [facility] about her mother medication. Spoke with the [facility] pharmacist and she explained to me that the medication Donepezil could not be cut in half, she requested that the provider change her order. I spoke with [name] and she agreed with changing the script: Donepezil 5 mg- Take 1 tab  daily for 4 weeks, then increase to 2 tabs  daily. When patient refills the  medication then change the medication to 10 mg daily per [name].

## 2013-05-31 ENCOUNTER — Telehealth: Payer: Self-pay | Admitting: *Deleted

## 2013-05-31 NOTE — Telephone Encounter (Signed)
(  see below)  Later that same day she called her daughter and apologized to her and stated that she didn't know what came over her. Patient is currently taking Cymbalta and daughter wants to know if it should be increased.   Pharmacy: [facility]

## 2013-05-31 NOTE — Telephone Encounter (Signed)
Error

## 2013-05-31 NOTE — Telephone Encounter (Signed)
Last week lost a niece. Daughter states that she called her last week just crying so hard because she thought she lost her husband's wedding band. About an hour later she called back stating that she was going to kill herself and take a bunch of pills. A friend went over to check on her and calmed her down and gave her a pain pill. Daughter states that she has never seen her act that way. Later that same

## 2013-05-31 NOTE — Telephone Encounter (Signed)
Patient is visiting relatives in [place] right now, so daughter will call when she gets back and schedule an appointment.

## 2013-05-31 NOTE — Telephone Encounter (Signed)
Will need follow up appt

## 2013-06-08 ENCOUNTER — Other Ambulatory Visit: Payer: Self-pay | Admitting: *Deleted

## 2013-06-08 DIAGNOSIS — F0280 Dementia in other diseases classified elsewhere without behavioral disturbance: Secondary | ICD-10-CM

## 2013-06-08 DIAGNOSIS — G309 Alzheimer's disease, unspecified: Principal | ICD-10-CM

## 2013-06-08 MED ORDER — DONEPEZIL HCL 10 MG PO TABS: ORAL_TABLET | ORAL | Status: DC

## 2013-06-22 ENCOUNTER — Telehealth: Payer: Self-pay | Admitting: *Deleted

## 2013-06-22 NOTE — Telephone Encounter (Signed)
Is she not taking the remeron? This is for appetite and on her pt list

## 2013-06-22 NOTE — Telephone Encounter (Signed)
Patient daughter called and stated that her mother is not eating well and a friend told her about Megace and wanted to know if they could try this. Please Advise.

## 2013-06-22 NOTE — Telephone Encounter (Signed)
Patient daughter will check with her sister and make sure patient is taking the Remeron.

## 2013-07-13 ENCOUNTER — Ambulatory Visit (INDEPENDENT_AMBULATORY_CARE_PROVIDER_SITE_OTHER): Payer: Medicare HMO | Admitting: Internal Medicine

## 2013-07-13 ENCOUNTER — Encounter: Payer: Self-pay | Admitting: Internal Medicine

## 2013-07-13 VITALS — BP 152/92 | HR 80 | Temp 98.5°F | Resp 20 | Wt 112.0 lb

## 2013-07-13 DIAGNOSIS — R1906 Epigastric swelling, mass or lump: Secondary | ICD-10-CM | POA: Insufficient documentation

## 2013-07-13 DIAGNOSIS — R634 Abnormal weight loss: Secondary | ICD-10-CM

## 2013-07-13 DIAGNOSIS — E039 Hypothyroidism, unspecified: Secondary | ICD-10-CM

## 2013-07-13 DIAGNOSIS — I739 Peripheral vascular disease, unspecified: Secondary | ICD-10-CM

## 2013-07-13 DIAGNOSIS — I10 Essential (primary) hypertension: Secondary | ICD-10-CM

## 2013-07-13 DIAGNOSIS — F0280 Dementia in other diseases classified elsewhere without behavioral disturbance: Secondary | ICD-10-CM

## 2013-07-13 DIAGNOSIS — F3289 Other specified depressive episodes: Secondary | ICD-10-CM

## 2013-07-13 DIAGNOSIS — G4700 Insomnia, unspecified: Secondary | ICD-10-CM

## 2013-07-13 DIAGNOSIS — G309 Alzheimer's disease, unspecified: Secondary | ICD-10-CM

## 2013-07-13 DIAGNOSIS — F329 Major depressive disorder, single episode, unspecified: Secondary | ICD-10-CM

## 2013-07-13 MED ORDER — AMITRIPTYLINE HCL 25 MG PO TABS: ORAL_TABLET | ORAL | Status: DC

## 2013-07-13 MED ORDER — LOSARTAN POTASSIUM 100 MG PO TABS: ORAL_TABLET | ORAL | Status: DC

## 2013-07-13 MED ORDER — DONEPEZIL HCL 10 MG PO TABS: ORAL_TABLET | ORAL | Status: DC

## 2013-07-13 NOTE — Progress Notes (Signed)
Patient ID: [name], female   DOB: 1931-10-22, 80 y.o.   MRN: [id number]    Location:  [facility]  Place of Service: OFFICE   No Known Allergies  Chief Complaint  Patient presents with  . Acute Visit    a lot of stomach pain after eating, heavy feeling lasts for a day or two    HPI:  Has been complaining of epigastric discomfort for months. Says that food seems to stop after she eats. Then she gets a hard spot in the eigastrium. Denies heartburn or reflux symptoms. Denies change in stools. She is chronically constipated. Denies nausea. Has been losing weight steadily and is down to 112#, presumably because of poor intake. Does not ever feel like eating. Has been on mirtazapine, but it does not help.   She has been depressed. Cymbalta may have helped a little.  Stayed out of town for a while. Medications were changed, She had donepezil added to her memantine.  Complains of legs feeling cold and numb. Denies leg pain. Has prior diagnosis of restless legs, but this seems different to her daughter.  Current medications have not helped her insomnia.  Medications: Patient's Medications  New Prescriptions   AMITRIPTYLINE (ELAVIL) 25 MG TABLET    One or two at bed to help sleep and appetite   DONEPEZIL (ARICEPT) 10 MG TABLET    One daily to help preserve memory   LOSARTAN (COZAAR) 100 MG TABLET    One daily to control BP  Previous Medications   DULOXETINE (CYMBALTA) 60 MG CAPSULE    Take 1 capsule (60 mg total) by mouth daily.   LEVOTHYROXINE (SYNTHROID, LEVOTHROID) 88 MCG TABLET    Take 1 tablet (88 mcg total) by mouth daily before breakfast.   MEMANTINE HCL ER 28 MG CP24    Take 28 mg by mouth daily.   TRAMADOL (ULTRAM) 50 MG TABLET    1-2 tablets every 6-8 hours as needed for pain/max of 6 tablets in 24 hours   VITAMIN D, ERGOCALCIFEROL, (DRISDOL) 50000 UNITS CAPS CAPSULE    Take 1 capsule (50,000 Units total) by mouth every 7 (seven) days.  Modified Medications   No medications on  file  Discontinued Medications   DONEPEZIL (ARICEPT) 10 MG TABLET    Take one tablet by mouth at bedtime for memory   HYDROCODONE-ACETAMINOPHEN (NORCO) 5-325 MG PER TABLET    Take 1 tablet by mouth every 6 (six) hours as needed for moderate pain or severe pain.   MIRTAZAPINE (REMERON) 15 MG TABLET    1/2- 1 tablet every night at bedtime     Review of Systems  Constitutional: Positive for activity change, appetite change and fatigue. Negative for fever.       Thin. Frail. Elderly. Poor appetite  HENT: Positive for hearing loss. Negative for congestion, mouth sores, sinus pressure and sore throat.   Eyes: Negative.   Respiratory: Negative.   Cardiovascular: Negative.   Gastrointestinal:       Epigastric mass. More prominent after eating.  Endocrine: Negative.   Genitourinary: Negative.   Musculoskeletal:       Right Forearm Due To a Fractured Wrist 5 Weeks Prior to This Visit. Fracture Was Pinned by Dr. Ann Lions. She Was Climbing on a [employer] and [place] [address] when the Injury Occurred. Unstable gait.  Skin: Negative.   Allergic/Immunologic: Negative.   Neurological:       Poor balance. Demented.  Hematological: Negative.   Psychiatric/Behavioral: Positive for confusion, sleep disturbance, dysphoric  mood and decreased concentration. The patient is nervous/anxious.        Brief episodes of agitation. Very poor memory.    Filed Vitals:   07/13/13 1540  BP: 152/92  Pulse: 80  Temp: 98.5 F (36.9 C)  TempSrc: Oral  Resp: 20  Weight: 112 lb (50.803 kg)  SpO2: 95%   There is no height on file to calculate BMI.  Physical Exam  Constitutional:  Thin. Frail.  HENT:  Head: Normocephalic and atraumatic.  Right Ear: External ear normal.  Left Ear: External ear normal.  Nose: Nose normal.  Mouth/Throat: Oropharynx is clear and moist.  Eyes: Conjunctivae and EOM are normal. Pupils are equal, round, and reactive to light.  Neck: No JVD present. No tracheal deviation present. No  thyromegaly present.  Cardiovascular: Normal rate, regular rhythm and normal heart sounds.  Exam reveals no gallop and no friction rub.   No murmur heard. Dimiinshede pedal pulses.  Pulmonary/Chest: No respiratory distress. She has no wheezes. She has no rales.  Abdominal: She exhibits no distension and no mass. There is no tenderness.  2.5 inch epigastric mass that is tender to palpation. I do not feel AAA.  Musculoskeletal: Normal range of motion. She exhibits edema (Trace bipedal edema).  Lymphadenopathy:    She has no cervical adenopathy.  Neurological: No cranial nerve deficit. Coordination abnormal.  Severe memory loss. Reduce sensation to touch and vibration in both great toes. No tremor. Mini-Mental Status exam 09/12/09 was 25/30. Mild clock drawing test.  Skin: No rash noted. No erythema. No pallor.  Psychiatric:  Poor judgment. Impulsive.     Labs reviewed: No visits with results within 3 Month(s) from this visit. Latest known visit with results is:  Office Visit on 03/10/2013  Component Date Value Ref Range Status  . WBC 03/10/2013 6.2  3.4 - 10.8 x10E3/uL Final  . RBC 03/10/2013 3.87  3.77 - 5.28 x10E6/uL Final  . Hemoglobin 03/10/2013 12.9  11.1 - 15.9 g/dL Final  . HCT 03/10/2013 38.5  34.0 - 46.6 % Final  . MCV 03/10/2013 100* 79 - 97 fL Final  . MCH 03/10/2013 33.3* 26.6 - 33.0 pg Final  . MCHC 03/10/2013 33.5  31.5 - 35.7 g/dL Final  . RDW 03/10/2013 13.4  12.3 - 15.4 % Final  . Platelets 03/10/2013 219  150 - 379 x10E3/uL Final  . Neutrophils Relative % 03/10/2013 62   Final  . Lymphs 03/10/2013 29   Final  . Monocytes 03/10/2013 6   Final  . Eos 03/10/2013 2   Final  . Basos 03/10/2013 1   Final  . Neutrophils Absolute 03/10/2013 3.8  1.4 - 7.0 x10E3/uL Final  . Lymphocytes Absolute 03/10/2013 1.8  0.7 - 3.1 x10E3/uL Final  . Monocytes Absolute 03/10/2013 0.4  0.1 - 0.9 x10E3/uL Final  . Eosinophils Absolute 03/10/2013 0.1  0.0 - 0.4 x10E3/uL Final  .  Basophils Absolute 03/10/2013 0.0  0.0 - 0.2 x10E3/uL Final  . Immature Granulocytes 03/10/2013 0   Final  . Immature Grans (Abs) 03/10/2013 0.0  0.0 - 0.1 x10E3/uL Final  . Glucose 03/10/2013 91  65 - 99 mg/dL Final  . BUN 03/10/2013 23  8 - 27 mg/dL Final  . Creatinine, Ser 03/10/2013 0.80  0.57 - 1.00 mg/dL Final  . GFR calc non Af Amer 03/10/2013 70  >59 mL/min/1.73 Final  . GFR calc Af Amer 03/10/2013 81  >59 mL/min/1.73 Final  . BUN/Creatinine Ratio 03/10/2013 29* 11 - 26 Final  .  Sodium 03/10/2013 143  134 - 144 mmol/L Final  . Potassium 03/10/2013 3.6  3.5 - 5.2 mmol/L Final  . Chloride 03/10/2013 101  97 - 108 mmol/L Final  . CO2 03/10/2013 27  18 - 29 mmol/L Final  . Calcium 03/10/2013 9.4  8.7 - 10.3 mg/dL Final                 **Please note reference interval change**  . Total Protein 03/10/2013 7.0  6.0 - 8.5 g/dL Final  . Albumin 03/10/2013 4.5  3.5 - 4.8 g/dL Final  . Globulin, Total 03/10/2013 2.5  1.5 - 4.5 g/dL Final  . Albumin/Globulin Ratio 03/10/2013 1.8  1.1 - 2.5 Final  . Total Bilirubin 03/10/2013 0.3  0.0 - 1.2 mg/dL Final  . Alkaline Phosphatase 03/10/2013 125* 39 - 117 IU/L Final  . AST 03/10/2013 17  0 - 40 IU/L Final  . ALT 03/10/2013 5  0 - 32 IU/L Final  . TSH 03/10/2013 3.430  0.450 - 4.500 uIU/mL Final  . specimen status report 03/10/2013 Comment   Corrected   Comment: Verbal Order                          See below:                          Comment:                          The [place] States Code of [employer] requires a written and                          signed request be forwarded to a laboratory following a verbal order                          of a laboratory test.  Please assist [place] to meet this requirement and                          to complete our records.                          Date:______________________________                          ICD-9/Diagnosis Code(s):______________________________________________                           Physician or Authorized Designee:_____________________________________                                                                        Please Print                          Physician or Authorized Designee Signature:                          ______________________________________________________________________  Your Signature Confirms Your Order Of The Test(s) Listed                          Please provide requested information and fax to [phone].                          Additional Test(s) Requested                          Comment:                          Test(s) added per Tarri Glenn at account 03-11-2013                          Logged by Jeryl Columbia                          Test# [id number] Vitamin D, 25-Hydroxy                          Diagnosis Codes Provided                             268.9                          Verbal Order                          THIS IS THE FINAL NOTICE REQUESTING THIS INFORMATION; IF WE DO NOT                          RECEIVE THIS INFORMATION WITHIN SEVEN DAYS, THE TESTS WILL BE                          BILLED TO YOUR ACCOUNT.  . Vit D, 25-Hydroxy 03/10/2013 25.4* 30.0 - 100.0 ng/mL Final   Comment: Vitamin D deficiency has been defined by the [place] practice guideline as a                          level of serum 25-OH vitamin D less than 20 ng/mL (1,2).                          The Endocrine Society went on to further define vitamin D                          insufficiency as a level between 21 and 29 ng/mL (2).                          1. IOM (Institute of Medicine). 2010. Dietary reference  intakes for calcium and D. [place]: The                             [employer].                          2. Holick MF, Binkley [place], Bischoff-Ferrari HA, et al.                             Evaluation, treatment, and  prevention of vitamin D                             deficiency: an Endocrine Society clinical practice                             guideline. JCEM. 2011 Jul; 96(7):1911-30.  Marland Kitchen specimen status report 03/10/2013 Comment   Final   Comment: Written Authorization                          Written Authorization                          No Written Authorization Received.      Assessment/Plan  1. Loss of weight Evaluate epigastric mass. Depending on results of tests, she may need to see her gastroenterologist, Dr. Oletta Lamas. - amitriptyline (ELAVIL) 25 MG tablet; One or two at bed to help sleep and appetite  Dispense: 180 tablet; Refill: 1 - Comprehensive metabolic panel - Amylase - Lipase - CT Abdomen Pelvis W Contrast; Future  2. Peripheral vascular disease, unspecified - Ankle brachial index; Future - [place] ABI W/WO TBI; Future  3. Depressive disorder, not elsewhere classified Stop mirtazapine. Continue Cymbalta for now, but consider reduction in Cymbalta when amitriptyline is started.  4. Alzheimer's disease Continue memantine and  donepezil   5. HYPERTENSION - losartan (COZAAR) 100 MG tablet; One daily to control BP  Dispense: 90 tablet; Refill: 3  6. HYPOTHYROIDISM - TSH  7. Insomnia, unspecified - amitriptyline (ELAVIL) 25 MG tablet; One or two at bed to help sleep and appetite  Dispense: 180 tablet; Refill: 1  8. Epigastric mass - Amylase - Lipase - CT Abdomen Pelvis W Contrast; Future

## 2013-07-13 NOTE — Patient Instructions (Addendum)
Use medications as listed.  Cymbalta can be given every other day for a week when she starts amitriptyline.  Stop hydrocodone and use tramadol for pain. Stop mirtazapine.

## 2013-07-14 LAB — COMPREHENSIVE METABOLIC PANEL
ALT: 32 IU/L (ref 0–32)
AST: 30 IU/L (ref 0–40)
Albumin/Globulin Ratio: 1.9 (ref 1.1–2.5)
Albumin: 4.1 g/dL (ref 3.5–4.7)
Alkaline Phosphatase: 87 IU/L (ref 39–117)
BUN/Creatinine Ratio: 17 (ref 11–26)
BUN: 18 mg/dL (ref 8–27)
CO2: 28 mmol/L (ref 18–29)
Calcium: 9.3 mg/dL (ref 8.7–10.3)
Chloride: 102 mmol/L (ref 97–108)
Creatinine, Ser: 1.08 mg/dL — ABNORMAL HIGH (ref 0.57–1.00)
GFR calc Af Amer: 56 mL/min/{1.73_m2} — ABNORMAL LOW (ref >59)
GFR calc non Af Amer: 49 mL/min/{1.73_m2} — ABNORMAL LOW (ref >59)
Globulin, Total: 2.2 g/dL (ref 1.5–4.5)
Glucose: 83 mg/dL (ref 65–99)
Potassium: 3.9 mmol/L (ref 3.5–5.2)
Sodium: 144 mmol/L (ref 134–144)
Total Bilirubin: 0.5 mg/dL (ref 0.0–1.2)
Total Protein: 6.3 g/dL (ref 6.0–8.5)

## 2013-07-14 LAB — TSH: TSH: 6.02 u[IU]/mL — ABNORMAL HIGH (ref 0.450–4.500)

## 2013-07-14 LAB — LIPASE: Lipase: 43 U/L (ref 0–59)

## 2013-07-14 LAB — AMYLASE: Amylase: 90 U/L (ref 31–124)

## 2013-07-14 NOTE — Addendum Note (Signed)
Addended by: Rafael Bihari A on: 07/14/2013 08:51 AM   Modules accepted: Orders

## 2013-07-18 ENCOUNTER — Ambulatory Visit
Admission: RE | Admit: 2013-07-18 | Discharge: 2013-07-18 | Disposition: A | Discharge status: Home / Self Care | Payer: Medicare PPO | Source: Ambulatory Visit | Attending: Internal Medicine | Admitting: Internal Medicine

## 2013-07-18 DIAGNOSIS — I739 Peripheral vascular disease, unspecified: Secondary | ICD-10-CM

## 2013-07-19 ENCOUNTER — Other Ambulatory Visit: Payer: Medicare PPO

## 2013-07-20 ENCOUNTER — Ambulatory Visit
Admission: RE | Admit: 2013-07-20 | Discharge: 2013-07-20 | Disposition: A | Discharge status: Home / Self Care | Payer: Medicare PPO | Source: Ambulatory Visit | Attending: Internal Medicine | Admitting: Internal Medicine

## 2013-07-20 ENCOUNTER — Inpatient Hospital Stay: Admission: RE | Admit: 2013-07-20 | Payer: Medicare PPO | Source: Ambulatory Visit

## 2013-07-20 ENCOUNTER — Other Ambulatory Visit: Payer: Medicare PPO

## 2013-07-20 ENCOUNTER — Other Ambulatory Visit: Payer: Self-pay | Admitting: Internal Medicine

## 2013-07-20 DIAGNOSIS — R1906 Epigastric swelling, mass or lump: Secondary | ICD-10-CM

## 2013-07-20 DIAGNOSIS — R634 Abnormal weight loss: Secondary | ICD-10-CM

## 2013-07-20 MED ORDER — IOHEXOL 300 MG/ML  SOLN
100.0000 mL | Freq: Once | INTRAMUSCULAR | Status: AC | PRN
  Administered 2013-07-20: 100 mL via INTRAVENOUS

## 2013-07-27 ENCOUNTER — Encounter: Payer: Self-pay | Admitting: Internal Medicine

## 2013-07-27 ENCOUNTER — Ambulatory Visit (INDEPENDENT_AMBULATORY_CARE_PROVIDER_SITE_OTHER): Payer: Commercial Managed Care - HMO | Admitting: Internal Medicine

## 2013-07-27 VITALS — BP 150/100 | HR 93 | Temp 98.4°F | Resp 20 | Wt 108.8 lb

## 2013-07-27 DIAGNOSIS — R1906 Epigastric swelling, mass or lump: Secondary | ICD-10-CM

## 2013-07-27 DIAGNOSIS — F3289 Other specified depressive episodes: Secondary | ICD-10-CM

## 2013-07-27 DIAGNOSIS — R634 Abnormal weight loss: Secondary | ICD-10-CM

## 2013-07-27 DIAGNOSIS — IMO0002 Reserved for concepts with insufficient information to code with codable children: Secondary | ICD-10-CM

## 2013-07-27 DIAGNOSIS — G309 Alzheimer's disease, unspecified: Secondary | ICD-10-CM

## 2013-07-27 DIAGNOSIS — F0280 Dementia in other diseases classified elsewhere without behavioral disturbance: Secondary | ICD-10-CM

## 2013-07-27 DIAGNOSIS — K5900 Constipation, unspecified: Secondary | ICD-10-CM

## 2013-07-27 DIAGNOSIS — F329 Major depressive disorder, single episode, unspecified: Secondary | ICD-10-CM

## 2013-07-27 DIAGNOSIS — E039 Hypothyroidism, unspecified: Secondary | ICD-10-CM

## 2013-07-27 DIAGNOSIS — G4700 Insomnia, unspecified: Secondary | ICD-10-CM

## 2013-07-27 DIAGNOSIS — I10 Essential (primary) hypertension: Secondary | ICD-10-CM

## 2013-07-27 MED ORDER — SENNA 8.6 MG PO TABS: ORAL_TABLET | ORAL | Status: DC

## 2013-07-27 MED ORDER — POLYETHYLENE GLYCOL 3350 17 GM/SCOOP PO POWD: ORAL | Status: DC

## 2013-07-27 MED ORDER — AMITRIPTYLINE HCL 25 MG PO TABS: ORAL_TABLET | ORAL | Status: DC

## 2013-07-27 NOTE — Progress Notes (Signed)
And and and there is & in the is a 1 Is an old and is in and in [place] and is in a good is an and and is a Patient ID: [name], female   DOB: 1931/12/09, 80 y.o.   MRN: [id number]    Location:    [facility]  Place of Service:  OFFICE    No Known Allergies  Chief Complaint  Patient presents with  . Follow-up    HPI:  Loss of weight - not eating well  Insomnia, unspecified - up and down all night  Constipation - chronic  Fracture of vertebra: Moderate residual pain  HYPOTHYROIDISM: Compensated  HYPERTENSION: Moderate elevations in both the systolic and diastolic blood pressure  Epigastric mass: Previous palpable mass in the epigastrium seems to have disappeared.  Depressive disorder, not elsewhere classified: Sad. Anorectic.  Alzheimer's disease: Progressive memory loss.    Medications: Patient's Medications  New Prescriptions   No medications on file  Previous Medications   AMITRIPTYLINE (ELAVIL) 25 MG TABLET    One or two at bed to help sleep and appetite   DONEPEZIL (ARICEPT) 10 MG TABLET    One daily to help preserve memory   DULOXETINE (CYMBALTA) 60 MG CAPSULE    Take 1 capsule (60 mg total) by mouth daily.   LEVOTHYROXINE (SYNTHROID, LEVOTHROID) 88 MCG TABLET    Take 1 tablet (88 mcg total) by mouth daily before breakfast.   LOSARTAN (COZAAR) 100 MG TABLET    One daily to control BP   MEMANTINE HCL ER 28 MG CP24    Take 28 mg by mouth daily.   TRAMADOL (ULTRAM) 50 MG TABLET    1-2 tablets every 6-8 hours as needed for pain/max of 6 tablets in 24 hours   VITAMIN D, ERGOCALCIFEROL, (DRISDOL) 50000 UNITS CAPS CAPSULE    Take 1 capsule (50,000 Units total) by mouth every 7 (seven) days.  Modified Medications   No medications on file  Discontinued Medications   No medications on file     Review of Systems  Constitutional: Positive for activity change, appetite change and fatigue. Negative for fever.       Thin. Frail. Elderly. Poor appetite  HENT: Positive  for hearing loss. Negative for congestion, mouth sores, sinus pressure and sore throat.   Eyes: Negative.   Respiratory: Negative.   Cardiovascular: Negative.   Gastrointestinal:       Epigastric mass. More prominent after eating.  Endocrine: Negative.   Genitourinary: Negative.   Musculoskeletal:       Previous right forearm injury has healed. Unstable gait.  Skin: Negative.   Allergic/Immunologic: Negative.   Neurological:       Poor balance. Demented.  Hematological: Negative.   Psychiatric/Behavioral: Positive for confusion, sleep disturbance, dysphoric mood and decreased concentration. The patient is nervous/anxious.        Brief episodes of agitation. Very poor memory.    Filed Vitals:   07/27/13 1604  BP: 150/100  Pulse: 93  Temp: 98.4 F (36.9 C)  TempSrc: Oral  Resp: 20  Weight: 108 lb 12.8 oz (49.351 kg)  SpO2: 97%   There is no height on file to calculate BMI.  Physical Exam  Constitutional:  Thin. Frail.  HENT:  Head: Normocephalic and atraumatic.  Right Ear: External ear normal.  Left Ear: External ear normal.  Nose: Nose normal.  Mouth/Throat: Oropharynx is clear and moist.  Eyes: Conjunctivae and EOM are normal. Pupils are equal, round, and reactive to light.  Neck: No JVD present. No tracheal deviation present. No thyromegaly present.  Cardiovascular: Normal rate, regular rhythm and normal heart sounds.  Exam reveals no gallop and no friction rub.   No murmur heard. Diminshed pedal pulses.  Pulmonary/Chest: No respiratory distress. She has no wheezes. She has no rales.  Abdominal: She exhibits no distension and no mass. There is no tenderness.  Previously described 2.5 inch epigastric mass that is tender to palpation is not palpable today. I do not feel AAA.  Musculoskeletal: Normal range of motion. She exhibits edema (Trace bipedal edema).  Lymphadenopathy:    She has no cervical adenopathy.  Neurological: No cranial nerve deficit. Coordination  abnormal.  Severe memory loss. Reduce sensation to touch and vibration in both great toes. No tremor. Mini-Mental Status exam 09/12/09 was 25/30. Mild clock drawing test.  Skin: No rash noted. No erythema. No pallor.  Psychiatric:  Poor judgment. Impulsive.     Labs reviewed: Office Visit on 07/13/2013  Component Date Value Ref Range Status  . TSH 07/13/2013 6.020* 0.450 - 4.500 uIU/mL Final  . Glucose 07/13/2013 83  65 - 99 mg/dL Final  . BUN 07/13/2013 18  8 - 27 mg/dL Final  . Creatinine, Ser 07/13/2013 1.08* 0.57 - 1.00 mg/dL Final  . GFR calc non Af Amer 07/13/2013 49* >59 mL/min/1.73 Final  . GFR calc Af Amer 07/13/2013 56* >59 mL/min/1.73 Final  . BUN/Creatinine Ratio 07/13/2013 17  11 - 26 Final  . Sodium 07/13/2013 144  134 - 144 mmol/L Final  . Potassium 07/13/2013 3.9  3.5 - 5.2 mmol/L Final  . Chloride 07/13/2013 102  97 - 108 mmol/L Final  . CO2 07/13/2013 28  18 - 29 mmol/L Final  . Calcium 07/13/2013 9.3  8.7 - 10.3 mg/dL Final  . Total Protein 07/13/2013 6.3  6.0 - 8.5 g/dL Final  . Albumin 07/13/2013 4.1  3.5 - 4.7 g/dL Final  . Globulin, Total 07/13/2013 2.2  1.5 - 4.5 g/dL Final  . Albumin/Globulin Ratio 07/13/2013 1.9  1.1 - 2.5 Final  . Total Bilirubin 07/13/2013 0.5  0.0 - 1.2 mg/dL Final  . Alkaline Phosphatase 07/13/2013 87  39 - 117 IU/L Final  . AST 07/13/2013 30  0 - 40 IU/L Final  . ALT 07/13/2013 32  0 - 32 IU/L Final  . Amylase 07/13/2013 90  31 - 124 U/L Final  . Lipase 07/13/2013 43  0 - 59 U/L Final      Assessment/Plan  1. Loss of weight - amitriptyline (ELAVIL) 25 MG tablet; One or two at bed to help sleep and appetite  Dispense: 180 tablet; Refill: 1  2. Insomnia, unspecified - amitriptyline (ELAVIL) 25 MG tablet; One or two at bed to help sleep and appetite  Dispense: 180 tablet; Refill: 1  3. Constipation - polyethylene glycol powder (GLYCOLAX/MIRALAX) powder; 17 grams daly in 4-8 oz water to prevent constipation  Dispense: 3350 g;  Refill: 1 - senna (SENOKOT) 8.6 MG TABS tablet; 2 at bed to help prevent constipation  Dispense: 100 tablet; Refill: 0  4. Fracture of vertebra Mild back discomfort persists  5. HYPOTHYROIDISM Compensated  6. HYPERTENSION Continue to observe without medication change this visit  7. Epigastric mass Continue to observe. This may have been fecal material. On 07/20/13, the CT scan of the abdomen failed to show a mass. The remainder of the report is as follows:  1. No abdominal or pelvic mass or adenopathy is seen.  2. Varices within the left  upper quadrant.  3. Area of low attenuation medially in the left lobe of liver may  represent fatty infiltration. Consider MRI to assess more  sensitively.  4. Large amount of feces throughout the colon.  5. Compression deformity of L1 vertebral body of approximately 70%  with some retropulsion, new since December of 2014.  8. Depressive disorder, not elsewhere classified Amitriptyline was started mainly for anorexia and insomnia. It may be helpful with depression. The dose that was ordered is quite low. She remains on duloxetine 60 mg daily for depression.  9. Alzheimer's disease Continue donepezil

## 2013-07-27 NOTE — Patient Instructions (Signed)
Start laxatives as indicated.

## 2013-08-01 ENCOUNTER — Encounter: Payer: Self-pay | Admitting: Internal Medicine

## 2013-09-07 ENCOUNTER — Ambulatory Visit: Payer: Medicare HMO | Admitting: Internal Medicine

## 2013-12-09 ENCOUNTER — Telehealth: Payer: Self-pay

## 2013-12-09 NOTE — Telephone Encounter (Signed)
Message left on triage voicemail- please call to discuss medications  Patient's Elavil was 40 dollars and she is on a fixed income. Patient would like to know how to go about getting patient assistance to help with the coverage of medication.

## 2013-12-12 ENCOUNTER — Telehealth: Payer: Self-pay | Admitting: *Deleted

## 2013-12-12 NOTE — Telephone Encounter (Addendum)
Received forms from insurance company [employer]) [id number], completed and faxed back, awaiting approval.

## 2013-12-12 NOTE — Telephone Encounter (Signed)
Attempted Prior Authorization called [id number] initiated process awaiting fax.

## 2013-12-23 ENCOUNTER — Telehealth: Payer: Self-pay | Admitting: *Deleted

## 2013-12-23 ENCOUNTER — Other Ambulatory Visit: Payer: Self-pay

## 2013-12-23 DIAGNOSIS — R634 Abnormal weight loss: Secondary | ICD-10-CM

## 2013-12-23 DIAGNOSIS — G4700 Insomnia, unspecified: Secondary | ICD-10-CM

## 2013-12-23 MED ORDER — AMITRIPTYLINE HCL 25 MG PO TABS: ORAL_TABLET | ORAL | Status: DC

## 2013-12-23 NOTE — Telephone Encounter (Signed)
Pharmacy will not refill the Amitriplyne and patient is seeking patient assistance. Patient has filled out the paperwork and they are waiting for approval but are out of medication. Wants a refill called in, they will pay for it this time while waiting for the paperwork to be completed. Faxed Rx to [facility].

## 2013-12-23 NOTE — Telephone Encounter (Signed)
Printed form online, will fill out and mail to patient for completion

## 2013-12-28 NOTE — Telephone Encounter (Signed)
Patient Assistance Application Form filled out for Amitriptyline 25mg  and signed by Dr. Nyoka Cowden. Mailed to patient to fill out her portion. Copied and sent to scan.

## 2014-02-20 ENCOUNTER — Other Ambulatory Visit: Payer: Self-pay | Admitting: Nurse Practitioner

## 2014-03-13 ENCOUNTER — Ambulatory Visit: Payer: Commercial Managed Care - HMO | Admitting: Nurse Practitioner

## 2014-03-17 ENCOUNTER — Other Ambulatory Visit: Payer: Self-pay | Admitting: Internal Medicine

## 2014-03-17 ENCOUNTER — Other Ambulatory Visit: Payer: Self-pay | Admitting: Nurse Practitioner

## 2014-03-30 ENCOUNTER — Ambulatory Visit (INDEPENDENT_AMBULATORY_CARE_PROVIDER_SITE_OTHER): Payer: Commercial Managed Care - HMO | Admitting: Nurse Practitioner

## 2014-03-30 ENCOUNTER — Encounter: Payer: Self-pay | Admitting: Nurse Practitioner

## 2014-03-30 VITALS — BP 124/78 | HR 85 | Temp 99.8°F | Resp 10 | Ht 66.0 in | Wt 127.0 lb

## 2014-03-30 DIAGNOSIS — R634 Abnormal weight loss: Secondary | ICD-10-CM

## 2014-03-30 DIAGNOSIS — E079 Disorder of thyroid, unspecified: Secondary | ICD-10-CM | POA: Diagnosis not present

## 2014-03-30 DIAGNOSIS — I10 Essential (primary) hypertension: Secondary | ICD-10-CM | POA: Diagnosis not present

## 2014-03-30 DIAGNOSIS — G309 Alzheimer's disease, unspecified: Secondary | ICD-10-CM | POA: Diagnosis not present

## 2014-03-30 DIAGNOSIS — W19XXXA Unspecified fall, initial encounter: Secondary | ICD-10-CM | POA: Diagnosis not present

## 2014-03-30 DIAGNOSIS — E559 Vitamin D deficiency, unspecified: Secondary | ICD-10-CM

## 2014-03-30 DIAGNOSIS — F0280 Dementia in other diseases classified elsewhere without behavioral disturbance: Secondary | ICD-10-CM

## 2014-03-30 DIAGNOSIS — G4700 Insomnia, unspecified: Secondary | ICD-10-CM

## 2014-03-30 MED ORDER — LOSARTAN POTASSIUM 100 MG PO TABS: 100.0000 mg | ORAL_TABLET | Freq: Every day | ORAL | Status: DC

## 2014-03-30 MED ORDER — LEVOTHYROXINE SODIUM 88 MCG PO TABS: ORAL_TABLET | ORAL | Status: DC

## 2014-03-30 MED ORDER — MEMANTINE HCL-DONEPEZIL HCL ER 28-10 MG PO CP24: 1.0000 | ORAL_CAPSULE | Freq: Every day | ORAL | Status: DC

## 2014-03-30 MED ORDER — VITAMIN D (ERGOCALCIFEROL) 1.25 MG (50000 UNIT) PO CAPS: 50000.0000 [IU] | ORAL_CAPSULE | ORAL | Status: DC

## 2014-03-30 MED ORDER — AMITRIPTYLINE HCL 50 MG PO TABS: 50.0000 mg | ORAL_TABLET | Freq: Every day | ORAL | Status: DC

## 2014-03-30 NOTE — Progress Notes (Signed)
Patient ID: [name], female   DOB: Apr 11, 1931, 81 y.o.   MRN: [id number]    No Known Allergies  Chief Complaint  Patient presents with  . Medication Management    Discuss medication refills, yearly check on medications   . Fall    Frequent falls  . Immunizations    Refused all immunization updates   . MMSE    14/30, failed clock drawing     HPI: Patient is a 81 y.o. female who presents today for medical management of her chronic issues.  1. Weight loss - Patient's weight has increased from 108 to 127 pounds since last visit.  Pt and daughter reports she is eating well.   2. Insomnia - Patient reports that she sleeps very well and sleeps at least 8 hours per night.   3. Hypothyroidism - Takes synthroid.  4. Constipation - improved with miralax.   5. Frequent falls - Last time she fell was last week at home.  She has bruising on her left and right forearms. No pain to her arms has chronic back pain  6. Dementia - says that her memory is bad and cannot remember who people are some days.    Daughter has no concerns. Does her finances and medications. Pt lives alone family and friends occasionally visit but no one with pt routinely.  Review of Systems:  Review of Systems  Constitutional: Negative for fever and chills.  Respiratory: Negative for cough, shortness of breath and wheezing.   Cardiovascular: Negative for chest pain, palpitations and leg swelling.  Gastrointestinal: Negative for heartburn, nausea, diarrhea and constipation.  Genitourinary: Negative for dysuria.  Musculoskeletal: Negative for myalgias.  Skin: Negative for itching and rash.  Neurological: Negative for dizziness, tremors, weakness and headaches.     Past Medical History  Diagnosis Date  . GERD (gastroesophageal reflux disease)   . Hypertension   . Personal history of fall   . Edema   . Unspecified hereditary and idiopathic peripheral neuropathy   . Pain in thoracic spine   . Abnormality of  gait   . Urinary frequency   . Other protein-calorie malnutrition   . Gastric ulcer, unspecified as acute or chronic, without mention of hemorrhage, perforation, or obstruction   . Loss of weight   . Closed fracture of seventh cervical vertebra without mention of spinal cord injury   . Alzheimer's disease   . Unspecified vitamin D deficiency   . External hemorrhoids without mention of complication   . Edema   . Unspecified hypothyroidism   . Depressive disorder, not elsewhere classified   . Unspecified constipation   . Calculus of kidney   . Osteoarthrosis, unspecified whether generalized or localized, unspecified site   . Insomnia, unspecified    Past Surgical History  Procedure Laterality Date  . Wrist surgery Right 2013   Social History:   reports that she has never smoked. She does not have any smokeless tobacco history on file. She reports that she does not drink alcohol or use illicit drugs.  Family History  Problem Relation Age of Onset  . Cancer Mother   . CVA Father   . Cancer Brother   . Alcohol abuse Sister     Medications: Patient's Medications  New Prescriptions   MEMANTINE HCL-DONEPEZIL HCL (NAMZARIC) 28-10 MG CP24    Take 1 tablet by mouth at bedtime.  Previous Medications   DULOXETINE (CYMBALTA) 60 MG CAPSULE    TAKE 1 CAPSULE EVERY DAY   POLYETHYLENE  GLYCOL POWDER (GLYCOLAX/MIRALAX) POWDER    17 grams daly in 4-8 oz water to prevent constipation   SENNA (SENOKOT) 8.6 MG TABS TABLET    2 at bed to help prevent constipation  Modified Medications   Modified Medication Previous Medication   AMITRIPTYLINE (ELAVIL) 50 MG TABLET amitriptyline (ELAVIL) 25 MG tablet      Take 1 tablet (50 mg total) by mouth at bedtime. 1 tablet at bed to help sleep and appetite    One or two at bed to help sleep and appetite   LEVOTHYROXINE (SYNTHROID, LEVOTHROID) 88 MCG TABLET levothyroxine (SYNTHROID, LEVOTHROID) 88 MCG tablet      TAKE 1 TABLET DAILY BEFORE BREAKFAST.    TAKE 1  TABLET DAILY BEFORE BREAKFAST.   LOSARTAN (COZAAR) 100 MG TABLET losartan (COZAAR) 100 MG tablet      Take 1 tablet (100 mg total) by mouth daily.    TAKE 1 TABLET EVERY DAY   VITAMIN D, ERGOCALCIFEROL, (DRISDOL) 50000 UNITS CAPS CAPSULE Vitamin D, Ergocalciferol, (DRISDOL) 50000 UNITS CAPS capsule      Take 1 capsule (50,000 Units total) by mouth every 7 (seven) days.    Take 1 capsule (50,000 Units total) by mouth every 7 (seven) days.  Discontinued Medications   DONEPEZIL (ARICEPT) 10 MG TABLET    One daily to help preserve memory   MEMANTINE HCL ER 28 MG CP24    Take 28 mg by mouth daily.   TRAMADOL (ULTRAM) 50 MG TABLET    1-2 tablets every 6-8 hours as needed for pain/max of 6 tablets in 24 hours     Physical Exam:  Filed Vitals:   03/30/14 1541  BP: 124/78  Pulse: 85  Temp: 99.8 F (37.7 C)  TempSrc: Oral  Resp: 10  Height: 5\' 6"  (1.676 m)  Weight: 127 lb (57.607 kg)  SpO2: 98%    Physical Exam  Constitutional: She is well-developed, well-nourished, and in no distress.  Frail   HENT:  Head: Normocephalic and atraumatic.  Mouth/Throat: No oropharyngeal exudate.  Neck: Normal range of motion. Neck supple. No JVD present. No thyromegaly present.  Cardiovascular: Normal rate, regular rhythm and normal heart sounds.   Pulmonary/Chest: Effort normal and breath sounds normal. No respiratory distress. She has no wheezes.  Abdominal: Soft. Bowel sounds are normal. She exhibits distension. She exhibits no mass. There is tenderness. There is no rebound and no guarding.  Musculoskeletal: She exhibits no edema or tenderness.  Lymphadenopathy:    She has no cervical adenopathy.  Neurological: She is alert.  Unsteady; slow gait  Skin: Skin is warm and dry.     Labs reviewed: Basic Metabolic Panel:  Recent Labs  07/13/13 1653  NA 144  K 3.9  CL 102  CO2 28  GLUCOSE 83  BUN 18  CREATININE 1.08*  CALCIUM 9.3  TSH 6.020*   Liver Function Tests:  Recent Labs   07/13/13 1653  AST 30  ALT 32  ALKPHOS 87  BILITOT 0.5  PROT 6.3    Recent Labs  07/13/13 1653  LIPASE 43  AMYLASE 90   No results for input(s): AMMONIA in the last 8760 hours. CBC: No results for input(s): WBC, NEUTROABS, HGB, HCT, MCV, PLT in the last 8760 hours. Lipid Panel: No results for input(s): CHOL, HDL, LDLCALC, TRIG, CHOLHDL, LDLDIRECT in the last 8760 hours. TSH:  Recent Labs  07/13/13 1653  TSH 6.020*   A1C: Invalid input(s): A1C   Assessment/Plan 1. Fall Still lives alone.  She uses a walker at home, but reports that she does not always use it.  Will make referral for home health with home assessment. Encouraged her to keep walker with her at all times.  Advised daughter that the patient will need more help in the home to keep patient safe.  Will check CBC today with other labs. Will get HH ST, PT, and nursing to come out to the home for home assessment, gait and strength training and cognitive training  2. Alzheimer's disease - do no see a previous MMSE, pt reports memory decline. On aricept and namenda. Will change to namzaric 1 tablet daily. Addressed concern with pt familys that she lives alone. Needs more assistance during the day to keep her safe. Daughter does not find this to be an issue but willing to look into someone to help her at times. Reinforced the safety issues  3. Insomnia, unspecified -Improved on current regimen. Will cont current medication Gets 8 hours of rest per night   4. Unspecified essential hypertension Stable on meds, continue same. Will check CMP today.    5. Unspecified hypothyroidism -last TSH stable conts on 88 mcg levothyroxine. Will check TSH today.   6. Loss of weight Has improved.  Family and friends bringing her food  7. Vitamin D deficiency Not taking 50,000 units weekly. Will refill daughter to start giving pt medication. Will check vitamin D level.    Follow up in 6 months sooner if needed

## 2014-03-30 NOTE — Patient Instructions (Signed)
Medications refilled  Recommend getting more help in the home for safety Always bring walker with you when you leave the house, use walker round the house  Will get blood work today

## 2014-03-30 NOTE — Progress Notes (Signed)
Failed Clock drawing

## 2014-03-31 LAB — CBC WITH DIFFERENTIAL/PLATELET
Basophils Absolute: 0 10*3/uL (ref 0.0–0.2)
Basos: 1 %
Eos: 2 %
Eosinophils Absolute: 0.1 10*3/uL (ref 0.0–0.4)
HCT: 36.3 % (ref 34.0–46.6)
Hemoglobin: 12.5 g/dL (ref 11.1–15.9)
Immature Grans (Abs): 0 10*3/uL (ref 0.0–0.1)
Immature Granulocytes: 0 %
Lymphocytes Absolute: 1.6 10*3/uL (ref 0.7–3.1)
Lymphs: 36 %
MCH: 34.9 pg — ABNORMAL HIGH (ref 26.6–33.0)
MCHC: 34.4 g/dL (ref 31.5–35.7)
MCV: 101 fL — ABNORMAL HIGH (ref 79–97)
Monocytes Absolute: 0.4 10*3/uL (ref 0.1–0.9)
Monocytes: 9 %
Neutrophils Absolute: 2.5 10*3/uL (ref 1.4–7.0)
Neutrophils Relative %: 52 %
Platelets: 184 10*3/uL (ref 150–379)
RBC: 3.58 x10E6/uL — ABNORMAL LOW (ref 3.77–5.28)
RDW: 13.4 % (ref 12.3–15.4)
WBC: 4.6 10*3/uL (ref 3.4–10.8)

## 2014-03-31 LAB — COMPREHENSIVE METABOLIC PANEL
ALT: 29 IU/L (ref 0–32)
AST: 32 IU/L (ref 0–40)
Albumin/Globulin Ratio: 1.9 (ref 1.1–2.5)
Albumin: 4.3 g/dL (ref 3.5–4.7)
Alkaline Phosphatase: 94 IU/L (ref 39–117)
BUN/Creatinine Ratio: 25 (ref 11–26)
BUN: 26 mg/dL (ref 8–27)
Bilirubin Total: 0.3 mg/dL (ref 0.0–1.2)
CO2: 27 mmol/L (ref 18–29)
Calcium: 8.6 mg/dL — ABNORMAL LOW (ref 8.7–10.3)
Chloride: 100 mmol/L (ref 97–108)
Creatinine, Ser: 1.06 mg/dL — ABNORMAL HIGH (ref 0.57–1.00)
GFR calc Af Amer: 57 mL/min/{1.73_m2} — ABNORMAL LOW (ref >59)
GFR calc non Af Amer: 49 mL/min/{1.73_m2} — ABNORMAL LOW (ref >59)
Globulin, Total: 2.3 g/dL (ref 1.5–4.5)
Glucose: 89 mg/dL (ref 65–99)
Potassium: 3.9 mmol/L (ref 3.5–5.2)
Sodium: 140 mmol/L (ref 134–144)
Total Protein: 6.6 g/dL (ref 6.0–8.5)

## 2014-03-31 LAB — VITAMIN D 25 HYDROXY (VIT D DEFICIENCY, FRACTURES): Vit D, 25-Hydroxy: 21.5 ng/mL — ABNORMAL LOW (ref 30.0–100.0)

## 2014-03-31 LAB — TSH: TSH: 3.48 u[IU]/mL (ref 0.450–4.500)

## 2014-04-01 DIAGNOSIS — M6281 Muscle weakness (generalized): Secondary | ICD-10-CM | POA: Diagnosis not present

## 2014-04-01 DIAGNOSIS — F329 Major depressive disorder, single episode, unspecified: Secondary | ICD-10-CM | POA: Diagnosis not present

## 2014-04-01 DIAGNOSIS — R269 Unspecified abnormalities of gait and mobility: Secondary | ICD-10-CM | POA: Diagnosis not present

## 2014-04-01 DIAGNOSIS — G309 Alzheimer's disease, unspecified: Secondary | ICD-10-CM | POA: Diagnosis not present

## 2014-04-01 DIAGNOSIS — I10 Essential (primary) hypertension: Secondary | ICD-10-CM | POA: Diagnosis not present

## 2014-04-01 DIAGNOSIS — R634 Abnormal weight loss: Secondary | ICD-10-CM | POA: Diagnosis not present

## 2014-04-01 DIAGNOSIS — I739 Peripheral vascular disease, unspecified: Secondary | ICD-10-CM | POA: Diagnosis not present

## 2014-04-01 DIAGNOSIS — F0280 Dementia in other diseases classified elsewhere without behavioral disturbance: Secondary | ICD-10-CM | POA: Diagnosis not present

## 2014-04-01 DIAGNOSIS — Z9181 History of falling: Secondary | ICD-10-CM | POA: Diagnosis not present

## 2014-04-03 ENCOUNTER — Telehealth: Payer: Self-pay | Admitting: *Deleted

## 2014-04-03 NOTE — Telephone Encounter (Signed)
Received Cover My Meds Prior Authorization for patient's Amitriptyline. Filled out and summited online. Waiting results. [employer] # [phone]  Fax: [phone]

## 2014-04-04 NOTE — Telephone Encounter (Signed)
[employer] # [phone] Approved Amitriptyline 50mg  through 02/17/2015

## 2014-04-05 DIAGNOSIS — G309 Alzheimer's disease, unspecified: Secondary | ICD-10-CM | POA: Diagnosis not present

## 2014-04-05 DIAGNOSIS — F0280 Dementia in other diseases classified elsewhere without behavioral disturbance: Secondary | ICD-10-CM | POA: Diagnosis not present

## 2014-04-05 DIAGNOSIS — R269 Unspecified abnormalities of gait and mobility: Secondary | ICD-10-CM | POA: Diagnosis not present

## 2014-04-05 DIAGNOSIS — I10 Essential (primary) hypertension: Secondary | ICD-10-CM | POA: Diagnosis not present

## 2014-04-05 DIAGNOSIS — M6281 Muscle weakness (generalized): Secondary | ICD-10-CM | POA: Diagnosis not present

## 2014-04-05 DIAGNOSIS — I739 Peripheral vascular disease, unspecified: Secondary | ICD-10-CM | POA: Diagnosis not present

## 2014-04-05 DIAGNOSIS — F329 Major depressive disorder, single episode, unspecified: Secondary | ICD-10-CM | POA: Diagnosis not present

## 2014-04-05 DIAGNOSIS — Z9181 History of falling: Secondary | ICD-10-CM | POA: Diagnosis not present

## 2014-04-05 DIAGNOSIS — R634 Abnormal weight loss: Secondary | ICD-10-CM | POA: Diagnosis not present

## 2014-04-06 DIAGNOSIS — Z9181 History of falling: Secondary | ICD-10-CM | POA: Diagnosis not present

## 2014-04-06 DIAGNOSIS — R634 Abnormal weight loss: Secondary | ICD-10-CM | POA: Diagnosis not present

## 2014-04-06 DIAGNOSIS — F0280 Dementia in other diseases classified elsewhere without behavioral disturbance: Secondary | ICD-10-CM | POA: Diagnosis not present

## 2014-04-06 DIAGNOSIS — R269 Unspecified abnormalities of gait and mobility: Secondary | ICD-10-CM | POA: Diagnosis not present

## 2014-04-06 DIAGNOSIS — M6281 Muscle weakness (generalized): Secondary | ICD-10-CM | POA: Diagnosis not present

## 2014-04-06 DIAGNOSIS — I10 Essential (primary) hypertension: Secondary | ICD-10-CM | POA: Diagnosis not present

## 2014-04-06 DIAGNOSIS — G309 Alzheimer's disease, unspecified: Secondary | ICD-10-CM | POA: Diagnosis not present

## 2014-04-06 DIAGNOSIS — I739 Peripheral vascular disease, unspecified: Secondary | ICD-10-CM | POA: Diagnosis not present

## 2014-04-06 DIAGNOSIS — F329 Major depressive disorder, single episode, unspecified: Secondary | ICD-10-CM | POA: Diagnosis not present

## 2014-04-07 DIAGNOSIS — R634 Abnormal weight loss: Secondary | ICD-10-CM | POA: Diagnosis not present

## 2014-04-07 DIAGNOSIS — F329 Major depressive disorder, single episode, unspecified: Secondary | ICD-10-CM | POA: Diagnosis not present

## 2014-04-07 DIAGNOSIS — Z9181 History of falling: Secondary | ICD-10-CM | POA: Diagnosis not present

## 2014-04-07 DIAGNOSIS — M6281 Muscle weakness (generalized): Secondary | ICD-10-CM | POA: Diagnosis not present

## 2014-04-07 DIAGNOSIS — I10 Essential (primary) hypertension: Secondary | ICD-10-CM | POA: Diagnosis not present

## 2014-04-07 DIAGNOSIS — F0280 Dementia in other diseases classified elsewhere without behavioral disturbance: Secondary | ICD-10-CM | POA: Diagnosis not present

## 2014-04-07 DIAGNOSIS — G309 Alzheimer's disease, unspecified: Secondary | ICD-10-CM | POA: Diagnosis not present

## 2014-04-07 DIAGNOSIS — I739 Peripheral vascular disease, unspecified: Secondary | ICD-10-CM | POA: Diagnosis not present

## 2014-04-07 DIAGNOSIS — R269 Unspecified abnormalities of gait and mobility: Secondary | ICD-10-CM | POA: Diagnosis not present

## 2014-04-11 ENCOUNTER — Telehealth: Payer: Self-pay | Admitting: Internal Medicine

## 2014-04-11 DIAGNOSIS — R269 Unspecified abnormalities of gait and mobility: Secondary | ICD-10-CM | POA: Diagnosis not present

## 2014-04-11 DIAGNOSIS — G309 Alzheimer's disease, unspecified: Secondary | ICD-10-CM | POA: Diagnosis not present

## 2014-04-11 DIAGNOSIS — I739 Peripheral vascular disease, unspecified: Secondary | ICD-10-CM | POA: Diagnosis not present

## 2014-04-11 DIAGNOSIS — R634 Abnormal weight loss: Secondary | ICD-10-CM | POA: Diagnosis not present

## 2014-04-11 DIAGNOSIS — F329 Major depressive disorder, single episode, unspecified: Secondary | ICD-10-CM | POA: Diagnosis not present

## 2014-04-11 DIAGNOSIS — I10 Essential (primary) hypertension: Secondary | ICD-10-CM | POA: Diagnosis not present

## 2014-04-11 DIAGNOSIS — M6281 Muscle weakness (generalized): Secondary | ICD-10-CM | POA: Diagnosis not present

## 2014-04-11 DIAGNOSIS — F0280 Dementia in other diseases classified elsewhere without behavioral disturbance: Secondary | ICD-10-CM | POA: Diagnosis not present

## 2014-04-11 DIAGNOSIS — Z9181 History of falling: Secondary | ICD-10-CM | POA: Diagnosis not present

## 2014-04-11 NOTE — Telephone Encounter (Signed)
Pt forgot her meds this am, the the speech Path.  What should pt do..per Dr. Nyoka Cowden.  Ok to wait until tomorrow morning..Carolin Coy

## 2014-04-12 ENCOUNTER — Telehealth: Payer: Self-pay | Admitting: *Deleted

## 2014-04-12 DIAGNOSIS — M6281 Muscle weakness (generalized): Secondary | ICD-10-CM | POA: Diagnosis not present

## 2014-04-12 DIAGNOSIS — F329 Major depressive disorder, single episode, unspecified: Secondary | ICD-10-CM | POA: Diagnosis not present

## 2014-04-12 DIAGNOSIS — I10 Essential (primary) hypertension: Secondary | ICD-10-CM | POA: Diagnosis not present

## 2014-04-12 DIAGNOSIS — F0280 Dementia in other diseases classified elsewhere without behavioral disturbance: Secondary | ICD-10-CM | POA: Diagnosis not present

## 2014-04-12 DIAGNOSIS — Z9181 History of falling: Secondary | ICD-10-CM | POA: Diagnosis not present

## 2014-04-12 DIAGNOSIS — I739 Peripheral vascular disease, unspecified: Secondary | ICD-10-CM | POA: Diagnosis not present

## 2014-04-12 DIAGNOSIS — R269 Unspecified abnormalities of gait and mobility: Secondary | ICD-10-CM | POA: Diagnosis not present

## 2014-04-12 DIAGNOSIS — R634 Abnormal weight loss: Secondary | ICD-10-CM | POA: Diagnosis not present

## 2014-04-12 DIAGNOSIS — G309 Alzheimer's disease, unspecified: Secondary | ICD-10-CM | POA: Diagnosis not present

## 2014-04-12 NOTE — Telephone Encounter (Signed)
Laurey Arrow( physical therapist) [facility] called to inform Dr. Nyoka Cowden that the patient was very depressed. The stated that she thought that she was ready to go a nursing home to stay. I informed her daughter of the situation, and she stated that her mother has been talking in this matter for a few weeks. I also informed her that we would go ahead and get things going for the nursing home process, she agreed.

## 2014-04-13 DIAGNOSIS — M6281 Muscle weakness (generalized): Secondary | ICD-10-CM | POA: Diagnosis not present

## 2014-04-13 DIAGNOSIS — Z9181 History of falling: Secondary | ICD-10-CM | POA: Diagnosis not present

## 2014-04-13 DIAGNOSIS — I739 Peripheral vascular disease, unspecified: Secondary | ICD-10-CM | POA: Diagnosis not present

## 2014-04-13 DIAGNOSIS — F0280 Dementia in other diseases classified elsewhere without behavioral disturbance: Secondary | ICD-10-CM | POA: Diagnosis not present

## 2014-04-13 DIAGNOSIS — G309 Alzheimer's disease, unspecified: Secondary | ICD-10-CM | POA: Diagnosis not present

## 2014-04-13 DIAGNOSIS — F329 Major depressive disorder, single episode, unspecified: Secondary | ICD-10-CM | POA: Diagnosis not present

## 2014-04-13 DIAGNOSIS — I10 Essential (primary) hypertension: Secondary | ICD-10-CM | POA: Diagnosis not present

## 2014-04-13 DIAGNOSIS — R634 Abnormal weight loss: Secondary | ICD-10-CM | POA: Diagnosis not present

## 2014-04-13 DIAGNOSIS — R269 Unspecified abnormalities of gait and mobility: Secondary | ICD-10-CM | POA: Diagnosis not present

## 2014-04-14 DIAGNOSIS — F0280 Dementia in other diseases classified elsewhere without behavioral disturbance: Secondary | ICD-10-CM | POA: Diagnosis not present

## 2014-04-14 DIAGNOSIS — R269 Unspecified abnormalities of gait and mobility: Secondary | ICD-10-CM | POA: Diagnosis not present

## 2014-04-14 DIAGNOSIS — R634 Abnormal weight loss: Secondary | ICD-10-CM | POA: Diagnosis not present

## 2014-04-14 DIAGNOSIS — G309 Alzheimer's disease, unspecified: Secondary | ICD-10-CM | POA: Diagnosis not present

## 2014-04-14 DIAGNOSIS — I739 Peripheral vascular disease, unspecified: Secondary | ICD-10-CM | POA: Diagnosis not present

## 2014-04-14 DIAGNOSIS — M6281 Muscle weakness (generalized): Secondary | ICD-10-CM | POA: Diagnosis not present

## 2014-04-14 DIAGNOSIS — F329 Major depressive disorder, single episode, unspecified: Secondary | ICD-10-CM | POA: Diagnosis not present

## 2014-04-14 DIAGNOSIS — I10 Essential (primary) hypertension: Secondary | ICD-10-CM | POA: Diagnosis not present

## 2014-04-14 DIAGNOSIS — Z9181 History of falling: Secondary | ICD-10-CM | POA: Diagnosis not present

## 2014-04-25 DIAGNOSIS — I10 Essential (primary) hypertension: Secondary | ICD-10-CM | POA: Diagnosis not present

## 2014-04-25 DIAGNOSIS — F329 Major depressive disorder, single episode, unspecified: Secondary | ICD-10-CM | POA: Diagnosis not present

## 2014-04-25 DIAGNOSIS — G309 Alzheimer's disease, unspecified: Secondary | ICD-10-CM | POA: Diagnosis not present

## 2014-04-25 DIAGNOSIS — R269 Unspecified abnormalities of gait and mobility: Secondary | ICD-10-CM | POA: Diagnosis not present

## 2014-04-25 DIAGNOSIS — I739 Peripheral vascular disease, unspecified: Secondary | ICD-10-CM | POA: Diagnosis not present

## 2014-04-25 DIAGNOSIS — Z9181 History of falling: Secondary | ICD-10-CM | POA: Diagnosis not present

## 2014-04-25 DIAGNOSIS — R634 Abnormal weight loss: Secondary | ICD-10-CM | POA: Diagnosis not present

## 2014-04-25 DIAGNOSIS — M6281 Muscle weakness (generalized): Secondary | ICD-10-CM | POA: Diagnosis not present

## 2014-04-25 DIAGNOSIS — F0280 Dementia in other diseases classified elsewhere without behavioral disturbance: Secondary | ICD-10-CM | POA: Diagnosis not present

## 2014-04-27 DIAGNOSIS — F329 Major depressive disorder, single episode, unspecified: Secondary | ICD-10-CM | POA: Diagnosis not present

## 2014-04-27 DIAGNOSIS — R269 Unspecified abnormalities of gait and mobility: Secondary | ICD-10-CM | POA: Diagnosis not present

## 2014-04-27 DIAGNOSIS — I10 Essential (primary) hypertension: Secondary | ICD-10-CM | POA: Diagnosis not present

## 2014-04-27 DIAGNOSIS — R634 Abnormal weight loss: Secondary | ICD-10-CM | POA: Diagnosis not present

## 2014-04-27 DIAGNOSIS — G309 Alzheimer's disease, unspecified: Secondary | ICD-10-CM | POA: Diagnosis not present

## 2014-04-27 DIAGNOSIS — I739 Peripheral vascular disease, unspecified: Secondary | ICD-10-CM | POA: Diagnosis not present

## 2014-04-27 DIAGNOSIS — Z9181 History of falling: Secondary | ICD-10-CM | POA: Diagnosis not present

## 2014-04-27 DIAGNOSIS — M6281 Muscle weakness (generalized): Secondary | ICD-10-CM | POA: Diagnosis not present

## 2014-04-27 DIAGNOSIS — F0280 Dementia in other diseases classified elsewhere without behavioral disturbance: Secondary | ICD-10-CM | POA: Diagnosis not present

## 2014-04-28 DIAGNOSIS — F329 Major depressive disorder, single episode, unspecified: Secondary | ICD-10-CM | POA: Diagnosis not present

## 2014-04-28 DIAGNOSIS — F0280 Dementia in other diseases classified elsewhere without behavioral disturbance: Secondary | ICD-10-CM | POA: Diagnosis not present

## 2014-04-28 DIAGNOSIS — Z9181 History of falling: Secondary | ICD-10-CM | POA: Diagnosis not present

## 2014-04-28 DIAGNOSIS — I739 Peripheral vascular disease, unspecified: Secondary | ICD-10-CM | POA: Diagnosis not present

## 2014-04-28 DIAGNOSIS — I10 Essential (primary) hypertension: Secondary | ICD-10-CM | POA: Diagnosis not present

## 2014-04-28 DIAGNOSIS — R634 Abnormal weight loss: Secondary | ICD-10-CM | POA: Diagnosis not present

## 2014-04-28 DIAGNOSIS — G309 Alzheimer's disease, unspecified: Secondary | ICD-10-CM | POA: Diagnosis not present

## 2014-04-28 DIAGNOSIS — M6281 Muscle weakness (generalized): Secondary | ICD-10-CM | POA: Diagnosis not present

## 2014-04-28 DIAGNOSIS — R269 Unspecified abnormalities of gait and mobility: Secondary | ICD-10-CM | POA: Diagnosis not present

## 2014-05-01 DIAGNOSIS — F329 Major depressive disorder, single episode, unspecified: Secondary | ICD-10-CM | POA: Diagnosis not present

## 2014-05-01 DIAGNOSIS — M6281 Muscle weakness (generalized): Secondary | ICD-10-CM | POA: Diagnosis not present

## 2014-05-01 DIAGNOSIS — F0280 Dementia in other diseases classified elsewhere without behavioral disturbance: Secondary | ICD-10-CM | POA: Diagnosis not present

## 2014-05-01 DIAGNOSIS — R634 Abnormal weight loss: Secondary | ICD-10-CM | POA: Diagnosis not present

## 2014-05-01 DIAGNOSIS — I739 Peripheral vascular disease, unspecified: Secondary | ICD-10-CM | POA: Diagnosis not present

## 2014-05-01 DIAGNOSIS — G309 Alzheimer's disease, unspecified: Secondary | ICD-10-CM | POA: Diagnosis not present

## 2014-05-01 DIAGNOSIS — I10 Essential (primary) hypertension: Secondary | ICD-10-CM | POA: Diagnosis not present

## 2014-05-01 DIAGNOSIS — R269 Unspecified abnormalities of gait and mobility: Secondary | ICD-10-CM | POA: Diagnosis not present

## 2014-05-01 DIAGNOSIS — Z9181 History of falling: Secondary | ICD-10-CM | POA: Diagnosis not present

## 2014-05-02 DIAGNOSIS — Z9181 History of falling: Secondary | ICD-10-CM | POA: Diagnosis not present

## 2014-05-02 DIAGNOSIS — M6281 Muscle weakness (generalized): Secondary | ICD-10-CM | POA: Diagnosis not present

## 2014-05-02 DIAGNOSIS — R269 Unspecified abnormalities of gait and mobility: Secondary | ICD-10-CM | POA: Diagnosis not present

## 2014-05-02 DIAGNOSIS — F329 Major depressive disorder, single episode, unspecified: Secondary | ICD-10-CM | POA: Diagnosis not present

## 2014-05-02 DIAGNOSIS — I10 Essential (primary) hypertension: Secondary | ICD-10-CM | POA: Diagnosis not present

## 2014-05-02 DIAGNOSIS — G309 Alzheimer's disease, unspecified: Secondary | ICD-10-CM | POA: Diagnosis not present

## 2014-05-02 DIAGNOSIS — F0280 Dementia in other diseases classified elsewhere without behavioral disturbance: Secondary | ICD-10-CM | POA: Diagnosis not present

## 2014-05-02 DIAGNOSIS — I739 Peripheral vascular disease, unspecified: Secondary | ICD-10-CM | POA: Diagnosis not present

## 2014-05-02 DIAGNOSIS — R634 Abnormal weight loss: Secondary | ICD-10-CM | POA: Diagnosis not present

## 2014-05-04 DIAGNOSIS — R269 Unspecified abnormalities of gait and mobility: Secondary | ICD-10-CM | POA: Diagnosis not present

## 2014-05-04 DIAGNOSIS — R634 Abnormal weight loss: Secondary | ICD-10-CM | POA: Diagnosis not present

## 2014-05-04 DIAGNOSIS — G309 Alzheimer's disease, unspecified: Secondary | ICD-10-CM | POA: Diagnosis not present

## 2014-05-04 DIAGNOSIS — Z9181 History of falling: Secondary | ICD-10-CM | POA: Diagnosis not present

## 2014-05-04 DIAGNOSIS — I739 Peripheral vascular disease, unspecified: Secondary | ICD-10-CM | POA: Diagnosis not present

## 2014-05-04 DIAGNOSIS — I10 Essential (primary) hypertension: Secondary | ICD-10-CM | POA: Diagnosis not present

## 2014-05-04 DIAGNOSIS — F329 Major depressive disorder, single episode, unspecified: Secondary | ICD-10-CM | POA: Diagnosis not present

## 2014-05-04 DIAGNOSIS — M6281 Muscle weakness (generalized): Secondary | ICD-10-CM | POA: Diagnosis not present

## 2014-05-04 DIAGNOSIS — F0280 Dementia in other diseases classified elsewhere without behavioral disturbance: Secondary | ICD-10-CM | POA: Diagnosis not present

## 2014-05-05 DIAGNOSIS — R269 Unspecified abnormalities of gait and mobility: Secondary | ICD-10-CM | POA: Diagnosis not present

## 2014-05-05 DIAGNOSIS — R634 Abnormal weight loss: Secondary | ICD-10-CM | POA: Diagnosis not present

## 2014-05-05 DIAGNOSIS — Z9181 History of falling: Secondary | ICD-10-CM | POA: Diagnosis not present

## 2014-05-05 DIAGNOSIS — G309 Alzheimer's disease, unspecified: Secondary | ICD-10-CM | POA: Diagnosis not present

## 2014-05-05 DIAGNOSIS — F0280 Dementia in other diseases classified elsewhere without behavioral disturbance: Secondary | ICD-10-CM | POA: Diagnosis not present

## 2014-05-05 DIAGNOSIS — F329 Major depressive disorder, single episode, unspecified: Secondary | ICD-10-CM | POA: Diagnosis not present

## 2014-05-05 DIAGNOSIS — I10 Essential (primary) hypertension: Secondary | ICD-10-CM | POA: Diagnosis not present

## 2014-05-05 DIAGNOSIS — I739 Peripheral vascular disease, unspecified: Secondary | ICD-10-CM | POA: Diagnosis not present

## 2014-05-05 DIAGNOSIS — M6281 Muscle weakness (generalized): Secondary | ICD-10-CM | POA: Diagnosis not present

## 2014-05-16 DIAGNOSIS — I739 Peripheral vascular disease, unspecified: Secondary | ICD-10-CM | POA: Diagnosis not present

## 2014-05-16 DIAGNOSIS — M6281 Muscle weakness (generalized): Secondary | ICD-10-CM | POA: Diagnosis not present

## 2014-05-16 DIAGNOSIS — I10 Essential (primary) hypertension: Secondary | ICD-10-CM | POA: Diagnosis not present

## 2014-05-16 DIAGNOSIS — R634 Abnormal weight loss: Secondary | ICD-10-CM | POA: Diagnosis not present

## 2014-05-16 DIAGNOSIS — F0280 Dementia in other diseases classified elsewhere without behavioral disturbance: Secondary | ICD-10-CM | POA: Diagnosis not present

## 2014-05-16 DIAGNOSIS — Z9181 History of falling: Secondary | ICD-10-CM | POA: Diagnosis not present

## 2014-05-16 DIAGNOSIS — R269 Unspecified abnormalities of gait and mobility: Secondary | ICD-10-CM | POA: Diagnosis not present

## 2014-05-16 DIAGNOSIS — G309 Alzheimer's disease, unspecified: Secondary | ICD-10-CM | POA: Diagnosis not present

## 2014-05-16 DIAGNOSIS — F329 Major depressive disorder, single episode, unspecified: Secondary | ICD-10-CM | POA: Diagnosis not present

## 2014-05-19 ENCOUNTER — Encounter: Payer: Self-pay | Admitting: Nurse Practitioner

## 2014-05-19 DIAGNOSIS — G309 Alzheimer's disease, unspecified: Secondary | ICD-10-CM | POA: Diagnosis not present

## 2014-05-19 DIAGNOSIS — I10 Essential (primary) hypertension: Secondary | ICD-10-CM | POA: Diagnosis not present

## 2014-05-19 DIAGNOSIS — R634 Abnormal weight loss: Secondary | ICD-10-CM | POA: Diagnosis not present

## 2014-05-19 DIAGNOSIS — R269 Unspecified abnormalities of gait and mobility: Secondary | ICD-10-CM | POA: Diagnosis not present

## 2014-05-19 DIAGNOSIS — I739 Peripheral vascular disease, unspecified: Secondary | ICD-10-CM | POA: Diagnosis not present

## 2014-05-19 DIAGNOSIS — Z9181 History of falling: Secondary | ICD-10-CM | POA: Diagnosis not present

## 2014-05-19 DIAGNOSIS — F0280 Dementia in other diseases classified elsewhere without behavioral disturbance: Secondary | ICD-10-CM | POA: Diagnosis not present

## 2014-05-19 DIAGNOSIS — F329 Major depressive disorder, single episode, unspecified: Secondary | ICD-10-CM | POA: Diagnosis not present

## 2014-05-19 DIAGNOSIS — M6281 Muscle weakness (generalized): Secondary | ICD-10-CM | POA: Diagnosis not present

## 2014-08-14 ENCOUNTER — Telehealth: Payer: Self-pay | Admitting: Internal Medicine

## 2014-08-14 ENCOUNTER — Telehealth: Payer: Self-pay | Admitting: *Deleted

## 2014-08-14 DIAGNOSIS — S62102A Fracture of unspecified carpal bone, left wrist, initial encounter for closed fracture: Secondary | ICD-10-CM

## 2014-08-14 DIAGNOSIS — S52502A Unspecified fracture of the lower end of left radius, initial encounter for closed fracture: Secondary | ICD-10-CM | POA: Diagnosis not present

## 2014-08-14 NOTE — Telephone Encounter (Signed)
Caregiver called and stated that patient just fell and possibly broke Left Wrist. She made an appointment today with the orthopedic-Dr. Noemi Chapel and needs referral for the 4:15 appointment. I spoke with Caren Griffins and given to Inez Catalina to get prior approval through insurance.

## 2014-08-14 NOTE — Addendum Note (Signed)
Addended by: Rafael Bihari A on: 08/14/2014 03:44 PM   Modules accepted: Orders

## 2014-08-14 NOTE — Telephone Encounter (Signed)
[name] daughter called and spoke with Rodena Piety on 08/14/2014. Patient fell and has a possible broken wrist and needed an emergency referral to see Raliegh Ip. A referral was place an authorized for 08/14/2014. Authorization # from [facility] is [id number] for 6 visits.

## 2014-08-16 DIAGNOSIS — S52502D Unspecified fracture of the lower end of left radius, subsequent encounter for closed fracture with routine healing: Secondary | ICD-10-CM | POA: Diagnosis not present

## 2014-08-22 DIAGNOSIS — S52502D Unspecified fracture of the lower end of left radius, subsequent encounter for closed fracture with routine healing: Secondary | ICD-10-CM | POA: Diagnosis not present

## 2014-09-12 DIAGNOSIS — S52502D Unspecified fracture of the lower end of left radius, subsequent encounter for closed fracture with routine healing: Secondary | ICD-10-CM | POA: Diagnosis not present

## 2014-09-21 ENCOUNTER — Other Ambulatory Visit: Payer: Self-pay | Admitting: Nurse Practitioner

## 2014-09-28 ENCOUNTER — Ambulatory Visit: Payer: Commercial Managed Care - HMO | Admitting: Nurse Practitioner

## 2014-10-05 ENCOUNTER — Ambulatory Visit: Payer: Commercial Managed Care - HMO | Admitting: Nurse Practitioner

## 2014-10-12 ENCOUNTER — Encounter: Payer: Self-pay | Admitting: Nurse Practitioner

## 2014-10-12 ENCOUNTER — Ambulatory Visit (INDEPENDENT_AMBULATORY_CARE_PROVIDER_SITE_OTHER): Payer: Commercial Managed Care - HMO | Admitting: Nurse Practitioner

## 2014-10-12 VITALS — HR 88 | Temp 98.4°F | Resp 20 | Ht 66.0 in | Wt 128.4 lb

## 2014-10-12 DIAGNOSIS — S62102D Fracture of unspecified carpal bone, left wrist, subsequent encounter for fracture with routine healing: Secondary | ICD-10-CM | POA: Diagnosis not present

## 2014-10-12 DIAGNOSIS — F03918 Unspecified dementia, unspecified severity, with other behavioral disturbance: Secondary | ICD-10-CM

## 2014-10-12 DIAGNOSIS — W19XXXD Unspecified fall, subsequent encounter: Secondary | ICD-10-CM | POA: Diagnosis not present

## 2014-10-12 DIAGNOSIS — G4700 Insomnia, unspecified: Secondary | ICD-10-CM | POA: Diagnosis not present

## 2014-10-12 DIAGNOSIS — F0391 Unspecified dementia with behavioral disturbance: Secondary | ICD-10-CM

## 2014-10-12 DIAGNOSIS — I10 Essential (primary) hypertension: Secondary | ICD-10-CM

## 2014-10-12 DIAGNOSIS — R634 Abnormal weight loss: Secondary | ICD-10-CM | POA: Diagnosis not present

## 2014-10-12 DIAGNOSIS — K5900 Constipation, unspecified: Secondary | ICD-10-CM

## 2014-10-12 DIAGNOSIS — E039 Hypothyroidism, unspecified: Secondary | ICD-10-CM | POA: Diagnosis not present

## 2014-10-12 DIAGNOSIS — G309 Alzheimer's disease, unspecified: Secondary | ICD-10-CM

## 2014-10-12 DIAGNOSIS — F0280 Dementia in other diseases classified elsewhere without behavioral disturbance: Secondary | ICD-10-CM

## 2014-10-12 DIAGNOSIS — E559 Vitamin D deficiency, unspecified: Secondary | ICD-10-CM | POA: Diagnosis not present

## 2014-10-12 MED ORDER — DULOXETINE HCL 60 MG PO CPEP: 60.0000 mg | ORAL_CAPSULE | Freq: Every day | ORAL | Status: DC

## 2014-10-12 MED ORDER — AMITRIPTYLINE HCL 50 MG PO TABS: 50.0000 mg | ORAL_TABLET | Freq: Every day | ORAL | Status: DC

## 2014-10-12 MED ORDER — VITAMIN D (ERGOCALCIFEROL) 1.25 MG (50000 UNIT) PO CAPS: 50000.0000 [IU] | ORAL_CAPSULE | ORAL | Status: DC

## 2014-10-12 MED ORDER — LEVOTHYROXINE SODIUM 88 MCG PO TABS: ORAL_TABLET | ORAL | Status: DC

## 2014-10-12 MED ORDER — LOSARTAN POTASSIUM 100 MG PO TABS: 100.0000 mg | ORAL_TABLET | Freq: Every day | ORAL | Status: DC

## 2014-10-12 MED ORDER — MEMANTINE HCL-DONEPEZIL HCL ER 28-10 MG PO CP24: 1.0000 | ORAL_CAPSULE | Freq: Every day | ORAL | Status: DC

## 2014-10-12 NOTE — Progress Notes (Signed)
Patient ID: [name], female   DOB: [birth year], 81 y.o.   MRN: [id number]    PCP: Estill Dooms, MD  No Known Allergies  Chief Complaint  Patient presents with  . Medical Management of Chronic Issues    6 month follow-up     HPI: Patient is a 81 y.o. female seen in the office today for routine follow up.  Patient is a 81 y.o. female who presents today for medical management of her chronic issues. Since last visit fell again in June and broke her left wrist.  Falling multiple times. Stays alone. Refuses to go anywhere. Gets mad when her daughter has someone come in every once in a while to help out.  Reports wrist still hurts. Not taking any medication for it due to taking too much one day.  Neighbor is helping with the medication. Brings them to her twice a day. Gets very mad that she is getting help with this.   Weight has been stable in the last 6 months Eating okay right now, if her daughter fixes her food she eats it all.   Dementia - says that her memory is bad, withdrawn, gets angry a lot with family and those who come help.   Advanced Directive information Does patient have an advance directive?: Yes Review of Systems:  Review of Systems  Constitutional: Negative for fever and chills.  Respiratory: Negative for cough, shortness of breath and wheezing.   Cardiovascular: Negative for chest pain, palpitations and leg swelling.  Gastrointestinal: Positive for constipation. Negative for nausea and diarrhea.  Genitourinary: Negative for dysuria.  Musculoskeletal: Negative for myalgias.  Skin: Negative for rash.  Neurological: Negative for dizziness, tremors, weakness and headaches.  Psychiatric/Behavioral: Positive for behavioral problems, confusion and agitation.    Past Medical History  Diagnosis Date  . GERD (gastroesophageal reflux disease)   . Hypertension   . Personal history of fall   . Edema   . Unspecified hereditary and idiopathic peripheral neuropathy    . Pain in thoracic spine   . Abnormality of gait   . Urinary frequency   . Other protein-calorie malnutrition   . Gastric ulcer, unspecified as acute or chronic, without mention of hemorrhage, perforation, or obstruction   . Loss of weight   . Closed fracture of seventh cervical vertebra without mention of spinal cord injury   . Alzheimer's disease   . Unspecified vitamin D deficiency   . External hemorrhoids without mention of complication   . Edema   . Unspecified hypothyroidism   . Depressive disorder, not elsewhere classified   . Unspecified constipation   . Calculus of kidney   . Osteoarthrosis, unspecified whether generalized or localized, unspecified site   . Insomnia, unspecified    Past Surgical History  Procedure Laterality Date  . Wrist surgery Right 2013   Social History:   reports that she has never smoked. She does not have any smokeless tobacco history on file. She reports that she does not drink alcohol or use illicit drugs.  Family History  Problem Relation Age of Onset  . Cancer Mother   . CVA Father   . Cancer Brother   . Alcohol abuse Sister     Medications: Patient's Medications  New Prescriptions   No medications on file  Previous Medications   AMITRIPTYLINE (ELAVIL) 50 MG TABLET    Take 1 tablet (50 mg total) by mouth at bedtime. 1 tablet at bed to help sleep and appetite   DULOXETINE (  CYMBALTA) 60 MG CAPSULE    TAKE 1 CAPSULE EVERY DAY   LEVOTHYROXINE (SYNTHROID, LEVOTHROID) 88 MCG TABLET    TAKE 1 TABLET DAILY BEFORE BREAKFAST.   LOSARTAN (COZAAR) 100 MG TABLET    Take 1 tablet (100 mg total) by mouth daily.   MEMANTINE HCL-DONEPEZIL HCL (NAMZARIC) 28-10 MG CP24    Take 1 tablet by mouth at bedtime.   POLYETHYLENE GLYCOL POWDER (GLYCOLAX/MIRALAX) POWDER    17 grams daly in 4-8 oz water to prevent constipation   SENNA (SENOKOT) 8.6 MG TABS TABLET    2 at bed to help prevent constipation   VITAMIN D, ERGOCALCIFEROL, (DRISDOL) 50000 UNITS CAPS  CAPSULE    Take 1 capsule (50,000 Units total) by mouth every 7 (seven) days.  Modified Medications   No medications on file  Discontinued Medications   No medications on file     Physical Exam:  Filed Vitals:   10/12/14 1526  Pulse: 88  Temp: 98.4 F (36.9 C)  TempSrc: Oral  Resp: 20  Height: 5\' 6"  (1.676 m)  Weight: 128 lb 6.4 oz (58.242 kg)  SpO2: 96%    Physical Exam  Constitutional:  Thin. Frail.  HENT:  Head: Normocephalic and atraumatic.  Right Ear: External ear normal.  Left Ear: External ear normal.  Nose: Nose normal.  Mouth/Throat: Oropharynx is clear and moist.  Eyes: Conjunctivae and EOM are normal. Pupils are equal, round, and reactive to light.  Neck: Normal range of motion. Neck supple.  Cardiovascular: Normal rate, regular rhythm and normal heart sounds.   Pulmonary/Chest: Effort normal and breath sounds normal.  Abdominal: Soft. Bowel sounds are normal. She exhibits no distension and no mass. There is no tenderness.  Musculoskeletal: Normal range of motion.  Neurological: She is alert. No cranial nerve deficit. Coordination abnormal.  Severe memory loss.   Psychiatric:  Poor judgment. Impulsive.    Labs reviewed: Basic Metabolic Panel:  Recent Labs  03/30/14 1655  NA 140  K 3.9  CL 100  CO2 27  GLUCOSE 89  BUN 26  CREATININE 1.06*  CALCIUM 8.6*  TSH 3.480   Liver Function Tests:  Recent Labs  03/30/14 1655  AST 32  ALT 29  ALKPHOS 94  BILITOT 0.3  PROT 6.6   No results for input(s): LIPASE, AMYLASE in the last 8760 hours. No results for input(s): AMMONIA in the last 8760 hours. CBC:  Recent Labs  03/30/14 1655  WBC 4.6  NEUTROABS 2.5  HGB 12.5  HCT 36.3  MCV 101*  PLT 184   Lipid Panel: No results for input(s): CHOL, HDL, LDLCALC, TRIG, CHOLHDL, LDLDIRECT in the last 8760 hours. TSH:  Recent Labs  03/30/14 1655  TSH 3.480   A1C: Lab Results  Component Value Date   HGBA1C 5.3 08/30/2010      Assessment/Plan 1. Essential hypertension -stable at this time will cont cozaar daily - Comprehensive metabolic panel - CBC With Differential  2. Constipation, unspecified constipation type -worse due to not taking medication -may use miralax daily or every other day if she does not wish to take pills.   3. Dementia with behavioral disturbance -advanced dementia, declines assistance and gets very agitated with family and friends for helping. Lives alone -discuss with daughter need to keep pt safe, and doing things for her safety.  -conts on namzaric daily   4. Hypothyroidism, unspecified hypothyroidism type - conts on synthroid 28mcg, taking medication with other pills due to compliance issues - TSH  5.  Fall, subsequent encounter -advanced dementia, lives alone.  -daughter and neighbors check on her -refuses to use walker or assistive device.   6. Left wrist fracture, with routine healing, subsequent encounter -seen by ortho, no surgical intervention. Still with mild pain -may use tylenol as needed  7. Vitamin D deficiency -cont vit d once weekly - follow up Vitamin D, 25-hydroxy   Daughter and pt request 1 year follow up and as needed.  Will follow up blood work and if needed follow up before then.   Carlos American. Harle Battiest  [facility] [phone] 8 am - 5 pm) [phone] (after hours)

## 2014-10-12 NOTE — Patient Instructions (Signed)
May use tylenol 650 mg every 6 hours as needed for pain

## 2014-10-13 LAB — COMPREHENSIVE METABOLIC PANEL
ALT: 21 IU/L (ref 0–32)
AST: 29 IU/L (ref 0–40)
Albumin/Globulin Ratio: 1.9 (ref 1.1–2.5)
Albumin: 4.2 g/dL (ref 3.5–4.7)
Alkaline Phosphatase: 106 IU/L (ref 39–117)
BUN/Creatinine Ratio: 15 (ref 11–26)
BUN: 22 mg/dL (ref 8–27)
Bilirubin Total: 0.4 mg/dL (ref 0.0–1.2)
CO2: 27 mmol/L (ref 18–29)
Calcium: 9 mg/dL (ref 8.7–10.3)
Chloride: 99 mmol/L (ref 97–108)
Creatinine, Ser: 1.44 mg/dL — ABNORMAL HIGH (ref 0.57–1.00)
GFR calc Af Amer: 39 mL/min/{1.73_m2} — ABNORMAL LOW (ref >59)
GFR calc non Af Amer: 34 mL/min/{1.73_m2} — ABNORMAL LOW (ref >59)
Globulin, Total: 2.2 g/dL (ref 1.5–4.5)
Glucose: 102 mg/dL — ABNORMAL HIGH (ref 65–99)
Potassium: 4.6 mmol/L (ref 3.5–5.2)
Sodium: 141 mmol/L (ref 134–144)
Total Protein: 6.4 g/dL (ref 6.0–8.5)

## 2014-10-13 LAB — TSH: TSH: 2.03 u[IU]/mL (ref 0.450–4.500)

## 2014-10-13 LAB — VITAMIN D 25 HYDROXY (VIT D DEFICIENCY, FRACTURES): Vit D, 25-Hydroxy: 41.9 ng/mL (ref 30.0–100.0)

## 2014-12-19 ENCOUNTER — Emergency Department (HOSPITAL_COMMUNITY): Payer: Commercial Managed Care - HMO

## 2014-12-19 ENCOUNTER — Emergency Department (HOSPITAL_COMMUNITY)
Admission: EM | Admit: 2014-12-19 | Discharge: 2014-12-19 | Disposition: A | Discharge status: Home / Self Care | Payer: Commercial Managed Care - HMO | Attending: Emergency Medicine | Admitting: Emergency Medicine

## 2014-12-19 DIAGNOSIS — G609 Hereditary and idiopathic neuropathy, unspecified: Secondary | ICD-10-CM | POA: Diagnosis not present

## 2014-12-19 DIAGNOSIS — G309 Alzheimer's disease, unspecified: Secondary | ICD-10-CM | POA: Diagnosis not present

## 2014-12-19 DIAGNOSIS — Z8719 Personal history of other diseases of the digestive system: Secondary | ICD-10-CM | POA: Insufficient documentation

## 2014-12-19 DIAGNOSIS — Y9389 Activity, other specified: Secondary | ICD-10-CM | POA: Diagnosis not present

## 2014-12-19 DIAGNOSIS — F329 Major depressive disorder, single episode, unspecified: Secondary | ICD-10-CM | POA: Diagnosis not present

## 2014-12-19 DIAGNOSIS — K5900 Constipation, unspecified: Secondary | ICD-10-CM | POA: Diagnosis not present

## 2014-12-19 DIAGNOSIS — F0280 Dementia in other diseases classified elsewhere without behavioral disturbance: Secondary | ICD-10-CM | POA: Insufficient documentation

## 2014-12-19 DIAGNOSIS — G4700 Insomnia, unspecified: Secondary | ICD-10-CM | POA: Insufficient documentation

## 2014-12-19 DIAGNOSIS — Y998 Other external cause status: Secondary | ICD-10-CM | POA: Diagnosis not present

## 2014-12-19 DIAGNOSIS — I10 Essential (primary) hypertension: Secondary | ICD-10-CM | POA: Diagnosis not present

## 2014-12-19 DIAGNOSIS — R220 Localized swelling, mass and lump, head: Secondary | ICD-10-CM | POA: Diagnosis not present

## 2014-12-19 DIAGNOSIS — W19XXXA Unspecified fall, initial encounter: Secondary | ICD-10-CM

## 2014-12-19 DIAGNOSIS — Z79899 Other long term (current) drug therapy: Secondary | ICD-10-CM | POA: Diagnosis not present

## 2014-12-19 DIAGNOSIS — S0101XA Laceration without foreign body of scalp, initial encounter: Secondary | ICD-10-CM | POA: Insufficient documentation

## 2014-12-19 DIAGNOSIS — S0990XA Unspecified injury of head, initial encounter: Secondary | ICD-10-CM | POA: Diagnosis present

## 2014-12-19 DIAGNOSIS — E559 Vitamin D deficiency, unspecified: Secondary | ICD-10-CM | POA: Insufficient documentation

## 2014-12-19 DIAGNOSIS — Z9181 History of falling: Secondary | ICD-10-CM | POA: Diagnosis not present

## 2014-12-19 DIAGNOSIS — Z8781 Personal history of (healed) traumatic fracture: Secondary | ICD-10-CM | POA: Insufficient documentation

## 2014-12-19 DIAGNOSIS — W01198A Fall on same level from slipping, tripping and stumbling with subsequent striking against other object, initial encounter: Secondary | ICD-10-CM | POA: Diagnosis not present

## 2014-12-19 DIAGNOSIS — E039 Hypothyroidism, unspecified: Secondary | ICD-10-CM | POA: Diagnosis not present

## 2014-12-19 DIAGNOSIS — S0003XA Contusion of scalp, initial encounter: Secondary | ICD-10-CM | POA: Diagnosis not present

## 2014-12-19 DIAGNOSIS — S199XXA Unspecified injury of neck, initial encounter: Secondary | ICD-10-CM | POA: Diagnosis not present

## 2014-12-19 DIAGNOSIS — Y9289 Other specified places as the place of occurrence of the external cause: Secondary | ICD-10-CM | POA: Diagnosis not present

## 2014-12-19 LAB — CBC WITH DIFFERENTIAL/PLATELET
Basophils Absolute: 0 10*3/uL (ref 0.0–0.1)
Basophils Relative: 0 %
Eosinophils Absolute: 0.1 10*3/uL (ref 0.0–0.7)
Eosinophils Relative: 2 %
HCT: 37.8 % (ref 36.0–46.0)
Hemoglobin: 13 g/dL (ref 12.0–15.0)
Lymphocytes Relative: 20 %
Lymphs Abs: 1.3 10*3/uL (ref 0.7–4.0)
MCH: 35 pg — ABNORMAL HIGH (ref 26.0–34.0)
MCHC: 34.4 g/dL (ref 30.0–36.0)
MCV: 101.9 fL — ABNORMAL HIGH (ref 78.0–100.0)
Monocytes Absolute: 0.5 10*3/uL (ref 0.1–1.0)
Monocytes Relative: 7 %
Neutro Abs: 4.7 10*3/uL (ref 1.7–7.7)
Neutrophils Relative %: 71 %
Platelets: 171 10*3/uL (ref 150–400)
RBC: 3.71 MIL/uL — ABNORMAL LOW (ref 3.87–5.11)
RDW: 12.6 % (ref 11.5–15.5)
WBC: 6.6 10*3/uL (ref 4.0–10.5)

## 2014-12-19 LAB — URINALYSIS, ROUTINE W REFLEX MICROSCOPIC
Bilirubin Urine: NEGATIVE
Glucose, UA: NEGATIVE mg/dL
Hgb urine dipstick: NEGATIVE
Ketones, ur: NEGATIVE mg/dL
Leukocytes, UA: NEGATIVE
Nitrite: NEGATIVE
Protein, ur: NEGATIVE mg/dL
Specific Gravity, Urine: 1.013 (ref 1.005–1.030)
Urobilinogen, UA: 0.2 mg/dL (ref 0.0–1.0)
pH: 7.5 (ref 5.0–8.0)

## 2014-12-19 LAB — COMPREHENSIVE METABOLIC PANEL
ALT: 21 U/L (ref 14–54)
AST: 28 U/L (ref 15–41)
Albumin: 4.4 g/dL (ref 3.5–5.0)
Alkaline Phosphatase: 93 U/L (ref 38–126)
Anion gap: 8 (ref 5–15)
BUN: 18 mg/dL (ref 6–20)
CO2: 27 mmol/L (ref 22–32)
Calcium: 9.3 mg/dL (ref 8.9–10.3)
Chloride: 103 mmol/L (ref 101–111)
Creatinine, Ser: 0.87 mg/dL (ref 0.44–1.00)
GFR calc Af Amer: >60 mL/min (ref >60)
GFR calc non Af Amer: >60 mL/min (ref >60)
Glucose, Bld: 117 mg/dL — ABNORMAL HIGH (ref 65–99)
Potassium: 3.9 mmol/L (ref 3.5–5.1)
Sodium: 138 mmol/L (ref 135–145)
Total Bilirubin: 0.8 mg/dL (ref 0.3–1.2)
Total Protein: 7.4 g/dL (ref 6.5–8.1)

## 2014-12-19 MED ORDER — BACITRACIN ZINC 500 UNIT/GM EX OINT
TOPICAL_OINTMENT | CUTANEOUS | Status: AC
  Filled 2014-12-19: qty 0.9

## 2014-12-19 MED ORDER — SODIUM CHLORIDE 0.9 % IV BOLUS (SEPSIS)
500.0000 mL | Freq: Once | INTRAVENOUS | Status: AC
  Administered 2014-12-19: 500 mL via INTRAVENOUS

## 2014-12-19 MED ORDER — SODIUM CHLORIDE 0.9 % IV BOLUS (SEPSIS): 1000.0000 mL | Freq: Once | INTRAVENOUS | Status: DC

## 2014-12-19 MED ORDER — FENTANYL CITRATE (PF) 100 MCG/2ML IJ SOLN
12.5000 ug | Freq: Once | INTRAMUSCULAR | Status: AC
  Administered 2014-12-19: 12.5 ug via INTRAVENOUS
  Filled 2014-12-19: qty 2

## 2014-12-19 MED ORDER — LABETALOL HCL 5 MG/ML IV SOLN
10.0000 mg | INTRAVENOUS | Status: DC | PRN
  Administered 2014-12-19 (×2): 10 mg via INTRAVENOUS
  Filled 2014-12-19 (×2): qty 4

## 2014-12-19 NOTE — ED Provider Notes (Signed)
CSN: [id number]     Arrival date & time 12/19/14  1821 History   First MD Initiated Contact with Patient 12/19/14 1831     Chief Complaint  Patient presents with  . Loss of Consciousness  . Head Laceration     (Consider location/radiation/quality/duration/timing/severity/associated sxs/prior Treatment) HPI patient presents after a fall. Patient's history of dementia, is here with her daughter who provide history of present illness. Level V caveat secondary to both dementia and the patient's altered mental status. Daughter states that the patient was in her usual state of health until about one hour ago. The daughter heard the patient fall against a brick wall. Since that time the patient has had decreased interactivity, is not answering questions, is minimally moving her extremity. This is a notable change from baseline. Daughter states that there have been no recent health changes, diet changes, medication changes.  Past Medical History  Diagnosis Date  . GERD (gastroesophageal reflux disease)   . Hypertension   . Personal history of fall   . Edema   . Unspecified hereditary and idiopathic peripheral neuropathy   . Pain in thoracic spine   . Abnormality of gait   . Urinary frequency   . Other protein-calorie malnutrition   . Gastric ulcer, unspecified as acute or chronic, without mention of hemorrhage, perforation, or obstruction   . Loss of weight   . Closed fracture of seventh cervical vertebra without mention of spinal cord injury   . Alzheimer's disease   . Unspecified vitamin D deficiency   . External hemorrhoids without mention of complication   . Edema   . Unspecified hypothyroidism   . Depressive disorder, not elsewhere classified   . Unspecified constipation   . Calculus of kidney   . Osteoarthrosis, unspecified whether generalized or localized, unspecified site   . Insomnia, unspecified    Past Surgical History  Procedure Laterality Date  . Wrist surgery  Right 2013   Family History  Problem Relation Age of Onset  . Cancer Mother   . CVA Father   . Cancer Brother   . Alcohol abuse Sister    Social History  Substance Use Topics  . Smoking status: Never Smoker   . Smokeless tobacco: Not on file  . Alcohol Use: No   OB History    No data available     Review of Systems  Unable to perform ROS: Dementia      Allergies  Review of patient's allergies indicates no known allergies.  Home Medications   Prior to Admission medications   Medication Sig Start Date End Date Taking? Authorizing Provider  amitriptyline (ELAVIL) 50 MG tablet Take 1 tablet (50 mg total) by mouth at bedtime. 1 tablet at bed to help sleep and appetite 10/12/14  Yes [name], NP  DULoxetine (CYMBALTA) 60 MG capsule Take 1 capsule (60 mg total) by mouth daily. 10/12/14  Yes [name], NP  levothyroxine (SYNTHROID, LEVOTHROID) 88 MCG tablet TAKE 1 TABLET DAILY BEFORE BREAKFAST. 10/12/14  Yes [name], NP  losartan (COZAAR) 100 MG tablet Take 1 tablet (100 mg total) by mouth daily. 10/12/14  Yes [name], NP  Memantine HCl-Donepezil HCl (NAMZARIC) 28-10 MG CP24 Take 1 tablet by mouth at bedtime. 10/12/14  Yes [name], NP  polyethylene glycol powder (GLYCOLAX/MIRALAX) powder 17 grams daly in 4-8 oz water to prevent constipation Patient taking differently: Take 17 g by mouth daily as needed for mild constipation. 17 grams daly in  4-8 oz water to prevent constipation 07/27/13  Yes Estill Dooms, MD  senna (SENOKOT) 8.6 MG TABS tablet 2 at bed to help prevent constipation Patient taking differently: Take 2 tablets by mouth at bedtime as needed for mild constipation. 2 at bed to help prevent constipation 07/27/13  Yes Estill Dooms, MD  Vitamin D, Ergocalciferol, (DRISDOL) 50000 UNITS CAPS capsule Take 1 capsule (50,000 Units total) by mouth every 7 (seven) days. Patient taking differently: Take 50,000 Units by mouth every 30  (thirty) days.  10/12/14  Yes [name], NP   BP 203/97 mmHg  Pulse 69  Temp(Src) 98.5 F (36.9 C) (Oral)  Resp 12  SpO2 100% Physical Exam  Constitutional: She appears ill.  HENT:  Head: Normocephalic and atraumatic.    Eyes: Conjunctivae and EOM are normal.  Neck: No tracheal deviation present.  Patient cannot participate in exam, but no obvious deformity  Cardiovascular: Normal rate and regular rhythm.   Pulmonary/Chest: Effort normal and breath sounds normal. No stridor. No respiratory distress.  Abdominal: She exhibits no distension.  Musculoskeletal: She exhibits no edema.  Neurological: No cranial nerve deficit.  Patient response to pain, does not answer questions appropriately, does not track spontaneously, visually, moves all extremities minimally.  Skin: Skin is warm and dry.  Psychiatric: Cognition and memory are impaired.  Nursing note and vitals reviewed.   ED Course  Procedures (including critical care time) Labs Review Labs Reviewed  COMPREHENSIVE METABOLIC PANEL - Abnormal; Notable for the following:    Glucose, Bld 117 (*)    All other components within normal limits  CBC WITH DIFFERENTIAL/PLATELET - Abnormal; Notable for the following:    RBC 3.71 (*)    MCV 101.9 (*)    MCH 35.0 (*)    All other components within normal limits  URINALYSIS, ROUTINE W REFLEX MICROSCOPIC (NOT AT [facility])  I-STAT CG4 LACTIC ACID, ED    Imaging Review  I reviewed the CT imaging, while the patient was still having her studies performed. Notable for intracranial hemorrhage.  I have personally reviewed and evaluated these images and lab results as part of my medical decision-making.   EKG Interpretation   Date/Time:  Tuesday December 19 2014 18:56:20 EDT Ventricular Rate:  77 PR Interval:  185 QRS Duration: 102 QT Interval:  489 QTC Calculation: 553 R Axis:   64 Text Interpretation:  Sinus rhythm Probable left atrial enlargement  Nonspecific T  abnormalities, lateral leads Prolonged QT interval Sinus  rhythm T wave abnormality Artifact QT prolonged Abnormal ekg Confirmed by  Carmin Muskrat  MD ([id number]) on 12/19/2014 7:22:06 PM     Pulse ox 99% room air normal  9:02 PM Patient awake and alert  LACERATION REPAIR Performed by: Carmin Muskrat Authorized by: Carmin Muskrat Consent: Verbal consent obtained. Risks and benefits: risks, benefits and alternatives were discussed Consent given by: patient Patient identity confirmed: provided demographic data Prepped and Draped in normal sterile fashion Wound explored  Laceration Location: scalp - L occipital  Laceration Length: 3.5cm  No Foreign Bodies seen or palpated    Irrigation method: syringe Amount of cleaning: standard  Skin closure: staples  Number of staples - 2  Technique: close  Patient tolerance: Patient tolerated the procedure well with no immediate complications.  On repeat exam the patient is now awake, answered questions briefly, but appropriately.  MDM  Patient presents after a fall. With history of dementia, and noted change in mental status by family members, broad differential considered,  including intracranial hemorrhage, and with initial physical exam findings, facial fracture, cervical spine fracture or considered as well. Here the patient's evaluation was actually largely reassuring, with no evidence for acute infection. No evidence for fracture, hemorrhage. Patient did have laceration, which required staple repair. With recovery to baseline interactivity, there suspicion for patient's baseline gait instability as causative. Patient discharged in stable condition with family.  Carmin Muskrat, MD 12/19/14 2104

## 2014-12-19 NOTE — ED Notes (Signed)
LACs cleaned with sterile saline and ice pack applied to head.

## 2014-12-19 NOTE — Discharge Instructions (Signed)
Please be sure to follow-up with your primary care physician.  Please be sure to have staples removed in one week.  Return here for concerning changes in your condition.

## 2014-12-19 NOTE — ED Notes (Signed)
Daughter reports pt hx of dementia, pt fell against a brick wall and LOC. Pt has small laceration ot left side of head with hematoma. Laceration to left side of face, near mouth. Pt not answering questions, barely opens eyes even when asked.

## 2014-12-19 NOTE — ED Notes (Signed)
Patient transported to CT 

## 2014-12-28 ENCOUNTER — Encounter: Payer: Self-pay | Admitting: Nurse Practitioner

## 2014-12-28 ENCOUNTER — Ambulatory Visit (INDEPENDENT_AMBULATORY_CARE_PROVIDER_SITE_OTHER): Payer: Commercial Managed Care - HMO | Admitting: Nurse Practitioner

## 2014-12-28 VITALS — BP 162/100 | HR 96 | Temp 98.4°F | Resp 18 | Ht 66.0 in | Wt 130.2 lb

## 2014-12-28 DIAGNOSIS — R634 Abnormal weight loss: Secondary | ICD-10-CM

## 2014-12-28 DIAGNOSIS — F0391 Unspecified dementia with behavioral disturbance: Secondary | ICD-10-CM | POA: Diagnosis not present

## 2014-12-28 DIAGNOSIS — IMO0002 Reserved for concepts with insufficient information to code with codable children: Secondary | ICD-10-CM

## 2014-12-28 DIAGNOSIS — W19XXXD Unspecified fall, subsequent encounter: Secondary | ICD-10-CM | POA: Diagnosis not present

## 2014-12-28 DIAGNOSIS — Z8781 Personal history of (healed) traumatic fracture: Secondary | ICD-10-CM

## 2014-12-28 DIAGNOSIS — Z780 Asymptomatic menopausal state: Secondary | ICD-10-CM

## 2014-12-28 DIAGNOSIS — E559 Vitamin D deficiency, unspecified: Secondary | ICD-10-CM | POA: Diagnosis not present

## 2014-12-28 DIAGNOSIS — T148 Other injury of unspecified body region: Secondary | ICD-10-CM

## 2014-12-28 DIAGNOSIS — F03918 Unspecified dementia, unspecified severity, with other behavioral disturbance: Secondary | ICD-10-CM

## 2014-12-28 DIAGNOSIS — I10 Essential (primary) hypertension: Secondary | ICD-10-CM | POA: Diagnosis not present

## 2014-12-28 IMAGING — CT CT ABD-PELV W/ CM
3 of 5 series · 12 of 36 positions shown, 18 images · IV contrast (READICAT/WATER & [ID] OMNI 300)
Comparison: CT abdomen pelvis of 08/30/2010

CLINICAL DATA: Epigastric pain for several months, nausea, weight
loss

EXAM:
CT ABDOMEN AND PELVIS WITH CONTRAST
TECHNIQUE: Multidetector CT imaging of the abdomen and pelvis was performed
using the standard protocol following bolus administration of
intravenous contrast.
CONTRAST:  100mL OMNIPAQUE IOHEXOL 300 MG/ML  SOLN

[Series 3: abd/pelvis with · axial · 0.64mm/px · z∈[-319,-34]mm · 7 of 77 slices shown, 12 images]
[im 10/77  soft-tissue]
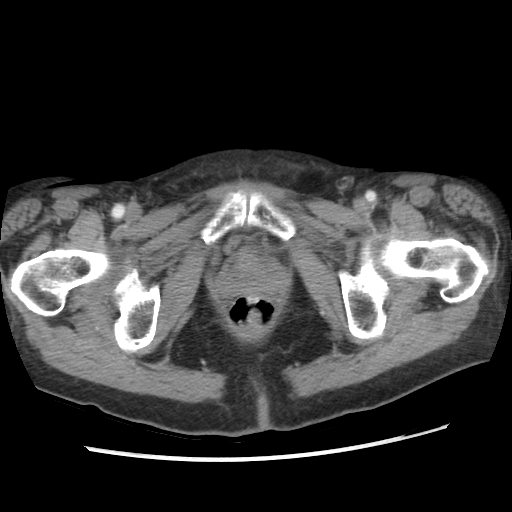
[im 10/77  bone]
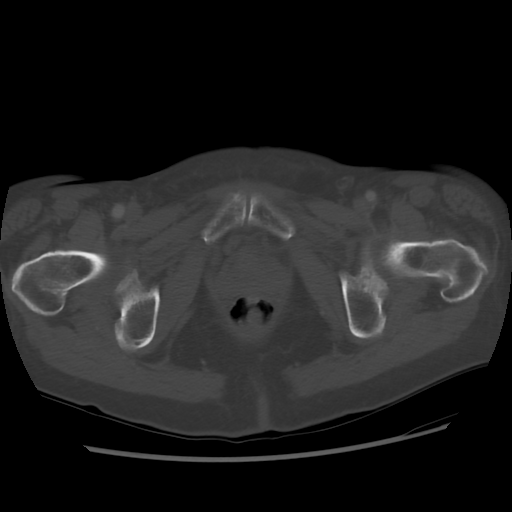
[im 20/77  soft-tissue]
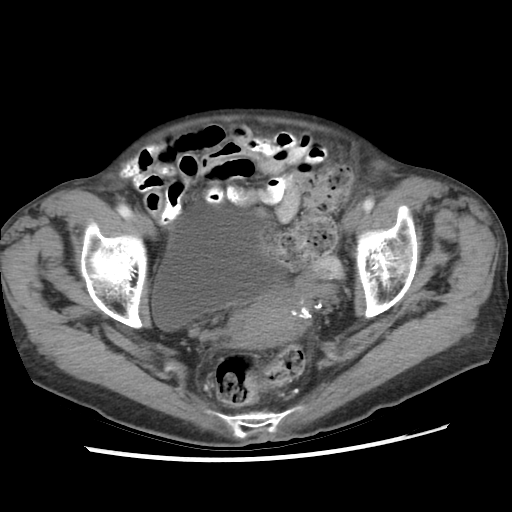
[im 29/77  soft-tissue]
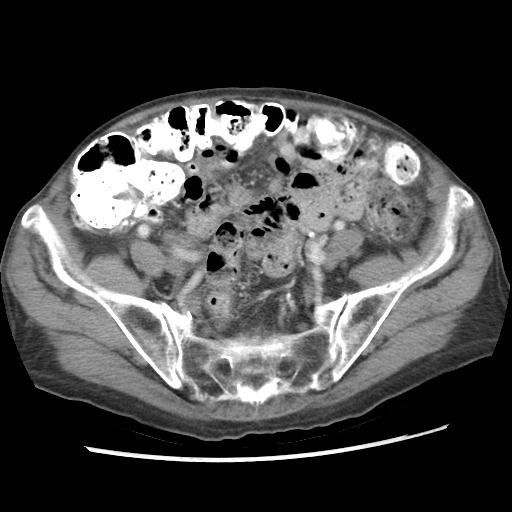
[im 39/77  soft-tissue]
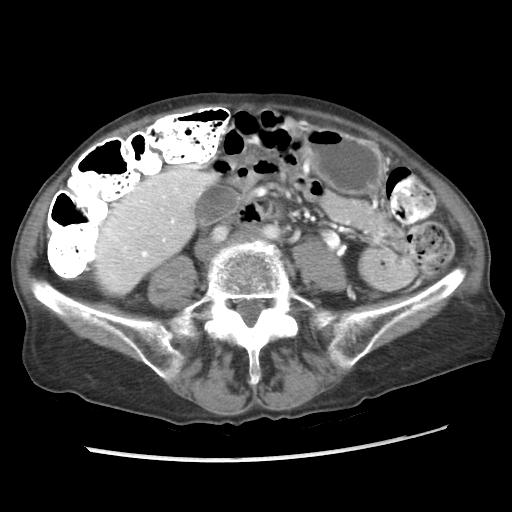
[im 39/77  lung]
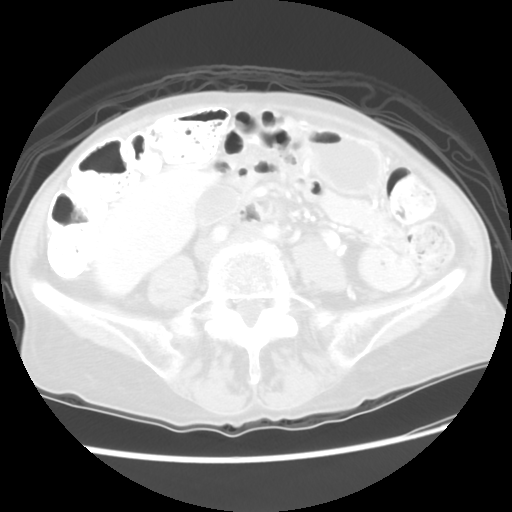
[im 48/77  soft-tissue]
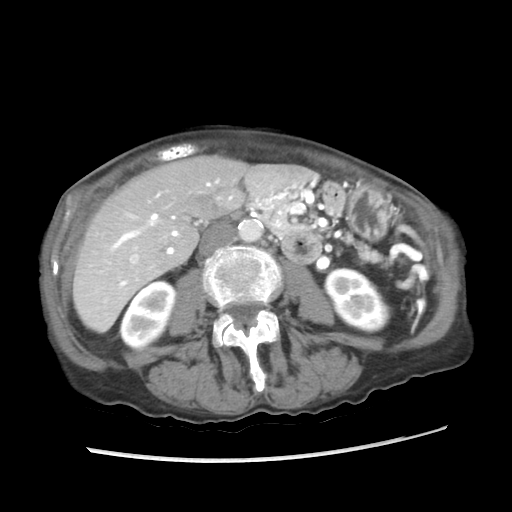
[im 48/77  lung]
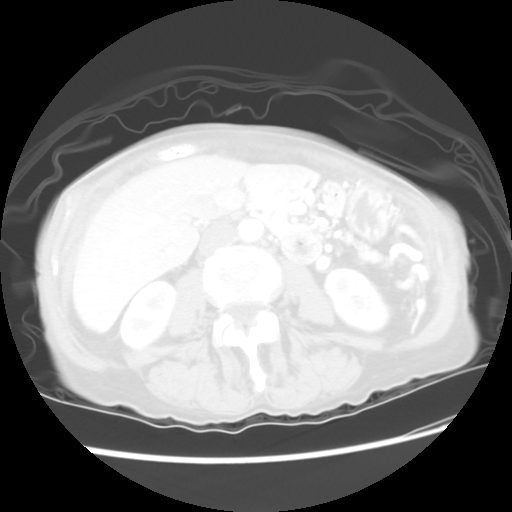
[im 58/77  soft-tissue]
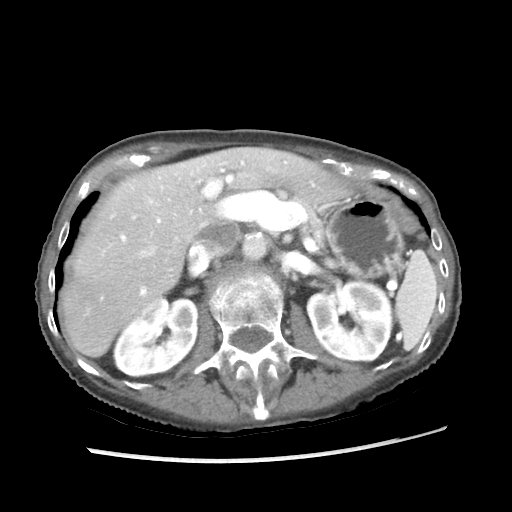
[im 58/77  lung]
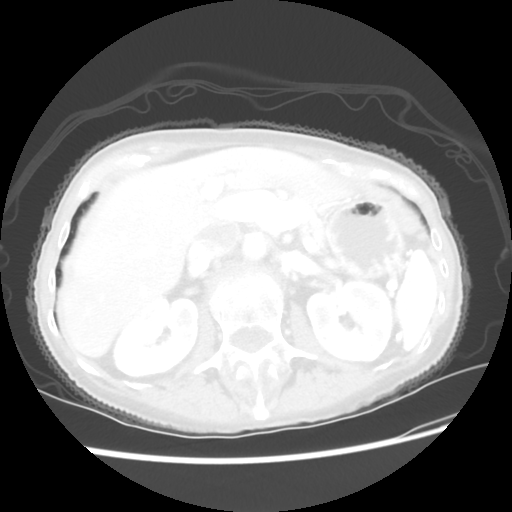
[im 67/77  soft-tissue]
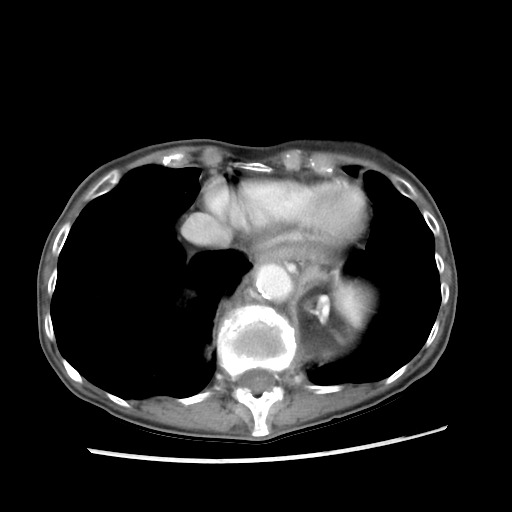
[im 67/77  lung]
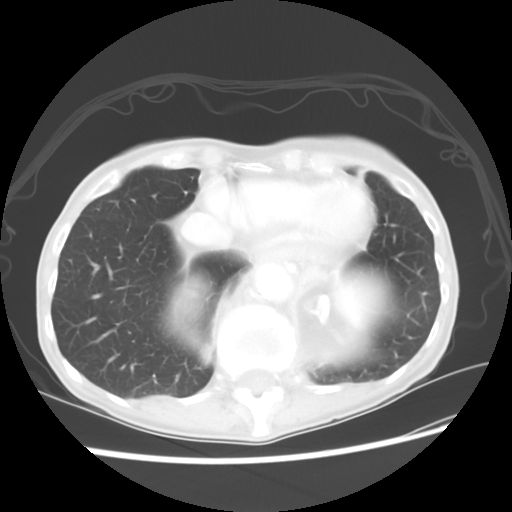

[Series 601: coronal body · coronal · 0.77mm/px · 1 of 96 slices shown, 2 images]
[im 32/96  soft-tissue]
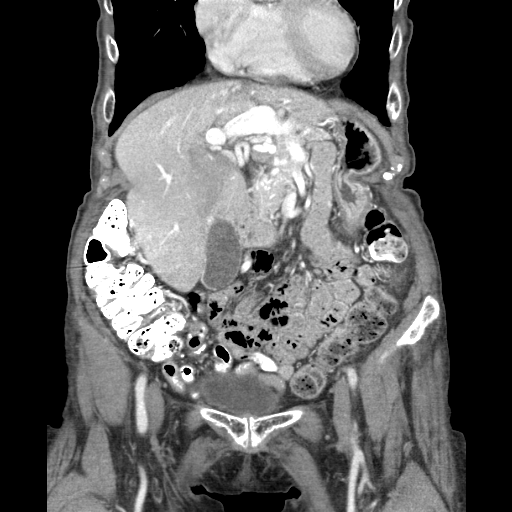
[im 32/96  bone]
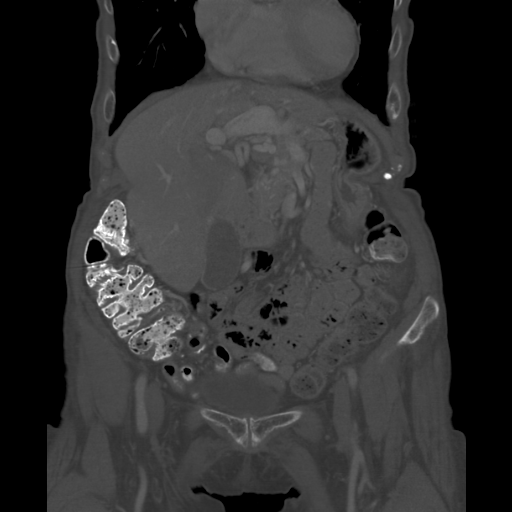

[Series 602: sagittal body · sagittal · 0.77mm/px · 4 of 133 slices shown]
[im 9/133  soft-tissue]
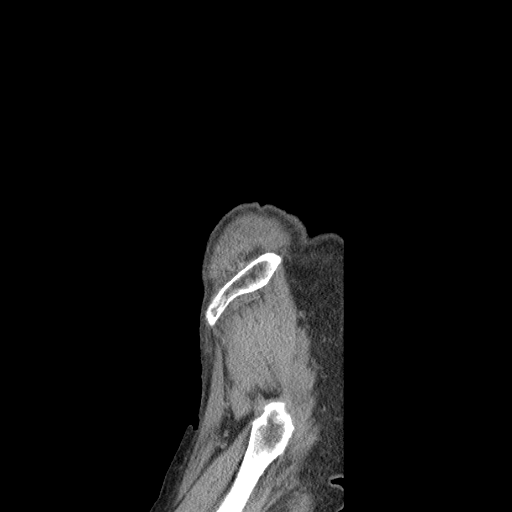
[im 27/133  soft-tissue]
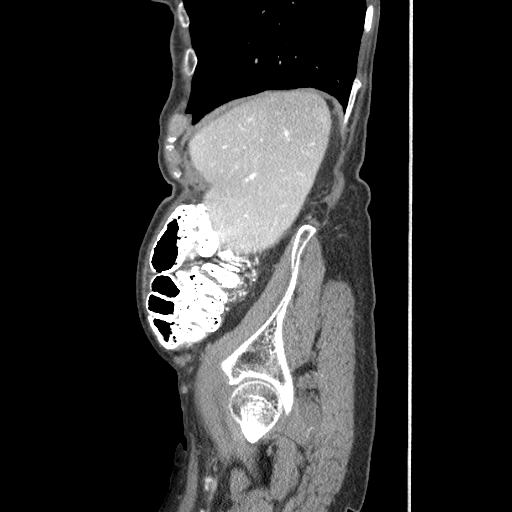
[im 45/133  soft-tissue]
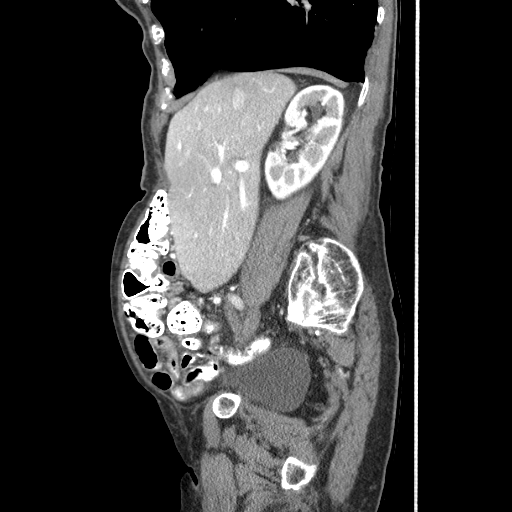
[im 62/133  soft-tissue]
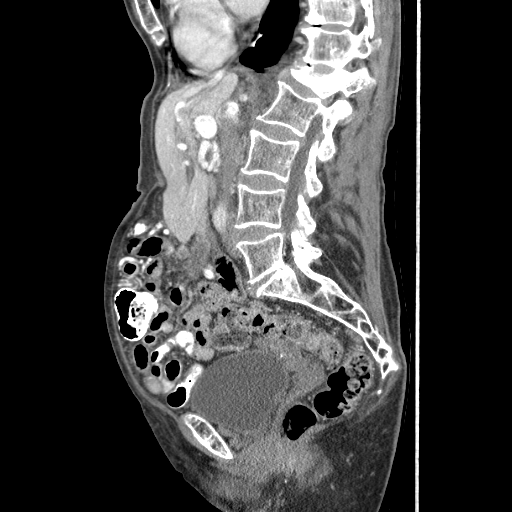

[12 of 36 positions shown; findings below may reference images not displayed]

FINDINGS: The lung bases are clear. Mild cardiomegaly is stable. Varices are
noted within the left upper quadrant, unchanged compared to the
prior CT from 7627. The liver enhances and there is a single cyst
noted medially in the left lobe. Adjacent to this cyst there is a
elliptical area of low attenuation. Vessels do appear to course
through this region and this may simply represent focal fatty
infiltration but MRI may be helpful to assess further. No
intrahepatic ductal dilatation is seen. The liver does extend into
the right pelvis. No calcified gallstones are seen. The pancreas is
relatively atrophic and the pancreatic duct is not dilated. The
adrenal glands and spleen are unremarkable. The kidneys enhance with
no calculus or mass and no hydronephrosis is seen. On delayed images
the pelvocaliceal systems are unremarkable and the proximal ureters
are normal in caliber. The abdominal aorta is normal in caliber. No
adenopathy is seen.

The urinary bladder is moderately urine distended with no
abnormality noted. The uterus is normal in size. No adnexal lesion
is seen. No fluid is noted within the pelvis. There is large amount
of feces throughout the entire colon. The terminal ileum is
unremarkable. No abdominal or pelvic mass is seen. Compared to
lumbar spine films from 02/16/2013, the has been a new compression
deformity of L1 vertebral body of approximately 70% with some
retropulsion.
IMPRESSION: 1. No abdominal or pelvic mass or adenopathy is seen.
2. Varices within the left upper quadrant.
3. Area of low attenuation medially in the left lobe of liver may
represent fatty infiltration. Consider MRI to assess more
sensitively.
4. Large amount of feces throughout the colon.
5. Compression deformity of L1 vertebral body of approximately 70%
with some retropulsion, new since Thursday January, 2013.

## 2014-12-28 MED ORDER — VITAMIN D (ERGOCALCIFEROL) 1.25 MG (50000 UNIT) PO CAPS: 50000.0000 [IU] | ORAL_CAPSULE | ORAL | Status: DC

## 2014-12-28 NOTE — Patient Instructions (Signed)
STOP elavil at this time  Change Vit D to monthly   Ordering Dexa Scan to evaluate bone density due to fractures

## 2014-12-28 NOTE — Progress Notes (Signed)
Patient ID: [name], female   DOB: 1931/09/19, 81 y.o.   MRN: [id number]    PCP: Estill Dooms, MD  Advanced Directive information    No Known Allergies  Chief Complaint  Patient presents with  . Medical Management of Chronic Issues    staple rmoved from head injury     HPI: Patient is a 81 y.o. female seen in the office today to follow up fall and have stable removed. Pt had a fall against a wall and went to ED due to change in mental status. No evidence for fracture, hemorrhage. Patient did have laceration, which required staple repair, needing these removed today. No increase in pain in her head. No blurred vision, dizziness, changes in LOC.   Using elavil for sleep and appetite but friend does not feel like this is benefiting her. Not taking medication routinely at night.   conts to live alone. Daughter and friend checking on her. Friend feels like having increased care around the home would be beneficial.    Review of Systems:  Review of Systems  Unable to perform ROS: Dementia    Past Medical History  Diagnosis Date  . GERD (gastroesophageal reflux disease)   . Hypertension   . Personal history of fall   . Edema   . Unspecified hereditary and idiopathic peripheral neuropathy   . Pain in thoracic spine   . Abnormality of gait   . Urinary frequency   . Other protein-calorie malnutrition   . Gastric ulcer, unspecified as acute or chronic, without mention of hemorrhage, perforation, or obstruction   . Loss of weight   . Closed fracture of seventh cervical vertebra without mention of spinal cord injury   . Alzheimer's disease   . Unspecified vitamin D deficiency   . External hemorrhoids without mention of complication   . Edema   . Unspecified hypothyroidism   . Depressive disorder, not elsewhere classified   . Unspecified constipation   . Calculus of kidney   . Osteoarthrosis, unspecified whether generalized or localized, unspecified site   . Insomnia,  unspecified    Past Surgical History  Procedure Laterality Date  . Wrist surgery Right 2013   Social History:   reports that she has never smoked. She does not have any smokeless tobacco history on file. She reports that she does not drink alcohol or use illicit drugs.  Family History  Problem Relation Age of Onset  . Cancer Mother   . CVA Father   . Cancer Brother   . Alcohol abuse Sister     Medications: Patient's Medications  New Prescriptions   No medications on file  Previous Medications   AMITRIPTYLINE (ELAVIL) 50 MG TABLET    Take 1 tablet (50 mg total) by mouth at bedtime. 1 tablet at bed to help sleep and appetite   DULOXETINE (CYMBALTA) 60 MG CAPSULE    Take 1 capsule (60 mg total) by mouth daily.   LEVOTHYROXINE (SYNTHROID, LEVOTHROID) 88 MCG TABLET    TAKE 1 TABLET DAILY BEFORE BREAKFAST.   LOSARTAN (COZAAR) 100 MG TABLET    Take 1 tablet (100 mg total) by mouth daily.   MEMANTINE HCL-DONEPEZIL HCL (NAMZARIC) 28-10 MG CP24    Take 1 tablet by mouth at bedtime.   POLYETHYLENE GLYCOL POWDER (GLYCOLAX/MIRALAX) POWDER    17 grams daly in 4-8 oz water to prevent constipation   SENNA (SENOKOT) 8.6 MG TABS TABLET    2 at bed to help prevent constipation  VITAMIN D, ERGOCALCIFEROL, (DRISDOL) 50000 UNITS CAPS CAPSULE    Take 1 capsule (50,000 Units total) by mouth every 7 (seven) days.  Modified Medications   No medications on file  Discontinued Medications   No medications on file     Physical Exam:  Filed Vitals:   12/28/14 0950  BP: 170/110  Pulse: 96  Temp: 98.4 F (36.9 C)  TempSrc: Oral  Resp: 18  Height: 5\' 6"  (1.676 m)  Weight: 130 lb 3.2 oz (59.058 kg)  SpO2: 96%   Body mass index is 21.02 kg/(m^2).  Physical Exam  Constitutional:  Thin. Frail.  HENT:  Head: Normocephalic and atraumatic.  Right Ear: External ear normal.  Left Ear: External ear normal.  Nose: Nose normal.  Mouth/Throat: Oropharynx is clear and moist.  Eyes: Conjunctivae and  EOM are normal. Pupils are equal, round, and reactive to light.  Neck: Normal range of motion. Neck supple.  Cardiovascular: Normal rate, regular rhythm and normal heart sounds.   Pulmonary/Chest: Effort normal and breath sounds normal.  Abdominal: Soft. Bowel sounds are normal. She exhibits no distension and no mass. There is no tenderness.  Musculoskeletal: Normal range of motion.  Neurological: She is alert. No cranial nerve deficit. Coordination abnormal.  Severe memory loss. 2 staples removed from scalp, small Hematoma noted  Psychiatric:  Poor judgment. Impulsive.    Labs reviewed: Basic Metabolic Panel:  Recent Labs  03/30/14 1655 10/12/14 1619 12/19/14 1941  NA 140 141 138  K 3.9 4.6 3.9  CL 100 99 103  CO2 27 27 27   GLUCOSE 89 102* 117*  BUN 26 22 18   CREATININE 1.06* 1.44* 0.87  CALCIUM 8.6* 9.0 9.3  TSH 3.480 2.030  --    Liver Function Tests:  Recent Labs  03/30/14 1655 10/12/14 1619 12/19/14 1941  AST 32 29 28  ALT 29 21 21   ALKPHOS 94 106 93  BILITOT 0.3 0.4 0.8  PROT 6.6 6.4 7.4  ALBUMIN 4.3 4.2 4.4   No results for input(s): LIPASE, AMYLASE in the last 8760 hours. No results for input(s): AMMONIA in the last 8760 hours. CBC:  Recent Labs  03/30/14 1655 12/19/14 1941  WBC 4.6 6.6  NEUTROABS 2.5 4.7  HGB 12.5 13.0  HCT 36.3 37.8  MCV 101* 101.9*  PLT 184 171   Lipid Panel: No results for input(s): CHOL, HDL, LDLCALC, TRIG, CHOLHDL, LDLDIRECT in the last 8760 hours. TSH:  Recent Labs  03/30/14 1655 10/12/14 1619  TSH 3.480 2.030   A1C: Lab Results  Component Value Date   HGBA1C 5.3 08/30/2010   Ct Head Wo Contrast  12/19/2014  CLINICAL DATA:  Patient fell on brick wall. Loss of consciousness. History of dementia. EXAM: CT HEAD WITHOUT CONTRAST CT MAXILLOFACIAL WITHOUT CONTRAST CT CERVICAL SPINE WITHOUT CONTRAST TECHNIQUE: Multidetector CT imaging of the head, cervical spine, and maxillofacial structures were performed using the  standard protocol without intravenous contrast. Multiplanar CT image reconstructions of the cervical spine and maxillofacial structures were also generated. COMPARISON:  CT head; CT cervical spine February 16, 2013 FINDINGS: CT HEAD FINDINGS Mild diffuse atrophy is stable. There is a stable 1.2 x 0.7 cm calcified meningioma in the left cerebellar region abutting the dura without surrounding edema. There is no other mass. There is no intracranial hemorrhage, extra-axial fluid collection, or midline shift. There is extensive small vessel disease throughout the centra semiovale bilaterally. There is small vessel disease throughout the internal capsules bilaterally, slightly more on the right than on  the left. No acute infarct is evident. There does pain left parietal scalp hematoma. There is a smaller posterior left frontal scalp hematoma. The bony calvarium appears intact. Mastoid air cells are clear. CT MAXILLOFACIAL FINDINGS There is left-sided facial swelling. There is no demonstrable fracture or dislocation. No intraorbital lesions are identified. Orbits appear symmetric bilaterally. There is mucosal thickening in several ethmoid air cells bilaterally. Other paranasal sinuses are clear. Ostiomeatal unit complexes are patent bilaterally. There is slight leftward deviation of the nasal septum. Salivary glands appear symmetric and normal bilaterally. No adenopathy is appreciable. CT CERVICAL SPINE FINDINGS There is no acute fracture or spondylolisthesis. Stable endplate concavity at the superior aspect of T1 is a stable finding and may represent residua of old trauma but may also be of degenerative type etiology. Prevertebral soft tissues and predental space regions are normal. There is marked disc space narrowing at C3-4 and C5-6. There is moderate disc space narrowing at C4-5, C6-7, and C7-T1. There is facet hypertrophy at multiple levels bilaterally. Exit foraminal narrowing is most pronounced at C3-4 bilaterally,  C4-5 on the right, C5-6 bilaterally, and C6-7 on the right. No frank disc extrusion or stenosis. There is mild calcification in the left carotid artery. IMPRESSION: CT head: Atrophy with extensive small vessel disease, stable. No acute infarct is evident. No hemorrhage is appreciable. Calcified meningioma in the left cerebellar region is stable without surrounding edema. There are scalp hematomas on the left. No fractures. CT maxillofacial: Left-sided facial swelling. No fracture or dislocation. Mild ethmoid sinus disease bilaterally. Other paranasal sinuses are clear. Ostiomeatal unit complexes are patent bilaterally. No intraorbital lesions. Mild leftward deviation of the nasal septum. CT cervical spine: Multilevel arthropathy. No acute fracture or spondylolisthesis. Mild calcification in the left carotid artery. Electronically Signed   By: Lowella Grip III M.D.   On: 12/19/2014 19:11   Ct Cervical Spine Wo Contrast  12/19/2014  CLINICAL DATA:  Patient fell on brick wall. Loss of consciousness. History of dementia. EXAM: CT HEAD WITHOUT CONTRAST CT MAXILLOFACIAL WITHOUT CONTRAST CT CERVICAL SPINE WITHOUT CONTRAST TECHNIQUE: Multidetector CT imaging of the head, cervical spine, and maxillofacial structures were performed using the standard protocol without intravenous contrast. Multiplanar CT image reconstructions of the cervical spine and maxillofacial structures were also generated. COMPARISON:  CT head; CT cervical spine February 16, 2013 FINDINGS: CT HEAD FINDINGS Mild diffuse atrophy is stable. There is a stable 1.2 x 0.7 cm calcified meningioma in the left cerebellar region abutting the dura without surrounding edema. There is no other mass. There is no intracranial hemorrhage, extra-axial fluid collection, or midline shift. There is extensive small vessel disease throughout the centra semiovale bilaterally. There is small vessel disease throughout the internal capsules bilaterally, slightly more on  the right than on the left. No acute infarct is evident. There does pain left parietal scalp hematoma. There is a smaller posterior left frontal scalp hematoma. The bony calvarium appears intact. Mastoid air cells are clear. CT MAXILLOFACIAL FINDINGS There is left-sided facial swelling. There is no demonstrable fracture or dislocation. No intraorbital lesions are identified. Orbits appear symmetric bilaterally. There is mucosal thickening in several ethmoid air cells bilaterally. Other paranasal sinuses are clear. Ostiomeatal unit complexes are patent bilaterally. There is slight leftward deviation of the nasal septum. Salivary glands appear symmetric and normal bilaterally. No adenopathy is appreciable. CT CERVICAL SPINE FINDINGS There is no acute fracture or spondylolisthesis. Stable endplate concavity at the superior aspect of T1 is a stable finding and may  represent residua of old trauma but may also be of degenerative type etiology. Prevertebral soft tissues and predental space regions are normal. There is marked disc space narrowing at C3-4 and C5-6. There is moderate disc space narrowing at C4-5, C6-7, and C7-T1. There is facet hypertrophy at multiple levels bilaterally. Exit foraminal narrowing is most pronounced at C3-4 bilaterally, C4-5 on the right, C5-6 bilaterally, and C6-7 on the right. No frank disc extrusion or stenosis. There is mild calcification in the left carotid artery. IMPRESSION: CT head: Atrophy with extensive small vessel disease, stable. No acute infarct is evident. No hemorrhage is appreciable. Calcified meningioma in the left cerebellar region is stable without surrounding edema. There are scalp hematomas on the left. No fractures. CT maxillofacial: Left-sided facial swelling. No fracture or dislocation. Mild ethmoid sinus disease bilaterally. Other paranasal sinuses are clear. Ostiomeatal unit complexes are patent bilaterally. No intraorbital lesions. Mild leftward deviation of the  nasal septum. CT cervical spine: Multilevel arthropathy. No acute fracture or spondylolisthesis. Mild calcification in the left carotid artery. Electronically Signed   By: Lowella Grip III M.D.   On: 12/19/2014 19:11   Ct Maxillofacial Wo Cm  12/19/2014  CLINICAL DATA:  Patient fell on brick wall. Loss of consciousness. History of dementia. EXAM: CT HEAD WITHOUT CONTRAST CT MAXILLOFACIAL WITHOUT CONTRAST CT CERVICAL SPINE WITHOUT CONTRAST TECHNIQUE: Multidetector CT imaging of the head, cervical spine, and maxillofacial structures were performed using the standard protocol without intravenous contrast. Multiplanar CT image reconstructions of the cervical spine and maxillofacial structures were also generated. COMPARISON:  CT head; CT cervical spine February 16, 2013 FINDINGS: CT HEAD FINDINGS Mild diffuse atrophy is stable. There is a stable 1.2 x 0.7 cm calcified meningioma in the left cerebellar region abutting the dura without surrounding edema. There is no other mass. There is no intracranial hemorrhage, extra-axial fluid collection, or midline shift. There is extensive small vessel disease throughout the centra semiovale bilaterally. There is small vessel disease throughout the internal capsules bilaterally, slightly more on the right than on the left. No acute infarct is evident. There does pain left parietal scalp hematoma. There is a smaller posterior left frontal scalp hematoma. The bony calvarium appears intact. Mastoid air cells are clear. CT MAXILLOFACIAL FINDINGS There is left-sided facial swelling. There is no demonstrable fracture or dislocation. No intraorbital lesions are identified. Orbits appear symmetric bilaterally. There is mucosal thickening in several ethmoid air cells bilaterally. Other paranasal sinuses are clear. Ostiomeatal unit complexes are patent bilaterally. There is slight leftward deviation of the nasal septum. Salivary glands appear symmetric and normal bilaterally. No  adenopathy is appreciable. CT CERVICAL SPINE FINDINGS There is no acute fracture or spondylolisthesis. Stable endplate concavity at the superior aspect of T1 is a stable finding and may represent residua of old trauma but may also be of degenerative type etiology. Prevertebral soft tissues and predental space regions are normal. There is marked disc space narrowing at C3-4 and C5-6. There is moderate disc space narrowing at C4-5, C6-7, and C7-T1. There is facet hypertrophy at multiple levels bilaterally. Exit foraminal narrowing is most pronounced at C3-4 bilaterally, C4-5 on the right, C5-6 bilaterally, and C6-7 on the right. No frank disc extrusion or stenosis. There is mild calcification in the left carotid artery. IMPRESSION: CT head: Atrophy with extensive small vessel disease, stable. No acute infarct is evident. No hemorrhage is appreciable. Calcified meningioma in the left cerebellar region is stable without surrounding edema. There are scalp hematomas on the left. No  fractures. CT maxillofacial: Left-sided facial swelling. No fracture or dislocation. Mild ethmoid sinus disease bilaterally. Other paranasal sinuses are clear. Ostiomeatal unit complexes are patent bilaterally. No intraorbital lesions. Mild leftward deviation of the nasal septum. CT cervical spine: Multilevel arthropathy. No acute fracture or spondylolisthesis. Mild calcification in the left carotid artery. Electronically Signed   By: Lowella Grip III M.D.   On: 12/19/2014 19:11    Assessment/Plan 1. Hx of fracture of vertebral column -will get HM DEXA SCAN  2. Fall, subsequent encounter -pt lives alone, very unsteady gait, refuses to use assistive devices. Recent fall which required hospital visit with staples, work up was negative for  fracture, hemorrhage.  3. Laceration Healing well, 2 staples removed, pt tolerated well  4. Loss of weight Has been stable, will stop elavil at this time as friend does not feel like she is  even taking routinely   5. Vitamin D deficiency -Vit D level reviewed and stable, will cont supplement monthly at this time.  - Vitamin D, Ergocalciferol, (DRISDOL) 50000 UNITS CAPS capsule; Take 1 capsule (50,000 Units total) by mouth every 30 (thirty) days.  Dispense: 90 capsule; Refill: 4  6. Dementia with behavioral disturbance -encouraged increased supervision by family and friends, conts on namzaric daily -recommended 24 hour care or [place].   7. Essential hypertension -blood pressure elevated but took medication just prior to appt. Improved on recheck.  -to check at home and if sbp remains over 140  -cont on cozaar 100 mg daily    Jaleiah Asay K. Harle Battiest  [facility] [phone] 8 am - 5 pm) [phone] (after hours)

## 2015-01-02 ENCOUNTER — Ambulatory Visit: Payer: Commercial Managed Care - HMO | Admitting: Nurse Practitioner

## 2015-01-05 ENCOUNTER — Other Ambulatory Visit: Payer: Self-pay

## 2015-01-09 ENCOUNTER — Ambulatory Visit: Payer: Self-pay | Admitting: Nurse Practitioner

## 2015-01-18 NOTE — Addendum Note (Signed)
Addended by: Rafael Bihari A on: 01/18/2015 12:11 PM   Modules accepted: Orders

## 2015-01-23 ENCOUNTER — Encounter: Payer: Self-pay | Admitting: *Deleted

## 2015-01-23 ENCOUNTER — Telehealth: Payer: Self-pay | Admitting: *Deleted

## 2015-01-23 NOTE — Telephone Encounter (Signed)
Yes, I can fill this out. Please put one on my desk

## 2015-01-23 NOTE — Telephone Encounter (Signed)
Charna Busman with [employer] called and requested Dr. Nyoka Cowden to fill out a FL2 Form for patient due to patient moving to [place]. She wants to know if you are willing to fill one out for patient. Would like it faxed when finished [phone] Please Advise.

## 2015-01-23 NOTE — Telephone Encounter (Signed)
Filled out FL2 Form and given to Dr. Nyoka Cowden to review and sign.

## 2015-01-26 ENCOUNTER — Telehealth: Payer: Self-pay | Admitting: *Deleted

## 2015-01-26 NOTE — Telephone Encounter (Signed)
[name], caregiver called and stated that patient needed lab appointment to follow up renal function. I reviewed patient's chart and [name] stated back in August on labs that patient needed a 3 month OV to follow up her renal function. Called and left message for her to call back to schedule a follow up appointment.

## 2015-02-06 ENCOUNTER — Ambulatory Visit: Payer: Commercial Managed Care - HMO | Admitting: Internal Medicine

## 2015-02-13 ENCOUNTER — Telehealth: Payer: Self-pay | Admitting: Nurse Practitioner

## 2015-02-13 NOTE — Telephone Encounter (Signed)
Forms dropped off by patients daughter for completion from [employer] (Examination for [employer] status or permanent need for regular aid and attendance)   Patients daughter completed the office forms and I put in the MA's box for review

## 2015-02-16 NOTE — Telephone Encounter (Signed)
Received [facility] of [facility] Form for examination for house bound status. Daughter, [name] [name] is trying to get financial help with patient.  Given to [name] to review and sign.

## 2015-02-22 NOTE — Telephone Encounter (Signed)
Form filled out and signed by Dr. Nyoka Cowden. Patient needs appointment to follow up with Dr. Nyoka Cowden. Patient daughter, [name] [phone] notified (LM on Voicemail) to pick up forms and schedule an appointment. Forms left up front.

## 2015-03-02 ENCOUNTER — Telehealth: Payer: Self-pay | Admitting: Internal Medicine

## 2015-03-07 ENCOUNTER — Ambulatory Visit (INDEPENDENT_AMBULATORY_CARE_PROVIDER_SITE_OTHER): Payer: Commercial Managed Care - HMO | Admitting: Internal Medicine

## 2015-03-07 ENCOUNTER — Encounter: Payer: Self-pay | Admitting: Internal Medicine

## 2015-03-07 VITALS — BP 144/92 | HR 90 | Temp 98.3°F | Resp 20 | Ht 66.0 in | Wt 131.6 lb

## 2015-03-07 DIAGNOSIS — F0280 Dementia in other diseases classified elsewhere without behavioral disturbance: Secondary | ICD-10-CM

## 2015-03-07 DIAGNOSIS — F0391 Unspecified dementia with behavioral disturbance: Secondary | ICD-10-CM | POA: Diagnosis not present

## 2015-03-07 DIAGNOSIS — F332 Major depressive disorder, recurrent severe without psychotic features: Secondary | ICD-10-CM

## 2015-03-07 DIAGNOSIS — E039 Hypothyroidism, unspecified: Secondary | ICD-10-CM | POA: Diagnosis not present

## 2015-03-07 DIAGNOSIS — E559 Vitamin D deficiency, unspecified: Secondary | ICD-10-CM | POA: Insufficient documentation

## 2015-03-07 DIAGNOSIS — I10 Essential (primary) hypertension: Secondary | ICD-10-CM | POA: Diagnosis not present

## 2015-03-07 DIAGNOSIS — G301 Alzheimer's disease with late onset: Secondary | ICD-10-CM

## 2015-03-07 DIAGNOSIS — K5900 Constipation, unspecified: Secondary | ICD-10-CM

## 2015-03-07 DIAGNOSIS — G2581 Restless legs syndrome: Secondary | ICD-10-CM

## 2015-03-07 DIAGNOSIS — F03918 Unspecified dementia, unspecified severity, with other behavioral disturbance: Secondary | ICD-10-CM

## 2015-03-07 MED ORDER — AMITRIPTYLINE HCL 50 MG PO TABS: ORAL_TABLET | ORAL | Status: DC

## 2015-03-07 MED ORDER — DULOXETINE HCL 60 MG PO CPEP: ORAL_CAPSULE | ORAL | Status: DC

## 2015-03-07 MED ORDER — MEMANTINE HCL-DONEPEZIL HCL ER 28-10 MG PO CP24: 1.0000 | ORAL_CAPSULE | Freq: Every day | ORAL | Status: DC

## 2015-03-07 MED ORDER — LOSARTAN POTASSIUM 100 MG PO TABS: ORAL_TABLET | ORAL | Status: DC

## 2015-03-07 MED ORDER — LEVOTHYROXINE SODIUM 88 MCG PO TABS: ORAL_TABLET | ORAL | Status: DC

## 2015-03-07 MED ORDER — VITAMIN D (ERGOCALCIFEROL) 1.25 MG (50000 UNIT) PO CAPS: ORAL_CAPSULE | ORAL | Status: DC

## 2015-03-07 NOTE — Telephone Encounter (Addendum)
Forms given to patient at appointment on 03/07/15. Copy sent for scanning.

## 2015-03-07 NOTE — Progress Notes (Signed)
Patient ID: [name], female   DOB: 1931-03-02, 82 y.o.   MRN: [id number]    Facility  [facility]    Place of Service:   OFFICE    No Known Allergies  Chief Complaint  Patient presents with  . Medical Management of Chronic Issues    f/u -HTN    HPI:   Dementia with behavioral disturbance - patient is doing well at home. She had a brief stay at an assisted-living facility, but costs were high, so family decided to bring her back home again  Severe episode of recurrent major depressive disorder, without psychotic features ([place]) - remained stable on duloxetine  Hypothyroidism, unspecified hypothyroidism type - compensated  Essential hypertension - mild elevation in the office today, but patient improved while waiting to nearly normal pressure.  Constipation, unspecified constipation type - improved. Stopped using Senokot, but occasionally uses MiraLAX.  Restless legs - legs remain in good shape as long as she takes amitriptyline. Daughter has attempted to withdraw this medicine twice and patient soon begins to complain of problems with her legs at night.  Late onset Alzheimer's disease without behavioral disturbance - stable on Namzeric -   Vitamin D deficiency - improved on high-dose vitamin D supplementation      Medications: Patient's Medications  New Prescriptions   No medications on file  Previous Medications   AMITRIPTYLINE (ELAVIL) 50 MG TABLET       DULOXETINE (CYMBALTA) 60 MG CAPSULE    Take 1 capsule (60 mg total) by mouth daily.   LEVOTHYROXINE (SYNTHROID, LEVOTHROID) 88 MCG TABLET    TAKE 1 TABLET DAILY BEFORE BREAKFAST.   LOSARTAN (COZAAR) 100 MG TABLET    Take 1 tablet (100 mg total) by mouth daily.   MEMANTINE HCL-DONEPEZIL HCL (NAMZARIC) 28-10 MG CP24    Take 1 tablet by mouth at bedtime.   POLYETHYLENE GLYCOL POWDER (GLYCOLAX/MIRALAX) POWDER    17 grams daly in 4-8 oz water to prevent constipation   VITAMIN D, ERGOCALCIFEROL, (DRISDOL) 50000 UNITS CAPS  CAPSULE    Take 1 capsule (50,000 Units total) by mouth every 30 (thirty) days.  Modified Medications   No medications on file  Discontinued Medications   SENNA (SENOKOT) 8.6 MG TABS TABLET    2 at bed to help prevent constipation    Review of Systems  Constitutional: Positive for activity change, appetite change and fatigue. Negative for fever.       Thin. Frail. Elderly. Poor appetite  HENT: Positive for hearing loss. Negative for congestion, mouth sores, sinus pressure and sore throat.   Eyes: Negative.   Respiratory: Negative.   Cardiovascular: Negative.   Gastrointestinal: Positive for constipation. Negative for nausea, abdominal pain, diarrhea and abdominal distention.  Endocrine: Negative.   Genitourinary: Negative.   Musculoskeletal: Positive for back pain.       Previous right forearm injury has healed. Unstable gait.  Skin: Negative.   Allergic/Immunologic: Negative.   Neurological:       Poor balance. Demented.  Hematological: Negative.   Psychiatric/Behavioral: Positive for confusion, sleep disturbance, dysphoric mood and decreased concentration. The patient is nervous/anxious.        Brief episodes of agitation. Very poor memory.    Filed Vitals:   03/07/15 1454  BP: 160/100  Pulse: 105  Temp: 98.3 F (36.8 C)  TempSrc: Oral  Resp: 20  Height: 5' 6" (1.676 m)  Weight: 131 lb 9.6 oz (59.693 kg)  SpO2: 96%   Body mass index is 21.25 kg/(m^2).  Filed Weights   03/07/15 1454  Weight: 131 lb 9.6 oz (59.693 kg)     Physical Exam  Constitutional:  Thin. Frail.  HENT:  Head: Normocephalic and atraumatic.  Right Ear: External ear normal.  Left Ear: External ear normal.  Nose: Nose normal.  Mouth/Throat: Oropharynx is clear and moist.  Eyes: Conjunctivae and EOM are normal. Pupils are equal, round, and reactive to light.  Neck: No JVD present. No tracheal deviation present. No thyromegaly present.  Cardiovascular: Normal rate, regular rhythm and normal  heart sounds.  Exam reveals no gallop and no friction rub.   No murmur heard. 2/6 LSB murmur. Diminshed pedal pulses.  Pulmonary/Chest: No respiratory distress. She has no wheezes. She has no rales.  Abdominal: She exhibits no distension and no mass. There is no tenderness.  Previously described 2.5 inch epigastric mass that is tender to palpation is not palpable today. I do not feel AAA.  Musculoskeletal: Normal range of motion. She exhibits edema (Trace bipedal edema).  Lymphadenopathy:    She has no cervical adenopathy.  Neurological: No cranial nerve deficit. Coordination abnormal.  Severe memory loss. Reduce sensation to touch and vibration in both great toes. No tremor. Mini-Mental Status exam 09/12/09 was 25/30. Mild clock drawing test.  Skin: No rash noted. No erythema. No pallor.  Psychiatric:  Poor judgment. Impulsive. Some depression and tearfulness, but not often.    Labs reviewed: Lab Summary Latest Ref Rng 12/19/2014 10/12/2014 03/30/2014  Hemoglobin 12.0 - 15.0 g/dL 13.0 (None) 12.5  Hematocrit 36.0 - 46.0 % 37.8 (None) 36.3  White count 4.0 - 10.5 K/uL 6.6 (None) 4.6  Platelet count 150 - 400 K/uL 171 (None) 184  Sodium 135 - 145 mmol/L 138 141 140  Potassium 3.5 - 5.1 mmol/L 3.9 4.6 3.9  Calcium 8.9 - 10.3 mg/dL 9.3 9.0 8.6(L)  Phosphorus - (None) (None) (None)  Creatinine 0.44 - 1.00 mg/dL 0.87 1.44(H) 1.06(H)  AST 15 - 41 U/L 28 29 32  Alk Phos 38 - 126 U/L 93 106 94  Bilirubin 0.3 - 1.2 mg/dL 0.8 0.4 0.3  Glucose 65 - 99 mg/dL 117(H) 102(H) 89  Cholesterol - (None) (None) (None)  HDL cholesterol - (None) (None) (None)  Triglycerides - (None) (None) (None)  LDL Direct - (None) (None) (None)  LDL Calc - (None) (None) (None)  Total protein 6.5 - 8.1 g/dL 7.4 (None) (None)  Albumin 3.5 - 5.0 g/dL 4.4 4.2 4.3   Lab Results  Component Value Date   TSH 2.030 10/12/2014   TSH 3.480 03/30/2014   TSH 6.020* 07/13/2013   Lab Results  Component Value Date   BUN 18  12/19/2014   BUN 22 10/12/2014   BUN 26 03/30/2014   Lab Results  Component Value Date   HGBA1C 5.3 08/30/2010    Assessment/Plan  1. Dementia with behavioral disturbance  Continue Namzeric  2. Severe episode of recurrent major depressive disorder, without psychotic features ([place]) - DULoxetine (CYMBALTA) 60 MG capsule; One daily for depression and anxiety  Dispense: 90 capsule; Refill: 3  3. Hypothyroidism, unspecified hypothyroidism type - levothyroxine (SYNTHROID, LEVOTHROID) 88 MCG tablet; One daly for thyroid supplement  Dispense: 90 tablet; Refill: 4  4. Essential hypertension - losartan (COZAAR) 100 MG tablet; One daily to lower BP  Dispense: 90 tablet; Refill: 4  5. Constipation, unspecified constipation type  Continue MiraLAX when necessary  6. Restless legs - amitriptyline (ELAVIL) 50 MG tablet; One nightly to prevent leg cramps  Dispense: 90 tablet;  Refill: 4  7. Late onset Alzheimer's disease without behavioral disturbance  Continue Namzeric  - Memantine HCl-Donepezil HCl (NAMZARIC) 28-10 MG CP24; Take 1 tablet by mouth at bedtime. One at bed to stabilize memory  Dispense: 90 capsule; Refill: 4  8. Vitamin D deficiency - Vitamin D, Ergocalciferol, (DRISDOL) 50000 units CAPS capsule; One every month for Vitamin D supplement  Dispense: 90 capsule; Refill: 4

## 2015-04-24 ENCOUNTER — Ambulatory Visit: Payer: Commercial Managed Care - HMO | Admitting: Nurse Practitioner

## 2015-10-09 ENCOUNTER — Encounter: Payer: Self-pay | Admitting: Nurse Practitioner

## 2015-10-09 ENCOUNTER — Ambulatory Visit (INDEPENDENT_AMBULATORY_CARE_PROVIDER_SITE_OTHER): Payer: Commercial Managed Care - HMO | Admitting: Nurse Practitioner

## 2015-10-09 ENCOUNTER — Other Ambulatory Visit: Payer: Self-pay | Admitting: Nurse Practitioner

## 2015-10-09 VITALS — BP 142/88 | HR 99 | Temp 98.8°F | Resp 18 | Ht 66.0 in | Wt 143.6 lb

## 2015-10-09 DIAGNOSIS — R634 Abnormal weight loss: Secondary | ICD-10-CM | POA: Diagnosis not present

## 2015-10-09 DIAGNOSIS — R296 Repeated falls: Secondary | ICD-10-CM | POA: Diagnosis not present

## 2015-10-09 DIAGNOSIS — K5900 Constipation, unspecified: Secondary | ICD-10-CM

## 2015-10-09 DIAGNOSIS — E039 Hypothyroidism, unspecified: Secondary | ICD-10-CM

## 2015-10-09 DIAGNOSIS — I10 Essential (primary) hypertension: Secondary | ICD-10-CM

## 2015-10-09 DIAGNOSIS — F03918 Unspecified dementia, unspecified severity, with other behavioral disturbance: Secondary | ICD-10-CM

## 2015-10-09 DIAGNOSIS — M159 Polyosteoarthritis, unspecified: Secondary | ICD-10-CM | POA: Diagnosis not present

## 2015-10-09 DIAGNOSIS — F0391 Unspecified dementia with behavioral disturbance: Secondary | ICD-10-CM

## 2015-10-09 DIAGNOSIS — E559 Vitamin D deficiency, unspecified: Secondary | ICD-10-CM | POA: Diagnosis not present

## 2015-10-09 DIAGNOSIS — G2581 Restless legs syndrome: Secondary | ICD-10-CM | POA: Diagnosis not present

## 2015-10-09 LAB — CBC WITH DIFFERENTIAL/PLATELET
Basophils Absolute: 0 cells/uL (ref 0–200)
Basophils Relative: 0 %
Eosinophils Absolute: 120 cells/uL (ref 15–500)
Eosinophils Relative: 2 %
HCT: 37.9 % (ref 35.0–45.0)
Hemoglobin: 12.5 g/dL (ref 11.7–15.5)
Lymphocytes Relative: 27 %
Lymphs Abs: 1620 cells/uL (ref 850–3900)
MCH: 32.7 pg (ref 27.0–33.0)
MCHC: 33 g/dL (ref 32.0–36.0)
MCV: 99.2 fL (ref 80.0–100.0)
MPV: 9.9 fL (ref 7.5–12.5)
Monocytes Absolute: 540 cells/uL (ref 200–950)
Monocytes Relative: 9 %
Neutro Abs: 3720 cells/uL (ref 1500–7800)
Neutrophils Relative %: 62 %
Platelets: 212 10*3/uL (ref 140–400)
RBC: 3.82 MIL/uL (ref 3.80–5.10)
RDW: 16.5 % — ABNORMAL HIGH (ref 11.0–15.0)
WBC: 6 10*3/uL (ref 3.8–10.8)

## 2015-10-09 MED ORDER — AMLODIPINE BESYLATE 5 MG PO TABS: 5.0000 mg | ORAL_TABLET | Freq: Every day | ORAL | 1 refills | Status: DC

## 2015-10-09 MED ORDER — VITAMIN D (ERGOCALCIFEROL) 1.25 MG (50000 UNIT) PO CAPS: 50000.0000 [IU] | ORAL_CAPSULE | ORAL | 1 refills | Status: DC

## 2015-10-09 MED ORDER — RISPERIDONE 0.25 MG PO TABS
0.2500 mg | ORAL_TABLET | Freq: Every day | ORAL | 1 refills | Status: DC
Start: 2015-10-09 — End: 2015-10-09

## 2015-10-09 MED ORDER — LINACLOTIDE 145 MCG PO CAPS: 145.0000 ug | ORAL_CAPSULE | Freq: Every day | ORAL | 1 refills | Status: DC

## 2015-10-09 NOTE — Patient Instructions (Addendum)
Will get labs today Follow up in 1 month  Will add risperdal 0.25 mg at night for mood  To start linzess 145 mcg daily for constipation  Tylenol 500 mg 2 tablets every 8 hours as needed for pain No NSAIDS (motrin, Advil, aleve, ibuprofen)   Home health therapy to evaluate and treat

## 2015-10-09 NOTE — Progress Notes (Signed)
Careteam: Patient Care Team: Estill Dooms, MD as PCP - General (Internal Medicine)  Advanced Directive information Does patient have an advance directive?: Yes, Type of Advance Directive: [place];Out of facility DNR (pink MOST or yellow form)  No Known Allergies  Chief Complaint  Patient presents with  . Medical Management of Chronic Issues    1 year follow up.      HPI: Patient is a 82 y.o. female seen in the office today for routine follow up. Saw Dr Nyoka Cowden in January.  Daughter here with her today.  pts (other) daughter is living her currently Reports she is very easily agitated and easily angered.  Increased depression. Was on zoloft in the past had to change to Cymbalta   Takes her medication most days, most weeks she will take her medication 5-6 out of 7 days.   Complaints of knee pain frequently. Has osteoarthritis at this time.  Falling more frequently. Falls about twice a week. Daughter questions vertigo.  Has walker and a cane but will not use them.  More recent fall, now with skin tear.   Last November was at [facility] and was there for 2 weeks and did really well after that when she was in respite care. Has not done therapy after that.   Increase in constipation- will not take miralax which the daughter buys for her.    Review of Systems:  Review of Systems  Constitutional: Positive for activity change, appetite change and fatigue. Negative for chills and fever.       Thin. Frail. Elderly. Poor appetite  HENT: Positive for hearing loss. Negative for congestion, mouth sores, sinus pressure and sore throat.   Eyes: Negative.   Respiratory: Negative for cough, shortness of breath and wheezing.   Cardiovascular: Negative for chest pain, palpitations and leg swelling.  Gastrointestinal: Positive for constipation. Negative for abdominal distention, abdominal pain, diarrhea and nausea.  Endocrine: Negative.   Genitourinary: Negative.   Negative for dysuria.  Musculoskeletal: Positive for arthralgias and back pain. Negative for myalgias.        Unstable gait.  Skin: Negative for rash.  Allergic/Immunologic: Negative.   Neurological: Negative for dizziness, tremors, weakness and headaches.       Poor balance. Demented.  Hematological: Negative.   Psychiatric/Behavioral: Positive for agitation, behavioral problems, confusion, decreased concentration, dysphoric mood and sleep disturbance. The patient is nervous/anxious.        Brief episodes of agitation. Very poor memory.    Past Medical History:  Diagnosis Date  . Abnormality of gait   . Alzheimer's disease   . Calculus of kidney   . Closed fracture of seventh cervical vertebra without mention of spinal cord injury   . Depressive disorder, not elsewhere classified   . Edema   . Edema   . External hemorrhoids without mention of complication   . Gastric ulcer, unspecified as acute or chronic, without mention of hemorrhage, perforation, or obstruction   . GERD (gastroesophageal reflux disease)   . Hypertension   . Insomnia, unspecified   . Loss of weight   . Osteoarthrosis, unspecified whether generalized or localized, unspecified site   . Other protein-calorie malnutrition   . Pain in thoracic spine   . Personal history of fall   . Unspecified constipation   . Unspecified hereditary and idiopathic peripheral neuropathy   . Unspecified hypothyroidism   . Unspecified vitamin D deficiency   . Urinary frequency    Past Surgical History:  Procedure Laterality Date  . WRIST SURGERY Right 2013   Social History:   reports that she has never smoked. She has never used smokeless tobacco. She reports that she does not drink alcohol or use drugs.  Family History  Problem Relation Age of Onset  . Cancer Mother   . CVA Father   . Cancer Brother   . Alcohol abuse Sister     Medications: Patient's Medications  New Prescriptions   No medications on file    Previous Medications   AMITRIPTYLINE (ELAVIL) 50 MG TABLET    One nightly to prevent leg cramps   DULOXETINE (CYMBALTA) 60 MG CAPSULE    One daily for depression and anxiety   LEVOTHYROXINE (SYNTHROID, LEVOTHROID) 88 MCG TABLET    One daly for thyroid supplement   LOSARTAN (COZAAR) 100 MG TABLET    One daily to lower BP   MEMANTINE HCL-DONEPEZIL HCL (NAMZARIC) 28-10 MG CP24    Take 1 tablet by mouth at bedtime. One at bed to stabilize memory   POLYETHYLENE GLYCOL POWDER (GLYCOLAX/MIRALAX) POWDER    17 grams daly in 4-8 oz water to prevent constipation  Modified Medications   No medications on file  Discontinued Medications   VITAMIN D, ERGOCALCIFEROL, (DRISDOL) 50000 UNITS CAPS CAPSULE    One every month for Vitamin D supplement     Physical Exam:  Vitals:   10/09/15 1556  BP: (!) 142/88  Pulse: 99  Resp: 18  Temp: 98.8 F (37.1 C)  TempSrc: Oral  SpO2: 96%  Weight: 143 lb 9.6 oz (65.1 kg)  Height: 5\' 6"  (1.676 m)   Body mass index is 23.18 kg/m.  Physical Exam  Constitutional:  Thin. Frail.  HENT:  Head: Normocephalic and atraumatic.  Right Ear: External ear normal.  Left Ear: External ear normal.  Nose: Nose normal.  Mouth/Throat: Oropharynx is clear and moist.  Eyes: Conjunctivae and EOM are normal. Pupils are equal, round, and reactive to light.  Neck: Normal range of motion. Neck supple.  Cardiovascular: Normal rate, regular rhythm and normal heart sounds.   Pulmonary/Chest: Effort normal and breath sounds normal.  Abdominal: Soft. Bowel sounds are normal. She exhibits no distension. There is no tenderness.  Rounded abdomen  Musculoskeletal: She exhibits edema (trace) and tenderness (to knees).  Neurological: She is alert. No cranial nerve deficit. Coordination abnormal.  Severe memory loss.  Skin:  Bruising noted to left forearm Skin tear to right forearm, no infection noted  Psychiatric:  Poor judgment. Impulsive.    Labs reviewed: Basic Metabolic  Panel:  Recent Labs  10/12/14 1619 12/19/14 1941  NA 141 138  K 4.6 3.9  CL 99 103  CO2 27 27  GLUCOSE 102* 117*  BUN 22 18  CREATININE 1.44* 0.87  CALCIUM 9.0 9.3  TSH 2.030  --    Liver Function Tests:  Recent Labs  10/12/14 1619 12/19/14 1941  AST 29 28  ALT 21 21  ALKPHOS 106 93  BILITOT 0.4 0.8  PROT 6.4 7.4  ALBUMIN 4.2 4.4   No results for input(s): LIPASE, AMYLASE in the last 8760 hours. No results for input(s): AMMONIA in the last 8760 hours. CBC:  Recent Labs  12/19/14 1941  WBC 6.6  NEUTROABS 4.7  HGB 13.0  HCT 37.8  MCV 101.9*  PLT 171   Lipid Panel: No results for input(s): CHOL, HDL, LDLCALC, TRIG, CHOLHDL, LDLDIRECT in the last 8760 hours. TSH:  Recent Labs  10/12/14 1619  TSH 2.030  A1C: Lab Results  Component Value Date   HGBA1C 5.3 08/30/2010     Assessment/Plan 1. Hypothyroidism, unspecified hypothyroidism type -conts on synthroid, will follow up TSH  2. Constipation, unspecified constipation type - linaclotide (LINZESS) 145 MCG CAPS capsule; Take 1 capsule (145 mcg total) by mouth daily before breakfast.  Dispense: 30 capsule; Refill: 1  3. Dementia with behavioral disturbance -worsening behaviors, conts on namzaric, will start low does risperdal for mood - risperiDONE (RISPERDAL) 0.25 MG tablet; Take 1 tablet (0.25 mg total) by mouth at bedtime.  Dispense: 30 tablet; Refill: 1  4. Restless legs - conts on elavil   5. Essential hypertension -blood pressure elevated -norvasc 5 mg po daily for better control -limit sodium intake, eating a lot of chips and salty food  - CBC with Differential/Platelets - COMPLETE METABOLIC PANEL WITH GFR  6. Loss of weight -weight gain noted, liberalized diet.   7. Vitamin D deficiency - Vitamin D, 25-hydroxy - Vitamin D, Ergocalciferol, (DRISDOL) 50000 units CAPS capsule; Take 1 capsule (50,000 Units total) by mouth every 7 (seven) days.  Dispense: 12 capsule; Refill: 1  8.  Multiple falls -does not use appropriate assistive device, increased weakness - Ambulatory referral to [place]  9. OA -to use tylenol as needed -avoid NSAIDS  Total time 50 mins  time greater than 50% of total time spent doing pt counseled and coordination of care regarding multiple medical conditions   Anda Sobotta K. Harle Battiest  [facility] [phone] 8 am - 5 pm) [phone] (after hours)

## 2015-10-10 LAB — COMPLETE METABOLIC PANEL WITH GFR
ALT: 13 U/L (ref 6–29)
AST: 20 U/L (ref 10–35)
Albumin: 4 g/dL (ref 3.6–5.1)
Alkaline Phosphatase: 86 U/L (ref 33–130)
BUN: 22 mg/dL (ref 7–25)
CO2: 27 mmol/L (ref 20–31)
Calcium: 8.9 mg/dL (ref 8.6–10.4)
Chloride: 103 mmol/L (ref 98–110)
Creat: 1.04 mg/dL — ABNORMAL HIGH (ref 0.60–0.88)
GFR, Est African American: 58 mL/min — ABNORMAL LOW (ref >=60)
GFR, Est Non African American: 50 mL/min — ABNORMAL LOW (ref >=60)
Glucose, Bld: 94 mg/dL (ref 65–99)
Potassium: 4.1 mmol/L (ref 3.5–5.3)
Sodium: 140 mmol/L (ref 135–146)
Total Bilirubin: 0.5 mg/dL (ref 0.2–1.2)
Total Protein: 6.7 g/dL (ref 6.1–8.1)

## 2015-10-10 LAB — VITAMIN D 25 HYDROXY (VIT D DEFICIENCY, FRACTURES): Vit D, 25-Hydroxy: 25 ng/mL — ABNORMAL LOW (ref 30–100)

## 2015-10-10 LAB — TSH: TSH: 2.03 mIU/L

## 2015-10-11 DIAGNOSIS — Z9181 History of falling: Secondary | ICD-10-CM | POA: Diagnosis not present

## 2015-10-11 DIAGNOSIS — F329 Major depressive disorder, single episode, unspecified: Secondary | ICD-10-CM | POA: Diagnosis not present

## 2015-10-11 DIAGNOSIS — F0281 Dementia in other diseases classified elsewhere with behavioral disturbance: Secondary | ICD-10-CM | POA: Diagnosis not present

## 2015-10-11 DIAGNOSIS — R262 Difficulty in walking, not elsewhere classified: Secondary | ICD-10-CM | POA: Diagnosis not present

## 2015-10-11 DIAGNOSIS — M1990 Unspecified osteoarthritis, unspecified site: Secondary | ICD-10-CM | POA: Diagnosis not present

## 2015-10-11 DIAGNOSIS — M6281 Muscle weakness (generalized): Secondary | ICD-10-CM | POA: Diagnosis not present

## 2015-10-11 DIAGNOSIS — I10 Essential (primary) hypertension: Secondary | ICD-10-CM | POA: Diagnosis not present

## 2015-10-11 DIAGNOSIS — G309 Alzheimer's disease, unspecified: Secondary | ICD-10-CM | POA: Diagnosis not present

## 2015-10-12 ENCOUNTER — Telehealth: Payer: Self-pay | Admitting: *Deleted

## 2015-10-12 NOTE — Telephone Encounter (Signed)
Thank you :)

## 2015-10-12 NOTE — Telephone Encounter (Signed)
[name], daughter called and stated that patient's BP was 200/100 yesterday when PT took it. She stated that Tuesday when they were seen [name] stopped the Losartan and added Amlodipine. I reviewed OV note from [place] visit on 8/22 and it doesn't state to stop the Losartan but to add Amlodipine. Informed daughter to continue the Losartan and the Amlodipine. She stated she misunderstood and will start it back.

## 2015-10-25 ENCOUNTER — Encounter (HOSPITAL_COMMUNITY): Payer: Self-pay

## 2015-10-25 ENCOUNTER — Emergency Department (HOSPITAL_COMMUNITY)
Admission: EM | Admit: 2015-10-25 | Discharge: 2015-10-26 | Disposition: A | Discharge status: Home / Self Care | Payer: Commercial Managed Care - HMO | Attending: Emergency Medicine | Admitting: Emergency Medicine

## 2015-10-25 DIAGNOSIS — E039 Hypothyroidism, unspecified: Secondary | ICD-10-CM | POA: Insufficient documentation

## 2015-10-25 DIAGNOSIS — R262 Difficulty in walking, not elsewhere classified: Secondary | ICD-10-CM | POA: Diagnosis not present

## 2015-10-25 DIAGNOSIS — S098XXA Other specified injuries of head, initial encounter: Secondary | ICD-10-CM | POA: Diagnosis not present

## 2015-10-25 DIAGNOSIS — S0083XA Contusion of other part of head, initial encounter: Secondary | ICD-10-CM

## 2015-10-25 DIAGNOSIS — W19XXXA Unspecified fall, initial encounter: Secondary | ICD-10-CM

## 2015-10-25 DIAGNOSIS — S01501A Unspecified open wound of lip, initial encounter: Secondary | ICD-10-CM | POA: Diagnosis present

## 2015-10-25 DIAGNOSIS — F329 Major depressive disorder, single episode, unspecified: Secondary | ICD-10-CM | POA: Diagnosis not present

## 2015-10-25 DIAGNOSIS — Y92030 Kitchen in apartment as the place of occurrence of the external cause: Secondary | ICD-10-CM | POA: Insufficient documentation

## 2015-10-25 DIAGNOSIS — S0181XA Laceration without foreign body of other part of head, initial encounter: Secondary | ICD-10-CM | POA: Diagnosis not present

## 2015-10-25 DIAGNOSIS — S01511A Laceration without foreign body of lip, initial encounter: Secondary | ICD-10-CM | POA: Diagnosis not present

## 2015-10-25 DIAGNOSIS — Z9181 History of falling: Secondary | ICD-10-CM | POA: Diagnosis not present

## 2015-10-25 DIAGNOSIS — Y939 Activity, unspecified: Secondary | ICD-10-CM | POA: Insufficient documentation

## 2015-10-25 DIAGNOSIS — W228XXA Striking against or struck by other objects, initial encounter: Secondary | ICD-10-CM | POA: Diagnosis not present

## 2015-10-25 DIAGNOSIS — M6281 Muscle weakness (generalized): Secondary | ICD-10-CM | POA: Diagnosis not present

## 2015-10-25 DIAGNOSIS — Y999 Unspecified external cause status: Secondary | ICD-10-CM | POA: Insufficient documentation

## 2015-10-25 DIAGNOSIS — G309 Alzheimer's disease, unspecified: Secondary | ICD-10-CM | POA: Diagnosis not present

## 2015-10-25 DIAGNOSIS — M1990 Unspecified osteoarthritis, unspecified site: Secondary | ICD-10-CM | POA: Diagnosis not present

## 2015-10-25 DIAGNOSIS — Z79899 Other long term (current) drug therapy: Secondary | ICD-10-CM | POA: Diagnosis not present

## 2015-10-25 DIAGNOSIS — I10 Essential (primary) hypertension: Secondary | ICD-10-CM | POA: Insufficient documentation

## 2015-10-25 DIAGNOSIS — F0281 Dementia in other diseases classified elsewhere with behavioral disturbance: Secondary | ICD-10-CM | POA: Diagnosis not present

## 2015-10-25 NOTE — ED Triage Notes (Signed)
Pt brought in by EMS with following GLF where pt hit her face on the kitchen floor, fall was witnessed by family. Pt hx of dementia. Pt a/o at her baseline. Laceration to upper and lower lip; bleeding controlled at this time. Denies blood thinner use. 125 CBG.

## 2015-10-25 NOTE — ED Notes (Signed)
Pt reports that she was sitting in a chair earlier this evening when she stood up and then suffered a fall.  Pt reports that she struck her right side. Swelling and laceration noted to pt's left upper lip at this time.  Pt's left front tooth is noted to be chipped as well.   Chief Complaint  Patient presents with  . Fall   Past Medical History:  Diagnosis Date  . Abnormality of gait   . Alzheimer's disease   . Calculus of kidney   . Closed fracture of seventh cervical vertebra without mention of spinal cord injury   . Depressive disorder, not elsewhere classified   . Edema   . Edema   . External hemorrhoids without mention of complication   . Gastric ulcer, unspecified as acute or chronic, without mention of hemorrhage, perforation, or obstruction   . GERD (gastroesophageal reflux disease)   . Hypertension   . Insomnia, unspecified   . Loss of weight   . Osteoarthrosis, unspecified whether generalized or localized, unspecified site   . Other protein-calorie malnutrition   . Pain in thoracic spine   . Personal history of fall   . Unspecified constipation   . Unspecified hereditary and idiopathic peripheral neuropathy   . Unspecified hypothyroidism   . Unspecified vitamin D deficiency   . Urinary frequency

## 2015-10-25 NOTE — ED Provider Notes (Signed)
[facility] DEPT Provider Note   CSN: [id number] Arrival date & time: 10/25/15  2237  By signing my name below, I, Maud Deed. Royston Sinner, attest that this documentation has been prepared under the direction and in the presence of Jola Schmidt, MD.  Electronically Signed: Maud Deed. Royston Sinner, ED Scribe. 10/25/15. 12:07 AM.    History   Chief Complaint No chief complaint on file.  The history is provided by the patient. No language interpreter was used.   LEVEL 5 CAVEAT DUE TO DEMENTIA   HPI Comments: [name], brought in by EMS is a 82 y.o. female with a PMHx of dementia, HTN, and osteoarthritis who presents to the Emergency Department here after a witnessed fall sustained just prior to arrival. Per granddaughter, pt was sitting and went to stand up when she lost her balance. She then fell to the ground landing on her R side. Family states pt hit her face against the kitchen floor resulting in some open wounds to the upper and lower lip. Bleeding controlled at this time. No recent fever or chills.  PCP: Jeanmarie Hubert, MD    Past Medical History:  Diagnosis Date  . Abnormality of gait   . Alzheimer's disease   . Calculus of kidney   . Closed fracture of seventh cervical vertebra without mention of spinal cord injury   . Depressive disorder, not elsewhere classified   . Edema   . Edema   . External hemorrhoids without mention of complication   . Gastric ulcer, unspecified as acute or chronic, without mention of hemorrhage, perforation, or obstruction   . GERD (gastroesophageal reflux disease)   . Hypertension   . Insomnia, unspecified   . Loss of weight   . Osteoarthrosis, unspecified whether generalized or localized, unspecified site   . Other protein-calorie malnutrition   . Pain in thoracic spine   . Personal history of fall   . Unspecified constipation   . Unspecified hereditary and idiopathic peripheral neuropathy   . Unspecified hypothyroidism   . Unspecified vitamin D  deficiency   . Urinary frequency     Patient Active Problem List   Diagnosis Date Noted  . Vitamin D deficiency 03/07/2015  . Hx of fracture of vertebral column 12/28/2014  . Constipation 07/27/2013  . Fracture of vertebra 07/27/2013  . Loss of weight 07/13/2013  . Peripheral vascular disease, unspecified ([place]) 07/13/2013  . Epigastric mass 07/13/2013  . Dementia with behavioral disturbance   . Major depression ([place])   . Insomnia, unspecified   . Fall 09/07/2012  . Fracture of right distal radius 09/07/2012  . Hypothyroidism 08/18/2006  . HYPERCHOLESTEROLEMIA 08/18/2006  . Essential hypertension 08/18/2006  . Restless legs 08/18/2006    Past Surgical History:  Procedure Laterality Date  . WRIST SURGERY Right 2013    OB History    No data available       Home Medications    Prior to Admission medications   Medication Sig Start Date End Date Taking? Authorizing Provider  amitriptyline (ELAVIL) 50 MG tablet One nightly to prevent leg cramps 03/07/15   Estill Dooms, MD  amLODipine (NORVASC) 5 MG tablet TAKE 1 TABLET(5 MG) BY MOUTH DAILY 10/10/15   [name], NP  DULoxetine (CYMBALTA) 60 MG capsule One daily for depression and anxiety 03/07/15   Estill Dooms, MD  levothyroxine (SYNTHROID, LEVOTHROID) 88 MCG tablet One daly for thyroid supplement 03/07/15   Estill Dooms, MD  linaclotide [facility]) 145 MCG CAPS capsule Take  1 capsule (145 mcg total) by mouth daily before breakfast. 10/09/15   [name], NP  losartan (COZAAR) 100 MG tablet One daily to lower BP 03/07/15   Estill Dooms, MD  Memantine HCl-Donepezil HCl [facility]) 28-10 MG CP24 Take 1 tablet by mouth at bedtime. One at bed to stabilize memory 03/07/15   Estill Dooms, MD  polyethylene glycol powder [facility]) powder 17 grams daly in 4-8 oz water to prevent constipation 07/27/13   Estill Dooms, MD  risperiDONE (RISPERDAL) 0.25 MG tablet TAKE 1 TABLET(0.25 MG) BY MOUTH AT BEDTIME 10/10/15    [name], NP  Vitamin D, Ergocalciferol, (DRISDOL) 50000 units CAPS capsule Take 1 capsule (50,000 Units total) by mouth every 7 (seven) days. 10/09/15   [name], NP    Family History Family History  Problem Relation Age of Onset  . Cancer Mother   . CVA Father   . Cancer Brother   . Alcohol abuse Sister     Social History Social History  Substance Use Topics  . Smoking status: Never Smoker  . Smokeless tobacco: Never Used  . Alcohol use No     Allergies   Review of patient's allergies indicates no known allergies.   Review of Systems Review of Systems  Unable to perform ROS: Dementia     Physical Exam Updated Vital Signs BP 191/87   Pulse 77   Temp 98 F (36.7 C) (Oral)   Resp 11   Wt 140 lb (63.5 kg)   SpO2 95%   BMI 22.60 kg/m   Physical Exam  Constitutional: She is oriented to person, place, and time. She appears well-developed and well-nourished. No distress.  HENT:  Head: Normocephalic.  No trismus or malocclusion. 1 cm laceration to L upper lip involving the vermilion border.  Eyes: EOM are normal.  Neck: Normal range of motion.  No C-spine tenderness.  Cardiovascular: Normal rate, regular rhythm and normal heart sounds.   Pulmonary/Chest: Effort normal and breath sounds normal.  Abdominal: Soft. She exhibits no distension. There is no tenderness.  Musculoskeletal: Normal range of motion.  FROM of bilateral knees, hips, shoulders, wrists.  Neurological: She is alert and oriented to person, place, and time.  Skin: Skin is warm and dry.  Nursing note and vitals reviewed.    ED Treatments / Results   DIAGNOSTIC STUDIES: Oxygen Saturation is 95% on RA, adequate by my interpretation.    COORDINATION OF CARE: 11:51 PM- Will repair laceration to upper lip. Discussed treatment plan with pt at bedside and pt agreed to plan.    Labs (all labs ordered are listed, but only abnormal results are displayed) Labs Reviewed - No data to  display  EKG  EKG Interpretation None       Radiology No results found.  Procedures .Marland KitchenLaceration Repair Date/Time: 10/26/2015 12:03 AM Performed by: Jola Schmidt Authorized by: Jola Schmidt   Consent:    Consent obtained:  Verbal   Consent given by:  Patient   Risks discussed:  Infection Anesthesia (see MAR for exact dosages):    Anesthesia method:  None Laceration details:    Location:  Lip   Lip location:  Upper exterior lip   Length (cm):  1 Repair type:    Repair type:  Intermediate Pre-procedure details:    Preparation:  Patient was prepped and draped in usual sterile fashion Exploration:    Wound exploration: entire depth of wound probed and visualized     Contaminated: no  Treatment:    Amount of cleaning:  Standard   Visualized foreign bodies/material removed: no   Skin repair:    Repair method:  Sutures   Suture size:  6-0   Suture material:  Chromic gut   Number of sutures:  1 Approximation:    Approximation:  Close   Vermilion border: well-aligned   Post-procedure details:    Patient tolerance of procedure:  Tolerated well, no immediate complications Comments:     6-0 chromic gut used to repair a 1 cm laceration to the upper lip which did involve the vermilion border.    (including critical care time)  Medications Ordered in ED Medications - No data to display   Initial Impression / Assessment and Plan / ED Course  I have reviewed the triage vital signs and the nursing notes.  Pertinent labs & imaging results that were available during my care of the patient were reviewed by me and considered in my medical decision making (see chart for details).  Clinical Course    Vermilion border laceration repair.  No injury to the mandible.  No trismus or malocclusion.  No indication for CT imaging of the head.  No head injury.  C-spine nontender.  Head injury warnings given to family.  Infection warnings given.  Full range of motion of major joints.   Overall well-appearing.  Final Clinical Impressions(s) / ED Diagnoses   Final diagnoses:  Fall, initial encounter  Facial contusion, initial encounter  Lip laceration, initial encounter    New Prescriptions New Prescriptions   No medications on file   I personally performed the services described in this documentation, which was scribed in my presence. The recorded information has been reviewed and is accurate.        Jola Schmidt, MD 10/26/15 [phone]

## 2015-10-26 NOTE — ED Notes (Signed)
Pt provided with d/c instructions at this time. Pt verbalizes understanding of d/c instructions as well as follow up procedure after d/c.  No new RX at time of d/c.  Pt in no apparent distress at this time. Pt assisted out of the ED via WC at this time.   

## 2015-10-26 NOTE — ED Notes (Signed)
Bacitracin ointment applied to pt's sutured area at this time.  Pt tolerated application without complaint.

## 2015-10-30 DIAGNOSIS — F329 Major depressive disorder, single episode, unspecified: Secondary | ICD-10-CM | POA: Diagnosis not present

## 2015-10-30 DIAGNOSIS — R262 Difficulty in walking, not elsewhere classified: Secondary | ICD-10-CM | POA: Diagnosis not present

## 2015-10-30 DIAGNOSIS — M1990 Unspecified osteoarthritis, unspecified site: Secondary | ICD-10-CM | POA: Diagnosis not present

## 2015-10-30 DIAGNOSIS — G309 Alzheimer's disease, unspecified: Secondary | ICD-10-CM | POA: Diagnosis not present

## 2015-10-30 DIAGNOSIS — Z9181 History of falling: Secondary | ICD-10-CM | POA: Diagnosis not present

## 2015-10-30 DIAGNOSIS — M6281 Muscle weakness (generalized): Secondary | ICD-10-CM | POA: Diagnosis not present

## 2015-10-30 DIAGNOSIS — F0281 Dementia in other diseases classified elsewhere with behavioral disturbance: Secondary | ICD-10-CM | POA: Diagnosis not present

## 2015-10-30 DIAGNOSIS — I10 Essential (primary) hypertension: Secondary | ICD-10-CM | POA: Diagnosis not present

## 2015-11-12 ENCOUNTER — Ambulatory Visit: Payer: Commercial Managed Care - HMO | Admitting: Nurse Practitioner

## 2015-11-20 ENCOUNTER — Telehealth: Payer: Self-pay

## 2015-11-20 NOTE — Telephone Encounter (Signed)
I called patient's daughter, [name], to see about changing patient's 11/27/15 2:45 extended visit at [facility] request. [name] stated that patient needed to have a longer time slot than 30 min due to complex history of patient. [name] stated that she could not come in any earlier than the time that was scheduled. [name] asked that patient change the appointment to another day, even if it is late in the day. [name] stated that we can block the time slot following the new appointment time. I tried to call [name] back but a man answered the phone and stated that [name] would not be available until after 5 pm today. I will try to speak with [name] on Thursday to try to reschedule the day.   Also, the Medicare Well Visit that is scheduled on 11/27/15 at 3:15 will need to be rescheduled for a time before rescheduled extended visit.

## 2015-11-22 ENCOUNTER — Telehealth: Payer: Self-pay | Admitting: Internal Medicine

## 2015-11-22 NOTE — Telephone Encounter (Signed)
left vm msg for [name] stating that we need to cancel the AWV scheduled for 10/9 but pt will still see Jessica on 10/10. VDM (dee-dee)

## 2015-11-26 ENCOUNTER — Ambulatory Visit: Payer: Commercial Managed Care - HMO

## 2015-11-27 ENCOUNTER — Ambulatory Visit (INDEPENDENT_AMBULATORY_CARE_PROVIDER_SITE_OTHER): Payer: Commercial Managed Care - HMO | Admitting: Nurse Practitioner

## 2015-11-27 ENCOUNTER — Encounter: Payer: Self-pay | Admitting: Nurse Practitioner

## 2015-11-27 ENCOUNTER — Ambulatory Visit: Payer: Self-pay

## 2015-11-27 VITALS — BP 144/88 | HR 89 | Temp 98.2°F | Resp 17 | Ht 66.0 in | Wt 136.4 lb

## 2015-11-27 DIAGNOSIS — K5900 Constipation, unspecified: Secondary | ICD-10-CM | POA: Diagnosis not present

## 2015-11-27 DIAGNOSIS — F332 Major depressive disorder, recurrent severe without psychotic features: Secondary | ICD-10-CM | POA: Diagnosis not present

## 2015-11-27 DIAGNOSIS — G308 Other Alzheimer's disease: Secondary | ICD-10-CM

## 2015-11-27 DIAGNOSIS — F0281 Dementia in other diseases classified elsewhere with behavioral disturbance: Secondary | ICD-10-CM

## 2015-11-27 DIAGNOSIS — Z0000 Encounter for general adult medical examination without abnormal findings: Secondary | ICD-10-CM

## 2015-11-27 DIAGNOSIS — M1991 Primary osteoarthritis, unspecified site: Secondary | ICD-10-CM | POA: Diagnosis not present

## 2015-11-27 DIAGNOSIS — F02818 Dementia in other diseases classified elsewhere, unspecified severity, with other behavioral disturbance: Secondary | ICD-10-CM

## 2015-11-27 DIAGNOSIS — R634 Abnormal weight loss: Secondary | ICD-10-CM | POA: Diagnosis not present

## 2015-11-27 DIAGNOSIS — Z23 Encounter for immunization: Secondary | ICD-10-CM

## 2015-11-27 MED ORDER — DICLOFENAC SODIUM 1 % TD GEL: 2.0000 g | Freq: Four times a day (QID) | TRANSDERMAL | 2 refills | Status: DC | PRN

## 2015-11-27 MED ORDER — VENLAFAXINE HCL ER 75 MG PO CP24: 75.0000 mg | ORAL_CAPSULE | Freq: Every day | ORAL | 1 refills | Status: DC

## 2015-11-27 MED ORDER — LINACLOTIDE 72 MCG PO CAPS: 72.0000 ug | ORAL_CAPSULE | Freq: Every day | ORAL | 3 refills | Status: DC

## 2015-11-27 NOTE — Progress Notes (Signed)
Provider: Jeanmarie Hubert, MD  Patient Care Team: Estill Dooms, MD as PCP - General (Internal Medicine)  Extended Emergency Contact Information Primary Emergency Contact: Bottomley,Marsha Address: [place], [place] Phone: [phone] Relation: Daughter Secondary Emergency Contact: [name],[name] Address: [address]          [place], [place] [address] [place] Phone: [phone] Relation: Daughter No Known Allergies Code Status: DNR Goals of Care: Advanced Directive information Advanced Directives 11/27/2015  Does patient have an advance directive? Yes  Type of Advance Directive [place]  Does patient want to make changes to advanced directive? -  Copy of advanced directive(s) in chart? Yes  Would patient like information on creating an advanced directive? -     Chief Complaint  Patient presents with  . Medical Management of Chronic Issues    Extended Visit. Failed clock drawing.   [name], in room    HPI: Patient is a 82 y.o. female seen in today for an annual wellness exam.   Major illnesses or hospitalization in the last year none Multiple falls which she is has gone to the ED for.  Lives with her daughter   Depression screen [facility] 2/9 11/27/2015 03/07/2015 10/06/2012  Decreased Interest 3 2 0  Down, Depressed, Hopeless 3 0 0  PHQ - 2 Score 6 2 0  Altered sleeping 2 1 -  Tired, decreased energy 3 0 -  Change in appetite 2 0 -  Feeling bad or failure about yourself  0 0 -  Trouble concentrating 3 0 -  Moving slowly or fidgety/restless 2 0 -  Suicidal thoughts 0 0 -  PHQ-9 Score 18 3 -  Difficult doing work/chores Very difficult - -  pt with depression. Daughter reports mood goes and comes, yesterday was a good day. Does feel like she is not as angry. Depression has not changed. Sleeping more during the day.   Fall Risk  11/27/2015 10/09/2015 03/07/2015  12/28/2014 10/12/2014  Falls in the past year? Yes Yes No No Yes  Number falls in past yr: 2 or more 2 or more - - 2 or more  Injury with Fall? Yes Yes - - Yes  Risk Factor Category  High Fall Risk High Fall Risk - - -  Risk for fall due to : - Impaired balance/gait - - -  more falls since on Risperdal.    MMSE - Mini Mental State Exam 11/27/2015 03/30/2014  Orientation to time 0 2  Orientation to Place 2 2  Registration 3 3  Attention/ Calculation 0 0  Recall 0 0  Language- name 2 objects 2 2  Language- repeat 1 1  Language- follow 3 step command 3 3  Language- read & follow direction 1 1  Write a sentence 1 0  Copy design 0 0  Total score 13 14  taking namzaric    Health Maintenance  Topic Date Due  . PNA vac Low Risk Adult (1 of 2 - PCV13) 03/17/1998  . INFLUENZA VACCINE  02/18/2016 (Originally 09/18/2015)  . ZOSTAVAX  02/18/2016 (Originally 03/17/1993)  . TETANUS/TDAP  02/18/2016 (Originally 03/17/1952)  . DEXA SCAN  Completed    Urinary incontinence? none Functional Status Survey: Is the patient deaf or have difficulty hearing?: No Does the patient have difficulty seeing, even when wearing glasses/contacts?: No Does the patient have difficulty concentrating,  remembering, or making decisions?: Yes Does the patient have difficulty walking or climbing stairs?: Yes Does the patient have difficulty dressing or bathing?: Yes Does the patient have difficulty doing errands alone such as visiting a doctor's office or shopping?: Yes     Diet? Poor diet, does not eat much, liberalized diet  No exam data present  has not been to ophthalmology in many years.  Dentition: not routinely, recommend every 6 months  Pain: left shoulder and bilateral knees does not take medication routinely for it because she hates to take pills. Tylenol does help when she takes it.   Pt was having behaviors, started on risperdal, it was making her sleepy so her daughter is only giving it to her every  other day.  Alternating it with amitriptyline every other night.  Does not complain of leg cramps.   Taking linzess- was having diarrhea, cut it back to every other day and starting having diarrhea every 5th day.  Not taking any other stool softener or laxative.   Past Medical History:  Diagnosis Date  . Abnormality of gait   . Alzheimer's disease   . Calculus of kidney   . Closed fracture of seventh cervical vertebra without mention of spinal cord injury   . Depressive disorder, not elsewhere classified   . Edema   . Edema   . External hemorrhoids without mention of complication   . Gastric ulcer, unspecified as acute or chronic, without mention of hemorrhage, perforation, or obstruction   . GERD (gastroesophageal reflux disease)   . Hypertension   . Insomnia, unspecified   . Loss of weight   . Osteoarthrosis, unspecified whether generalized or localized, unspecified site   . Other protein-calorie malnutrition   . Pain in thoracic spine   . Personal history of fall   . Unspecified constipation   . Unspecified hereditary and idiopathic peripheral neuropathy   . Unspecified hypothyroidism   . Unspecified vitamin D deficiency   . Urinary frequency     Past Surgical History:  Procedure Laterality Date  . WRIST SURGERY Right 2013    Social History   Social History  . Marital status: Widowed    Spouse name: N/A  . Number of children: N/A  . Years of education: N/A   Social History Main Topics  . Smoking status: Never Smoker  . Smokeless tobacco: Never Used  . Alcohol use No  . Drug use: No  . Sexual activity: No   Other Topics Concern  . None   Social History Narrative  . None    Family History  Problem Relation Age of Onset  . Cancer Mother   . CVA Father   . Cancer Brother   . Alcohol abuse Sister     Review of Systems:  Review of Systems  Constitutional: Positive for activity change, appetite change and fatigue. Negative for chills and fever.        Thin. Frail. Elderly. Poor appetite  HENT: Positive for hearing loss. Negative for congestion, mouth sores, sinus pressure and sore throat.   Eyes: Negative.   Respiratory: Negative for cough, shortness of breath and wheezing.   Cardiovascular: Negative for chest pain, palpitations and leg swelling.  Gastrointestinal: Negative for abdominal distention, abdominal pain, constipation, diarrhea and nausea.  Endocrine: Negative.   Genitourinary: Negative.  Negative for dysuria.  Musculoskeletal: Positive for arthralgias and back pain. Negative for myalgias.        Unstable gait. Frequent falls   Skin: Negative for rash.  Allergic/Immunologic: Negative.   Neurological: Negative for dizziness, tremors, weakness and headaches.       Poor balance. Demented.  Hematological: Negative.   Psychiatric/Behavioral: Positive for agitation, behavioral problems, confusion, decreased concentration, dysphoric mood and sleep disturbance. The patient is nervous/anxious.        Brief episodes of agitation. Very poor memory.       Medication List       Accurate as of 11/27/15  3:39 PM. Always use your most recent med list.          amLODipine 5 MG tablet Commonly known as:  NORVASC TAKE 1 TABLET(5 MG) BY MOUTH DAILY   DULoxetine 60 MG capsule Commonly known as:  CYMBALTA One daily for depression and anxiety   levothyroxine 88 MCG tablet Commonly known as:  SYNTHROID, LEVOTHROID One daly for thyroid supplement   linaclotide 145 MCG Caps capsule Commonly known as:  LINZESS Take 1 capsule (145 mcg total) by mouth daily before breakfast.   losartan 100 MG tablet Commonly known as:  COZAAR One daily to lower BP   Memantine HCl-Donepezil HCl 28-10 MG Cp24 Commonly known as:  NAMZARIC Take 1 tablet by mouth at bedtime. One at bed to stabilize memory   polyethylene glycol powder powder Commonly known as:  GLYCOLAX/MIRALAX 17 grams daly in 4-8 oz water to prevent constipation   risperiDONE  0.25 MG tablet Commonly known as:  RISPERDAL Take one tablet by mouth every other night. Alternate with amitriptyline   Vitamin D (Ergocalciferol) 50000 units Caps capsule Commonly known as:  DRISDOL Take 1 capsule (50,000 Units total) by mouth every 7 (seven) days.         Physical Exam: Vitals:   11/27/15 1506  BP: (!) 160/87  Pulse: 89  Resp: 17  Temp: 98.2 F (36.8 C)  TempSrc: Oral  SpO2: 95%  Weight: 136 lb 6.4 oz (61.9 kg)  Height: 5\' 6"  (1.676 m)   Body mass index is 22.02 kg/m. Physical Exam  Constitutional:  Thin. Frail. female  HENT:  Head: Normocephalic and atraumatic.  Right Ear: External ear normal.  Left Ear: External ear normal.  Nose: Nose normal.  Mouth/Throat: Oropharynx is clear and moist.  Eyes: Conjunctivae and EOM are normal. Pupils are equal, round, and reactive to light.  Neck: Normal range of motion. Neck supple. No JVD present. No tracheal deviation present. No thyromegaly present.  Cardiovascular: Normal rate, regular rhythm and normal heart sounds.   No murmur heard. 2/6 LSB murmur. Diminshed pedal pulses.  Pulmonary/Chest: No respiratory distress. She has no wheezes. She has no rales.  Abdominal: Soft. Bowel sounds are normal. She exhibits no distension and no mass. There is no tenderness. There is no rebound.  Rounded abdomen, soft, nontender  Musculoskeletal: Normal range of motion. She exhibits edema (Trace bipedal edema).  Lymphadenopathy:    She has no cervical adenopathy.  Neurological: No cranial nerve deficit. Coordination abnormal.  Severe memory loss. Reduce sensation to touch and vibration in both great toes. No tremor. Mini-Mental Status exam 09/12/09 was 25/30.  MMSE 13/30 -- 11/2015   Skin: No rash noted. No erythema. No pallor.  Psychiatric:  Poor judgment. Impulsive. depression    Labs reviewed: Basic Metabolic Panel:  Recent Labs  12/19/14 1941 10/09/15 1646  NA 138 140  K 3.9 4.1  CL 103 103  CO2 27 27    GLUCOSE 117* 94  BUN 18 22  CREATININE 0.87 1.04*  CALCIUM 9.3 8.9  TSH  --  2.03   Liver Function Tests:  Recent Labs  12/19/14 1941 10/09/15 1646  AST 28 20  ALT 21 13  ALKPHOS 93 86  BILITOT 0.8 0.5  PROT 7.4 6.7  ALBUMIN 4.4 4.0   No results for input(s): LIPASE, AMYLASE in the last 8760 hours. No results for input(s): AMMONIA in the last 8760 hours. CBC:  Recent Labs  12/19/14 1941 10/09/15 1646  WBC 6.6 6.0  NEUTROABS 4.7 3,720  HGB 13.0 12.5  HCT 37.8 37.9  MCV 101.9* 99.2  PLT 171 212   Lipid Panel: No results for input(s): CHOL, HDL, LDLCALC, TRIG, CHOLHDL, LDLDIRECT in the last 8760 hours. Lab Results  Component Value Date   HGBA1C 5.3 08/30/2010    Procedures: No results found.  Assessment/Plan 1. Primary osteoarthritis, unspecified site - diclofenac sodium (VOLTAREN) 1 % GEL; Apply 2 g topically 4 (four) times daily as needed.  Dispense: 100 g; Refill: 2 - DG Knee Complete 4 Views Left; Future - DG Knee Complete 4 Views Right; Future  2. Constipation, unspecified constipation type Improved with linzess however still having occasional diarrhea despite being taken every other day. Will reduce dose to 72 mcg at this time  - linaclotide (LINZESS) 72 MCG capsule; Take 1 capsule (72 mcg total) by mouth daily before breakfast.  Dispense: 30 capsule; Refill: 3  3. Alzheimer's disease of other onset with behavioral disturbance -ongoing memory loss with behaviors. Did not tolerate Risperdal. conts on namzaric   4. Loss of weight Ongoing weight loss noted, encouraged liberalized diet, supplements and to give food with nutritional value. (eats a lot of sweets)  5. Medicare annual wellness visit, subsequent Pt with worsening dementia. Lives with daughter. Requires increase assistance with ADLS. Discussed increasing protein supplements but pt not willing to take routinely. Multiple falls. Will stop Risperdal as she has had more falls since on medication.   MMSE 16/30 which is worse than last year -positive for depression, see number 6.  - DNR (Do Not Resuscitate)  6. Severe episode of recurrent major depressive disorder, without psychotic features [facility]) Daughter requesting her to be changed off cymbalta, has been on medication for over 5 years.  Will stop cymbalta and start effexor daily  - venlafaxine XR (EFFEXOR-XR) 75 MG 24 hr capsule; Take 1 capsule (75 mg total) by mouth daily with breakfast.  Dispense: 60 capsule; Refill: 1 -consider referral to psychiatrist, if this without improvement.   7. Need for pneumococcal vaccination - Pneumococcal conjugate vaccine 13-valent IM  Follow up in 4 weeks with Dr Sharee Holster K. Harle Battiest  [facility] [phone] 8 am - 5 pm) [phone] (after hours)

## 2015-11-27 NOTE — Patient Instructions (Addendum)
STOP amitriptyline  Please notify your pharmacy of any medication that was stopped that is on automatic refill.   Give 1/2 tablet of Risperdal every night for 1 week then STOP  linzess - decrease to 72 mcg every other day.   Back pain- to use biofreeze or bengay or icyhot, or OTC patch   For knee pain to use Voltaren gel 4 times daily as needed- to get xray and make follow up appt for injections  BRING blood pressure log for next visit  STOP Cymbalta and start Effexor 75 mg ER daily

## 2015-11-29 ENCOUNTER — Other Ambulatory Visit: Payer: Self-pay | Admitting: Nurse Practitioner

## 2015-11-29 DIAGNOSIS — I10 Essential (primary) hypertension: Secondary | ICD-10-CM

## 2015-12-05 ENCOUNTER — Other Ambulatory Visit: Payer: Self-pay | Admitting: Nurse Practitioner

## 2015-12-05 DIAGNOSIS — F0391 Unspecified dementia with behavioral disturbance: Secondary | ICD-10-CM

## 2015-12-05 DIAGNOSIS — F03918 Unspecified dementia, unspecified severity, with other behavioral disturbance: Secondary | ICD-10-CM

## 2015-12-27 NOTE — Telephone Encounter (Signed)
Opened in error

## 2016-01-03 ENCOUNTER — Other Ambulatory Visit: Payer: Self-pay | Admitting: *Deleted

## 2016-01-03 ENCOUNTER — Telehealth: Payer: Self-pay | Admitting: *Deleted

## 2016-01-03 DIAGNOSIS — K5900 Constipation, unspecified: Secondary | ICD-10-CM

## 2016-01-03 MED ORDER — LINACLOTIDE 72 MCG PO CAPS: 72.0000 ug | ORAL_CAPSULE | Freq: Every day | ORAL | 3 refills | Status: DC

## 2016-01-03 NOTE — Telephone Encounter (Signed)
error 

## 2016-01-03 NOTE — Telephone Encounter (Signed)
Patient daughter  [name] and requested a refill to be sent to pharmacy.

## 2016-02-28 ENCOUNTER — Telehealth: Payer: Self-pay

## 2016-02-28 DIAGNOSIS — I10 Essential (primary) hypertension: Secondary | ICD-10-CM

## 2016-02-28 NOTE — Telephone Encounter (Signed)
I called patient to see about setting up a lab appointment prior to her appointment of 03/04/15. [name] stated that patient needs a CBC with Diff and CMP.   I left a message for patient's daughter to call the office to set up lab appointment.

## 2016-02-29 ENCOUNTER — Other Ambulatory Visit: Payer: Self-pay

## 2016-02-29 NOTE — Telephone Encounter (Signed)
Orders placed for lab appointment

## 2016-03-03 ENCOUNTER — Other Ambulatory Visit: Payer: Self-pay | Admitting: Nurse Practitioner

## 2016-03-03 ENCOUNTER — Encounter: Payer: Self-pay | Admitting: Nurse Practitioner

## 2016-03-03 ENCOUNTER — Ambulatory Visit (INDEPENDENT_AMBULATORY_CARE_PROVIDER_SITE_OTHER): Payer: Medicare HMO | Admitting: Nurse Practitioner

## 2016-03-03 VITALS — BP 140/82 | HR 76 | Temp 98.0°F | Resp 18 | Ht 66.0 in | Wt 129.4 lb

## 2016-03-03 DIAGNOSIS — M1991 Primary osteoarthritis, unspecified site: Secondary | ICD-10-CM | POA: Diagnosis not present

## 2016-03-03 DIAGNOSIS — I10 Essential (primary) hypertension: Secondary | ICD-10-CM | POA: Diagnosis not present

## 2016-03-03 DIAGNOSIS — E559 Vitamin D deficiency, unspecified: Secondary | ICD-10-CM

## 2016-03-03 DIAGNOSIS — G308 Other Alzheimer's disease: Secondary | ICD-10-CM

## 2016-03-03 DIAGNOSIS — K5900 Constipation, unspecified: Secondary | ICD-10-CM

## 2016-03-03 DIAGNOSIS — F02818 Dementia in other diseases classified elsewhere, unspecified severity, with other behavioral disturbance: Secondary | ICD-10-CM

## 2016-03-03 DIAGNOSIS — F0281 Dementia in other diseases classified elsewhere with behavioral disturbance: Secondary | ICD-10-CM

## 2016-03-03 DIAGNOSIS — F332 Major depressive disorder, recurrent severe without psychotic features: Secondary | ICD-10-CM | POA: Diagnosis not present

## 2016-03-03 DIAGNOSIS — R634 Abnormal weight loss: Secondary | ICD-10-CM

## 2016-03-03 LAB — COMPLETE METABOLIC PANEL WITH GFR
ALT: 10 U/L (ref 6–29)
AST: 18 U/L (ref 10–35)
Albumin: 4.3 g/dL (ref 3.6–5.1)
Alkaline Phosphatase: 71 U/L (ref 33–130)
BUN: 16 mg/dL (ref 7–25)
CO2: 26 mmol/L (ref 20–31)
Calcium: 9.6 mg/dL (ref 8.6–10.4)
Chloride: 105 mmol/L (ref 98–110)
Creat: 0.91 mg/dL — ABNORMAL HIGH (ref 0.60–0.88)
GFR, Est African American: 68 mL/min (ref >=60)
GFR, Est Non African American: 59 mL/min — ABNORMAL LOW (ref >=60)
Glucose, Bld: 98 mg/dL (ref 65–99)
Potassium: 4.5 mmol/L (ref 3.5–5.3)
Sodium: 142 mmol/L (ref 135–146)
Total Bilirubin: 0.7 mg/dL (ref 0.2–1.2)
Total Protein: 6.9 g/dL (ref 6.1–8.1)

## 2016-03-03 MED ORDER — VENLAFAXINE HCL ER 75 MG PO CP24: 75.0000 mg | ORAL_CAPSULE | Freq: Every day | ORAL | 1 refills | Status: DC

## 2016-03-03 MED ORDER — VENLAFAXINE HCL 75 MG PO TABS: 75.0000 mg | ORAL_TABLET | Freq: Two times a day (BID) | ORAL | 1 refills | Status: DC

## 2016-03-03 NOTE — Progress Notes (Signed)
Careteam: Patient Care Team: Estill Dooms, MD as PCP - General (Internal Medicine)  Advanced Directive information Does Patient Have a Medical Advance Directive?: Yes, Type of Advance Directive: [place];Living will  No Known Allergies  Chief Complaint  Patient presents with  . Medical Management of Chronic Issues    Follow up. Needs labs     HPI: Patient is a 83 y.o. female seen in the office today for routine follow up. Pt was supposed to follow up in 4 weeks with Dr Mariea Clonts after last appt but missed visit.  Pt with hx of advanced dementia with behaviors, weight loss, constipation, OA and depression.  At last visit pt was changed from Cymbalta to Effexor and given Voltaren gel for knee pain.  Dose reduction of linzess made due to diarrhea Constipation has improved on linzess 72 mcg.  OA- still with pain not using Voltaren gel, did not want to get xray because she is "not having someone stick a needle in her" Depression- never started Effexor. Daughter reports she forgot about this but would like to try it at this time.  Pt has lost weight since last visit, struggling to get her to eat.   Does not wish to have bone density test. Not on calcium due to his of kidney stones and does not want to have another one of those.   Review of Systems:  Review of Systems  Constitutional: Positive for activity change, appetite change and fatigue. Negative for chills and fever.       Thin. Frail. Elderly. Poor appetite  HENT: Positive for hearing loss. Negative for congestion, mouth sores, sinus pressure and sore throat.   Eyes: Negative.   Respiratory: Positive for wheezing. Negative for cough and shortness of breath.   Cardiovascular: Negative for chest pain, palpitations and leg swelling.  Gastrointestinal: Negative for abdominal distention, abdominal pain, constipation, diarrhea and nausea.  Endocrine: Negative.   Genitourinary: Negative.  Negative for dysuria.    Musculoskeletal: Positive for arthralgias and back pain. Negative for myalgias.       Unstable gait. LESs frequent falls  Skin: Negative for rash.  Allergic/Immunologic: Negative.   Neurological: Negative for dizziness, tremors, weakness and headaches.       Poor balance. Demented.  Hematological: Negative.   Psychiatric/Behavioral: Positive for agitation, behavioral problems, confusion, decreased concentration, dysphoric mood and sleep disturbance. The patient is nervous/anxious.        Brief episodes of agitation. Very poor memory.    Past Medical History:  Diagnosis Date  . Abnormality of gait   . Alzheimer's disease   . Calculus of kidney   . Closed fracture of seventh cervical vertebra without mention of spinal cord injury   . Depressive disorder, not elsewhere classified   . Edema   . Edema   . External hemorrhoids without mention of complication   . Gastric ulcer, unspecified as acute or chronic, without mention of hemorrhage, perforation, or obstruction   . GERD (gastroesophageal reflux disease)   . Hypertension   . Insomnia, unspecified   . Loss of weight   . Osteoarthrosis, unspecified whether generalized or localized, unspecified site   . Other protein-calorie malnutrition   . Pain in thoracic spine   . Personal history of fall   . Unspecified constipation   . Unspecified hereditary and idiopathic peripheral neuropathy   . Unspecified hypothyroidism   . Unspecified vitamin D deficiency   . Urinary frequency    Past Surgical History:  Procedure Laterality Date  . WRIST SURGERY Right 2013   Social History:   reports that she has never smoked. She has never used smokeless tobacco. She reports that she does not drink alcohol or use drugs.  Family History  Problem Relation Age of Onset  . Cancer Mother   . CVA Father   . Cancer Brother   . Alcohol abuse Sister     Medications: Patient's Medications  New Prescriptions   No medications on file  Previous  Medications   AMLODIPINE (NORVASC) 5 MG TABLET    TAKE 1 TABLET(5 MG) BY MOUTH DAILY   DULOXETINE (CYMBALTA) 60 MG CAPSULE    One daily for depression and anxiety   LEVOTHYROXINE (SYNTHROID, LEVOTHROID) 88 MCG TABLET    One daly for thyroid supplement   LINACLOTIDE (LINZESS) 72 MCG CAPSULE    Take 1 capsule (72 mcg total) by mouth daily before breakfast.   LOSARTAN (COZAAR) 100 MG TABLET    One daily to lower BP   MEMANTINE HCL-DONEPEZIL HCL (NAMZARIC) 28-10 MG CP24    Take 1 tablet by mouth at bedtime. One at bed to stabilize memory   VITAMIN D, ERGOCALCIFEROL, (DRISDOL) 50000 UNITS CAPS CAPSULE    Take 1 capsule (50,000 Units total) by mouth every 7 (seven) days.  Modified Medications   No medications on file  Discontinued Medications   DICLOFENAC SODIUM (VOLTAREN) 1 % GEL    Apply 2 g topically 4 (four) times daily as needed.   RISPERIDONE (RISPERDAL) 0.25 MG TABLET    TAKE 1 TABLET BY MOUTH EVERY OTHER DAY   VENLAFAXINE XR (EFFEXOR-XR) 75 MG 24 HR CAPSULE    Take 1 capsule (75 mg total) by mouth daily with breakfast.     Physical Exam:  Vitals:   03/03/16 1527  BP: 140/82  Pulse: 76  Resp: 18  Temp: 98 F (36.7 C)  TempSrc: Oral  SpO2: 94%  Weight: 129 lb 6.4 oz (58.7 kg)  Height: _0  (1.676 m)   Body mass index is 20.89 kg/m.  Physical Exam  Constitutional: No distress.  Thin. Frail. female  HENT:  Head: Normocephalic and atraumatic.  Eyes: Conjunctivae and EOM are normal. Pupils are equal, round, and reactive to light.  Neck: Normal range of motion. Neck supple.  Cardiovascular: Normal rate, regular rhythm and normal heart sounds.   2/6 LSB murmur. Diminshed pedal pulses.  Pulmonary/Chest: Effort normal and breath sounds normal.  Abdominal: Soft. Bowel sounds are normal. She exhibits no distension and no mass. There is no tenderness. There is no rebound.  Rounded abdomen, soft, nontender  Musculoskeletal: Normal range of motion. She exhibits edema.    Neurological: She is alert. No cranial nerve deficit. Coordination abnormal.  Severe memory loss.  09/12/09 was 25/30.  MMSE 13/30 -- 11/2015   Skin: She is not diaphoretic.  Psychiatric:  Poor judgment. Impulsive. depression    Labs reviewed: Basic Metabolic Panel:  Recent Labs  10/09/15 1646  NA 140  K 4.1  CL 103  CO2 27  GLUCOSE 94  BUN 22  CREATININE 1.04*  CALCIUM 8.9  TSH 2.03   Liver Function Tests:  Recent Labs  10/09/15 1646  AST 20  ALT 13  ALKPHOS 86  BILITOT 0.5  PROT 6.7  ALBUMIN 4.0   No results for input(s): LIPASE, AMYLASE in the last 8760 hours. No results for input(s): AMMONIA in the last 8760 hours. CBC:  Recent Labs  10/09/15 1646  WBC 6.0  NEUTROABS  3,720  HGB 12.5  HCT 37.9  MCV 99.2  PLT 212   Lipid Panel: No results for input(s): CHOL, HDL, LDLCALC, TRIG, CHOLHDL, LDLDIRECT in the last 8760 hours. TSH:  Recent Labs  10/09/15 1646  TSH 2.03   A1C: Lab Results  Component Value Date   HGBA1C 5.3 08/30/2010     Assessment/Plan 1. Essential hypertension Stable at this time, conts on cozaar and norvasc as prescribed.  - CMP with eGFR  2. Primary osteoarthritis, unspecified site Ongoing knee pain however pt does not wish to have intervention at this time.   3. Constipation, unspecified constipation type -controlled on linzess 72 mcg  4. Alzheimer's disease of other onset with behavioral disturbance Advanced dementia, conts on namzaric, unfortunately she is losing weight due to disease progression. Family has liberalized diet and encouraged supplements but has been unsuccessful.   5. Loss of weight -due to progression of dementia, family offering food but pt with decrease in appetite. Possible increase in appetite with Effexor.   6. Severe episode of recurrent major depressive disorder, without psychotic features ([place]) -did not previously start effexor at last visit but would like to do this at this time due to  anxiety/depression  To stop cymbalta and start effexor - venlafaxine XR (EFFEXOR-XR) 75 MG 24 hr capsule; Take 1 capsule (75 mg total) by mouth daily with breakfast.  Dispense: 30 capsule; Refill: 1  7. Vitamin D deficiency -conts on vit D weekly, will follow up Vitamin D, 25-hydroxy  8. Frequent falls Discussed bone density and risk for fracture. Pt does not wish to have bone density or take calcium or vit D at this time.   Carlos American. Harle Battiest  [facility] [phone] 8 am - 5 pm) [phone] (after hours)

## 2016-03-03 NOTE — Patient Instructions (Signed)
STOP Cymbalta and START Venlafaxine 75 mg  Daily  Please notify your pharmacy of any medication that was stopped that is on automatic refill.

## 2016-03-04 ENCOUNTER — Other Ambulatory Visit: Payer: Self-pay

## 2016-03-04 LAB — VITAMIN D 25 HYDROXY (VIT D DEFICIENCY, FRACTURES): Vit D, 25-Hydroxy: 44 ng/mL (ref 30–100)

## 2016-03-04 NOTE — Telephone Encounter (Signed)
Per lab results for 03/04/15: Labs stable, cont current regimen. Vit D level is good, less decrease Vit D 50,000 units to 1 tablet every other week at this time.  I spoke with patient's daughter about the lab results and she verbalized understanding. She did not have any questions at this time.   Medication list has been updated.

## 2016-04-08 ENCOUNTER — Other Ambulatory Visit: Payer: Self-pay | Admitting: *Deleted

## 2016-04-08 DIAGNOSIS — F332 Major depressive disorder, recurrent severe without psychotic features: Secondary | ICD-10-CM

## 2016-04-08 MED ORDER — VENLAFAXINE HCL ER 75 MG PO CP24: ORAL_CAPSULE | ORAL | 1 refills | Status: DC

## 2016-04-08 NOTE — Telephone Encounter (Signed)
Daughter requested refill to be sent to pharmacy.  

## 2016-05-05 ENCOUNTER — Other Ambulatory Visit: Payer: Self-pay | Admitting: Internal Medicine

## 2016-05-05 ENCOUNTER — Other Ambulatory Visit: Payer: Self-pay | Admitting: Nurse Practitioner

## 2016-05-05 ENCOUNTER — Other Ambulatory Visit: Payer: Self-pay | Admitting: *Deleted

## 2016-05-05 DIAGNOSIS — I10 Essential (primary) hypertension: Secondary | ICD-10-CM

## 2016-05-05 DIAGNOSIS — F0280 Dementia in other diseases classified elsewhere without behavioral disturbance: Secondary | ICD-10-CM

## 2016-05-05 DIAGNOSIS — G301 Alzheimer's disease with late onset: Secondary | ICD-10-CM

## 2016-05-05 DIAGNOSIS — E039 Hypothyroidism, unspecified: Secondary | ICD-10-CM

## 2016-05-05 MED ORDER — LEVOTHYROXINE SODIUM 88 MCG PO TABS: ORAL_TABLET | ORAL | 3 refills | Status: DC

## 2016-05-05 MED ORDER — MEMANTINE HCL-DONEPEZIL HCL ER 28-10 MG PO CP24: 1.0000 | ORAL_CAPSULE | Freq: Every day | ORAL | 3 refills | Status: DC

## 2016-05-05 MED ORDER — VITAMIN D (ERGOCALCIFEROL) 1.25 MG (50000 UNIT) PO CAPS: ORAL_CAPSULE | ORAL | 3 refills | Status: DC

## 2016-05-05 MED ORDER — LOSARTAN POTASSIUM 100 MG PO TABS: ORAL_TABLET | ORAL | 3 refills | Status: DC

## 2016-05-05 NOTE — Telephone Encounter (Signed)
Patient daughter, [name], requested to be sent to [facility]. Faxed.

## 2016-05-09 ENCOUNTER — Telehealth: Payer: Self-pay

## 2016-05-09 NOTE — Telephone Encounter (Signed)
Returned her call to let her know that she has an upcoming appt on 05/13/2016.

## 2016-05-13 ENCOUNTER — Encounter: Payer: Self-pay | Admitting: Nurse Practitioner

## 2016-05-13 ENCOUNTER — Encounter (INDEPENDENT_AMBULATORY_CARE_PROVIDER_SITE_OTHER): Payer: Self-pay

## 2016-05-13 ENCOUNTER — Ambulatory Visit (INDEPENDENT_AMBULATORY_CARE_PROVIDER_SITE_OTHER): Payer: Medicare HMO | Admitting: Nurse Practitioner

## 2016-05-13 VITALS — BP 140/80 | HR 80 | Temp 98.3°F | Resp 16 | Ht 66.0 in | Wt 123.2 lb

## 2016-05-13 DIAGNOSIS — F332 Major depressive disorder, recurrent severe without psychotic features: Secondary | ICD-10-CM

## 2016-05-13 DIAGNOSIS — K5900 Constipation, unspecified: Secondary | ICD-10-CM

## 2016-05-13 DIAGNOSIS — R64 Cachexia: Secondary | ICD-10-CM

## 2016-05-13 DIAGNOSIS — I10 Essential (primary) hypertension: Secondary | ICD-10-CM | POA: Diagnosis not present

## 2016-05-13 DIAGNOSIS — F0281 Dementia in other diseases classified elsewhere with behavioral disturbance: Secondary | ICD-10-CM | POA: Diagnosis not present

## 2016-05-13 DIAGNOSIS — G308 Other Alzheimer's disease: Secondary | ICD-10-CM

## 2016-05-13 DIAGNOSIS — F02818 Dementia in other diseases classified elsewhere, unspecified severity, with other behavioral disturbance: Secondary | ICD-10-CM

## 2016-05-13 MED ORDER — MEGESTROL ACETATE 40 MG/ML PO SUSP: 400.0000 mg | Freq: Every day | ORAL | 2 refills | Status: DC

## 2016-05-13 MED ORDER — VITAMIN D (ERGOCALCIFEROL) 1.25 MG (50000 UNIT) PO CAPS: ORAL_CAPSULE | ORAL | 3 refills | Status: DC

## 2016-05-13 NOTE — Progress Notes (Signed)
Careteam: Patient Care Team: Estill Dooms, MD as PCP - General (Internal Medicine)  Advanced Directive information Does Patient Have a Medical Advance Directive?: Yes, Type of Advance Directive: Healthcare Power of Attorney  No Known Allergies  Chief Complaint  Patient presents with  . Medical Management of Chronic Issues    Routine follow-up   . Medication Refill    No refills needed at this time      HPI: Patient is a 83 y.o. female seen in the office today for follow up on mood. Pt was changed from Cymbalta to Effexor.  Mood has basically been unchanged, no worse.  Memory is worse. Eating less. Does not wish to eat just because. Decrease appetite. Remeron has been prescribed in 2014-2015 and she had weight loss despite medication. Would like to try megace.  Has occasional loose stools with linzess  Does not wish to go to AL, would like to stay home as long as possible. Review of Systems:  Review of Systems  Constitutional: Positive for activity change, appetite change and fatigue. Negative for chills and fever.  HENT: Positive for hearing loss. Negative for congestion, mouth sores, sinus pressure and sore throat.   Eyes: Negative.   Respiratory: Negative for cough, shortness of breath and wheezing.   Cardiovascular: Negative for chest pain, palpitations and leg swelling.  Gastrointestinal: Negative for abdominal distention, abdominal pain, constipation, diarrhea and nausea.  Endocrine: Negative.   Genitourinary: Negative.  Negative for dysuria.  Musculoskeletal: Negative for arthralgias, back pain and myalgias.       Unstable gait. Only 1 fall since last visit  Skin: Negative for rash.  Allergic/Immunologic: Negative.   Neurological: Negative for dizziness, tremors, weakness and headaches.       Poor balance. Demented.  Hematological: Negative.   Psychiatric/Behavioral: Positive for agitation, behavioral problems, confusion, decreased concentration, dysphoric mood  and sleep disturbance. The patient is nervous/anxious.        Brief episodes of agitation. Very poor memory.    Past Medical History:  Diagnosis Date  . Abnormality of gait   . Alzheimer's disease   . Calculus of kidney   . Closed fracture of seventh cervical vertebra without mention of spinal cord injury   . Depressive disorder, not elsewhere classified   . Edema   . Edema   . External hemorrhoids without mention of complication   . Gastric ulcer, unspecified as acute or chronic, without mention of hemorrhage, perforation, or obstruction   . GERD (gastroesophageal reflux disease)   . Hypertension   . Insomnia, unspecified   . Loss of weight   . Osteoarthrosis, unspecified whether generalized or localized, unspecified site   . Other protein-calorie malnutrition   . Pain in thoracic spine   . Personal history of fall   . Unspecified constipation   . Unspecified hereditary and idiopathic peripheral neuropathy   . Unspecified hypothyroidism   . Unspecified vitamin D deficiency   . Urinary frequency    Past Surgical History:  Procedure Laterality Date  . WRIST SURGERY Right 2013   Social History:   reports that she has never smoked. She has never used smokeless tobacco. She reports that she does not drink alcohol or use drugs.  Family History  Problem Relation Age of Onset  . Cancer Mother   . CVA Father   . Cancer Brother   . Alcohol abuse Sister     Medications: Patient's Medications  New Prescriptions   No medications on file  Previous  Medications   AMLODIPINE (NORVASC) 5 MG TABLET    TAKE 1 TABLET(5 MG) BY MOUTH DAILY   LEVOTHYROXINE (SYNTHROID, LEVOTHROID) 88 MCG TABLET    Take one tablet by mouth once daily 30 minutes before breakfast for thyroid   LINACLOTIDE (LINZESS) 72 MCG CAPSULE    Take 1 capsule (72 mcg total) by mouth daily before breakfast.   LOSARTAN (COZAAR) 100 MG TABLET    Take one tablet by mouth once daily to lower blood pressure   MEMANTINE  HCL-DONEPEZIL HCL (NAMZARIC) 28-10 MG CP24    Take 1 tablet by mouth at bedtime. to stabilize memory   VENLAFAXINE XR (EFFEXOR-XR) 75 MG 24 HR CAPSULE    Take one capsule by mouth once daily with breakfast   VITAMIN D, ERGOCALCIFEROL, (DRISDOL) 50000 UNITS CAPS CAPSULE    Take one capsule by mouth every 7 days  Modified Medications   No medications on file  Discontinued Medications   No medications on file     Physical Exam:  Vitals:   05/13/16 1528  BP: 140/80  Pulse: 80  Resp: 16  Temp: 98.3 F (36.8 C)  TempSrc: Oral  SpO2: 95%  Weight: 123 lb 3.2 oz (55.9 kg)  Height: 5\' 6"  (1.676 m)   Body mass index is 19.89 kg/m.  Physical Exam  Constitutional: No distress.  Thin. Frail. female  HENT:  Head: Normocephalic and atraumatic.  Eyes: Conjunctivae and EOM are normal. Pupils are equal, round, and reactive to light.  Neck: Normal range of motion. Neck supple.  Cardiovascular: Normal rate, regular rhythm and normal heart sounds.   2/6 LSB murmur. Diminshed pedal pulses.  Pulmonary/Chest: Effort normal and breath sounds normal.  Abdominal: Soft. Bowel sounds are normal. She exhibits no distension and no mass. There is no tenderness. There is no rebound.  Rounded abdomen, soft, nontender  Musculoskeletal: Normal range of motion. She exhibits edema.  Neurological: She is alert. No cranial nerve deficit. Coordination abnormal.  Severe memory loss.  09/12/09 was 25/30.  MMSE 13/30 -- 11/2015   Skin: She is not diaphoretic.  Psychiatric:  Poor judgment. Impulsive. depression    Labs reviewed: Basic Metabolic Panel:  Recent Labs  10/09/15 1646 03/03/16 1605  NA 140 142  K 4.1 4.5  CL 103 105  CO2 27 26  GLUCOSE 94 98  BUN 22 16  CREATININE 1.04* 0.91*  CALCIUM 8.9 9.6  TSH 2.03  --    Liver Function Tests:  Recent Labs  10/09/15 1646 03/03/16 1605  AST 20 18  ALT 13 10  ALKPHOS 86 71  BILITOT 0.5 0.7  PROT 6.7 6.9  ALBUMIN 4.0 4.3   No results for  input(s): LIPASE, AMYLASE in the last 8760 hours. No results for input(s): AMMONIA in the last 8760 hours. CBC:  Recent Labs  10/09/15 1646  WBC 6.0  NEUTROABS 3,720  HGB 12.5  HCT 37.9  MCV 99.2  PLT 212   Lipid Panel: No results for input(s): CHOL, HDL, LDLCALC, TRIG, CHOLHDL, LDLDIRECT in the last 8760 hours. TSH:  Recent Labs  10/09/15 1646  TSH 2.03   A1C: Lab Results  Component Value Date   HGBA1C 5.3 08/30/2010     Assessment/Plan 1. Cachexia ([place]) -significant weight loss noted.  13 lbs in 5 months. Has failed Remeron in the past, daughter would like to try megace despite side effect profile that was reviewed with pt and daughter at visit. anticipated further decline with progression of dementia but will try  megace to stimulate appetite.  - megestrol (MEGACE) 40 MG/ML suspension; Take 10 mLs (400 mg total) by mouth daily.  Dispense: 240 mL; Refill: 2  2. Constipation, unspecified constipation type Controlled with linzess, may use every other day if having loose stools.   3. Essential hypertension Blood pressure controlled on current regimen.   4. Alzheimer's disease of other onset with behavioral disturbance Worsening memory loss, still lives at home alone. Daughter checks in on her frequently. conts on namzaric daily  5. Severe episode of recurrent major depressive disorder, without psychotic features ([place]) -mood has been stable, no increase in depression or anxiety, will cont  effexor  at this time.    Carlos American. Harle Battiest  [facility] [phone] 8 am - 5 pm) [phone] (after hours)

## 2016-05-19 ENCOUNTER — Telehealth: Payer: Self-pay

## 2016-05-19 NOTE — Telephone Encounter (Signed)
Left message for patient to return call.

## 2016-05-19 NOTE — Telephone Encounter (Signed)
Okay to start this process

## 2016-05-19 NOTE — Telephone Encounter (Signed)
To encourage foods high in protein and nutritional value, ensures twice daily between meals not as meal replacements, make meals more social.

## 2016-05-19 NOTE — Telephone Encounter (Signed)
Spoke with [name] she stated that she called [place], they told her that the provider could apply for an appeal and she said she would like that done.

## 2016-05-19 NOTE — Telephone Encounter (Signed)
Spoke with patient regarding [name] recommendations. She expressed understanding, patient also stated that patient is not eating that's why she wanted to try this medication, is there another medication she can take.

## 2016-05-19 NOTE — Telephone Encounter (Signed)
Coverage has been denied for megestrol, patient wants to know what to do next.  Member Number [id number]  No medications are listed to take instead.

## 2016-05-19 NOTE — Telephone Encounter (Signed)
We already discussed Remeron at her previous appt (has taken in past, no effective, did not want to give her anything that could make her more drowsy), we have already changed her antidepressant around and there was no improvement with that either.

## 2016-05-21 NOTE — Telephone Encounter (Signed)
Appeal paperwork filled out by [name] and faxed to [facility] Fax: [phone]. Awaiting determination.

## 2016-05-23 NOTE — Telephone Encounter (Signed)
Appeal DENIED by [facility] for Megestrol Acetate. Stated "none of the additional information submitted changes the original decision. Paperwork placed in [place] folder to review. Member ID: [id number] Ref #: [id number]

## 2016-05-27 NOTE — Telephone Encounter (Signed)
Daughter Notified. Denial letter sent for scanning.

## 2016-06-09 ENCOUNTER — Other Ambulatory Visit: Payer: Self-pay | Admitting: Nurse Practitioner

## 2016-06-09 DIAGNOSIS — I10 Essential (primary) hypertension: Secondary | ICD-10-CM

## 2016-07-02 ENCOUNTER — Telehealth: Payer: Self-pay | Admitting: *Deleted

## 2016-07-02 MED ORDER — LUBIPROSTONE 24 MCG PO CAPS: 24.0000 ug | ORAL_CAPSULE | Freq: Two times a day (BID) | ORAL | 1 refills | Status: DC

## 2016-07-02 NOTE — Telephone Encounter (Signed)
Patient daughter notified and agreed. Medication list updated and Rx faxed to pharmacy.

## 2016-07-02 NOTE — Telephone Encounter (Signed)
This is the lowest dose. We can try another medication if they would like to try amitiza 24 mcg by mouth twice daily with food (could start with once daily and then increase to twice daily if no diarrhea)

## 2016-07-02 NOTE — Telephone Encounter (Signed)
LMOM to return call.

## 2016-07-02 NOTE — Telephone Encounter (Signed)
Patient daughter called and stated that patient is taking Linzess 6mcg and it gives her some diarrhea but if she doesn't take it she is constipated. Daughter wonders if the dosage on the Linzess can be decreased to see if it will help decrease the diarrhea. Please Advise.

## 2016-07-03 ENCOUNTER — Telehealth: Payer: Self-pay

## 2016-07-03 NOTE — Telephone Encounter (Signed)
Received prior authorization on Amitiza 24mg  #60. Called [phone]. They will fax form .

## 2016-07-03 NOTE — Telephone Encounter (Signed)
Form was received from [facility], completed to best of knowledge and forwarded to Sherrie Mustache, NP for additional completion and signature.  Form will be faxed to [phone] once completed

## 2016-07-04 ENCOUNTER — Telehealth: Payer: Self-pay | Admitting: *Deleted

## 2016-07-04 DIAGNOSIS — R64 Cachexia: Secondary | ICD-10-CM

## 2016-07-04 MED ORDER — MEGESTROL ACETATE 40 MG/ML PO SUSP: 400.0000 mg | Freq: Every day | ORAL | 2 refills | Status: DC

## 2016-07-04 NOTE — Telephone Encounter (Signed)
Patient daughter notified. Left Voicemail. Called Rx to [facility].

## 2016-07-04 NOTE — Telephone Encounter (Signed)
Patient daughter notified and stated that she will stick with the liquid.

## 2016-07-04 NOTE — Telephone Encounter (Signed)
The dose is 400 mg-- she would have to take 10 pills a day to equal that.

## 2016-07-04 NOTE — Telephone Encounter (Signed)
Message left on clinical intake voicemail:  Patient's daughter called [facility] and they have a 40 mg tablet available please advise

## 2016-07-04 NOTE — Telephone Encounter (Signed)
There is no tablet form, okay to refill liquid medication, make sure she has a follow up in place

## 2016-07-04 NOTE — Telephone Encounter (Signed)
Patient daughter, [name] called and stated that patient is taking liquid megestrol and she sees it is helping patient. Daughter wants the tablet form called into pharmacy. Knows it was Denied by insurance but she Stated that she will pay for it out of pocket. Please Advise.

## 2016-07-07 NOTE — Telephone Encounter (Signed)
Received fax from [place] 68mcg was APPROVED until 07/04/2017.

## 2016-10-13 ENCOUNTER — Other Ambulatory Visit: Payer: Self-pay | Admitting: Internal Medicine

## 2016-10-13 DIAGNOSIS — F332 Major depressive disorder, recurrent severe without psychotic features: Secondary | ICD-10-CM

## 2016-11-10 ENCOUNTER — Encounter (HOSPITAL_COMMUNITY): Payer: Self-pay

## 2016-11-10 ENCOUNTER — Emergency Department (HOSPITAL_COMMUNITY): Payer: Medicare HMO

## 2016-11-10 ENCOUNTER — Inpatient Hospital Stay (HOSPITAL_COMMUNITY)
Admission: EM | Admit: 2016-11-10 | Discharge: 2016-11-14 | DRG: 070 | Disposition: A | Discharge status: Skilled Nursing Facility | Payer: Medicare HMO | Attending: Internal Medicine | Admitting: Internal Medicine

## 2016-11-10 DIAGNOSIS — I351 Nonrheumatic aortic (valve) insufficiency: Secondary | ICD-10-CM | POA: Diagnosis not present

## 2016-11-10 DIAGNOSIS — F0150 Vascular dementia without behavioral disturbance: Secondary | ICD-10-CM

## 2016-11-10 DIAGNOSIS — M6281 Muscle weakness (generalized): Secondary | ICD-10-CM | POA: Diagnosis not present

## 2016-11-10 DIAGNOSIS — E785 Hyperlipidemia, unspecified: Secondary | ICD-10-CM | POA: Diagnosis present

## 2016-11-10 DIAGNOSIS — Z6821 Body mass index (BMI) 21.0-21.9, adult: Secondary | ICD-10-CM | POA: Diagnosis not present

## 2016-11-10 DIAGNOSIS — G3189 Other specified degenerative diseases of nervous system: Secondary | ICD-10-CM | POA: Diagnosis present

## 2016-11-10 DIAGNOSIS — R4781 Slurred speech: Secondary | ICD-10-CM | POA: Diagnosis not present

## 2016-11-10 DIAGNOSIS — G9341 Metabolic encephalopathy: Secondary | ICD-10-CM | POA: Diagnosis not present

## 2016-11-10 DIAGNOSIS — R9082 White matter disease, unspecified: Secondary | ICD-10-CM | POA: Diagnosis present

## 2016-11-10 DIAGNOSIS — S0990XA Unspecified injury of head, initial encounter: Secondary | ICD-10-CM | POA: Diagnosis present

## 2016-11-10 DIAGNOSIS — N289 Disorder of kidney and ureter, unspecified: Secondary | ICD-10-CM | POA: Diagnosis present

## 2016-11-10 DIAGNOSIS — R4182 Altered mental status, unspecified: Secondary | ICD-10-CM | POA: Diagnosis not present

## 2016-11-10 DIAGNOSIS — R41841 Cognitive communication deficit: Secondary | ICD-10-CM | POA: Diagnosis not present

## 2016-11-10 DIAGNOSIS — S0003XA Contusion of scalp, initial encounter: Secondary | ICD-10-CM | POA: Diagnosis not present

## 2016-11-10 DIAGNOSIS — R2689 Other abnormalities of gait and mobility: Secondary | ICD-10-CM | POA: Diagnosis not present

## 2016-11-10 DIAGNOSIS — R627 Adult failure to thrive: Secondary | ICD-10-CM | POA: Diagnosis present

## 2016-11-10 DIAGNOSIS — I700 Atherosclerosis of aorta: Secondary | ICD-10-CM | POA: Diagnosis present

## 2016-11-10 DIAGNOSIS — S199XXA Unspecified injury of neck, initial encounter: Secondary | ICD-10-CM | POA: Diagnosis not present

## 2016-11-10 DIAGNOSIS — G301 Alzheimer's disease with late onset: Secondary | ICD-10-CM | POA: Diagnosis present

## 2016-11-10 DIAGNOSIS — Z79890 Hormone replacement therapy (postmenopausal): Secondary | ICD-10-CM

## 2016-11-10 DIAGNOSIS — E876 Hypokalemia: Secondary | ICD-10-CM | POA: Diagnosis not present

## 2016-11-10 DIAGNOSIS — F05 Delirium due to known physiological condition: Secondary | ICD-10-CM | POA: Diagnosis present

## 2016-11-10 DIAGNOSIS — Y939 Activity, unspecified: Secondary | ICD-10-CM | POA: Diagnosis not present

## 2016-11-10 DIAGNOSIS — E46 Unspecified protein-calorie malnutrition: Secondary | ICD-10-CM | POA: Diagnosis not present

## 2016-11-10 DIAGNOSIS — R471 Dysarthria and anarthria: Secondary | ICD-10-CM | POA: Diagnosis present

## 2016-11-10 DIAGNOSIS — S299XXA Unspecified injury of thorax, initial encounter: Secondary | ICD-10-CM | POA: Diagnosis not present

## 2016-11-10 DIAGNOSIS — R278 Other lack of coordination: Secondary | ICD-10-CM | POA: Diagnosis not present

## 2016-11-10 DIAGNOSIS — Y92009 Unspecified place in unspecified non-institutional (private) residence as the place of occurrence of the external cause: Secondary | ICD-10-CM

## 2016-11-10 DIAGNOSIS — W19XXXA Unspecified fall, initial encounter: Secondary | ICD-10-CM | POA: Diagnosis present

## 2016-11-10 DIAGNOSIS — G9340 Encephalopathy, unspecified: Secondary | ICD-10-CM | POA: Diagnosis not present

## 2016-11-10 DIAGNOSIS — E039 Hypothyroidism, unspecified: Secondary | ICD-10-CM | POA: Diagnosis present

## 2016-11-10 DIAGNOSIS — W01198A Fall on same level from slipping, tripping and stumbling with subsequent striking against other object, initial encounter: Secondary | ICD-10-CM | POA: Diagnosis not present

## 2016-11-10 DIAGNOSIS — Z7982 Long term (current) use of aspirin: Secondary | ICD-10-CM | POA: Diagnosis not present

## 2016-11-10 DIAGNOSIS — D7589 Other specified diseases of blood and blood-forming organs: Secondary | ICD-10-CM | POA: Diagnosis present

## 2016-11-10 DIAGNOSIS — J9811 Atelectasis: Secondary | ICD-10-CM | POA: Diagnosis not present

## 2016-11-10 DIAGNOSIS — R9431 Abnormal electrocardiogram [ECG] [EKG]: Secondary | ICD-10-CM | POA: Diagnosis not present

## 2016-11-10 DIAGNOSIS — R296 Repeated falls: Secondary | ICD-10-CM | POA: Diagnosis not present

## 2016-11-10 DIAGNOSIS — S098XXA Other specified injuries of head, initial encounter: Secondary | ICD-10-CM | POA: Diagnosis not present

## 2016-11-10 DIAGNOSIS — F329 Major depressive disorder, single episode, unspecified: Secondary | ICD-10-CM | POA: Diagnosis not present

## 2016-11-10 DIAGNOSIS — I639 Cerebral infarction, unspecified: Secondary | ICD-10-CM | POA: Diagnosis not present

## 2016-11-10 DIAGNOSIS — K219 Gastro-esophageal reflux disease without esophagitis: Secondary | ICD-10-CM | POA: Diagnosis present

## 2016-11-10 DIAGNOSIS — Z823 Family history of stroke: Secondary | ICD-10-CM

## 2016-11-10 DIAGNOSIS — E559 Vitamin D deficiency, unspecified: Secondary | ICD-10-CM | POA: Diagnosis present

## 2016-11-10 DIAGNOSIS — E860 Dehydration: Secondary | ICD-10-CM | POA: Diagnosis present

## 2016-11-10 DIAGNOSIS — Z87442 Personal history of urinary calculi: Secondary | ICD-10-CM

## 2016-11-10 DIAGNOSIS — F0391 Unspecified dementia with behavioral disturbance: Secondary | ICD-10-CM | POA: Diagnosis not present

## 2016-11-10 DIAGNOSIS — I10 Essential (primary) hypertension: Secondary | ICD-10-CM | POA: Diagnosis not present

## 2016-11-10 DIAGNOSIS — S064X0A Epidural hemorrhage without loss of consciousness, initial encounter: Secondary | ICD-10-CM | POA: Diagnosis not present

## 2016-11-10 DIAGNOSIS — Z79899 Other long term (current) drug therapy: Secondary | ICD-10-CM

## 2016-11-10 DIAGNOSIS — S41111A Laceration without foreign body of right upper arm, initial encounter: Secondary | ICD-10-CM | POA: Diagnosis present

## 2016-11-10 DIAGNOSIS — I340 Nonrheumatic mitral (valve) insufficiency: Secondary | ICD-10-CM | POA: Diagnosis not present

## 2016-11-10 DIAGNOSIS — E43 Unspecified severe protein-calorie malnutrition: Secondary | ICD-10-CM | POA: Diagnosis not present

## 2016-11-10 DIAGNOSIS — G309 Alzheimer's disease, unspecified: Secondary | ICD-10-CM | POA: Diagnosis not present

## 2016-11-10 DIAGNOSIS — R17 Unspecified jaundice: Secondary | ICD-10-CM | POA: Diagnosis not present

## 2016-11-10 DIAGNOSIS — F0280 Dementia in other diseases classified elsewhere without behavioral disturbance: Secondary | ICD-10-CM | POA: Diagnosis present

## 2016-11-10 DIAGNOSIS — I739 Peripheral vascular disease, unspecified: Secondary | ICD-10-CM | POA: Diagnosis not present

## 2016-11-10 DIAGNOSIS — D329 Benign neoplasm of meninges, unspecified: Secondary | ICD-10-CM | POA: Diagnosis present

## 2016-11-10 DIAGNOSIS — Z66 Do not resuscitate: Secondary | ICD-10-CM | POA: Diagnosis present

## 2016-11-10 DIAGNOSIS — S0181XA Laceration without foreign body of other part of head, initial encounter: Secondary | ICD-10-CM | POA: Diagnosis not present

## 2016-11-10 DIAGNOSIS — K5900 Constipation, unspecified: Secondary | ICD-10-CM | POA: Diagnosis present

## 2016-11-10 DIAGNOSIS — Y92129 Unspecified place in nursing home as the place of occurrence of the external cause: Secondary | ICD-10-CM | POA: Diagnosis not present

## 2016-11-10 DIAGNOSIS — F332 Major depressive disorder, recurrent severe without psychotic features: Secondary | ICD-10-CM | POA: Diagnosis present

## 2016-11-10 DIAGNOSIS — Z811 Family history of alcohol abuse and dependence: Secondary | ICD-10-CM

## 2016-11-10 DIAGNOSIS — Y999 Unspecified external cause status: Secondary | ICD-10-CM | POA: Diagnosis not present

## 2016-11-10 DIAGNOSIS — E7800 Pure hypercholesterolemia, unspecified: Secondary | ICD-10-CM | POA: Diagnosis present

## 2016-11-10 LAB — URINALYSIS, ROUTINE W REFLEX MICROSCOPIC
Bacteria, UA: NONE SEEN
Bilirubin Urine: NEGATIVE
Glucose, UA: NEGATIVE mg/dL
Ketones, ur: NEGATIVE mg/dL
Leukocytes, UA: NEGATIVE
Nitrite: NEGATIVE
Protein, ur: NEGATIVE mg/dL
Specific Gravity, Urine: 1.015 (ref 1.005–1.030)
Squamous Epithelial / LPF: NONE SEEN
pH: 6 (ref 5.0–8.0)

## 2016-11-10 LAB — CBC WITH DIFFERENTIAL/PLATELET
Basophils Absolute: 0 10*3/uL (ref 0.0–0.1)
Basophils Relative: 0 %
Eosinophils Absolute: 0.1 10*3/uL (ref 0.0–0.7)
Eosinophils Relative: 2 %
HCT: 34.6 % — ABNORMAL LOW (ref 36.0–46.0)
Hemoglobin: 12.2 g/dL (ref 12.0–15.0)
Lymphocytes Relative: 22 %
Lymphs Abs: 1.5 10*3/uL (ref 0.7–4.0)
MCH: 36 pg — ABNORMAL HIGH (ref 26.0–34.0)
MCHC: 35.3 g/dL (ref 30.0–36.0)
MCV: 102.1 fL — ABNORMAL HIGH (ref 78.0–100.0)
Monocytes Absolute: 0.5 10*3/uL (ref 0.1–1.0)
Monocytes Relative: 7 %
Neutro Abs: 4.8 10*3/uL (ref 1.7–7.7)
Neutrophils Relative %: 69 %
Platelets: 204 10*3/uL (ref 150–400)
RBC: 3.39 MIL/uL — ABNORMAL LOW (ref 3.87–5.11)
RDW: 12.5 % (ref 11.5–15.5)
WBC: 7 10*3/uL (ref 4.0–10.5)

## 2016-11-10 LAB — CBC
HCT: 36 % (ref 36.0–46.0)
Hemoglobin: 12.2 g/dL (ref 12.0–15.0)
MCH: 34.7 pg — ABNORMAL HIGH (ref 26.0–34.0)
MCHC: 33.9 g/dL (ref 30.0–36.0)
MCV: 102.3 fL — ABNORMAL HIGH (ref 78.0–100.0)
Platelets: 206 10*3/uL (ref 150–400)
RBC: 3.52 MIL/uL — ABNORMAL LOW (ref 3.87–5.11)
RDW: 12.3 % (ref 11.5–15.5)
WBC: 7.9 10*3/uL (ref 4.0–10.5)

## 2016-11-10 LAB — COMPREHENSIVE METABOLIC PANEL
ALT: 16 U/L (ref 14–54)
AST: 27 U/L (ref 15–41)
Albumin: 3.9 g/dL (ref 3.5–5.0)
Alkaline Phosphatase: 64 U/L (ref 38–126)
Anion gap: 9 (ref 5–15)
BUN: 19 mg/dL (ref 6–20)
CO2: 24 mmol/L (ref 22–32)
Calcium: 8.8 mg/dL — ABNORMAL LOW (ref 8.9–10.3)
Chloride: 105 mmol/L (ref 101–111)
Creatinine, Ser: 0.91 mg/dL (ref 0.44–1.00)
GFR calc Af Amer: >60 mL/min (ref >60)
GFR calc non Af Amer: 57 mL/min — ABNORMAL LOW (ref >60)
Glucose, Bld: 96 mg/dL (ref 65–99)
Potassium: 3.2 mmol/L — ABNORMAL LOW (ref 3.5–5.1)
Sodium: 138 mmol/L (ref 135–145)
Total Bilirubin: 1.3 mg/dL — ABNORMAL HIGH (ref 0.3–1.2)
Total Protein: 6.6 g/dL (ref 6.5–8.1)

## 2016-11-10 LAB — PROTIME-INR
INR: 1.07
Prothrombin Time: 13.8 seconds (ref 11.4–15.2)

## 2016-11-10 LAB — CREATININE, SERUM
Creatinine, Ser: 1.09 mg/dL — ABNORMAL HIGH (ref 0.44–1.00)
GFR calc Af Amer: 53 mL/min — ABNORMAL LOW (ref >60)
GFR calc non Af Amer: 46 mL/min — ABNORMAL LOW (ref >60)

## 2016-11-10 LAB — MAGNESIUM: Magnesium: 1.9 mg/dL (ref 1.7–2.4)

## 2016-11-10 LAB — TSH: TSH: 5.808 u[IU]/mL — ABNORMAL HIGH (ref 0.350–4.500)

## 2016-11-10 LAB — LIPASE, BLOOD: Lipase: 28 U/L (ref 11–51)

## 2016-11-10 LAB — I-STAT CG4 LACTIC ACID, ED: Lactic Acid, Venous: 0.88 mmol/L (ref 0.5–1.9)

## 2016-11-10 MED ORDER — HYDRALAZINE HCL 20 MG/ML IJ SOLN
5.0000 mg | Freq: Four times a day (QID) | INTRAMUSCULAR | Status: DC | PRN
  Administered 2016-11-13: 5 mg via INTRAVENOUS

## 2016-11-10 MED ORDER — AMITRIPTYLINE HCL 25 MG PO TABS
50.0000 mg | ORAL_TABLET | Freq: Every day | ORAL | Status: DC
  Administered 2016-11-10 – 2016-11-13 (×4): 50 mg via ORAL
  Filled 2016-11-10 (×4): qty 2

## 2016-11-10 MED ORDER — STROKE: EARLY STAGES OF RECOVERY BOOK
Freq: Once | Status: AC
Start: 2016-11-10 — End: 2016-11-11
  Administered 2016-11-11: 01:00:00

## 2016-11-10 MED ORDER — SENNOSIDES-DOCUSATE SODIUM 8.6-50 MG PO TABS
1.0000 | ORAL_TABLET | Freq: Every day | ORAL | Status: DC
  Administered 2016-11-10 – 2016-11-13 (×4): 1 via ORAL
  Filled 2016-11-10 (×5): qty 1

## 2016-11-10 MED ORDER — SODIUM CHLORIDE 0.9 % IV BOLUS (SEPSIS)
500.0000 mL | Freq: Once | INTRAVENOUS | Status: AC
  Administered 2016-11-10: 500 mL via INTRAVENOUS

## 2016-11-10 MED ORDER — ENOXAPARIN SODIUM 40 MG/0.4ML ~~LOC~~ SOLN
40.0000 mg | SUBCUTANEOUS | Status: DC
  Administered 2016-11-10 – 2016-11-13 (×4): 40 mg via SUBCUTANEOUS
  Filled 2016-11-10 (×4): qty 0.4

## 2016-11-10 MED ORDER — MEMANTINE HCL ER 28 MG PO CP24
28.0000 mg | ORAL_CAPSULE | Freq: Every day | ORAL | Status: DC
  Administered 2016-11-11 – 2016-11-12 (×3): 28 mg via ORAL
  Filled 2016-11-10 (×6): qty 1

## 2016-11-10 MED ORDER — ASPIRIN EC 81 MG PO TBEC
81.0000 mg | DELAYED_RELEASE_TABLET | Freq: Every day | ORAL | Status: DC
  Administered 2016-11-11 – 2016-11-14 (×3): 81 mg via ORAL
  Filled 2016-11-10 (×3): qty 1

## 2016-11-10 MED ORDER — DONEPEZIL HCL 10 MG PO TABS
10.0000 mg | ORAL_TABLET | Freq: Every day | ORAL | Status: DC
  Administered 2016-11-10 – 2016-11-13 (×4): 10 mg via ORAL
  Filled 2016-11-10 (×4): qty 1

## 2016-11-10 MED ORDER — MEMANTINE HCL-DONEPEZIL HCL ER 28-10 MG PO CP24: 1.0000 | ORAL_CAPSULE | Freq: Every day | ORAL | Status: DC

## 2016-11-10 MED ORDER — POTASSIUM CHLORIDE 20 MEQ/15ML (10%) PO SOLN: 40.0000 meq | Freq: Once | ORAL | Status: DC

## 2016-11-10 MED ORDER — POTASSIUM CHLORIDE 20 MEQ PO PACK: 40.0000 meq | PACK | Freq: Once | ORAL | Status: DC

## 2016-11-10 MED ORDER — VENLAFAXINE HCL ER 75 MG PO CP24
75.0000 mg | ORAL_CAPSULE | Freq: Every day | ORAL | Status: DC
  Administered 2016-11-11 – 2016-11-14 (×3): 75 mg via ORAL
  Filled 2016-11-10 (×4): qty 1

## 2016-11-10 MED ORDER — SODIUM CHLORIDE 0.9 % IV SOLN
INTRAVENOUS | Status: AC
  Administered 2016-11-11: 23:00:00 via INTRAVENOUS

## 2016-11-10 NOTE — H&P (Signed)
History and Physical  MAURISHA MONGEAU VOH:[id number] DOB: 12/28/1931 DOA: 11/10/2016  Referring physician: EDP PCP: [name], NP   Chief Complaint: slurred speech and falls  HPI: [name] is a 83 y.o. female  With h/o dementia (per daughter diagnosed 4-27yrs ago), depression, HTN is brought to [facility] ED due to above complaints.   ED course: vital stable. CT head/cervical spine no acute findings. Basic labs showed macrocytosis mcv 102, hypokalemia  k 3.2 , mild elevated bilirubin at 1.3, elevated tsh at 5.8, otherwise unremarkable. ua no infection. cxr no acute findings. EKG sinus rhythm, QTC 526. EDP talked to neurology Dr Reeves Forth who recommended patient admitted to hospitalist service and transfer to [facility] for further stroke work up.   Patient is oriented to person only, daughter at bedside helped with the history. Per daughter patient recently went to [place] for two weeks, returned to [facility] Thursday. Patient has not been taking her home meds regularly while she is in [place] visiting. On Friday am , patient had some nausea, and one episode of diarrhea. patient has multiple falls since Friday and her speech is slurred which is not her baseline. No fever, no cough. No abdominal pain. Daughter is not sure if Patient has had bm since Friday.  Per chart review, there is h/o constipation.   Review of Systems:  Detail per HPI, Review of systems are otherwise negative  Past Medical History:  Diagnosis Date  . Abnormality of gait   . Alzheimer's disease   . Calculus of kidney   . Closed fracture of seventh cervical vertebra without mention of spinal cord injury   . Depressive disorder, not elsewhere classified   . Edema   . Edema   . External hemorrhoids without mention of complication   . Gastric ulcer, unspecified as acute or chronic, without mention of hemorrhage, perforation, or obstruction   . GERD (gastroesophageal reflux disease)   . Hypertension   . Insomnia,  unspecified   . Loss of weight   . Osteoarthrosis, unspecified whether generalized or localized, unspecified site   . Other protein-calorie malnutrition   . Pain in thoracic spine   . Personal history of fall   . Unspecified constipation   . Unspecified hereditary and idiopathic peripheral neuropathy   . Unspecified hypothyroidism   . Unspecified vitamin D deficiency   . Urinary frequency    Past Surgical History:  Procedure Laterality Date  . WRIST SURGERY Right 2013   Social History:  reports that she has never smoked. She has never used smokeless tobacco. She reports that she does not drink alcohol or use drugs. Patient lives at home with family & is able to walk around in the house.  No Known Allergies  Family History  Problem Relation Age of Onset  . Cancer Mother   . CVA Father   . Cancer Brother   . Alcohol abuse Sister       Prior to Admission medications   Medication Sig Start Date End Date Taking? Authorizing Provider  amLODipine (NORVASC) 5 MG tablet TAKE 1 TABLET(5 MG) BY MOUTH DAILY 06/10/16   Estill Dooms, MD  levothyroxine (SYNTHROID, LEVOTHROID) 88 MCG tablet Take one tablet by mouth once daily 30 minutes before breakfast for thyroid 05/05/16   Estill Dooms, MD  losartan (COZAAR) 100 MG tablet Take one tablet by mouth once daily to lower blood pressure 05/05/16   Estill Dooms, MD  lubiprostone (AMITIZA) 24 MCG capsule Take 1 capsule (  24 mcg total) by mouth 2 (two) times daily with a meal. 07/02/16   [name], NP  megestrol (MEGACE) 40 MG/ML suspension Take 10 mLs (400 mg total) by mouth daily. 07/04/16   [name], NP  Memantine HCl-Donepezil HCl (NAMZARIC) 28-10 MG CP24 Take 1 tablet by mouth at bedtime. to stabilize memory 05/05/16   Estill Dooms, MD  venlafaxine XR (EFFEXOR-XR) 75 MG 24 hr capsule TAKE 1 CAPSULE BY MOUTH EVERY DAY WITH BREAKFAST 10/13/16   [name], NP  Vitamin D, Ergocalciferol, (DRISDOL) 50000 units CAPS  capsule Take one capsule by mouth every 14 days 05/13/16   [name], NP    Physical Exam: BP (!) 159/98   Pulse 78   Temp (!) 97.5 F (36.4 C) (Oral)   Resp 18   SpO2 97%   General:  Frail and thin elderly female, only oriented to person, calm and  NAD. Eyes: PERRL ENT: unremarkable Neck: supple, no JVD Cardiovascular: RRR Respiratory: CTABL Abdomen: soft/ND/ND, positive bowel sounds Skin: no rash Musculoskeletal:  No edema Psychiatric: calm/cooperative Neurologic: no focal findings  , only oriented to person.          Labs on Admission:  Basic Metabolic Panel:  Recent Labs Lab 11/10/16 1309  NA 138  K 3.2*  CL 105  CO2 24  GLUCOSE 96  BUN 19  CREATININE 0.91  CALCIUM 8.8*  MG 1.9   Liver Function Tests:  Recent Labs Lab 11/10/16 1309  AST 27  ALT 16  ALKPHOS 64  BILITOT 1.3*  PROT 6.6  ALBUMIN 3.9    Recent Labs Lab 11/10/16 1309  LIPASE 28   No results for input(s): AMMONIA in the last 168 hours. CBC:  Recent Labs Lab 11/10/16 1309  WBC 7.0  NEUTROABS 4.8  HGB 12.2  HCT 34.6*  MCV 102.1*  PLT 204   Cardiac Enzymes: No results for input(s): CKTOTAL, CKMB, CKMBINDEX, TROPONINI in the last 168 hours.  BNP (last 3 results) No results for input(s): BNP in the last 8760 hours.  ProBNP (last 3 results) No results for input(s): PROBNP in the last 8760 hours.  CBG: No results for input(s): GLUCAP in the last 168 hours.  Radiological Exams on Admission: Dg Chest 2 View  Result Date: 11/10/2016 CLINICAL DATA:  Fatigue, falls, altered mental status. EXAM: CHEST  2 VIEW COMPARISON:  Chest x-ray dated February 16, 2013 FINDINGS: The lungs are adequately inflated. The interstitial markings are coarse. There are increased lung markings at the left base likely in the lingula. The heart is normal in size. The pulmonary vascularity is not engorged. The lung markings are increased in the left upper lobe. There is calcification in the wall  of the aortic arch. The bony thorax exhibits no acute abnormality. There is a high-grade compression fracture of L1. IMPRESSION: Chronic bronchitic changes. Subsegmental atelectasis in the lingula. No alveolar pneumonia nor CHF. Thoracic aortic atherosclerosis. High-grade L1 compression fracture which which is unchanged since an abdominal CT scan of July 20, 2013. Electronically Signed   By: David  Martinique M.D.   On: 11/10/2016 15:04   Ct Head Wo Contrast  Result Date: 11/10/2016 CLINICAL DATA:  Head injury after fall at home. EXAM: CT HEAD WITHOUT CONTRAST CT CERVICAL SPINE WITHOUT CONTRAST TECHNIQUE: Multidetector CT imaging of the head and cervical spine was performed following the standard protocol without intravenous contrast. Multiplanar CT image reconstructions of the cervical spine were also generated. COMPARISON:  CT scan  of December 19, 2014. FINDINGS: CT HEAD FINDINGS Brain: Moderate chronic ischemic white matter disease is noted. Minimal diffuse cortical atrophy is noted. Stable calcified meningioma is noted in left posterior fossa. There is no evidence of hemorrhage or acute infarction. No mass effect or midline shift is noted. Vascular: No hyperdense vessel or unexpected calcification. Skull: Normal. Negative for fracture or focal lesion. Sinuses/Orbits: No acute finding. Other: None. CT CERVICAL SPINE FINDINGS Alignment: Normal. Skull base and vertebrae: No acute fracture. No primary bone lesion or focal pathologic process. Soft tissues and spinal canal: No prevertebral fluid or swelling. No visible canal hematoma. Disc levels: Severe degenerative disc disease is noted at C3-4 and C5-6 with anterior osteophyte formation. Upper chest: Negative. Other: Levoscoliosis of cervical spine is noted. Degenerative changes seen involving the right-sided posterior facet joints. IMPRESSION: Moderate chronic ischemic white matter disease. Minimal diffuse cortical atrophy. Stable calcified meningioma seen in left  posterior fossa. No acute intracranial abnormality seen. Severe multilevel degenerative disc disease. No acute abnormality seen in the cervical spine. Electronically Signed   By: Marijo Conception, M.D.   On: 11/10/2016 13:51   Ct Cervical Spine Wo Contrast  Result Date: 11/10/2016 CLINICAL DATA:  Head injury after fall at home. EXAM: CT HEAD WITHOUT CONTRAST CT CERVICAL SPINE WITHOUT CONTRAST TECHNIQUE: Multidetector CT imaging of the head and cervical spine was performed following the standard protocol without intravenous contrast. Multiplanar CT image reconstructions of the cervical spine were also generated. COMPARISON:  CT scan of December 19, 2014. FINDINGS: CT HEAD FINDINGS Brain: Moderate chronic ischemic white matter disease is noted. Minimal diffuse cortical atrophy is noted. Stable calcified meningioma is noted in left posterior fossa. There is no evidence of hemorrhage or acute infarction. No mass effect or midline shift is noted. Vascular: No hyperdense vessel or unexpected calcification. Skull: Normal. Negative for fracture or focal lesion. Sinuses/Orbits: No acute finding. Other: None. CT CERVICAL SPINE FINDINGS Alignment: Normal. Skull base and vertebrae: No acute fracture. No primary bone lesion or focal pathologic process. Soft tissues and spinal canal: No prevertebral fluid or swelling. No visible canal hematoma. Disc levels: Severe degenerative disc disease is noted at C3-4 and C5-6 with anterior osteophyte formation. Upper chest: Negative. Other: Levoscoliosis of cervical spine is noted. Degenerative changes seen involving the right-sided posterior facet joints. IMPRESSION: Moderate chronic ischemic white matter disease. Minimal diffuse cortical atrophy. Stable calcified meningioma seen in left posterior fossa. No acute intracranial abnormality seen. Severe multilevel degenerative disc disease. No acute abnormality seen in the cervical spine. Electronically Signed   By: Marijo Conception, M.D.    On: 11/10/2016 13:51     Assessment/Plan Present on Admission: . Slurred speech  Slurred speech/falls: CT head with chronic ischemic changed, no acute findings EDP consulted neurology who recommended [facility] for stroke eval. Start asa, stroke order set utilized. Orthostatic vital sign obtained in the ED, sbp dropped from 164 to 145 upon standing up, heart rate stable, she looks dehydrated on exam, will start gentle hydration.   HTN: hold home bp meds norvasc/losartan, allow permissive hypertension for now. Hydralazine prn if sbp above 180.  Hypokalemia: replace k, check mag  QTC prolongation: correct k/mag, repeat ekg in am  Hypothyroidism: tsh elevated, will increase synthroid.  Macrocytosis: mcv 102, check b12/folate  Malnutrition: nutrition consult.  FTT/Dementia:  Daughter states patient has not seen a neurologist for this. She is on namzaric which will continue here.  I have discussed the risk of delirium  while patient is hospitalized, daughter expressed understanding. She is also open to short term SNF placement if indicated. PT eval ordered.  DVT prophylaxis: lovenox  Consultants: neurology  Code Status: DNR   Family Communication:  Patient and daughter at bedside  Disposition Plan: admit to [facility]   Time spent: 36mins  Sonoma Firkus MD, PhD [facility] Pager [phone] If 7PM-7AM, please contact night-coverage at [email or web address], password [email or web address]

## 2016-11-10 NOTE — ED Triage Notes (Addendum)
Daughter states pt. Has fail multiple times in the past week. More than 3x. Last Friday she fail in shower and hit her head . Daughter states since then her orientation has decreased. Pt. Is A/O X3. Does not know time. Pt. C/o of weakness that has been going on. Pt. Fail again today while in kitchen and was brought in via EMS.  Pt. Has hematoma on the back of head and skin tear on right forearm. Pt. Is not on blood thinner per EMS. Pt. Is positive for orthostatic and dizziness per EMS. Pt. Also had diarrhea Friday around 0530.

## 2016-11-10 NOTE — ED Notes (Signed)
Pt transported to X ray, put a sandwich at bedside for when pt returns.

## 2016-11-10 NOTE — ED Provider Notes (Signed)
[facility] DEPT Provider Note   CSN: [id number] Arrival date & time: 11/10/16  1119     History   Chief Complaint Chief Complaint  Patient presents with  . Fall  . Weakness    HPI JAMESA TEDRICK is a 83 y.o. female.  The history is provided by the patient, a relative and medical records.  Fall  This is a recurrent problem. The current episode started 3 to 5 hours ago. The problem occurs constantly. The problem has not changed since onset.Pertinent negatives include no chest pain, no abdominal pain, no headaches and no shortness of breath. The symptoms are aggravated by walking. Nothing relieves the symptoms. She has tried nothing for the symptoms. The treatment provided no relief.    Past Medical History:  Diagnosis Date  . Abnormality of gait   . Alzheimer's disease   . Calculus of kidney   . Closed fracture of seventh cervical vertebra without mention of spinal cord injury   . Depressive disorder, not elsewhere classified   . Edema   . Edema   . External hemorrhoids without mention of complication   . Gastric ulcer, unspecified as acute or chronic, without mention of hemorrhage, perforation, or obstruction   . GERD (gastroesophageal reflux disease)   . Hypertension   . Insomnia, unspecified   . Loss of weight   . Osteoarthrosis, unspecified whether generalized or localized, unspecified site   . Other protein-calorie malnutrition   . Pain in thoracic spine   . Personal history of fall   . Unspecified constipation   . Unspecified hereditary and idiopathic peripheral neuropathy   . Unspecified hypothyroidism   . Unspecified vitamin D deficiency   . Urinary frequency     Patient Active Problem List   Diagnosis Date Noted  . Vitamin D deficiency 03/07/2015  . Hx of fracture of vertebral column 12/28/2014  . Constipation 07/27/2013  . Fracture of vertebra 07/27/2013  . Loss of weight 07/13/2013  . Peripheral vascular disease, unspecified ([facility]) 07/13/2013  .  Epigastric mass 07/13/2013  . Dementia with behavioral disturbance   . Major depression ([place])   . Insomnia, unspecified   . Fall 09/07/2012  . Fracture of right distal radius 09/07/2012  . Hypothyroidism 08/18/2006  . HYPERCHOLESTEROLEMIA 08/18/2006  . Essential hypertension 08/18/2006  . Restless legs 08/18/2006    Past Surgical History:  Procedure Laterality Date  . WRIST SURGERY Right 2013    OB History    No data available       Home Medications    Prior to Admission medications   Medication Sig Start Date End Date Taking? Authorizing Provider  amLODipine (NORVASC) 5 MG tablet TAKE 1 TABLET(5 MG) BY MOUTH DAILY 06/10/16   Estill Dooms, MD  levothyroxine (SYNTHROID, LEVOTHROID) 88 MCG tablet Take one tablet by mouth once daily 30 minutes before breakfast for thyroid 05/05/16   Estill Dooms, MD  losartan (COZAAR) 100 MG tablet Take one tablet by mouth once daily to lower blood pressure 05/05/16   Estill Dooms, MD  lubiprostone (AMITIZA) 24 MCG capsule Take 1 capsule (24 mcg total) by mouth 2 (two) times daily with a meal. 07/02/16   [name], NP  megestrol (MEGACE) 40 MG/ML suspension Take 10 mLs (400 mg total) by mouth daily. 07/04/16   [name], NP  Memantine HCl-Donepezil HCl (NAMZARIC) 28-10 MG CP24 Take 1 tablet by mouth at bedtime. to stabilize memory 05/05/16   Estill Dooms, MD  venlafaxine XR (  EFFEXOR-XR) 75 MG 24 hr capsule TAKE 1 CAPSULE BY MOUTH EVERY DAY WITH BREAKFAST 10/13/16   [name], NP  Vitamin D, Ergocalciferol, (DRISDOL) 50000 units CAPS capsule Take one capsule by mouth every 14 days 05/13/16   [name], NP    Family History Family History  Problem Relation Age of Onset  . Cancer Mother   . CVA Father   . Cancer Brother   . Alcohol abuse Sister     Social History Social History  Substance Use Topics  . Smoking status: Never Smoker  . Smokeless tobacco: Never Used  . Alcohol use No     Allergies    Patient has no known allergies.   Review of Systems Review of Systems  Constitutional: Negative for chills, fatigue and fever.  HENT: Negative for congestion.   Eyes: Negative for visual disturbance.  Respiratory: Negative for cough, chest tightness, shortness of breath, wheezing and stridor.   Cardiovascular: Negative for chest pain, palpitations and leg swelling.  Gastrointestinal: Negative for abdominal pain, constipation, diarrhea, nausea and vomiting.  Genitourinary: Negative for dysuria, flank pain and frequency.  Musculoskeletal: Negative for back pain, neck pain and neck stiffness.  Skin: Positive for wound (skin tear on R arm).  Neurological: Positive for speech difficulty. Negative for seizures, facial asymmetry, weakness, light-headedness, numbness and headaches.  Psychiatric/Behavioral: Negative for agitation and confusion.  All other systems reviewed and are negative.    Physical Exam Updated Vital Signs BP (!) 174/85   Pulse 77   Temp (!) 97.5 F (36.4 C) (Oral)   Resp 18   SpO2 99%   Physical Exam  Constitutional: She appears well-developed and well-nourished. No distress.  HENT:  Head: Normocephalic.  Right Ear: External ear normal.  Left Ear: External ear normal.  Nose: Nose normal.  Mouth/Throat: Oropharynx is clear and moist. No oropharyngeal exudate.  Eyes: Pupils are equal, round, and reactive to light. Conjunctivae and EOM are normal.  Neck: Normal range of motion.  Cardiovascular: Normal rate and intact distal pulses.   No murmur heard. Pulmonary/Chest: Effort normal. No stridor. No respiratory distress. She has no wheezes. She has no rales. She exhibits no tenderness.  Abdominal: She exhibits no distension. There is no tenderness.  Musculoskeletal: She exhibits no edema, tenderness or deformity.  Neurological: She is alert. She is not disoriented. She displays no tremor and normal reflexes. No cranial nerve deficit or sensory deficit. She exhibits  normal muscle tone. Coordination and gait abnormal.  Patient unable to stand without falling. Patient unsteady. Normal finger nose finger test.  Speech clear but family reports it is slurred.  Skin: Capillary refill takes less than 2 seconds. No rash noted. She is not diaphoretic. No erythema.  Psychiatric: She has a normal mood and affect.  Nursing note and vitals reviewed.    ED Treatments / Results  Labs (all labs ordered are listed, but only abnormal results are displayed) Labs Reviewed  CBC WITH DIFFERENTIAL/PLATELET - Abnormal; Notable for the following:       Result Value   RBC 3.39 (*)    HCT 34.6 (*)    MCV 102.1 (*)    MCH 36.0 (*)    All other components within normal limits  COMPREHENSIVE METABOLIC PANEL - Abnormal; Notable for the following:    Potassium 3.2 (*)    Calcium 8.8 (*)    Total Bilirubin 1.3 (*)    GFR calc non Af Amer 57 (*)    All other components  within normal limits  TSH - Abnormal; Notable for the following:    TSH 5.808 (*)    All other components within normal limits  URINALYSIS, ROUTINE W REFLEX MICROSCOPIC - Abnormal; Notable for the following:    Hgb urine dipstick SMALL (*)    All other components within normal limits  URINE CULTURE  PROTIME-INR  LIPASE, BLOOD  MAGNESIUM  VITAMIN B12  FOLATE  MAGNESIUM  COMPREHENSIVE METABOLIC PANEL  LIPID PANEL  I-STAT CG4 LACTIC ACID, ED    EKG  EKG Interpretation  Date/Time:  Monday November 10 2016 13:55:54 EDT Ventricular Rate:  74 PR Interval:    QRS Duration: 100 QT Interval:  474 QTC Calculation: 526 R Axis:   59 Text Interpretation:  Sinus rhythm Nonspecific T abnormalities, lateral leads Prolonged QT interval When compared to prior, no significant changes seen.  No STEMI Confirmed by Antony Blackbird [phone]) on 11/10/2016 3:53:19 PM       Radiology Dg Chest 2 View  Result Date: 11/10/2016 CLINICAL DATA:  Fatigue, falls, altered mental status. EXAM: CHEST  2 VIEW COMPARISON:   Chest x-ray dated February 16, 2013 FINDINGS: The lungs are adequately inflated. The interstitial markings are coarse. There are increased lung markings at the left base likely in the lingula. The heart is normal in size. The pulmonary vascularity is not engorged. The lung markings are increased in the left upper lobe. There is calcification in the wall of the aortic arch. The bony thorax exhibits no acute abnormality. There is a high-grade compression fracture of L1. IMPRESSION: Chronic bronchitic changes. Subsegmental atelectasis in the lingula. No alveolar pneumonia nor CHF. Thoracic aortic atherosclerosis. High-grade L1 compression fracture which which is unchanged since an abdominal CT scan of July 20, 2013. Electronically Signed   By: David  Martinique M.D.   On: 11/10/2016 15:04   Ct Head Wo Contrast  Result Date: 11/10/2016 CLINICAL DATA:  Head injury after fall at home. EXAM: CT HEAD WITHOUT CONTRAST CT CERVICAL SPINE WITHOUT CONTRAST TECHNIQUE: Multidetector CT imaging of the head and cervical spine was performed following the standard protocol without intravenous contrast. Multiplanar CT image reconstructions of the cervical spine were also generated. COMPARISON:  CT scan of December 19, 2014. FINDINGS: CT HEAD FINDINGS Brain: Moderate chronic ischemic white matter disease is noted. Minimal diffuse cortical atrophy is noted. Stable calcified meningioma is noted in left posterior fossa. There is no evidence of hemorrhage or acute infarction. No mass effect or midline shift is noted. Vascular: No hyperdense vessel or unexpected calcification. Skull: Normal. Negative for fracture or focal lesion. Sinuses/Orbits: No acute finding. Other: None. CT CERVICAL SPINE FINDINGS Alignment: Normal. Skull base and vertebrae: No acute fracture. No primary bone lesion or focal pathologic process. Soft tissues and spinal canal: No prevertebral fluid or swelling. No visible canal hematoma. Disc levels: Severe degenerative  disc disease is noted at C3-4 and C5-6 with anterior osteophyte formation. Upper chest: Negative. Other: Levoscoliosis of cervical spine is noted. Degenerative changes seen involving the right-sided posterior facet joints. IMPRESSION: Moderate chronic ischemic white matter disease. Minimal diffuse cortical atrophy. Stable calcified meningioma seen in left posterior fossa. No acute intracranial abnormality seen. Severe multilevel degenerative disc disease. No acute abnormality seen in the cervical spine. Electronically Signed   By: Marijo Conception, M.D.   On: 11/10/2016 13:51   Ct Cervical Spine Wo Contrast  Result Date: 11/10/2016 CLINICAL DATA:  Head injury after fall at home. EXAM: CT HEAD WITHOUT CONTRAST CT CERVICAL SPINE WITHOUT CONTRAST  TECHNIQUE: Multidetector CT imaging of the head and cervical spine was performed following the standard protocol without intravenous contrast. Multiplanar CT image reconstructions of the cervical spine were also generated. COMPARISON:  CT scan of December 19, 2014. FINDINGS: CT HEAD FINDINGS Brain: Moderate chronic ischemic white matter disease is noted. Minimal diffuse cortical atrophy is noted. Stable calcified meningioma is noted in left posterior fossa. There is no evidence of hemorrhage or acute infarction. No mass effect or midline shift is noted. Vascular: No hyperdense vessel or unexpected calcification. Skull: Normal. Negative for fracture or focal lesion. Sinuses/Orbits: No acute finding. Other: None. CT CERVICAL SPINE FINDINGS Alignment: Normal. Skull base and vertebrae: No acute fracture. No primary bone lesion or focal pathologic process. Soft tissues and spinal canal: No prevertebral fluid or swelling. No visible canal hematoma. Disc levels: Severe degenerative disc disease is noted at C3-4 and C5-6 with anterior osteophyte formation. Upper chest: Negative. Other: Levoscoliosis of cervical spine is noted. Degenerative changes seen involving the right-sided  posterior facet joints. IMPRESSION: Moderate chronic ischemic white matter disease. Minimal diffuse cortical atrophy. Stable calcified meningioma seen in left posterior fossa. No acute intracranial abnormality seen. Severe multilevel degenerative disc disease. No acute abnormality seen in the cervical spine. Electronically Signed   By: Marijo Conception, M.D.   On: 11/10/2016 13:51    Procedures Procedures (including critical care time)  Medications Ordered in ED Medications  0.9 %  sodium chloride infusion (not administered)  potassium chloride 20 MEQ/15ML (10%) solution 40 mEq (not administered)  aspirin EC tablet 81 mg (not administered)  hydrALAZINE (APRESOLINE) injection 5 mg (not administered)  sodium chloride 0.9 % bolus 500 mL (0 mLs Intravenous Stopped 11/10/16 1353)     Initial Impression / Assessment and Plan / ED Course  I have reviewed the triage vital signs and the nursing notes.  Pertinent labs & imaging results that were available during my care of the patient were reviewed by me and considered in my medical decision making (see chart for details).     SHERL YZAGUIRRE is a 83 y.o. female with a past medical history significant for hypertension, hypothyroidism, hypercholesterolemia, depression, peripheral vascular disease, and some dementia who presents with multiple falls, head injury, skin tear on the right arm, diarrhea, confusion, and new slurred speech. Patient has come in by daughter who works at this facility. She reports that the patient since Friday has had approximately 10 falls. She reports that patient had diarrhea on Thursday and Friday. Patient is also had some urinary incontinence over the last few weeks. Patient denies any problems but continues to fall hitting her head. Patient reports that she is post use a walker but has not been doing so. The daughter says that today, she had another fall and they decided to bring her in for evaluation. The daughter says that the  patient has not had any fevers or chills to her knowledge. The patientsays that she has had some lightheaded spells but denies constant room spinning dizziness. She denies any vision changes. She denied nausea or vomiting. Patient is feeling fatigued and "weak all over".   The patient's daughter is also concerned that the patient is having slurred speech. She says that her speech is very different than normal. She denies any history of strokes or TIAs to her knowledge. Daughter reports patient has also been very sleepy.  Patient denies any current chest pain, palpitations, shortness of breath or rest for her symptoms. She denies abdominal pain or back  pain.    on exam, patient moving all cavities. Patient has generalized weakness. No focal neurologic deficits on my exam. No facial droop. Patient speech did not appear slurred to me however, daughter continues to feel it is not normal. Patient had normal extraocular movements and he will exam normal. No neck tenderness or stiffness. Immature grip strength. No pronator drift. Abdomen and chest are nontender. Skin tear seen on right forearm. No laceration seen on the head.  Patient will have workup to look for etiology of symptoms. Patient will have workup to look for occult infection, dehydration, and electrolyte abnormalities. Given the patient's numerous falls, the daughter's report of her having difficulty walking, and the report of slurred speech, patient will have workup to look for TIA or stroke as well.    Patient's diagnostic laboratory testing showed slight elevation of TSH. Other abnormalities included mild hypokalemia. Lactic acid nonelevated. Urinalysis revealed no UTI.  Chest x-ray shows no pneumonia. CT of the head showed diffuse atrophy and some chronic ischemic disease. No evidence of injury to the cervical spine.  Patient was ambulated by me and had significant unsteadiness. Patient tried to fall whenever she stood. According to  daughter, this is a significant change.  Neurology was called given the coordination of normality with gait and the reported slurred speech. Neurology recommended admission to [facility] for their evaluation and for MRI imaging. Patient will also need physical therapy and occupational therapy evaluation given the continued falling. At this time, do not feel patient has infection causing her symptoms.  Hospitalist team called and patient will be admitted.   Final Clinical Impressions(s) / ED Diagnoses   Final diagnoses:  Multiple falls     Clinical Impression: 1. Multiple falls     Disposition: Admit to Hospitalist service    Hadli Vandemark, Gwenyth Allegra, MD 11/10/16 2007

## 2016-11-10 NOTE — ED Notes (Signed)
[facility] contacted for transport to [facility] 

## 2016-11-10 NOTE — Progress Notes (Signed)
Pt arrived to unit. Alert to person, place. No c/o pain or discomfort. Pt oriented to unit. Will continue to monitor.

## 2016-11-10 NOTE — ED Notes (Signed)
Bed: WA18 Expected date:  Expected time:  Means of arrival:  Comments: EMS-weakness 

## 2016-11-11 ENCOUNTER — Inpatient Hospital Stay (HOSPITAL_COMMUNITY): Payer: Medicare HMO

## 2016-11-11 DIAGNOSIS — F0391 Unspecified dementia with behavioral disturbance: Secondary | ICD-10-CM

## 2016-11-11 DIAGNOSIS — R4781 Slurred speech: Secondary | ICD-10-CM

## 2016-11-11 DIAGNOSIS — I351 Nonrheumatic aortic (valve) insufficiency: Secondary | ICD-10-CM

## 2016-11-11 DIAGNOSIS — R296 Repeated falls: Secondary | ICD-10-CM

## 2016-11-11 DIAGNOSIS — R451 Restlessness and agitation: Secondary | ICD-10-CM | POA: Insufficient documentation

## 2016-11-11 DIAGNOSIS — E039 Hypothyroidism, unspecified: Secondary | ICD-10-CM

## 2016-11-11 DIAGNOSIS — G9340 Encephalopathy, unspecified: Secondary | ICD-10-CM

## 2016-11-11 LAB — LIPID PANEL
Cholesterol: 156 mg/dL (ref 0–200)
HDL: 61 mg/dL (ref >40)
LDL Cholesterol: 87 mg/dL (ref 0–99)
Total CHOL/HDL Ratio: 2.6 RATIO
Triglycerides: 42 mg/dL (ref <150)
VLDL: 8 mg/dL (ref 0–40)

## 2016-11-11 LAB — URINE CULTURE

## 2016-11-11 LAB — COMPREHENSIVE METABOLIC PANEL
ALT: 14 U/L (ref 14–54)
ALT: 16 U/L (ref 14–54)
AST: 26 U/L (ref 15–41)
AST: 33 U/L (ref 15–41)
Albumin: 3.8 g/dL (ref 3.5–5.0)
Albumin: 3.8 g/dL (ref 3.5–5.0)
Alkaline Phosphatase: 62 U/L (ref 38–126)
Alkaline Phosphatase: 56 U/L (ref 38–126)
Anion gap: 8 (ref 5–15)
Anion gap: 7 (ref 5–15)
BUN: 12 mg/dL (ref 6–20)
BUN: 10 mg/dL (ref 6–20)
CO2: 24 mmol/L (ref 22–32)
CO2: 26 mmol/L (ref 22–32)
Calcium: 8.8 mg/dL — ABNORMAL LOW (ref 8.9–10.3)
Calcium: 9 mg/dL (ref 8.9–10.3)
Chloride: 105 mmol/L (ref 101–111)
Chloride: 102 mmol/L (ref 101–111)
Creatinine, Ser: 0.91 mg/dL (ref 0.44–1.00)
Creatinine, Ser: 1 mg/dL (ref 0.44–1.00)
GFR calc Af Amer: >60 mL/min (ref >60)
GFR calc Af Amer: 59 mL/min — ABNORMAL LOW (ref >60)
GFR calc non Af Amer: 57 mL/min — ABNORMAL LOW (ref >60)
GFR calc non Af Amer: 51 mL/min — ABNORMAL LOW (ref >60)
Glucose, Bld: 98 mg/dL (ref 65–99)
Glucose, Bld: 129 mg/dL — ABNORMAL HIGH (ref 65–99)
Potassium: 2.7 mmol/L — CL (ref 3.5–5.1)
Potassium: 3.1 mmol/L — ABNORMAL LOW (ref 3.5–5.1)
Sodium: 137 mmol/L (ref 135–145)
Sodium: 135 mmol/L (ref 135–145)
Total Bilirubin: 1.2 mg/dL (ref 0.3–1.2)
Total Bilirubin: 1.1 mg/dL (ref 0.3–1.2)
Total Protein: 6.4 g/dL — ABNORMAL LOW (ref 6.5–8.1)
Total Protein: 6.7 g/dL (ref 6.5–8.1)

## 2016-11-11 LAB — ECHOCARDIOGRAM COMPLETE
Area-P 1/2: 2.34 cm2
E decel time: 320 msec
E/e' ratio: 12.36
FS: 36 % (ref 28–44)
Height: 64 in
IVS/LV PW RATIO, ED: 1.22
LA ID, A-P, ES: 24 mm
LA diam end sys: 24 mm
LA diam index: 1.5 cm/m2
LA vol A4C: 18.8 ml
LA vol index: 17.4 mL/m2
LA vol: 27.8 mL
LV E/e' medial: 12.36
LV E/e'average: 12.36
LV PW d: 9 mm — AB (ref 0.6–1.1)
LV dias vol index: 24 mL/m2
LV dias vol: 39 mL — AB (ref 46–106)
LV e' LATERAL: 3.81 cm/s
LV sys vol index: 7 mL/m2
LV sys vol: 11 mL — AB
LVOT SV: 52 mL
LVOT VTI: 20.6 cm
LVOT area: 2.54 cm2
LVOT diameter: 18 mm
LVOT peak grad rest: 3 mmHg
LVOT peak vel: 92.7 cm/s
Lateral S' vel: 13.2 cm/s
MV Dec: 320
MV pk A vel: 104 m/s
MV pk E vel: 47.1 m/s
P 1/2 time: 94 ms
Reg peak vel: 218 cm/s
Simpson's disk: 71
Stroke v: 28 ml
TAPSE: 16.5 mm
TDI e' lateral: 3.81
TDI e' medial: 3.26
TR max vel: 218 cm/s
Weight: 2000 oz

## 2016-11-11 LAB — VITAMIN B12: Vitamin B-12: 313 pg/mL (ref 180–914)

## 2016-11-11 LAB — CBC
HCT: 39.5 % (ref 36.0–46.0)
Hemoglobin: 13.4 g/dL (ref 12.0–15.0)
MCH: 34.6 pg — ABNORMAL HIGH (ref 26.0–34.0)
MCHC: 33.9 g/dL (ref 30.0–36.0)
MCV: 102.1 fL — ABNORMAL HIGH (ref 78.0–100.0)
Platelets: 227 10*3/uL (ref 150–400)
RBC: 3.87 MIL/uL (ref 3.87–5.11)
RDW: 12.4 % (ref 11.5–15.5)
WBC: 7.1 10*3/uL (ref 4.0–10.5)

## 2016-11-11 LAB — T4, FREE: Free T4: 0.9 ng/dL (ref 0.61–1.12)

## 2016-11-11 LAB — HEMOGLOBIN A1C
Hgb A1c MFr Bld: 5.2 % (ref 4.8–5.6)
Mean Plasma Glucose: 102.54 mg/dL

## 2016-11-11 LAB — MAGNESIUM: Magnesium: 2 mg/dL (ref 1.7–2.4)

## 2016-11-11 LAB — FOLATE: Folate: 11.8 ng/mL (ref >5.9)

## 2016-11-11 MED ORDER — LORAZEPAM 2 MG/ML IJ SOLN
0.5000 mg | Freq: Once | INTRAMUSCULAR | Status: AC
  Administered 2016-11-11: 0.5 mg via INTRAVENOUS

## 2016-11-11 MED ORDER — LORAZEPAM 2 MG/ML IJ SOLN
1.0000 mg | Freq: Once | INTRAMUSCULAR | Status: AC
  Administered 2016-11-11: 1 mg via INTRAVENOUS

## 2016-11-11 MED ORDER — LORAZEPAM 2 MG/ML IJ SOLN: 1.0000 mg | Freq: Once | INTRAMUSCULAR | Status: DC

## 2016-11-11 MED ORDER — ENSURE ENLIVE PO LIQD: 237.0000 mL | Freq: Two times a day (BID) | ORAL | Status: DC

## 2016-11-11 MED ORDER — POTASSIUM CHLORIDE CRYS ER 20 MEQ PO TBCR
40.0000 meq | EXTENDED_RELEASE_TABLET | Freq: Two times a day (BID) | ORAL | Status: AC
  Administered 2016-11-11 (×2): 40 meq via ORAL
  Filled 2016-11-11 (×2): qty 2

## 2016-11-11 NOTE — Progress Notes (Signed)
PROGRESS NOTE    [name]  OEU:[id number] DOB: 05-20-1931 DOA: 11/10/2016 PCP: [name], NP   Chief Complaint  Patient presents with  . Fall  . Weakness    Brief Narrative:  HPI on 11/10/2016 by Dr. Florencia Reasons [name] is a 83 y.o. female With h/o dementia (per daughter diagnosed 4-44yrs ago), depression, HTN is brought to [facility] ED due to above complaints.   ED course: vital stable. CT head/cervical spine no acute findings. Basic labs showed macrocytosis mcv 102, hypokalemia  k 3.2 , mild elevated bilirubin at 1.3, elevated tsh at 5.8, otherwise unremarkable. ua no infection. cxr no acute findings. EKG sinus rhythm, QTC 526. EDP talked to neurology Dr Reeves Forth who recommended patient admitted to hospitalist service and transfer to [facility] cone for further stroke work up.   Patient is oriented to person only, daughter at bedside helped with the history. Per daughter patient recently went to [place] for two weeks, returned to [facility] Thursday. Patient has not been taking her home meds regularly while she is in [place] visiting. On Friday am , patient had some nausea, and one episode of diarrhea. patient has multiple falls since Friday and her speech is slurred which is not her baseline. No fever, no cough. No abdominal pain. Daughter is not sure if Patient has had bm since Friday.  Per chart review, there is h/o constipation.  Assessment & Plan   Slurred speech with frequent falls, rule out CVA -CT head: No acute intracranial normality seen -Pending MRI, echocardiogram, lipid panel -Hemoglobin A1c 5.2 -Neurology consultation appreciated -PT/OT recommended SNF  Essential hypertension -Home meds Norvasc, losartan held the left vermis of hypertension given possible CVA -Continue hydralazine as needed  Hypokalemia -replaced, continue to monitor BMP -Magnesium 1.9  QTC prolongation -Continue cataract potassium and repeat EKG  Hypothyroidism -TSH 5.808, will obtain  FT4 -admitting physician increased synthroid  Macrocytosis -Pending folate, B-12 levels -Continue to monitor  Malnutrition -nutrition consulted  Failure to thrive/Dementia -Continue namzaric  DVT Prophylaxis  lovenox  Code Status: DNR  Family Communication: None at bedside  Disposition Plan: Admitted. Pending CVA workup. SNF at discharge  Consultants Neurology  Procedures  None  Antibiotics   Anti-infectives    None      Subjective:   [name] seen and examined today. Patient has no complaints, however sleepy (she received ativan).   Objective:   Vitals:   11/10/16 2225 11/10/16 2350 11/11/16 0417 11/11/16 1034  BP: (!) 171/91 (!) 162/83 (!) 154/84 (!) 178/89  Pulse:  72 79 85  Resp:  18 18 20   Temp:  97.9 F (36.6 C) 98.6 F (37 C) 98 F (36.7 C)  TempSrc:  Oral Oral Axillary  SpO2:  94% 94%   Weight:      Height:        Intake/Output Summary (Last 24 hours) at 11/11/16 1513 Last data filed at 11/11/16 1442  Gross per 24 hour  Intake              480 ml  Output                0 ml  Net              480 ml   Filed Weights   11/10/16 2150  Weight: 56.7 kg (125 lb)   Exam  General: Well developed, well nourished, NAD, appears stated age  60: NCAT,mucous membranes moist.   Cardiovascular: S1 S2 auscultated, Regular  rate and rhythm.  Respiratory: Clear to auscultation bilaterally with equal chest rise  Abdomen: Soft, nontender, nondistended, + bowel sounds  Extremities: warm dry without cyanosis clubbing or edema  Neuro: AAOx2, difficult to examine, patient very sleepy- given ativan. Moves all extremities    Data Reviewed: I have personally reviewed following labs and imaging studies  CBC:  Recent Labs Lab 11/10/16 1309 11/10/16 2236 11/11/16 0725  WBC 7.0 7.9 7.1  NEUTROABS 4.8  --   --   HGB 12.2 12.2 13.4  HCT 34.6* 36.0 39.5  MCV 102.1* 102.3* 102.1*  PLT 204 206 347   Basic Metabolic Panel:  Recent Labs Lab  11/10/16 1309 11/10/16 2236 11/11/16 0725  NA 138  --  137  K 3.2*  --  2.7*  CL 105  --  105  CO2 24  --  24  GLUCOSE 96  --  98  BUN 19  --  12  CREATININE 0.91 1.09* 0.91  CALCIUM 8.8*  --  8.8*  MG 1.9  --   --    GFR: Estimated Creatinine Clearance: 40.4 mL/min (by C-G formula based on SCr of 0.91 mg/dL). Liver Function Tests:  Recent Labs Lab 11/10/16 1309 11/11/16 0725  AST 27 26  ALT 16 14  ALKPHOS 64 62  BILITOT 1.3* 1.2  PROT 6.6 6.4*  ALBUMIN 3.9 3.8    Recent Labs Lab 11/10/16 1309  LIPASE 28   No results for input(s): AMMONIA in the last 168 hours. Coagulation Profile:  Recent Labs Lab 11/10/16 1309  INR 1.07   Cardiac Enzymes: No results for input(s): CKTOTAL, CKMB, CKMBINDEX, TROPONINI in the last 168 hours. BNP (last 3 results) No results for input(s): PROBNP in the last 8760 hours. HbA1C:  Recent Labs  11/11/16 0725  HGBA1C 5.2   CBG: No results for input(s): GLUCAP in the last 168 hours. Lipid Profile: No results for input(s): CHOL, HDL, LDLCALC, TRIG, CHOLHDL, LDLDIRECT in the last 72 hours. Thyroid Function Tests:  Recent Labs  11/10/16 1310  TSH 5.808*   Anemia Panel: No results for input(s): VITAMINB12, FOLATE, FERRITIN, TIBC, IRON, RETICCTPCT in the last 72 hours. Urine analysis:    Component Value Date/Time   COLORURINE YELLOW 11/10/2016 1257   APPEARANCEUR CLEAR 11/10/2016 1257   LABSPEC 1.015 11/10/2016 1257   PHURINE 6.0 11/10/2016 1257   GLUCOSEU NEGATIVE 11/10/2016 1257   HGBUR SMALL (A) 11/10/2016 1257   BILIRUBINUR NEGATIVE 11/10/2016 1257   KETONESUR NEGATIVE 11/10/2016 1257   PROTEINUR NEGATIVE 11/10/2016 1257   UROBILINOGEN 0.2 12/19/2014 2002   NITRITE NEGATIVE 11/10/2016 1257   LEUKOCYTESUR NEGATIVE 11/10/2016 1257   Sepsis Labs: @LABRCNTIP (procalcitonin:4,lacticidven:4)  ) Recent Results (from the past 240 hour(s))  Urine culture     Status: Abnormal   Collection Time: 11/10/16 12:58 PM    Result Value Ref Range Status   Specimen Description URINE, CLEAN CATCH  Final   Special Requests NONE  Final   Culture MULTIPLE SPECIES PRESENT, SUGGEST RECOLLECTION (A)  Final   Report Status 11/11/2016 FINAL  Final      Radiology Studies: Dg Chest 2 View  Result Date: 11/10/2016 CLINICAL DATA:  Fatigue, falls, altered mental status. EXAM: CHEST  2 VIEW COMPARISON:  Chest x-ray dated February 16, 2013 FINDINGS: The lungs are adequately inflated. The interstitial markings are coarse. There are increased lung markings at the left base likely in the lingula. The heart is normal in size. The pulmonary vascularity is not engorged. The lung markings  are increased in the left upper lobe. There is calcification in the wall of the aortic arch. The bony thorax exhibits no acute abnormality. There is a high-grade compression fracture of L1. IMPRESSION: Chronic bronchitic changes. Subsegmental atelectasis in the lingula. No alveolar pneumonia nor CHF. Thoracic aortic atherosclerosis. High-grade L1 compression fracture which which is unchanged since an abdominal CT scan of July 20, 2013. Electronically Signed   By: David  Martinique M.D.   On: 11/10/2016 15:04   Ct Head Wo Contrast  Result Date: 11/10/2016 CLINICAL DATA:  Head injury after fall at home. EXAM: CT HEAD WITHOUT CONTRAST CT CERVICAL SPINE WITHOUT CONTRAST TECHNIQUE: Multidetector CT imaging of the head and cervical spine was performed following the standard protocol without intravenous contrast. Multiplanar CT image reconstructions of the cervical spine were also generated. COMPARISON:  CT scan of December 19, 2014. FINDINGS: CT HEAD FINDINGS Brain: Moderate chronic ischemic white matter disease is noted. Minimal diffuse cortical atrophy is noted. Stable calcified meningioma is noted in left posterior fossa. There is no evidence of hemorrhage or acute infarction. No mass effect or midline shift is noted. Vascular: No hyperdense vessel or unexpected  calcification. Skull: Normal. Negative for fracture or focal lesion. Sinuses/Orbits: No acute finding. Other: None. CT CERVICAL SPINE FINDINGS Alignment: Normal. Skull base and vertebrae: No acute fracture. No primary bone lesion or focal pathologic process. Soft tissues and spinal canal: No prevertebral fluid or swelling. No visible canal hematoma. Disc levels: Severe degenerative disc disease is noted at C3-4 and C5-6 with anterior osteophyte formation. Upper chest: Negative. Other: Levoscoliosis of cervical spine is noted. Degenerative changes seen involving the right-sided posterior facet joints. IMPRESSION: Moderate chronic ischemic white matter disease. Minimal diffuse cortical atrophy. Stable calcified meningioma seen in left posterior fossa. No acute intracranial abnormality seen. Severe multilevel degenerative disc disease. No acute abnormality seen in the cervical spine. Electronically Signed   By: Marijo Conception, M.D.   On: 11/10/2016 13:51   Ct Cervical Spine Wo Contrast  Result Date: 11/10/2016 CLINICAL DATA:  Head injury after fall at home. EXAM: CT HEAD WITHOUT CONTRAST CT CERVICAL SPINE WITHOUT CONTRAST TECHNIQUE: Multidetector CT imaging of the head and cervical spine was performed following the standard protocol without intravenous contrast. Multiplanar CT image reconstructions of the cervical spine were also generated. COMPARISON:  CT scan of December 19, 2014. FINDINGS: CT HEAD FINDINGS Brain: Moderate chronic ischemic white matter disease is noted. Minimal diffuse cortical atrophy is noted. Stable calcified meningioma is noted in left posterior fossa. There is no evidence of hemorrhage or acute infarction. No mass effect or midline shift is noted. Vascular: No hyperdense vessel or unexpected calcification. Skull: Normal. Negative for fracture or focal lesion. Sinuses/Orbits: No acute finding. Other: None. CT CERVICAL SPINE FINDINGS Alignment: Normal. Skull base and vertebrae: No acute  fracture. No primary bone lesion or focal pathologic process. Soft tissues and spinal canal: No prevertebral fluid or swelling. No visible canal hematoma. Disc levels: Severe degenerative disc disease is noted at C3-4 and C5-6 with anterior osteophyte formation. Upper chest: Negative. Other: Levoscoliosis of cervical spine is noted. Degenerative changes seen involving the right-sided posterior facet joints. IMPRESSION: Moderate chronic ischemic white matter disease. Minimal diffuse cortical atrophy. Stable calcified meningioma seen in left posterior fossa. No acute intracranial abnormality seen. Severe multilevel degenerative disc disease. No acute abnormality seen in the cervical spine. Electronically Signed   By: Marijo Conception, M.D.   On: 11/10/2016 13:51     Scheduled Meds: .  amitriptyline  50 mg Oral QHS  . aspirin EC  81 mg Oral Daily  . donepezil  10 mg Oral QHS  . enoxaparin (LOVENOX) injection  40 mg Subcutaneous Q24H  . feeding supplement (ENSURE ENLIVE)  237 mL Oral BID BM  . LORazepam  1 mg Intravenous Once  . memantine  28 mg Oral QHS  . potassium chloride  40 mEq Oral Once  . potassium chloride  40 mEq Oral BID  . senna-docusate  1 tablet Oral QHS  . venlafaxine XR  75 mg Oral Q breakfast   Continuous Infusions: . sodium chloride 75 mL/hr at 11/11/16 0800     LOS: 1 day   Time Spent in minutes   30 minutes  Angelissa Supan D.O. on 11/11/2016 at 3:13 PM  Between 7am to 7pm - Pager - [phone]  After 7pm go to [email or web address] - password [email or web address]  And look for the night coverage person covering for me after hours  [facility] Office  [phone]

## 2016-11-11 NOTE — Consult Note (Signed)
[facility] CM Primary Care Navigator  11/11/2016  [name] 02/16/1932 [id number]   Met with patient and daughter [name]) atthe bedside to identify possible discharge needs. Daughter reports that patient was having slurred speech and recurrent/ multiple falls thathadled to this admission.   Patient's daughter endorses Gabrielle Mustache, NP  with [employer] theprimary care provider.   Daughter shared using  [facility] pharmacy in [place] and [facility] service to obtain medications without difficulty.   Daughter states that she has been managingmedications for patient at home.  Patient's daughter verbalized that she is providing transportationto her doctors'appointments.  Patient lives at home with a daughter who is chair-bound. Daughter [name]- works at [employer]) provides assistance to patient at home when needed. She mentioned paying friends who come to assist patient with her needs at home. Daughter was provided with [facility] list of personal care services to use as back up for future needs, with the understanding that it has to be paid out of the pocket. Daughter was Contractor provided.   Anticipated discharge plan is skilled nursing facility (SNF) per therapy recommendation.  Patient's daughter voiced understanding to call primary care provider's officewhen patient returns backhome,for a post discharge follow-up within a week or sooner if needs arise.Patient letter (with PCP's contact number) was provided as areminder.   Discussed with daughter regarding [facility] CM services available for health management at home and she expressed understanding to seekreferral from primary care provider to [facility] care management asdeemed necessary and appropriatefor services in the future-once patient returns back home.  [facility] care management information was provided for future needs that may arise.    For questions,  please contact:  Dannielle Huh, BSN, RN- [facility] Primary Care Navigator  Telephone: [phone] [place]

## 2016-11-11 NOTE — Progress Notes (Signed)
*  PRELIMINARY RESULTS* Echocardiogram 2D Echocardiogram has been performed.  [name] 11/11/2016, 3:32 PM

## 2016-11-11 NOTE — Evaluation (Signed)
Speech Language Pathology Evaluation Patient Details Name: [name] MRN: [id number] DOB: 1931-03-02 Today's Date: 11/11/2016 Time: 0852-0900 SLP Time Calculation (min) (ACUTE ONLY): 8 min  Problem List:  Patient Active Problem List   Diagnosis Date Noted  . Slurred speech 11/10/2016  . Vitamin D deficiency 03/07/2015  . Hx of fracture of vertebral column 12/28/2014  . Constipation 07/27/2013  . Fracture of vertebra 07/27/2013  . Loss of weight 07/13/2013  . Peripheral vascular disease, unspecified ([place]) 07/13/2013  . Epigastric mass 07/13/2013  . Dementia with behavioral disturbance   . Major depression ([place])   . Insomnia, unspecified   . Fall 09/07/2012  . Fracture of right distal radius 09/07/2012  . Hypothyroidism 08/18/2006  . HYPERCHOLESTEROLEMIA 08/18/2006  . Essential hypertension 08/18/2006  . Restless legs 08/18/2006   Past Medical History:  Past Medical History:  Diagnosis Date  . Abnormality of gait   . Alzheimer's disease   . Calculus of kidney   . Closed fracture of seventh cervical vertebra without mention of spinal cord injury   . Depressive disorder, not elsewhere classified   . Edema   . Edema   . External hemorrhoids without mention of complication   . Gastric ulcer, unspecified as acute or chronic, without mention of hemorrhage, perforation, or obstruction   . GERD (gastroesophageal reflux disease)   . Hypertension   . Insomnia, unspecified   . Loss of weight   . Osteoarthrosis, unspecified whether generalized or localized, unspecified site   . Other protein-calorie malnutrition   . Pain in thoracic spine   . Personal history of fall   . Unspecified constipation   . Unspecified hereditary and idiopathic peripheral neuropathy   . Unspecified hypothyroidism   . Unspecified vitamin D deficiency   . Urinary frequency    Past Surgical History:  Past Surgical History:  Procedure Laterality Date  . WRIST SURGERY Right 2013   HPI:  [name] a 83 y.o.femalewith a past medical history significant for hypertension, hypothyroidism, hypercholesterolemia, depression, peripheral vascular disease, and some dementia who presents with 10 falls since Friday, head injury, skin tear on the right arm, diarrhea, confusion, and new slurred speech.    Assessment / Plan / Recommendation Clinical Impression   Limited evaluation today as pt was medicated/sedated for MRI.  Pt was able to be briefly awakened with constant thermotactile and auditory stimulation.  Pt was oriented to self independently but needed max assist and choice of two to orient to place and situation.  When awake, pt's speech was largely incomprehensible; however, she did have islands of clear speech where she was able to appropriately and intelligibly respond to questions.  Pt was restless but was accepting of oral care and did not actively attempt to pull at any of her restraints or lines.  Further assessment is warranted once pt clears from acute sedation.  ST to follow up while inpatient to maximize potential for return to previous level of function.      SLP Assessment  SLP Recommendation/Assessment: Patient needs continued Speech Lanaguage Pathology Services SLP Visit Diagnosis: Cognitive communication deficit (R41.841);Dysphagia, unspecified (R13.10)    Follow Up Recommendations  Skilled Nursing facility    Frequency and Duration min 2x/week         SLP Evaluation Cognition  Overall Cognitive Status: Impaired/Different from baseline Arousal/Alertness: Suspect due to medications Orientation Level: Oriented to person;Oriented to place;Oriented to situation (when provided with max assist, choice of two) Attention: Focused Focused Attention: Impaired Focused Attention  Impairment: Verbal basic Memory:  (UTA due to lethargy) Behaviors: Restless Safety/Judgment: Impaired Comments: pt with safety sitter due to pulling at lines all night        Comprehension   Auditory Comprehension Overall Auditory Comprehension: Appears within functional limits for tasks assessed (limited by cognition/fatigue, could briefly follow 1-step )    Expression Expression Primary Mode of Expression: Verbal Verbal Expression Overall Verbal Expression: Other (comment) (impacted by cognition, unable to fully assess)   Oral / Motor  Oral Motor/Sensory Function Overall Oral Motor/Sensory Function: Within functional limits Motor Speech Overall Motor Speech: Other (comment) (impacted by cognition/lethargy, islands of intelligibility)   GO                    Kezia Benevides, Selinda Orion 11/11/2016, 10:14 AM

## 2016-11-11 NOTE — Progress Notes (Signed)
OT Note- Addendum    11/11/16 1400  OT Visit Information  Last OT Received On 11/11/16  OT Time Calculation  OT Start Time (ACUTE ONLY) 1103  OT Stop Time (ACUTE ONLY) 1129  OT Time Calculation (min) 26 min  OT General Charges  $OT Visit 1 Visit  OT Evaluation  $OT Eval Moderate Complexity 1 [address] OT/L  [phone] 11/11/2016

## 2016-11-11 NOTE — Progress Notes (Signed)
Patient noted more alert at this time, requesting to go home and noted combative toward staffs. MD ordered ativan for MRI. MRI staff notify of change, MD aware of low potassium, K+ administered per order. Sitter at bedside.    Ave Filter, RN

## 2016-11-11 NOTE — Progress Notes (Signed)
STROKE TEAM PROGRESS NOTE   HISTORY OF PRESENT ILLNESS (per record) [name] a 83 y.o.femalewith a past medical history significant for hypertension, hypothyroidism, hypercholesterolemia, depression, peripheral vascular disease, and some dementia who presents with multiple falls, head injury, skin tear on the right arm, diarrhea, confusion, and new slurred speech. Patient has come in by daughter who works at this facility. She reports that the patient since Friday has had approximately 10 falls. She reports that patient had diarrhea on Thursday and Friday. Patient is also had some urinary incontinence over the last few weeks. Patient denies any problems but continues to fall hitting her head. Patient reports that she is post use a walker but has not been doing so. The daughter says that today, she had another fall and they decided to bring her in for evaluation. The daughter says that the patient has not had any fevers or chills to her knowledge. The patientsays that she has had some lightheaded spells but denies constant room spinning dizziness. She denies any vision changes. She denied nausea or vomiting. Patient is feeling fatigued and "weak all over".   The patient's daughter is also concerned that the patient is having slurred speech. She says that her speech is very different than normal. She denies any history of strokes or TIAs to her knowledge. Daughter reports patient has also been very sleepy.    Patient was not administered IV t-PA secondary to arriving outside of the tPA treatment window. She was admitted to the neuro ICU for further evaluation and treatment.   SUBJECTIVE (INTERVAL HISTORY) No family is at the bedside.  The pt is awake, alert, but agitated over the b/l mitten and confused, with improved clarity of speech.  Imaging negative so far but MRI pending.  Afebrile.  UA negative for UTI.   OBJECTIVE Temp:  [97.5 F (36.4 C)-98.6 F (37 C)] 98 F (36.7 C) (09/25  1034) Pulse Rate:  [72-90] 85 (09/25 1034) Cardiac Rhythm: Normal sinus rhythm (09/25 0700) Resp:  [16-20] 20 (09/25 1034) BP: (135-186)/(80-100) 178/89 (09/25 1034) SpO2:  [91 %-99 %] 94 % (09/25 0417) Weight:  [56.7 kg (125 lb)] 56.7 kg (125 lb) (09/24 2150)  CBC:   Recent Labs Lab 11/10/16 1309 11/10/16 2236 11/11/16 0725  WBC 7.0 7.9 7.1  NEUTROABS 4.8  --   --   HGB 12.2 12.2 13.4  HCT 34.6* 36.0 39.5  MCV 102.1* 102.3* 102.1*  PLT 204 206 740    Basic Metabolic Panel:   Recent Labs Lab 11/10/16 1309 11/10/16 2236 11/11/16 0725  NA 138  --  137  K 3.2*  --  2.7*  CL 105  --  105  CO2 24  --  24  GLUCOSE 96  --  98  BUN 19  --  12  CREATININE 0.91 1.09* 0.91  CALCIUM 8.8*  --  8.8*  MG 1.9  --   --     Lipid Panel:     Component Value Date/Time   CHOL 153 08/31/2010 0325   TRIG 61 08/31/2010 0325   HDL 68 08/31/2010 0325   CHOLHDL 2.3 08/31/2010 0325   VLDL 12 08/31/2010 0325   LDLCALC 73 08/31/2010 0325   HgbA1c:  Lab Results  Component Value Date   HGBA1C 5.2 11/11/2016   Urine Drug Screen: No results found for: LABOPIA, COCAINSCRNUR, LABBENZ, AMPHETMU, THCU, LABBARB  Alcohol Level No results found for: [place] I have personally reviewed the radiological images below and agree  with the radiology interpretations.  Dg Chest 2 View 11/10/2016 IMPRESSION: Chronic bronchitic changes. Subsegmental atelectasis in the lingula. No alveolar pneumonia nor CHF. Thoracic aortic atherosclerosis. High-grade L1 compression fracture which which is unchanged since an abdominal CT scan of July 20, 2013.   Ct Head Wo Contrast Ct Cervical Spine Wo Contrast 11/10/2016 IMPRESSION: Moderate chronic ischemic white matter disease. Minimal diffuse cortical atrophy. Stable calcified meningioma seen in left posterior fossa. No acute intracranial abnormality seen. Severe multilevel degenerative disc disease. No acute abnormality seen in the cervical spine.   Carotid  US 11/11/2016 Bilateral: 1-39% ICA stenosis. Vertebral artery flow is antegrade.   TTE 11/11/2016 - Vigorous LV systolic function; mild diastolic dysfunction;   sclerotic aortic valve with mild AI; oscillating density at LV   apex of uncertain etiology; suggest TEE to further assess.  MRI brain pending   PHYSICAL EXAM  Temp:  [97.5 F (36.4 C)-98.7 F (37.1 C)] 98.7 F (37.1 C) (09/25 2040) Pulse Rate:  [72-88] 82 (09/25 2040) Resp:  [18-20] 18 (09/25 2040) BP: (154-186)/(83-100) 162/87 (09/25 2040) SpO2:  [94 %-100 %] 96 % (09/25 2040) Weight:  [125 lb (56.7 kg)] 125 lb (56.7 kg) (09/24 2150)  General - Well nourished, well developed, agitated, not cooperative on exam.  Ophthalmologic - Fundi not visualized due to noncooperation.  Cardiovascular - Regular rate and rhythm.  Neuro - awake, alert but confused and agitated. Not cooperative on exam. Not following simple commands. Fluent language but moderate dysarthria. Not cooperative on naming or repetition. Blinking bilaterally to visual threat, PERRL, no gaze deviation, attending to both sides. Facial symmetric, tongue is midline. Moving all extremities symmetrically, no significant focal weakness. DTR 1+ and no Babinski. Sensation, coordination not cooperative, and gait not tested.   ASSESSMENT/PLAN [name] is a 83 y.o. female with history of hypertension, hypothyroidism, hypercholesterolemia, depression, peripheral vascular disease, and some dementia who presents with multiple falls, head injury, skin tear on the right arm, diarrhea, confusion, and new slurred speech. She did not receive IV t-PA due to arriving outside of the tPA treatment window.   Encephalopathy and agitation in the setting of advanced dementia  Resultant  improved mental status and dysarthria  CT head: no acute stroke  MRI head pending  Carotid Doppler unremarkable  2D Echo EF 65-70%, oscillating density at LV apex of uncertain  etiology  Recommend TEE to rule out cardiac abnormality  LDL 73  HgbA1c 5.2  SCDs for VTE prophylaxis Diet Heart Room service appropriate? Yes; Fluid consistency: Thin  No antithrombotic prior to admission, now on aspirin 81 mg daily.  Ongoing aggressive stroke risk factor management  Therapy recommendations: SNF  Disposition: pending  Dementia  On Aricept and Namenda  Resultant agitation and confusion  Likely at baseline  Hypertension  Stable Long-term BP goal normotensive  Hyperlipidemia  Home meds: none  LDL 73, goal < 100  No need to introduce statin at this time  Other Stroke Risk Factors  Advanced age  Family hx stroke (father)  Other Active Problems  Hypokalemia 3.2 -> 2.7; repleting  Hospital day # 1  Rosalin Hawking, MD PhD Stroke Neurology 11/11/2016 9:26 PM   To contact Stroke Continuity provider, please refer to [email or web address] After hours, contact General Neurology

## 2016-11-11 NOTE — Progress Notes (Signed)
*  PRELIMINARY RESULTS* Vascular Ultrasound Carotid Duplex (Doppler) has been completed.  Preliminary findings: Bilateral 1-39% ICA stenosis, antegrade vertebral flow.   Everrett Coombe 11/11/2016, 4:16 PM

## 2016-11-11 NOTE — Progress Notes (Signed)
MRI called to have pt down for scan at this time since she is noted less agitated and calm. Per MRI staff, will get her to scan as soon as possible.  Hav, RN

## 2016-11-11 NOTE — Evaluation (Signed)
Clinical/Bedside Swallow Evaluation Patient Details  Name: [name] MRN: [id number] Date of Birth: 1931/03/20  Today's Date: 11/11/2016 Time: SLP Start Time (ACUTE ONLY): 0845 SLP Stop Time (ACUTE ONLY): 0852 SLP Time Calculation (min) (ACUTE ONLY): 7 min  Past Medical History:  Past Medical History:  Diagnosis Date  . Abnormality of gait   . Alzheimer's disease   . Calculus of kidney   . Closed fracture of seventh cervical vertebra without mention of spinal cord injury   . Depressive disorder, not elsewhere classified   . Edema   . Edema   . External hemorrhoids without mention of complication   . Gastric ulcer, unspecified as acute or chronic, without mention of hemorrhage, perforation, or obstruction   . GERD (gastroesophageal reflux disease)   . Hypertension   . Insomnia, unspecified   . Loss of weight   . Osteoarthrosis, unspecified whether generalized or localized, unspecified site   . Other protein-calorie malnutrition   . Pain in thoracic spine   . Personal history of fall   . Unspecified constipation   . Unspecified hereditary and idiopathic peripheral neuropathy   . Unspecified hypothyroidism   . Unspecified vitamin D deficiency   . Urinary frequency    Past Surgical History:  Past Surgical History:  Procedure Laterality Date  . WRIST SURGERY Right 2013   HPI:  [name] a 83 y.o.femalewith a past medical history significant for hypertension, hypothyroidism, hypercholesterolemia, depression, peripheral vascular disease, and some dementia who presents with 10 falls since Friday, head injury, skin tear on the right arm, diarrhea, confusion, and new slurred speech.    Assessment / Plan / Recommendation Clinical Impression   Pt presents with impaired swallowing safety related to current confusion and lethargy s/p administration of Ativan.  Pt was briefly arousable for consumption of purees and thin liquids.  Pt impulsively took large consecutive sips of  thin liquids via straw with immediate coughing noted.  Suspect that coughing was related to rate and portion control as well as lethargy rather than acute, primary impairment as swallow response subjectively appeared swift and strong.  Pt was unable to follow commands for oral motor exam but her face was symmetrical at rest and she demonstrated appropriate range of motion when accepting oral care completed by therapist.  For now, recommend that pt remain on regular diet and thin liquids but that she only eat when she is fully awake.  RN made aware.   SLP Visit Diagnosis: Cognitive communication deficit (R41.841);Dysphagia, unspecified (R13.10)    Aspiration Risk  Moderate aspiration risk (due to fatigue and confusion)    Diet Recommendation Regular;Thin liquid (once fully alert)   Liquid Administration via: Cup;Straw Medication Administration: Crushed with puree Supervision: Full supervision/cueing for compensatory strategies Compensations: Minimize environmental distractions;Slow rate;Small sips/bites Postural Changes: Seated upright at 90 degrees    Other  Recommendations Oral Care Recommendations: Oral care BID   Follow up Recommendations Skilled Nursing facility      Frequency and Duration min 2x/week          Prognosis Prognosis for Safe Diet Advancement: Good (with improved alertness) Barriers to Reach Goals: Cognitive deficits;Behavior;Medication      Swallow Study   General Date of Onset:  (see HPI) HPI: [name] a 83 y.o.femalewith a past medical history significant for hypertension, hypothyroidism, hypercholesterolemia, depression, peripheral vascular disease, and some dementia who presents with 10 falls since Friday, head injury, skin tear on the right arm, diarrhea, confusion, and new slurred speech.  Type of Study: Bedside Swallow Evaluation Previous Swallow Assessment: none on record Diet Prior to this Study: Regular;Thin liquids Temperature Spikes Noted:  No Respiratory Status: Room air History of Recent Intubation: No Behavior/Cognition: Confused;Lethargic/Drowsy;Requires cueing Oral Cavity Assessment: Dry Oral Care Completed by SLP: Yes Oral Cavity - Dentition: Adequate natural dentition Self-Feeding Abilities: Needs assist Patient Positioning: Upright in bed Baseline Vocal Quality: Normal Volitional Cough: Cognitively unable to elicit Volitional Swallow: Unable to elicit    Oral/Motor/Sensory Function Overall Oral Motor/Sensory Function: Within functional limits (Difficult to fully assess due to fatigue and confusion)   Ice Chips     Thin Liquid Thin Liquid: Impaired Pharyngeal  Phase Impairments: Cough - Immediate Other Comments: suspect coughing to be related to large, consecutive boluses; swallow responses appeared timely    Nectar Thick     Honey Thick     Puree Puree: Within functional limits   Solid   GO            Dilpreet Faires, Elmyra Ricks L 11/11/2016,9:14 AM

## 2016-11-11 NOTE — Progress Notes (Signed)
Pt behaviors include disconnecting IV, pulling off telemetry, getting legs over rails of bed with three rails up. Brought pt to nurses station in [place] where she became combative when RN restarted IV fluids. Mittens placed on pt hands. Pt argumentive, tried to get out of recliner, and occasionally yelled, "I'm going to scream", then screamed. [facility] on call notified. Ativan given to pt, who then became more drowsy and went off floor to MRI. Continuing to monitor.

## 2016-11-11 NOTE — Consult Note (Signed)
Neurology Consultation Reason for Consult: Possible stroke Referring Physician: Dr Ree Kida   CC: Slurred Speech    HPI: [name] is a 83 y.o. female with dementia, depression, HTN is brought to [facility] ED due for recurrent falls and slurred speech.   History is obtained from chart review. Patient sedated on assessment and no family at bedside.   According to ER physician Dr Sherry Ruffing  "[name] is a 83 y.o. female with a past medical history significant for hypertension, hypothyroidism, hypercholesterolemia, depression, peripheral vascular disease, and some dementia who presents with multiple falls, head injury, skin tear on the right arm, diarrhea, confusion, and new slurred speech. Patient has come in by daughter who works at this facility. She reports that the patient since Friday has had approximately 10 falls. She reports that patient had diarrhea on Thursday and Friday. Patient is also had some urinary incontinence over the last few weeks. Patient denies any problems but continues to fall hitting her head. Patient reports that she is post use a walker but has not been doing so. The daughter says that today, she had another fall and they decided to bring her in for evaluation. The daughter says that the patient has not had any fevers or chills to her knowledge. The patientsays that she has had some lightheaded spells but denies constant room spinning dizziness. She denies any vision changes. She denied nausea or vomiting. Patient is feeling fatigued and "weak all over".   The patient's daughter is also concerned that the patient is having slurred speech. She says that her speech is very different than normal. She denies any history of strokes or TIAs to her knowledge. Daughter reports patient has also been very sleepy."    ROS:  Unable to obtain due to altered mental status.   Past Medical History:  Diagnosis Date  . Abnormality of gait   . Alzheimer's disease   . Calculus of kidney    . Closed fracture of seventh cervical vertebra without mention of spinal cord injury   . Depressive disorder, not elsewhere classified   . Edema   . Edema   . External hemorrhoids without mention of complication   . Gastric ulcer, unspecified as acute or chronic, without mention of hemorrhage, perforation, or obstruction   . GERD (gastroesophageal reflux disease)   . Hypertension   . Insomnia, unspecified   . Loss of weight   . Osteoarthrosis, unspecified whether generalized or localized, unspecified site   . Other protein-calorie malnutrition   . Pain in thoracic spine   . Personal history of fall   . Unspecified constipation   . Unspecified hereditary and idiopathic peripheral neuropathy   . Unspecified hypothyroidism   . Unspecified vitamin D deficiency   . Urinary frequency      Family History  Problem Relation Age of Onset  . Cancer Mother   . CVA Father   . Cancer Brother   . Alcohol abuse Sister      Social History:  reports that she has never smoked. She has never used smokeless tobacco. She reports that she does not drink alcohol or use drugs.   Exam: Current vital signs: BP (!) 154/84 (BP Location: Right Arm)   Pulse 79   Temp 98.6 F (37 C) (Oral)   Resp 18   Ht 5\' 4"  (1.626 m)   Wt 56.7 kg (125 lb)   SpO2 94%   BMI 21.46 kg/m  Vital signs in last 24 hours: Temp:  [  97.5 F (36.4 C)-98.6 F (37 C)] 98.6 F (37 C) (09/25 0417) Pulse Rate:  [72-90] 79 (09/25 0417) Resp:  [16-20] 18 (09/25 0417) BP: (135-186)/(80-100) 154/84 (09/25 0417) SpO2:  [91 %-99 %] 94 % (09/25 0417) Weight:  [56.7 kg (125 lb)] 56.7 kg (125 lb) (09/24 2150)   Physical Exam  Constitutional: Appears well-developed Psych: Affect appropriate to situation Eyes: No scleral injection HENT: No OP obstrucion Head: Normocephalic.  Cardiovascular: Normal rate and regular rhythm.  Respiratory: Effort normal and breath sounds normal to anterior ascultation GI: Soft.  No  distension. There is no tenderness.  Skin: WDI  Neuro: Mental Status: Patient sedated after receiving ativan, unable to arouse despite noxious stimuli  Cranial Nerves: Pupils are equally reactive to light, 60mm, no gaze devation Tongue midline, face is symmetric  Motor: Tone is normal. Bulk is normal. Withdraws in all 4 extremities equally  Sensory: Withdraws to pain bilaterally Deep Tendon Reflexes: 2+ and symmetric in the biceps and patellae.  Plantars: Toes are downgoing bilaterally.  Cerebellar: Unable to assess   ASSESSMENT AND PLAN 83 y.o. female with a past medical history significant for hypertension, hypothyroidism, hypercholesterolemia, depression, peripheral vascular disease, and  dementia admitted for recurrent falls and slurred speech since Thursday. History is concerning for a subacute stroke and patient admitted for neurological evaluation.   Slurred speech- possible stroke  Dementia   Possible Stroke   # MRI of the brain without contrast #MRA Head  #carotid ultrasound #Transthoracic Echo  #ASA 325 mg daily #Start or continue Atorvastatin 80 mg/other high intensity statin # BP goal: permissive HTN upto 707 systolic, PRNs above 21 # HBAIC and Lipid profile # Telemetry monitoring # Frequent neuro checks # stroke swallow screen  Dementia Continue Aricept,Namenda Avoid anticholinergics such as Amitriptyline    Please page stroke NP  Or  PA  Or MD from 8am -4 pm  as this patient from this time will be  followed by the stroke.   You can look them up on [email or web address]  Password [email or web address]

## 2016-11-11 NOTE — Therapy (Signed)
Occupational Therapy Evaluation Patient Details Name: [name] MRN: [id number] DOB: 11/19/1931 Today's Date: 11/11/2016    History of Present Illness Pt is a 83 y.o. female who presented to [facility] with a head injury from a fall, skin tear to right arm, diarrhea, confusion, and new slurred speech. Pt with 10 falls since Friday. PMH significant for hypertension, hypothyroidism, hypercholesterolemia, depression, peripheral vascular disease, and dementia.    Clinical Impression   Pt unable to provided PLOF and no family present during session. Per chart review, pt walked with RW and supervision. Currently pt requires mod assist +2 for functional mobility and ADLs and min assist for sit to stand transfers. Pt presenting with cognitive deficits and required verbal/tactile cues to complete tasks. Pt would benefit from SNF to increase independence and promote return to PLOF. OT will follow acutely to address established goals.     Follow Up Recommendations  SNF;Supervision/Assistance - 24 hour    Equipment Recommendations  None recommended by OT    Recommendations for Other Services       Precautions / Restrictions Precautions Precautions: Fall Restrictions Weight Bearing Restrictions: No      Mobility Bed Mobility Overal bed mobility: Needs Assistance Bed Mobility: Supine to Sit     Supine to sit: Max assist     General bed mobility comments: max assist to come to EOB due to inital letargy and decreased initiation  Transfers Overall transfer level: Needs assistance Equipment used: 2 person hand held assist Transfers: Sit to/from Stand Sit to Stand: Min assist;+2 physical assistance         General transfer comment: +2 min to power up to standing after task initiated by therapist. Bilateral therapist support provided    Balance Overall balance assessment: Needs assistance;History of Falls Sitting-balance support: Feet supported;Bilateral upper extremity  supported Sitting balance-Leahy Scale: Fair Sitting balance - Comments: Pt able to sit EOB initially with min assist but progressed to min guard with ability to maintain sitting at EOB with noted apporpriate corrective response. Unable to take challenge at this time   Standing balance support: During functional activity Standing balance-Leahy Scale: Poor Standing balance comment: Bilateral support in standing                            ADL either performed or assessed with clinical judgement   ADL Overall ADL's : Needs assistance/impaired     Grooming: Oral care;Wash/dry face;Sitting;Moderate assistance   Upper Body Bathing: Moderate assistance   Lower Body Bathing: Moderate assistance;Sitting/lateral leans   Upper Body Dressing : Moderate assistance;Sitting   Lower Body Dressing: Moderate assistance;Sitting/lateral leans   Toilet Transfer: Moderate assistance;+2 for physical assistance;Ambulation   Toileting- Clothing Manipulation and Hygiene: Moderate assistance;Sit to/from stand   Tub/ Banker: Moderate assistance;+2 for physical assistance;Ambulation   Functional mobility during ADLs: Moderate assistance;+2 for physical assistance General ADL Comments: Pt demonstrated appropriate use of grooming tools (wash cloth, tooth brush) with some  perseveration observed. Tactile cues provided to complete grooming tasks. Will further assess.      Vision         Perception     Praxis      Pertinent Vitals/Pain Pain Assessment: No/denies pain     Hand Dominance Right   Extremity/Trunk Assessment Upper Extremity Assessment Upper Extremity Assessment: Generalized weakness (Not formally assessed. Diffculty with grooming tasks)   Lower Extremity Assessment Lower Extremity Assessment: Defer to PT evaluation   Cervical / Trunk  Assessment Cervical / Trunk Assessment: Kyphotic   Communication Communication Communication: HOH   Cognition  Arousal/Alertness: Lethargic Behavior During Therapy: Restless;Agitated Overall Cognitive Status: History of cognitive impairments - at baseline Area of Impairment: Attention;Following commands;Safety/judgement;Awareness;Problem solving                   Current Attention Level: Sustained (Perservation with grooming task. )   Following Commands: Follows one step commands with increased time Safety/Judgement: Decreased awareness of safety Awareness: Intellectual Problem Solving: Decreased initiation;Requires verbal cues;Requires tactile cues General Comments: Pt with noted cognitive deficits, no family present during session. Pt required increased tactile/verbal cues for participation.    General Comments       Exercises     Shoulder Instructions      Home Living Family/patient expects to be discharged to:: Skilled nursing facility Living Arrangements: Children Available Help at Discharge: Family Type of Home: House                           Additional Comments: unable to obtain home setup information outside of chart review as no family present at bedside  Lives With: Daughter    Prior Functioning/Environment Level of Independence: Needs assistance  Gait / Transfers Assistance Needed: per chart, ambulated with RW and supervision ADL's / Homemaking Assistance Needed: No information in chart.            OT Problem List:        OT Treatment/Interventions:      OT Goals(Current goals can be found in the care plan section) Acute Rehab OT Goals Patient Stated Goal: none stated  OT Frequency:     Barriers to D/C:            Co-evaluation PT/OT/SLP Co-Evaluation/Treatment: Yes Reason for Co-Treatment: Complexity of the patient's impairments (multi-system involvement) PT goals addressed during session: Mobility/safety with mobility;Balance OT goals addressed during session: ADL's and self-care      AM-PAC PT "6 Clicks" Daily Activity      Outcome Measure Help from another person eating meals?: A Little Help from another person taking care of personal grooming?: A Little Help from another person toileting, which includes using toliet, bedpan, or urinal?: A Lot Help from another person bathing (including washing, rinsing, drying)?: A Lot Help from another person to put on and taking off regular upper body clothing?: A Lot Help from another person to put on and taking off regular lower body clothing?: A Lot 6 Click Score: 14   End of Session    Activity Tolerance:   Patient left:                     Time: 2836-6294 OT Time Calculation (min): 26 min Charges:    G-Codes:     Boykin Peek, OTS [phone]   Boykin Peek 11/11/2016, 2:13 PM

## 2016-11-11 NOTE — Evaluation (Signed)
Physical Therapy Evaluation Patient Details Name: [name] MRN: [id number] DOB: [birth year] Today's Date: 11/11/2016   History of Present Illness  patient is a 83 y.o. female with a past medical history significant for hypertension, hypothyroidism, hypercholesterolemia, depression, peripheral vascular disease, and some dementia who presents with 10 falls since Friday, head injury, skin tear on the right arm, diarrhea, confusion, and new slurred speec  Clinical Impression  Orders received for PT evaluation. Patient demonstrates deficits in functional mobility as indicated below. Will benefit from continued skilled PT to address deficits and maximize function. Will see as indicated and progress as tolerated.      Follow Up Recommendations SNF;Supervision/Assistance - 24 hour    Equipment Recommendations  None recommended by PT    Recommendations for Other Services       Precautions / Restrictions Precautions Precautions: Fall      Mobility  Bed Mobility Overal bed mobility: Needs Assistance Bed Mobility: Supine to Sit     Supine to sit: Max assist     General bed mobility comments: max assist to come to EOB due to inital letargy and decreased initiation  Transfers Overall transfer level: Needs assistance Equipment used: 2 person hand held assist Transfers: Sit to/from Stand Sit to Stand: Min assist;+2 physical assistance (wrap around support with gait belt)         General transfer comment: +2 min to power up to standing after task initiated by therapist. Bilateral therapist support provided  Ambulation/Gait Ambulation/Gait assistance: Mod assist;+2 physical assistance Ambulation Distance (Feet): 6 Feet Assistive device: 2 person hand held assist (wrap around support with gait belt) Gait Pattern/deviations: Step-to pattern;Decreased step length - left;Shuffle     General Gait Details: patient with noted instability and required multi modal cues to initiate  mobility and functional task performance for ambulation to chair.   Stairs            Wheelchair Mobility    Modified Rankin (Stroke Patients Only) Modified Rankin (Stroke Patients Only) Pre-Morbid Rankin Score: Moderate disability Modified Rankin: Moderately severe disability     Balance Overall balance assessment: Needs assistance;History of Falls Sitting-balance support: Feet supported;Bilateral upper extremity supported Sitting balance-Leahy Scale: Fair Sitting balance - Comments: able to sit EOB initially with min assist but progressed to min guard with ability to maintain sitting at EOB with noted apporpriate corrective response. Unable to take challenge at this time   Standing balance support: During functional activity Standing balance-Leahy Scale: Poor Standing balance comment: Bilateral support in standing                              Pertinent Vitals/Pain Pain Assessment: No/denies pain    Home Living Family/patient expects to be discharged to:: Skilled nursing facility Living Arrangements: Children Available Help at Discharge: Family Type of Home: House           Additional Comments: unable to obtain home setup information outside of chart review as no family present at bedside    Prior Function Level of Independence: Needs assistance   Gait / Transfers Assistance Needed: per chart, ambulated with RW and supervision           Hand Dominance   Dominant Hand: Right    Extremity/Trunk Assessment   Upper Extremity Assessment Upper Extremity Assessment: Defer to OT evaluation    Lower Extremity Assessment Lower Extremity Assessment: Generalized weakness (assessed during functional observation)    Cervical / Trunk  Assessment Cervical / Trunk Assessment: Kyphotic  Communication   Communication: HOH  Cognition Arousal/Alertness: Lethargic Behavior During Therapy: Restless (modest agitation at times) Overall Cognitive Status:  History of cognitive impairments - at baseline Area of Impairment: Attention;Following commands;Safety/judgement;Awareness;Problem solving                   Current Attention Level: Sustained (some perseveration at times)   Following Commands: Follows one step commands with increased time Safety/Judgement: Decreased awareness of safety Awareness: Intellectual Problem Solving: Decreased initiation;Requires verbal cues;Requires tactile cues General Comments: patient with noted cognitive deficits at baseline, no family present during session. Required increased cues to participate.      General Comments      Exercises     Assessment/Plan    PT Assessment Patient needs continued PT services  PT Problem List Decreased strength;Decreased activity tolerance;Decreased balance;Decreased mobility;Decreased cognition       PT Treatment Interventions DME instruction;Gait training;Functional mobility training;Therapeutic activities;Therapeutic exercise;Balance training;Neuromuscular re-education;Cognitive remediation;Patient/family education    PT Goals (Current goals can be found in the Care Plan section)  Acute Rehab PT Goals Patient Stated Goal: none stated PT Goal Formulation: With patient Time For Goal Achievement: 11/25/16 Potential to Achieve Goals: Fair    Frequency Min 2X/week   Barriers to discharge        Co-evaluation PT/OT/SLP Co-Evaluation/Treatment: Yes Reason for Co-Treatment: Complexity of the patient's impairments (multi-system involvement);For patient/therapist safety;Necessary to address cognition/behavior during functional activity PT goals addressed during session: Mobility/safety with mobility;Balance         AM-PAC PT "6 Clicks" Daily Activity  Outcome Measure Difficulty turning over in bed (including adjusting bedclothes, sheets and blankets)?: Unable Difficulty moving from lying on back to sitting on the side of the bed? : Unable Difficulty  sitting down on and standing up from a chair with arms (e.g., wheelchair, bedside commode, etc,.)?: Unable Help needed moving to and from a bed to chair (including a wheelchair)?: A Lot Help needed walking in hospital room?: A Lot Help needed climbing 3-5 steps with a railing? : Total 6 Click Score: 8    End of Session Equipment Utilized During Treatment: Gait belt Activity Tolerance: Patient limited by fatigue;Treatment limited secondary to agitation (patient with poor ability to keep eyes opened during session) Patient left: in chair;with call bell/phone within reach;with chair alarm set;with nursing/sitter in room Nurse Communication: Mobility status PT Visit Diagnosis: Unsteadiness on feet (R26.81);Muscle weakness (generalized) (M62.81);Other symptoms and signs involving the nervous system (R29.898)    Time: 9485-4627 PT Time Calculation (min) (ACUTE ONLY): 26 min   Charges:   PT Evaluation $PT Eval Moderate Complexity: 1 Mod     PT G Codes:        Alben Deeds, PT DPT  Board Certified Neurologic Specialist [place] 11/11/2016, 1:37 PM

## 2016-11-11 NOTE — Care Management Note (Signed)
Case Management Note  Patient Details  Name: [name] MRN: [id number] Date of Birth: June 12, 1931  Subjective/Objective:    Pt in with slurred speech. She is from home with her sister.                 Action/Plan: PT recommending SNF. Awaiting OT eval. CM following for d/c disposition.   Expected Discharge Date:                  Expected Discharge Plan:  Skilled Nursing Facility  In-House Referral:  Clinical Social Work  Discharge planning Services     Post Acute Care Choice:    Choice offered to:     DME Arranged:    DME Agency:     HH Arranged:    [place] Agency:     Status of Service:  In process, will continue to follow  If discussed at Long Length of Stay Meetings, dates discussed:    Additional Comments:  Pollie Friar, RN 11/11/2016, 1:46 PM

## 2016-11-11 NOTE — Progress Notes (Addendum)
Initial Nutrition Assessment  DOCUMENTATION CODES:   Severe malnutrition in context of chronic illness  INTERVENTION:    Ensure Enlive po BID, each supplement provides 350 kcal and 20 grams of protein  NUTRITION DIAGNOSIS:   Malnutrition (severe) related to chronic illness (dementia, depression) as evidenced by severe depletion of body fat, severe depletion of muscle mass  GOAL:   Patient will meet greater than or equal to 90% of their needs  MONITOR:   PO intake, Supplement acceptance, Labs, Weight trends, Skin, I & O's  REASON FOR ASSESSMENT:   Consult Assessment of nutrition requirement/status  ASSESSMENT:   83 y.o. Female with dementia, depression, HTN is brought to [facility] ED due for recurrent falls and slurred speech.   RD unable to obtain nutrition hx. Pt confused with eyes closed. Safety mittens on. Sitter at bedside. S/p bedside swallow evaluation. SLP rec Regular, thin liquids once pt fully alert. Labs and medications reviewed. K 2.7 (L). Ca 8.8 (L). CBG's 98-96.  Limited Nutrition-Focused physical exam completed.  Findings are severe fat depletion, severe muscle depletion, and no edema.   Diet Order:  Diet Heart Room service appropriate? Yes; Fluid consistency: Thin  Skin:  Reviewed, no issues  Last BM:  N/A  Height:   Ht Readings from Last 1 Encounters:  11/10/16 5\' 4"  (1.626 m)    Weight:   Wt Readings from Last 1 Encounters:  11/10/16 125 lb (56.7 kg)    Ideal Body Weight:  54.5 kg  BMI:  Body mass index is 21.46 kg/m.  Estimated Nutritional Needs:   Kcal:  1200-1400  Protein:  60-75 gm  Fluid:  >/= 1.5 L  EDUCATION NEEDS:   No education needs identified at this time  Arthur Holms, RD, LDN Pager #: [phone] After-Hours Pager #: [phone]

## 2016-11-12 ENCOUNTER — Inpatient Hospital Stay (HOSPITAL_COMMUNITY): Payer: Medicare HMO

## 2016-11-12 DIAGNOSIS — E7800 Pure hypercholesterolemia, unspecified: Secondary | ICD-10-CM

## 2016-11-12 DIAGNOSIS — I10 Essential (primary) hypertension: Secondary | ICD-10-CM

## 2016-11-12 DIAGNOSIS — G9340 Encephalopathy, unspecified: Secondary | ICD-10-CM

## 2016-11-12 LAB — VAS US CAROTID
LEFT ECA DIAS: -6 cm/s
LEFT VERTEBRAL DIAS: 11 cm/s
Left CCA dist dias: 9 cm/s
Left CCA dist sys: 42 cm/s
Left CCA prox dias: 8 cm/s
Left CCA prox sys: 51 cm/s
Left ICA dist dias: -19 cm/s
Left ICA dist sys: -81 cm/s
Left ICA prox dias: -13 cm/s
Left ICA prox sys: -57 cm/s
RIGHT ECA DIAS: -11 cm/s
RIGHT VERTEBRAL DIAS: -7 cm/s
Right CCA prox dias: -9 cm/s
Right CCA prox sys: -56 cm/s
Right cca dist sys: -55 cm/s

## 2016-11-12 LAB — BASIC METABOLIC PANEL
Anion gap: 8 (ref 5–15)
BUN: 9 mg/dL (ref 6–20)
CO2: 24 mmol/L (ref 22–32)
Calcium: 8.5 mg/dL — ABNORMAL LOW (ref 8.9–10.3)
Chloride: 106 mmol/L (ref 101–111)
Creatinine, Ser: 0.98 mg/dL (ref 0.44–1.00)
GFR calc Af Amer: >60 mL/min (ref >60)
GFR calc non Af Amer: 52 mL/min — ABNORMAL LOW (ref >60)
Glucose, Bld: 91 mg/dL (ref 65–99)
Potassium: 3.3 mmol/L — ABNORMAL LOW (ref 3.5–5.1)
Sodium: 138 mmol/L (ref 135–145)

## 2016-11-12 MED ORDER — LORAZEPAM 2 MG/ML IJ SOLN
1.0000 mg | Freq: Once | INTRAMUSCULAR | Status: AC
  Administered 2016-11-12: 1 mg via INTRAVENOUS
  Filled 2016-11-12: qty 1

## 2016-11-12 MED ORDER — POTASSIUM CHLORIDE CRYS ER 20 MEQ PO TBCR
40.0000 meq | EXTENDED_RELEASE_TABLET | Freq: Every day | ORAL | Status: DC
  Administered 2016-11-12 – 2016-11-14 (×2): 40 meq via ORAL
  Filled 2016-11-12 (×2): qty 2

## 2016-11-12 NOTE — Progress Notes (Signed)
  Speech Language Pathology Treatment: Cognitive-Linquistic;Dysphagia  Patient Details Name: OTHELL DILUZIO MRN: [id number] DOB: 19-Jul-1931 Today's Date: 11/12/2016 Time: 8891-6945 SLP Time Calculation (min) (ACUTE ONLY): 15 min  Assessment / Plan / Recommendation Clinical Impression  Pt was seen for skilled ST targeting dysphagia and cognition goals.   Pt was much clearer today in comparison to yesterday's evaluation.  Pt was independently oriented to hospital but needed max assist and choice of two to orient to situation and general time/season.  Pt needed max to total assist to recall basic, personally relevant information after brief (~1 minute) delays.  Pt had already eaten lunch upon therapist's arrival.  Safety sitter reports good toleration of current diet.  Pt was agreeable to consuming thin liquids via straw and demonstrated no overt s/s of aspiration, even when permitted to take large consecutive sips via straw.  Recommend that pt remain on her current diet.    HPI HPI: ANJULI GEMMILL a 83 y.o.femalewith a past medical history significant for hypertension, hypothyroidism, hypercholesterolemia, depression, peripheral vascular disease, and some dementia who presents with 10 falls since Friday, head injury, skin tear on the right arm, diarrhea, confusion, and new slurred speech.       SLP Plan  Continue with current plan of care       Recommendations  Diet recommendations: Regular;Thin liquid Liquids provided via: Cup;Straw Medication Administration: Whole meds with liquid Supervision: Patient able to self feed;Full supervision/cueing for compensatory strategies Compensations: Minimize environmental distractions;Slow rate;Small sips/bites Postural Changes and/or Swallow Maneuvers: Out of bed for meals;Upright 30-60 min after meal;Seated upright 90 degrees                Oral Care Recommendations: Oral care BID Follow up Recommendations: Skilled Nursing facility SLP Visit  Diagnosis: Cognitive communication deficit (R41.841);Dysphagia, unspecified (R13.10) Plan: Continue with current plan of care       GO                Jaxtyn Linville, Selinda Orion 11/12/2016, 2:24 PM

## 2016-11-12 NOTE — Clinical Social Work Note (Signed)
Clinical Social Work Assessment  Patient Details  Name: [name] MRN: [id number] Date of Birth: [birth year]  Date of referral:  11/12/16               Reason for consult:  Facility Placement                Permission sought to share information with:  Facility Sport and exercise psychologist, Family Supports Permission granted to share information::  Yes, Verbal Permission Granted  Name::     Acupuncturist::  SNF  Relationship::  Daughter  Contact Information:     Housing/Transportation Living arrangements for the past 2 months:  [place] of Information:  Patient, Adult Children Patient Interpreter Needed:  None Criminal Activity/Legal Involvement Pertinent to Current Situation/Hospitalization:  No - Comment as needed Significant Relationships:  Adult Children, Other Family Members Lives with:  Self, Adult Children Do you feel safe going back to the place where you live?  Yes Need for family participation in patient care:  Yes (Comment) (patient has dementia)  Care giving concerns:  Patient has been living at home with an adult daughter, but the daughter is unable to assist with providing care for the patient at this time; patient had been providing support for the adult daughter. Patient will benefit from short term rehab prior to returning home.    Social Worker assessment / plan:  CSW met with patient and patient's daughter at bedside to discuss discharge plan and recommendation for SNF. Patient's daughter indicated that the patient had been at [facility] in the past for a very short period before signing herself out to return home. CSW answered patient's daughter's questions and assured her that it would be short term; the medical team thought that the patient would be able to improve skills to be able to return home, this would not be a long term placement. CSW provided facility list to patient's daughter and will fax out referral.  Employment status:   Retired Insurance underwriter information:  Managed Medicare PT Recommendations:  [place] / Referral to community resources:     Patient/Family's Response to care:  Patient and daughter agreeable to SNF placement. Patient's response is limited due to her dementia, but she is aware that she's having trouble walking and she has been struggling with her memory.  Patient/Family's Understanding of and Emotional Response to Diagnosis, Current Treatment, and Prognosis:  Patient's daughter acknowledged that she is stressed, and is not handling her mother's current state of health very well. Patient's daughter admitted that she is not mentally ready to think about long term care at this point, she just can't handle even talking about it. Patient's daughter appeared overwhelmed as she was asking questions about medical information and asking about tests that were scheduled. Patient's daughter indicated understanding of CSW role in discharge planning and appreciated the assistance.  Emotional Assessment Appearance:  Appears stated age Attitude/Demeanor/Rapport:    Affect (typically observed):  Appropriate Orientation:  Oriented to Self, Oriented to Place Alcohol / Substance use:  Not Applicable Psych involvement (Current and /or in the community):  No (Comment)  Discharge Needs  Concerns to be addressed:  Care Coordination Readmission within the last 30 days:  No Current discharge risk:  Physical Impairment, Cognitively Impaired Barriers to Discharge:  Continued Medical Work up, Ship broker, Requiring sitter/restraints   Geralynn Ochs, [place] 11/12/2016, 4:27 PM

## 2016-11-12 NOTE — Progress Notes (Signed)
STROKE TEAM PROGRESS NOTE   SUBJECTIVE (INTERVAL HISTORY) [name] is at the bedside. Pt Initially sleeping, however easily arousable, awake alert and more interactive than yesterday. Following simple commands, still disorientated. No focal neurological deficit seen. Not able to do MRI due to agitation, we will repeat CT head. TTE showed all sedating density in LA, recommend TEE.   OBJECTIVE Temp:  [98.1 F (36.7 C)-99 F (37.2 C)] 98.5 F (36.9 C) (09/26 1302) Pulse Rate:  [72-88] 72 (09/26 1302) Cardiac Rhythm: Normal sinus rhythm (09/26 0700) Resp:  [16-20] 16 (09/26 1302) BP: (143-178)/(83-92) 144/89 (09/26 1302) SpO2:  [96 %-100 %] 99 % (09/26 1302)  CBC:   Recent Labs Lab 11/10/16 1309 11/10/16 2236 11/11/16 0725  WBC 7.0 7.9 7.1  NEUTROABS 4.8  --   --   HGB 12.2 12.2 13.4  HCT 34.6* 36.0 39.5  MCV 102.1* 102.3* 102.1*  PLT 204 206 676    Basic Metabolic Panel:   Recent Labs Lab 11/10/16 1309  11/11/16 1846 11/12/16 1121  NA 138  < > 135 138  K 3.2*  < > 3.1* 3.3*  CL 105  < > 102 106  CO2 24  < > 26 24  GLUCOSE 96  < > 129* 91  BUN 19  < > 10 9  CREATININE 0.91  < > 1.00 0.98  CALCIUM 8.8*  < > 9.0 8.5*  MG 1.9  --  2.0  --   < > = values in this interval not displayed.  Lipid Panel:     Component Value Date/Time   CHOL 156 11/11/2016 1846   TRIG 42 11/11/2016 1846   HDL 61 11/11/2016 1846   CHOLHDL 2.6 11/11/2016 1846   VLDL 8 11/11/2016 1846   LDLCALC 87 11/11/2016 1846   HgbA1c:  Lab Results  Component Value Date   HGBA1C 5.2 11/11/2016   Urine Drug Screen: No results found for: LABOPIA, COCAINSCRNUR, LABBENZ, AMPHETMU, THCU, LABBARB  Alcohol Level No results found for: [place] I have personally reviewed the radiological images below and agree with the radiology interpretations.  Dg Chest 2 View 11/10/2016 IMPRESSION: Chronic bronchitic changes. Subsegmental atelectasis in the lingula. No alveolar pneumonia nor CHF. Thoracic aortic  atherosclerosis. High-grade L1 compression fracture which which is unchanged since an abdominal CT scan of July 20, 2013.   Ct Head Wo Contrast Ct Cervical Spine Wo Contrast 11/10/2016 IMPRESSION: Moderate chronic ischemic white matter disease. Minimal diffuse cortical atrophy. Stable calcified meningioma seen in left posterior fossa. No acute intracranial abnormality seen. Severe multilevel degenerative disc disease. No acute abnormality seen in the cervical spine.   Carotid US 11/11/2016 Bilateral: 1-39% ICA stenosis. Vertebral artery flow is antegrade.  TTE 11/11/2016 - Vigorous LV systolic function; mild diastolic dysfunction;   sclerotic aortic valve with mild AI; oscillating density at LV   apex of uncertain etiology; suggest TEE to further assess.  CT head repeat pending  TEE pending   PHYSICAL EXAM  Temp:  [98.1 F (36.7 C)-99 F (37.2 C)] 98.5 F (36.9 C) (09/26 1302) Pulse Rate:  [72-88] 72 (09/26 1302) Resp:  [16-20] 16 (09/26 1302) BP: (143-178)/(83-92) 144/89 (09/26 1302) SpO2:  [96 %-100 %] 99 % (09/26 1302)  General - Well nourished, well developed, not in acute distress.  Ophthalmologic - Fundi not visualized due to noncooperation.  Cardiovascular - Regular rate and rhythm.  Neuro - awake, alert, not orientated to place, time or people. Able to follow simple commands. Paucity of speech  and moderate dysarthria. Able to name and repeat. Blinking bilaterally to visual threat, PERRL, no gaze deviation, attending to both sides. Facial symmetric, tongue is midline. Moving all extremities symmetrically, no significant focal weakness. DTR 1+ and no Babinski. Sensation, coordination not cooperative, and gait not tested.   ASSESSMENT/PLAN [name] is a 83 y.o. female with history of hypertension, hypothyroidism, hypercholesterolemia, depression, peripheral vascular disease, and some dementia who presents with multiple falls, head injury, skin tear on the right  arm, diarrhea, confusion, and new slurred speech. She did not receive IV t-PA due to arriving outside of the tPA treatment window.   Encephalopathy and agitation in the setting of advanced dementia  Resultant  improved mental status and dysarthria  CT head: no acute stroke  CT head repeat pending  Carotid Doppler unremarkable  2D Echo EF 65-70%, oscillating density at LV apex of uncertain etiology  Recommend TEE to rule out cardiac abnormality  LDL 73  HgbA1c 5.2  SCDs for VTE prophylaxis Diet Heart Room service appropriate? Yes; Fluid consistency: Thin  No antithrombotic prior to admission, now on aspirin 81 mg daily.  Ongoing aggressive stroke risk factor management  Therapy recommendations: SNF  Disposition: pending  Dementia  On Aricept and Namenda  Resultant agitation and confusion  Likely at baseline  Hypertension  Stable Long-term BP goal normotensive  Hyperlipidemia  Home meds: none  LDL 73, goal < 100  No need to introduce statin at this time  Other Stroke Risk Factors  Advanced age  Family hx stroke (father)  Other Active Problems  Hypokalemia 3.2 -> 2.7 ->3.3  Hospital day # 2  Gabrielle Hawking, MD PhD Stroke Neurology 11/12/2016 4:19 PM   To contact Stroke Continuity provider, please refer to [email or web address] After hours, contact General Neurology

## 2016-11-12 NOTE — Progress Notes (Signed)
Patient did well with sitters was able to redirect her, MRI called several times saying they were going to come get but never did except once and they did not call me in advance so I could medicate the patient, perhaps they will be able to get her down today.

## 2016-11-12 NOTE — Progress Notes (Addendum)
[facility]                                                                              Patient Demographics  [name], is a 83 y.o. female, DOB - 1931/08/23, XIP:[id number]  Admit date - 11/10/2016   Admitting Physician Gabrielle Reasons, MD  Outpatient Primary MD for the patient is [name] American, NP  Outpatient specialists:   LOS - 2  days   Medical records reviewed and are as summarized below:    Chief Complaint  Patient presents with  . Fall  . Weakness       Brief summary   HPI on 11/10/2016 by Dr. Oletha Ortiz Ortiz a 83 y.o.femaleWith h/o dementia (per daughter diagnosed 4-43yrs ago), depression, HTN is brought to [facility] ED With fall and weakness. Patient is oriented to person only, daughter at bedside helped with the history. Per daughter patient recently went to [place] for two weeks, returned to [facility] Thursday. Patient has not been taking her home meds regularly while she is in [place] visiting. On Friday am , patient had some nausea, and one episode of diarrhea. patient has multiple falls since Friday and her speech is slurred which is not her baseline. No fever, no cough. No abdominal pain. Daughter is not sure if Patient has had bm since Friday. Per chart review, there is h/o constipation.    Assessment & Plan    Principal Problem:   Slurred speech with acute metabolic encephalopathy - CT head showed moderate chronic ischemic white matter disease, minimal diffuse cortical atrophy, no acute intracranial abnormalities - MRI of the brain pending - CT C-spine showed severe multilevel degenerative disc disease, no acute abnormality - hemoglobin A1c 5.2, PTOT recommended skilled nursing facility - carotid Dopplers 1-39% ICA stenosis - 2-D echo showed EF of 67-34%, grade 1 diastolic dysfunction, oscillating density at the apex of uncertain etiology, suggest TEE. Cardiology consulted.  - continue aspirin 81 mg daily, statin if MRI is positive  for stroke  Active Problems:   HYPERCHOLESTEROLEMIA - LDL 87, statin if MRI + for acute CVA    Essential hypertension - Currently stable  Hypokalemia - Replaced    Multiple falls - PT evaluation recommended skilled nursing facility  Code Status: DO NOT RESUSCITATE DVT Prophylaxis:  Lovenox Family Communication: Discussed in detail with the patient, all imaging results, lab results explained to the patient   Disposition Plan:  SNF likely in am   Time Spent in minutes  25 minutes  Procedures:    Consultants:   Neurology  Antimicrobials:      Medications  Scheduled Meds: . amitriptyline  50 mg Oral QHS  . aspirin EC  81 mg Oral Daily  . donepezil  10 mg Oral QHS  . enoxaparin (LOVENOX) injection  40 mg Subcutaneous Q24H  . feeding supplement (ENSURE ENLIVE)  237 mL Oral BID BM  . LORazepam  1 mg Intravenous Once  . memantine  28 mg Oral QHS  . potassium chloride  40 mEq Oral Once  . senna-docusate  1 tablet Oral QHS  . venlafaxine XR  75 mg Oral Q breakfast  Continuous Infusions: . sodium chloride 75 mL/hr at 11/11/16 2256   PRN Meds:.hydrALAZINE   Antibiotics   Anti-infectives    None        Subjective:   [name] was seen and examined today.   Patient denies dizziness, chest pain, shortness of breath, abdominal pain, N/V/D/C, new weakness, numbess, tingling. No acute events overnight.    Objective:   Vitals:   11/12/16 0027 11/12/16 0520 11/12/16 0957 11/12/16 1302  BP: (!) 178/92 (!) 143/84 (!) 152/89 (!) 144/89  Pulse: 84 73 74 72  Resp: 20 18 18 16   Temp: 99 F (37.2 C) 98.7 F (37.1 C) 98.3 F (36.8 C) 98.5 F (36.9 C)  TempSrc: Oral Oral Oral Oral  SpO2: 99% 96% 97% 99%  Weight:      Height:        Intake/Output Summary (Last 24 hours) at 11/12/16 1334 Last data filed at 11/12/16 1200  Gross per 24 hour  Intake              480 ml  Output                0 ml  Net              480 ml     Wt Readings from Last 3  Encounters:  11/10/16 56.7 kg (125 lb)  05/13/16 55.9 kg (123 lb 3.2 oz)  03/03/16 58.7 kg (129 lb 6.4 oz)     Exam  General: Alert and oriented, NAD  Eyes:   HEENT:  Atraumatic, normocephalic  Cardiovascular: S1 S2 auscultated, no rubs, murmurs or gallops. Regular rate and rhythm.  Respiratory: Clear to auscultation bilaterally, no wheezing, rales or rhonchi  Gastrointestinal: Soft, nontender, nondistended, + bowel sounds  Ext: no pedal edema bilaterally  Neuro: no new deficits  Musculoskeletal: No digital cyanosis, clubbing  Skin: No rashes  Psych: Normal affect and demeanor, alert and oriented to self   Data Reviewed:  I have personally reviewed following labs and imaging studies  Micro Results Recent Results (from the past 240 hour(s))  Urine culture     Status: Abnormal   Collection Time: 11/10/16 12:58 PM  Result Value Ref Range Status   Specimen Description URINE, CLEAN CATCH  Final   Special Requests NONE  Final   Culture MULTIPLE SPECIES PRESENT, SUGGEST RECOLLECTION (A)  Final   Report Status 11/11/2016 FINAL  Final    Radiology Reports Dg Chest 2 View  Result Date: 11/10/2016 CLINICAL DATA:  Fatigue, falls, altered mental status. EXAM: CHEST  2 VIEW COMPARISON:  Chest x-ray dated February 16, 2013 FINDINGS: The lungs are adequately inflated. The interstitial markings are coarse. There are increased lung markings at the left base likely in the lingula. The heart is normal in size. The pulmonary vascularity is not engorged. The lung markings are increased in the left upper lobe. There is calcification in the wall of the aortic arch. The bony thorax exhibits no acute abnormality. There is a high-grade compression fracture of L1. IMPRESSION: Chronic bronchitic changes. Subsegmental atelectasis in the lingula. No alveolar pneumonia nor CHF. Thoracic aortic atherosclerosis. High-grade L1 compression fracture which which is unchanged since an abdominal CT scan of  July 20, 2013. Electronically Signed   By: David  Martinique M.D.   On: 11/10/2016 15:04   Ct Head Wo Contrast  Result Date: 11/10/2016 CLINICAL DATA:  Head injury after fall at home. EXAM: CT HEAD WITHOUT CONTRAST CT CERVICAL SPINE WITHOUT CONTRAST TECHNIQUE:  Multidetector CT imaging of the head and cervical spine was performed following the standard protocol without intravenous contrast. Multiplanar CT image reconstructions of the cervical spine were also generated. COMPARISON:  CT scan of December 19, 2014. FINDINGS: CT HEAD FINDINGS Brain: Moderate chronic ischemic white matter disease is noted. Minimal diffuse cortical atrophy is noted. Stable calcified meningioma is noted in left posterior fossa. There is no evidence of hemorrhage or acute infarction. No mass effect or midline shift is noted. Vascular: No hyperdense vessel or unexpected calcification. Skull: Normal. Negative for fracture or focal lesion. Sinuses/Orbits: No acute finding. Other: None. CT CERVICAL SPINE FINDINGS Alignment: Normal. Skull base and vertebrae: No acute fracture. No primary bone lesion or focal pathologic process. Soft tissues and spinal canal: No prevertebral fluid or swelling. No visible canal hematoma. Disc levels: Severe degenerative disc disease is noted at C3-4 and C5-6 with anterior osteophyte formation. Upper chest: Negative. Other: Levoscoliosis of cervical spine is noted. Degenerative changes seen involving the right-sided posterior facet joints. IMPRESSION: Moderate chronic ischemic white matter disease. Minimal diffuse cortical atrophy. Stable calcified meningioma seen in left posterior fossa. No acute intracranial abnormality seen. Severe multilevel degenerative disc disease. No acute abnormality seen in the cervical spine. Electronically Signed   By: Marijo Conception, M.D.   On: 11/10/2016 13:51   Ct Cervical Spine Wo Contrast  Result Date: 11/10/2016 CLINICAL DATA:  Head injury after fall at home. EXAM: CT HEAD  WITHOUT CONTRAST CT CERVICAL SPINE WITHOUT CONTRAST TECHNIQUE: Multidetector CT imaging of the head and cervical spine was performed following the standard protocol without intravenous contrast. Multiplanar CT image reconstructions of the cervical spine were also generated. COMPARISON:  CT scan of December 19, 2014. FINDINGS: CT HEAD FINDINGS Brain: Moderate chronic ischemic white matter disease is noted. Minimal diffuse cortical atrophy is noted. Stable calcified meningioma is noted in left posterior fossa. There is no evidence of hemorrhage or acute infarction. No mass effect or midline shift is noted. Vascular: No hyperdense vessel or unexpected calcification. Skull: Normal. Negative for fracture or focal lesion. Sinuses/Orbits: No acute finding. Other: None. CT CERVICAL SPINE FINDINGS Alignment: Normal. Skull base and vertebrae: No acute fracture. No primary bone lesion or focal pathologic process. Soft tissues and spinal canal: No prevertebral fluid or swelling. No visible canal hematoma. Disc levels: Severe degenerative disc disease is noted at C3-4 and C5-6 with anterior osteophyte formation. Upper chest: Negative. Other: Levoscoliosis of cervical spine is noted. Degenerative changes seen involving the right-sided posterior facet joints. IMPRESSION: Moderate chronic ischemic white matter disease. Minimal diffuse cortical atrophy. Stable calcified meningioma seen in left posterior fossa. No acute intracranial abnormality seen. Severe multilevel degenerative disc disease. No acute abnormality seen in the cervical spine. Electronically Signed   By: Marijo Conception, M.D.   On: 11/10/2016 13:51    Lab Data:  CBC:  Recent Labs Lab 11/10/16 1309 11/10/16 2236 11/11/16 0725  WBC 7.0 7.9 7.1  NEUTROABS 4.8  --   --   HGB 12.2 12.2 13.4  HCT 34.6* 36.0 39.5  MCV 102.1* 102.3* 102.1*  PLT 204 206 409   Basic Metabolic Panel:  Recent Labs Lab 11/10/16 1309 11/10/16 2236 11/11/16 0725  11/11/16 1846 11/12/16 1121  NA 138  --  137 135 138  K 3.2*  --  2.7* 3.1* 3.3*  CL 105  --  105 102 106  CO2 24  --  24 26 24   GLUCOSE 96  --  98 129* 91  BUN  19  --  12 10 9   CREATININE 0.91 1.09* 0.91 1.00 0.98  CALCIUM 8.8*  --  8.8* 9.0 8.5*  MG 1.9  --   --  2.0  --    GFR: Estimated Creatinine Clearance: 37.6 mL/min (by C-G formula based on SCr of 0.98 mg/dL). Liver Function Tests:  Recent Labs Lab 11/10/16 1309 11/11/16 0725 11/11/16 1846  AST 27 26 33  ALT 16 14 16   ALKPHOS 64 62 56  BILITOT 1.3* 1.2 1.1  PROT 6.6 6.4* 6.7  ALBUMIN 3.9 3.8 3.8    Recent Labs Lab 11/10/16 1309  LIPASE 28   No results for input(s): AMMONIA in the last 168 hours. Coagulation Profile:  Recent Labs Lab 11/10/16 1309  INR 1.07   Cardiac Enzymes: No results for input(s): CKTOTAL, CKMB, CKMBINDEX, TROPONINI in the last 168 hours. BNP (last 3 results) No results for input(s): PROBNP in the last 8760 hours. HbA1C:  Recent Labs  11/11/16 0725  HGBA1C 5.2   CBG: No results for input(s): GLUCAP in the last 168 hours. Lipid Profile:  Recent Labs  11/11/16 1846  CHOL 156  HDL 61  LDLCALC 87  TRIG 42  CHOLHDL 2.6   Thyroid Function Tests:  Recent Labs  11/10/16 1310 11/11/16 1846  TSH 5.808*  --   FREET4  --  0.90   Anemia Panel:  Recent Labs  11/11/16 1846  VITAMINB12 313  FOLATE 11.8   Urine analysis:    Component Value Date/Time   COLORURINE YELLOW 11/10/2016 1257   APPEARANCEUR CLEAR 11/10/2016 1257   LABSPEC 1.015 11/10/2016 1257   PHURINE 6.0 11/10/2016 1257   GLUCOSEU NEGATIVE 11/10/2016 1257   HGBUR SMALL (A) 11/10/2016 1257   BILIRUBINUR NEGATIVE 11/10/2016 1257   KETONESUR NEGATIVE 11/10/2016 1257   PROTEINUR NEGATIVE 11/10/2016 1257   UROBILINOGEN 0.2 12/19/2014 2002   NITRITE NEGATIVE 11/10/2016 1257   LEUKOCYTESUR NEGATIVE 11/10/2016 1257     Liyanna Cartwright M.D. [facility] 11/12/2016, 1:34 PM  Pager: [phone] Between  7am to 7pm - call Pager - [phone]  After 7pm go to [email or web address] - password [email or web address]  Call night coverage person covering after 7pm

## 2016-11-12 NOTE — NC FL2 (Signed)
[place] LEVEL OF CARE SCREENING TOOL     IDENTIFICATION  Patient Name: [name] Birthdate: [birth year] Sex: female Admission Date (Current Location): 11/10/2016  [facility] and [place] Number:  Herbalist and Address:  [facility], [place] [address], [place], [place] [address]      Provider Number: [id number]  Attending Physician Name and Address:  Mendel Corning, MD  Relative Name and Phone Number:       Current Level of Care: Hospital Recommended Level of Care: [place] Prior Approval Number:    Date Approved/Denied:   PASRR Number: [id number]  Discharge Plan: SNF    Current Diagnoses: Patient Active Problem List   Diagnosis Date Noted  . Multiple falls   . Encephalopathy   . Agitation   . Slurred speech 11/10/2016  . Vitamin D deficiency 03/07/2015  . Hx of fracture of vertebral column 12/28/2014  . Constipation 07/27/2013  . Fracture of vertebra 07/27/2013  . Loss of weight 07/13/2013  . Peripheral vascular disease, unspecified ([place]) 07/13/2013  . Epigastric mass 07/13/2013  . Dementia with behavioral disturbance   . Major depression ([place])   . Insomnia, unspecified   . Fall 09/07/2012  . Fracture of right distal radius 09/07/2012  . Hypothyroidism 08/18/2006  . HYPERCHOLESTEROLEMIA 08/18/2006  . Essential hypertension 08/18/2006  . Restless legs 08/18/2006    Orientation RESPIRATION BLADDER Height & Weight     Self, Place  Normal Continent, Incontinent (incontinent at times) Weight: 125 lb (56.7 kg) Height:  5\' 4"  (162.6 cm)  BEHAVIORAL SYMPTOMS/MOOD NEUROLOGICAL BOWEL NUTRITION STATUS      Continent Diet (heart healthy)  AMBULATORY STATUS COMMUNICATION OF NEEDS Skin   Extensive Assist Verbally Normal                       Personal Care Assistance Level of Assistance  Bathing, Dressing, Feeding Bathing Assistance: Maximum assistance Feeding assistance: Independent Dressing  Assistance: Maximum assistance     Functional Limitations Info             SPECIAL CARE FACTORS FREQUENCY  PT (By licensed PT), OT (By licensed OT)     PT Frequency: 5x/wk OT Frequency: 5x/wk            Contractures      Additional Factors Info  Code Status, Allergies, Psychotropic Code Status Info: DNR Allergies Info: NKA Psychotropic Info: Effexor-XR 75mg  daily with breakfast         Current Medications (11/12/2016):  This is the current hospital active medication list Current Facility-Administered Medications  Medication Dose Route Frequency Provider Last Rate Last Dose  . 0.9 %  sodium chloride infusion   Intravenous Continuous Florencia Reasons, MD 75 mL/hr at 11/11/16 2256    . amitriptyline (ELAVIL) tablet 50 mg  50 mg Oral QHS Florencia Reasons, MD   50 mg at 11/11/16 2106  . aspirin EC tablet 81 mg  81 mg Oral Daily Florencia Reasons, MD   81 mg at 11/11/16 1029  . donepezil (ARICEPT) tablet 10 mg  10 mg Oral QHS Florencia Reasons, MD   10 mg at 11/11/16 2106  . enoxaparin (LOVENOX) injection 40 mg  40 mg Subcutaneous Q24H Florencia Reasons, MD   40 mg at 11/11/16 2106  . feeding supplement (ENSURE ENLIVE) (ENSURE ENLIVE) liquid 237 mL  237 mL Oral BID BM Mikhail, Maryann, DO      . hydrALAZINE (APRESOLINE) injection 5 mg  5 mg Intravenous Q6H PRN Florencia Reasons, MD      . LORazepam (ATIVAN) injection 1 mg  1 mg Intravenous Once Cristal Ford, DO      . memantine (NAMENDA XR) 24 hr capsule 28 mg  28 mg Oral Vonzell Schlatter, MD   28 mg at 11/11/16 2109  . potassium chloride 20 MEQ/15ML (10%) solution 40 mEq  40 mEq Oral Once Florencia Reasons, MD      . senna-docusate (Senokot-S) tablet 1 tablet  1 tablet Oral Vonzell Schlatter, MD   1 tablet at 11/11/16 2106  . venlafaxine XR (EFFEXOR-XR) 24 hr capsule 75 mg  75 mg Oral Q breakfast Florencia Reasons, MD   75 mg at 11/11/16 1028     Discharge Medications: Please see discharge summary for a list of discharge medications.  Relevant Imaging Results:  Relevant Lab  Results:   Additional Information SS#: [id number]  Geralynn Ochs, LCSW

## 2016-11-12 NOTE — Progress Notes (Signed)
    [facility] has been requested to perform a transesophageal echocardiogram on 11/13/16 for CVA.  After careful review of history and examination, the risks and benefits of transesophageal echocardiogram have been explained including risks of esophageal damage, perforation (1:10,000 risk), bleeding, pharyngeal hematoma as well as other potential complications associated with conscious sedation including aspiration, arrhythmia, respiratory failure and death. Alternatives to treatment were discussed, questions were answered. Everything has been the patient's daugther at the bedside. Patient/daughter is willing to proceed. Patient's daughter signed the consent form.   Labs/vital signs stable.  Reino Bellis, NP-C 11/12/2016 5:47 PM

## 2016-11-13 ENCOUNTER — Encounter (HOSPITAL_COMMUNITY)
Admission: EM | Disposition: A | Discharge status: Skilled Nursing Facility | Payer: Self-pay | Source: Home / Self Care | Attending: Internal Medicine

## 2016-11-13 ENCOUNTER — Inpatient Hospital Stay (HOSPITAL_COMMUNITY): Payer: Medicare HMO

## 2016-11-13 ENCOUNTER — Encounter (HOSPITAL_COMMUNITY): Payer: Self-pay | Admitting: *Deleted

## 2016-11-13 DIAGNOSIS — I340 Nonrheumatic mitral (valve) insufficiency: Secondary | ICD-10-CM

## 2016-11-13 DIAGNOSIS — I351 Nonrheumatic aortic (valve) insufficiency: Secondary | ICD-10-CM

## 2016-11-13 DIAGNOSIS — E43 Unspecified severe protein-calorie malnutrition: Secondary | ICD-10-CM | POA: Insufficient documentation

## 2016-11-13 HISTORY — PX: TEE WITHOUT CARDIOVERSION: SHX5443

## 2016-11-13 LAB — BASIC METABOLIC PANEL
Anion gap: 8 (ref 5–15)
BUN: 13 mg/dL (ref 6–20)
CO2: 24 mmol/L (ref 22–32)
Calcium: 8.9 mg/dL (ref 8.9–10.3)
Chloride: 106 mmol/L (ref 101–111)
Creatinine, Ser: 0.98 mg/dL (ref 0.44–1.00)
GFR calc Af Amer: >60 mL/min (ref >60)
GFR calc non Af Amer: 52 mL/min — ABNORMAL LOW (ref >60)
Glucose, Bld: 84 mg/dL (ref 65–99)
Potassium: 3.5 mmol/L (ref 3.5–5.1)
Sodium: 138 mmol/L (ref 135–145)

## 2016-11-13 SURGERY — ECHOCARDIOGRAM, TRANSESOPHAGEAL
Anesthesia: Moderate Sedation

## 2016-11-13 MED ORDER — FENTANYL CITRATE (PF) 100 MCG/2ML IJ SOLN
INTRAMUSCULAR | Status: AC
  Filled 2016-11-13: qty 2

## 2016-11-13 MED ORDER — MIDAZOLAM HCL 10 MG/2ML IJ SOLN
INTRAMUSCULAR | Status: DC | PRN
Start: 2016-11-13 — End: 2016-11-13
  Administered 2016-11-13 (×2): 1 mg via INTRAVENOUS

## 2016-11-13 MED ORDER — SODIUM CHLORIDE 0.9 % IV SOLN: INTRAVENOUS | Status: DC

## 2016-11-13 MED ORDER — FENTANYL CITRATE (PF) 100 MCG/2ML IJ SOLN
INTRAMUSCULAR | Status: DC | PRN
  Administered 2016-11-13: 25 ug via INTRAVENOUS

## 2016-11-13 MED ORDER — MIDAZOLAM HCL 5 MG/ML IJ SOLN
INTRAMUSCULAR | Status: AC
  Filled 2016-11-13: qty 1

## 2016-11-13 MED ORDER — HALOPERIDOL LACTATE 5 MG/ML IJ SOLN
0.5000 mg | Freq: Four times a day (QID) | INTRAMUSCULAR | Status: DC | PRN
  Administered 2016-11-13: 0.5 mg via INTRAVENOUS
  Filled 2016-11-13: qty 1

## 2016-11-13 MED ORDER — HYDRALAZINE HCL 20 MG/ML IJ SOLN
INTRAMUSCULAR | Status: AC
  Filled 2016-11-13: qty 1

## 2016-11-13 NOTE — Progress Notes (Signed)
[facility]                                                                              Patient Demographics  [name], is a 83 y.o. female, DOB - [birth year], UDJ:[id number]  Admit date - 11/10/2016   Admitting Physician Florencia Reasons, MD  Outpatient Primary MD for the patient is Dewaine Oats Carlos American, NP  Outpatient specialists:   LOS - 3  days   Medical records reviewed and are as summarized below:    Chief Complaint  Patient presents with  . Fall  . Weakness       Brief summary   HPI on 11/10/2016 by Dr. Oletha Blend Hawksis a 83 y.o.femaleWith h/o dementia (per daughter diagnosed 4-74yrs ago), depression, HTN is brought to [facility] ED With fall and weakness. Patient is oriented to person only, daughter at bedside helped with the history. Per daughter patient recently went to [place] for two weeks, returned to [facility] Thursday. Patient has not been taking her home meds regularly while she is in [place] visiting. On Friday am , patient had some nausea, and one episode of diarrhea. patient has multiple falls since Friday and her speech is slurred which is not her baseline. No fever, no cough. No abdominal pain. Daughter is not sure if Patient has had bm since Friday. Per chart review, there is h/o constipation.    Assessment & Plan    Principal Problem:   Slurred speech with acute metabolic encephalopathy, in the setting of advanced dementia - CT head showed moderate chronic ischemic white matter disease, minimal diffuse cortical atrophy, no acute intracranial abnormalities - CT C-spine showed severe multilevel degenerative disc disease, no acute abnormality - hemoglobin A1c 5.2, PTOT recommended skilled nursing facility - carotid Dopplers 1-39% ICA stenosis - 2-D echo showed EF of 58-85%, grade 1 diastolic dysfunction, oscillating density at the apex of uncertain etiology, suggest TEE. Cardiology consulted. TEE planned today - continue aspirin 81 mg daily -  repeat CT head on 9/26 showed no acute intracranial process, stable moderate to severe chronic small vessel ischemic disease, advanced mesial temporal lobe volume loss associated with neurodegenerative disease  Active Problems:   HYPERCHOLESTEROLEMIA - LDL 87, per neuro, no need to introduce statin at this time     Essential hypertension - Currently stable  Hypokalemia - resolved    Multiple falls - PT evaluation recommended skilled nursing facility  Advanced dementia with sundowning - avoid Ativan, patient has significant sedating effect, place on low-dose Haldol - Continue Aricept  Code Status: DO NOT RESUSCITATE DVT Prophylaxis:  Lovenox Family Communication: Discussed in detail with the patient, all imaging results, lab results explained to the patient's daughter in detail yesterday   Disposition Plan:  SNF likely in am   Time Spent in minutes  25 minutes  Procedures:    Consultants:   Neurology  Antimicrobials:      Medications  Scheduled Meds: . [MAR Hold] amitriptyline  50 mg Oral QHS  . [MAR Hold] aspirin EC  81 mg Oral Daily  . [MAR Hold] donepezil  10 mg Oral QHS  . [MAR Hold] enoxaparin (LOVENOX)  injection  40 mg Subcutaneous Q24H  . [MAR Hold] feeding supplement (ENSURE ENLIVE)  237 mL Oral BID BM  . [MAR Hold] memantine  28 mg Oral QHS  . [MAR Hold] potassium chloride  40 mEq Oral Once  . [MAR Hold] potassium chloride  40 mEq Oral Daily  . [MAR Hold] senna-docusate  1 tablet Oral QHS  . [MAR Hold] venlafaxine XR  75 mg Oral Q breakfast   Continuous Infusions:  PRN Meds:.[MAR Hold] hydrALAZINE   Antibiotics   Anti-infectives    None        Subjective:   Tyreona Panjwani was seen and examined today.  Somnolent with Ativan received last night due to agitation. Patient denies dizziness, chest pain, shortness of breath, abdominal pain, N/V/D/C, new weakness, numbess, tingling.    Objective:   Vitals:   11/13/16 1435 11/13/16 1440 11/13/16  1444 11/13/16 1455  BP: (!) 204/98  (!) 209/99   Pulse: 92 87 81 78  Resp: (!) 21 16 13 17   Temp:   98 F (36.7 C)   TempSrc:   Oral   SpO2: 100% 99% 98%   Weight:      Height:        Intake/Output Summary (Last 24 hours) at 11/13/16 1503 Last data filed at 11/12/16 1900  Gross per 24 hour  Intake           2527.5 ml  Output                0 ml  Net           2527.5 ml     Wt Readings from Last 3 Encounters:  11/10/16 56.7 kg (125 lb)  05/13/16 55.9 kg (123 lb 3.2 oz)  03/03/16 58.7 kg (129 lb 6.4 oz)     Exam General: sedated, NAD Eyes: PERRLA, EOMI HEENT:  Atraumatic, normocephalic Cardiovascular: S1 S2 auscultated, RRR Respiratory: Clear to auscultation bilaterally, no wheezing, rales or rhonchi Gastrointestinal: Soft, nontender, nondistended, + bowel sounds Ext: no pedal edema bilaterally Neuro:somnolent Musculoskeletal: No digital cyanosis, clubbing Skin: No rashes Psych: somnolent      Data Reviewed:  I have personally reviewed following labs and imaging studies  Micro Results Recent Results (from the past 240 hour(s))  Urine culture     Status: Abnormal   Collection Time: 11/10/16 12:58 PM  Result Value Ref Range Status   Specimen Description URINE, CLEAN CATCH  Final   Special Requests NONE  Final   Culture MULTIPLE SPECIES PRESENT, SUGGEST RECOLLECTION (A)  Final   Report Status 11/11/2016 FINAL  Final    Radiology Reports Dg Chest 2 View  Result Date: 11/10/2016 CLINICAL DATA:  Fatigue, falls, altered mental status. EXAM: CHEST  2 VIEW COMPARISON:  Chest x-ray dated February 16, 2013 FINDINGS: The lungs are adequately inflated. The interstitial markings are coarse. There are increased lung markings at the left base likely in the lingula. The heart is normal in size. The pulmonary vascularity is not engorged. The lung markings are increased in the left upper lobe. There is calcification in the wall of the aortic arch. The bony thorax exhibits no  acute abnormality. There is a high-grade compression fracture of L1. IMPRESSION: Chronic bronchitic changes. Subsegmental atelectasis in the lingula. No alveolar pneumonia nor CHF. Thoracic aortic atherosclerosis. High-grade L1 compression fracture which which is unchanged since an abdominal CT scan of July 20, 2013. Electronically Signed   By: David  Martinique M.D.   On: 11/10/2016 15:04  Ct Head Wo Contrast  Result Date: 11/12/2016 CLINICAL DATA:  Follow-up stroke. History of Alzheimer's disease, hypertension. EXAM: CT HEAD WITHOUT CONTRAST TECHNIQUE: Contiguous axial images were obtained from the base of the skull through the vertex without intravenous contrast. COMPARISON:  CT HEAD November 10, 2016 FINDINGS: BRAIN: No intraparenchymal hemorrhage, mass effect nor midline shift. Mild mesial parenchymal brain volume loss for age, no hydrocephalus. Confluent supratentorial and patchy pons white matter hypodensities. No acute large vascular territory infarcts. No abnormal extra-axial fluid collections. 11 mm LEFT posterior fossa calcified meningioma without mass effect. Basal cisterns are patent. VASCULAR: Mild calcific atherosclerosis of the carotid siphons. SKULL: No skull fracture. Moderate LEFT greater than RIGHT temporomandibular osteoarthrosis. No significant scalp soft tissue swelling. SINUSES/ORBITS: The mastoid air-cells and included paranasal sinuses are well-aerated.The included ocular globes and orbital contents are non-suspicious. Status post bilateral ocular lens implants. OTHER: None. IMPRESSION: 1. No acute intracranial process. 2. Stable moderate to severe chronic small vessel ischemic disease. 3. Advanced mesial temporal lobe volume loss associated with neurodegenerative disease. Electronically Signed   By: Elon Alas M.D.   On: 11/12/2016 19:45   Ct Head Wo Contrast  Result Date: 11/10/2016 CLINICAL DATA:  Head injury after fall at home. EXAM: CT HEAD WITHOUT CONTRAST CT CERVICAL  SPINE WITHOUT CONTRAST TECHNIQUE: Multidetector CT imaging of the head and cervical spine was performed following the standard protocol without intravenous contrast. Multiplanar CT image reconstructions of the cervical spine were also generated. COMPARISON:  CT scan of December 19, 2014. FINDINGS: CT HEAD FINDINGS Brain: Moderate chronic ischemic white matter disease is noted. Minimal diffuse cortical atrophy is noted. Stable calcified meningioma is noted in left posterior fossa. There is no evidence of hemorrhage or acute infarction. No mass effect or midline shift is noted. Vascular: No hyperdense vessel or unexpected calcification. Skull: Normal. Negative for fracture or focal lesion. Sinuses/Orbits: No acute finding. Other: None. CT CERVICAL SPINE FINDINGS Alignment: Normal. Skull base and vertebrae: No acute fracture. No primary bone lesion or focal pathologic process. Soft tissues and spinal canal: No prevertebral fluid or swelling. No visible canal hematoma. Disc levels: Severe degenerative disc disease is noted at C3-4 and C5-6 with anterior osteophyte formation. Upper chest: Negative. Other: Levoscoliosis of cervical spine is noted. Degenerative changes seen involving the right-sided posterior facet joints. IMPRESSION: Moderate chronic ischemic white matter disease. Minimal diffuse cortical atrophy. Stable calcified meningioma seen in left posterior fossa. No acute intracranial abnormality seen. Severe multilevel degenerative disc disease. No acute abnormality seen in the cervical spine. Electronically Signed   By: Marijo Conception, M.D.   On: 11/10/2016 13:51   Ct Cervical Spine Wo Contrast  Result Date: 11/10/2016 CLINICAL DATA:  Head injury after fall at home. EXAM: CT HEAD WITHOUT CONTRAST CT CERVICAL SPINE WITHOUT CONTRAST TECHNIQUE: Multidetector CT imaging of the head and cervical spine was performed following the standard protocol without intravenous contrast. Multiplanar CT image reconstructions  of the cervical spine were also generated. COMPARISON:  CT scan of December 19, 2014. FINDINGS: CT HEAD FINDINGS Brain: Moderate chronic ischemic white matter disease is noted. Minimal diffuse cortical atrophy is noted. Stable calcified meningioma is noted in left posterior fossa. There is no evidence of hemorrhage or acute infarction. No mass effect or midline shift is noted. Vascular: No hyperdense vessel or unexpected calcification. Skull: Normal. Negative for fracture or focal lesion. Sinuses/Orbits: No acute finding. Other: None. CT CERVICAL SPINE FINDINGS Alignment: Normal. Skull base and vertebrae: No acute fracture. No primary bone  lesion or focal pathologic process. Soft tissues and spinal canal: No prevertebral fluid or swelling. No visible canal hematoma. Disc levels: Severe degenerative disc disease is noted at C3-4 and C5-6 with anterior osteophyte formation. Upper chest: Negative. Other: Levoscoliosis of cervical spine is noted. Degenerative changes seen involving the right-sided posterior facet joints. IMPRESSION: Moderate chronic ischemic white matter disease. Minimal diffuse cortical atrophy. Stable calcified meningioma seen in left posterior fossa. No acute intracranial abnormality seen. Severe multilevel degenerative disc disease. No acute abnormality seen in the cervical spine. Electronically Signed   By: Marijo Conception, M.D.   On: 11/10/2016 13:51    Lab Data:  CBC:  Recent Labs Lab 11/10/16 1309 11/10/16 2236 11/11/16 0725  WBC 7.0 7.9 7.1  NEUTROABS 4.8  --   --   HGB 12.2 12.2 13.4  HCT 34.6* 36.0 39.5  MCV 102.1* 102.3* 102.1*  PLT 204 206 998   Basic Metabolic Panel:  Recent Labs Lab 11/10/16 1309 11/10/16 2236 11/11/16 0725 11/11/16 1846 11/12/16 1121 11/13/16 0544  NA 138  --  137 135 138 138  K 3.2*  --  2.7* 3.1* 3.3* 3.5  CL 105  --  105 102 106 106  CO2 24  --  24 26 24 24   GLUCOSE 96  --  98 129* 91 84  BUN 19  --  12 10 9 13   CREATININE 0.91 1.09*  0.91 1.00 0.98 0.98  CALCIUM 8.8*  --  8.8* 9.0 8.5* 8.9  MG 1.9  --   --  2.0  --   --    GFR: Estimated Creatinine Clearance: 37.6 mL/min (by C-G formula based on SCr of 0.98 mg/dL). Liver Function Tests:  Recent Labs Lab 11/10/16 1309 11/11/16 0725 11/11/16 1846  AST 27 26 33  ALT 16 14 16   ALKPHOS 64 62 56  BILITOT 1.3* 1.2 1.1  PROT 6.6 6.4* 6.7  ALBUMIN 3.9 3.8 3.8    Recent Labs Lab 11/10/16 1309  LIPASE 28   No results for input(s): AMMONIA in the last 168 hours. Coagulation Profile:  Recent Labs Lab 11/10/16 1309  INR 1.07   Cardiac Enzymes: No results for input(s): CKTOTAL, CKMB, CKMBINDEX, TROPONINI in the last 168 hours. BNP (last 3 results) No results for input(s): PROBNP in the last 8760 hours. HbA1C:  Recent Labs  11/11/16 0725  HGBA1C 5.2   CBG: No results for input(s): GLUCAP in the last 168 hours. Lipid Profile:  Recent Labs  11/11/16 1846  CHOL 156  HDL 61  LDLCALC 87  TRIG 42  CHOLHDL 2.6   Thyroid Function Tests:  Recent Labs  11/11/16 1846  FREET4 0.90   Anemia Panel:  Recent Labs  11/11/16 1846  VITAMINB12 313  FOLATE 11.8   Urine analysis:    Component Value Date/Time   COLORURINE YELLOW 11/10/2016 1257   APPEARANCEUR CLEAR 11/10/2016 1257   LABSPEC 1.015 11/10/2016 1257   PHURINE 6.0 11/10/2016 1257   GLUCOSEU NEGATIVE 11/10/2016 1257   HGBUR SMALL (A) 11/10/2016 1257   BILIRUBINUR NEGATIVE 11/10/2016 1257   KETONESUR NEGATIVE 11/10/2016 1257   PROTEINUR NEGATIVE 11/10/2016 1257   UROBILINOGEN 0.2 12/19/2014 2002   NITRITE NEGATIVE 11/10/2016 1257   LEUKOCYTESUR NEGATIVE 11/10/2016 1257     Brihany Butch M.D. [facility] 11/13/2016, 3:03 PM  Pager: [phone] Between 7am to 7pm - call Pager - [phone]  After 7pm go to [email or web address] - password [email or web address]  Call night coverage person covering after 7pm

## 2016-11-13 NOTE — Progress Notes (Signed)
  Echocardiogram Echocardiogram Transesophageal has been performed.  [name] L Androw 11/13/2016, 2:51 PM

## 2016-11-13 NOTE — Progress Notes (Signed)
STROKE TEAM PROGRESS NOTE   SUBJECTIVE (INTERVAL HISTORY) No family is at the bedside. Patient is sitting in chair, lethargic, able to arouse with voice. Pending TEE. Repeat CT last night no acute abnormality.   OBJECTIVE Temp:  [97.6 F (36.4 C)-98.6 F (37 C)] 97.7 F (36.5 C) (09/27 1532) Pulse Rate:  [57-97] 57 (09/27 1652) Cardiac Rhythm: Normal sinus rhythm (09/27 0700) Resp:  [13-24] 16 (09/27 1532) BP: (127-237)/(73-159) 171/109 (09/27 1652) SpO2:  [93 %-100 %] 98 % (09/27 1652)  CBC:   Recent Labs Lab 11/10/16 1309 11/10/16 2236 11/11/16 0725  WBC 7.0 7.9 7.1  NEUTROABS 4.8  --   --   HGB 12.2 12.2 13.4  HCT 34.6* 36.0 39.5  MCV 102.1* 102.3* 102.1*  PLT 204 206 093    Basic Metabolic Panel:   Recent Labs Lab 11/10/16 1309  11/11/16 1846 11/12/16 1121 11/13/16 0544  NA 138  < > 135 138 138  K 3.2*  < > 3.1* 3.3* 3.5  CL 105  < > 102 106 106  CO2 24  < > 26 24 24   GLUCOSE 96  < > 129* 91 84  BUN 19  < > 10 9 13   CREATININE 0.91  < > 1.00 0.98 0.98  CALCIUM 8.8*  < > 9.0 8.5* 8.9  MG 1.9  --  2.0  --   --   < > = values in this interval not displayed.  Lipid Panel:     Component Value Date/Time   CHOL 156 11/11/2016 1846   TRIG 42 11/11/2016 1846   HDL 61 11/11/2016 1846   CHOLHDL 2.6 11/11/2016 1846   VLDL 8 11/11/2016 1846   LDLCALC 87 11/11/2016 1846   HgbA1c:  Lab Results  Component Value Date   HGBA1C 5.2 11/11/2016   Urine Drug Screen: No results found for: LABOPIA, COCAINSCRNUR, LABBENZ, AMPHETMU, THCU, LABBARB  Alcohol Level No results found for: [place] I have personally reviewed the radiological images below and agree with the radiology interpretations.  Dg Chest 2 View 11/10/2016 IMPRESSION: Chronic bronchitic changes. Subsegmental atelectasis in the lingula. No alveolar pneumonia nor CHF. Thoracic aortic atherosclerosis. High-grade L1 compression fracture which which is unchanged since an abdominal CT scan of July 20, 2013.   Ct Head Wo Contrast Ct Cervical Spine Wo Contrast 11/10/2016 IMPRESSION: Moderate chronic ischemic white matter disease. Minimal diffuse cortical atrophy. Stable calcified meningioma seen in left posterior fossa. No acute intracranial abnormality seen. Severe multilevel degenerative disc disease. No acute abnormality seen in the cervical spine.   Carotid US 11/11/2016 Bilateral: 1-39% ICA stenosis. Vertebral artery flow is antegrade.  TTE 11/11/2016 - Vigorous LV systolic function; mild diastolic dysfunction;   sclerotic aortic valve with mild AI; oscillating density at LV   apex of uncertain etiology; suggest TEE to further assess.  Ct Head Wo Contrast  11/12/2016 IMPRESSION: 1. No acute intracranial process. 2. Stable moderate to severe chronic small vessel ischemic disease. 3. Advanced mesial temporal lobe volume loss associated with neurodegenerative disease.   TEE  - no evidence of LV thrombus or mass.   - normal EF  - normal bubble study   PHYSICAL EXAM  Temp:  [97.6 F (36.4 C)-98.6 F (37 C)] 97.7 F (36.5 C) (09/27 1532) Pulse Rate:  [57-97] 57 (09/27 1652) Resp:  [13-24] 16 (09/27 1532) BP: (127-237)/(73-159) 171/109 (09/27 1652) SpO2:  [93 %-100 %] 98 % (09/27 1652)  General - Well nourished, well developed, not in acute  distress.  Ophthalmologic - Fundi not visualized due to noncooperation.  Cardiovascular - Regular rate and rhythm.  Neuro - sleepy, easily arousable with voice, not orientated to place, time or people. Able to follow simple commands. Paucity of speech and moderate dysarthria. Able to name and repeat. Blinking bilaterally to visual threat, PERRL, no gaze deviation, attending to both sides. Facial symmetric, tongue is midline. Moving all extremities symmetrically, no significant focal weakness. DTR 1+ and no Babinski. Sensation, coordination not cooperative, and gait not tested.   ASSESSMENT/PLAN [name] is a 83 y.o. female with  history of hypertension, hypothyroidism, hypercholesterolemia, depression, peripheral vascular disease, and some dementia who presents with multiple falls, head injury, skin tear on the right arm, diarrhea, confusion, and new slurred speech. She did not receive IV t-PA due to arriving outside of the tPA treatment window.   Encephalopathy and agitation in the setting of advanced dementia  Resultant  improved mental status and dysarthria  CT head: no acute stroke  CT head repeat no acute abnormality  Carotid Doppler unremarkable  2D Echo EF 65-70%, oscillating density at LV apex of uncertain etiology  TTE unremarkable, no LV thrombus or mass  LDL 73  HgbA1c 5.2  SCDs for VTE prophylaxis Diet NPO time specified  No antithrombotic prior to admission, now on aspirin 81 mg daily.  Ongoing aggressive stroke risk factor management  Therapy recommendations: SNF  Disposition: pending  Dementia  On Aricept and Namenda  Resultant agitation and confusion  Likely at baseline  Hypertension  Stable Long-term BP goal normotensive  Hyperlipidemia  Home meds: none  LDL 73, goal < 100  No need to introduce statin at this time  Other Stroke Risk Factors  Advanced age  Family hx stroke (father)  Other Active Problems  Hypokalemia 3.2 -> 2.7 ->3.3  Hospital day # 3  Neurology will sign off. Please call with questions. No neuro follow-up needed at this time. Thanks for the consult.  Rosalin Hawking, MD PhD Stroke Neurology 11/13/2016 5:11 PM   To contact Stroke Continuity provider, please refer to [email or web address] After hours, contact General Neurology

## 2016-11-13 NOTE — CV Procedure (Signed)
   TEE  Indication: stroke, ?LV mass  Time out performed.  Daughter called, discussed with Dr. Tana Coast as well. Wishing to proceed with TEE.   During this procedure the patient is administered a total of Versed 2 mg and Fentanyl 25 mcg to achieve and maintain moderate conscious sedation.  The patient's heart rate, blood pressure, and oxygen saturation are monitored continuously during the procedure. The period of conscious sedation is 20 minutes, of which I was present face-to-face 100% of this time.  Findings:   - no evidence of LV thrombus or mass.   - normal EF  - normal bubble study  Findings discussed with patient's daughter. Reassuring TEE.  Candee Furbish, MD

## 2016-11-13 NOTE — Progress Notes (Signed)
Physical Therapy Treatment Patient Details Name: [name] MRN: [id number] DOB: 1931/11/23 Today's Date: 11/13/2016    History of Present Illness Pt is a 83 y.o. female who presented to [facility] with a head injury from a fall, skin tear to right arm, diarrhea, confusion, and new slurred speech. Pt with 10 fall since Friday. PMH significant for hypertension, hypothyroidism, hypercholesterolemia, depression, peripheral vascular disease, and dementia.     PT Comments    Pt continues to require two person physical assistance for all functional mobility. Pt limited this session secondary to lethargy and inability to keep her eyes open for longer than five seconds at a time. Pt would continue to benefit from skilled physical therapy services at this time while admitted and after d/c to address the below listed limitations in order to improve overall safety and independence with functional mobility.    Follow Up Recommendations  SNF;Supervision/Assistance - 24 hour     Equipment Recommendations  None recommended by PT    Recommendations for Other Services       Precautions / Restrictions Precautions Precautions: Fall Restrictions Weight Bearing Restrictions: No    Mobility  Bed Mobility Overal bed mobility: Needs Assistance Bed Mobility: Supine to Sit     Supine to sit: Mod assist     General bed mobility comments: assist with bilateral LEs and to elevate trunk with use of bed pads to position pt's hips at EOB  Transfers Overall transfer level: Needs assistance Equipment used: 2 person hand held assist Transfers: Sit to/from [facility] Sit to Stand: Mod assist;+2 physical assistance Stand pivot transfers: Max assist;+2 physical assistance       General transfer comment: increased time, cueing for technique, two person physical assist to rise from EOB and with pivotal movement to chair  Ambulation/Gait                 Stairs             Wheelchair Mobility    Modified Rankin (Stroke Patients Only) Modified Rankin (Stroke Patients Only) Pre-Morbid Rankin Score: Moderate disability Modified Rankin: Moderately severe disability     Balance Overall balance assessment: Needs assistance;History of Falls Sitting-balance support: Feet supported;Bilateral upper extremity supported Sitting balance-Leahy Scale: Poor Sitting balance - Comments: posterior lean, fluctuating between mod A and close min guard Postural control: Posterior lean Standing balance support: During functional activity Standing balance-Leahy Scale: Poor Standing balance comment: mod A x2                            Cognition Arousal/Alertness: Lethargic Behavior During Therapy: Flat affect Overall Cognitive Status: Impaired/Different from baseline Area of Impairment: Orientation;Attention;Memory;Following commands;Safety/judgement;Awareness;Problem solving                 Orientation Level: Disoriented to;Place;Time Current Attention Level: Sustained Memory: Decreased short-term memory Following Commands: Follows one step commands inconsistently;Follows one step commands with increased time Safety/Judgement: Decreased awareness of safety;Decreased awareness of deficits Awareness: Intellectual Problem Solving: Decreased initiation;Requires verbal cues;Requires tactile cues        Exercises      General Comments        Pertinent Vitals/Pain Pain Assessment: Faces Faces Pain Scale: No hurt    Home Living                      Prior Function            PT Goals (  current goals can now be found in the care plan section) Acute Rehab PT Goals PT Goal Formulation: With patient Time For Goal Achievement: 11/25/16 Potential to Achieve Goals: Fair Progress towards PT goals: Progressing toward goals    Frequency    Min 2X/week      PT Plan Current plan remains appropriate    Co-evaluation               AM-PAC PT "6 Clicks" Daily Activity  Outcome Measure  Difficulty turning over in bed (including adjusting bedclothes, sheets and blankets)?: Unable Difficulty moving from lying on back to sitting on the side of the bed? : Unable Difficulty sitting down on and standing up from a chair with arms (e.g., wheelchair, bedside commode, etc,.)?: Unable Help needed moving to and from a bed to chair (including a wheelchair)?: A Lot Help needed walking in hospital room?: A Lot Help needed climbing 3-5 steps with a railing? : Total 6 Click Score: 8    End of Session Equipment Utilized During Treatment: Gait belt Activity Tolerance: Patient limited by lethargy;Patient limited by fatigue Patient left: in chair;with call bell/phone within reach;with chair alarm set Nurse Communication: Mobility status PT Visit Diagnosis: Unsteadiness on feet (R26.81);Muscle weakness (generalized) (M62.81);Other symptoms and signs involving the nervous system (R29.898)     Time: 6808-8110 PT Time Calculation (min) (ACUTE ONLY): 20 min  Charges:  $Therapeutic Activity: 8-22 mins                    G Codes:       [place], [place], [place] 11/13/2016, 3:30 PM

## 2016-11-13 NOTE — H&P (View-Only) (Signed)
STROKE TEAM PROGRESS NOTE   SUBJECTIVE (INTERVAL HISTORY) [name] is at the bedside. Pt Initially sleeping, however easily arousable, awake alert and more interactive than yesterday. Following simple commands, still disorientated. No focal neurological deficit seen. Not able to do MRI due to agitation, we will repeat CT head. TTE showed all sedating density in LA, recommend TEE.   OBJECTIVE Temp:  [98.1 F (36.7 C)-99 F (37.2 C)] 98.5 F (36.9 C) (09/26 1302) Pulse Rate:  [72-88] 72 (09/26 1302) Cardiac Rhythm: Normal sinus rhythm (09/26 0700) Resp:  [16-20] 16 (09/26 1302) BP: (143-178)/(83-92) 144/89 (09/26 1302) SpO2:  [96 %-100 %] 99 % (09/26 1302)  CBC:   Recent Labs Lab 11/10/16 1309 11/10/16 2236 11/11/16 0725  WBC 7.0 7.9 7.1  NEUTROABS 4.8  --   --   HGB 12.2 12.2 13.4  HCT 34.6* 36.0 39.5  MCV 102.1* 102.3* 102.1*  PLT 204 206 427    Basic Metabolic Panel:   Recent Labs Lab 11/10/16 1309  11/11/16 1846 11/12/16 1121  NA 138  < > 135 138  K 3.2*  < > 3.1* 3.3*  CL 105  < > 102 106  CO2 24  < > 26 24  GLUCOSE 96  < > 129* 91  BUN 19  < > 10 9  CREATININE 0.91  < > 1.00 0.98  CALCIUM 8.8*  < > 9.0 8.5*  MG 1.9  --  2.0  --   < > = values in this interval not displayed.  Lipid Panel:     Component Value Date/Time   CHOL 156 11/11/2016 1846   TRIG 42 11/11/2016 1846   HDL 61 11/11/2016 1846   CHOLHDL 2.6 11/11/2016 1846   VLDL 8 11/11/2016 1846   LDLCALC 87 11/11/2016 1846   HgbA1c:  Lab Results  Component Value Date   HGBA1C 5.2 11/11/2016   Urine Drug Screen: No results found for: LABOPIA, COCAINSCRNUR, LABBENZ, AMPHETMU, THCU, LABBARB  Alcohol Level No results found for: [place] I have personally reviewed the radiological images below and agree with the radiology interpretations.  Dg Chest 2 View 11/10/2016 IMPRESSION: Chronic bronchitic changes. Subsegmental atelectasis in the lingula. No alveolar pneumonia nor CHF. Thoracic aortic  atherosclerosis. High-grade L1 compression fracture which which is unchanged since an abdominal CT scan of July 20, 2013.   Ct Head Wo Contrast Ct Cervical Spine Wo Contrast 11/10/2016 IMPRESSION: Moderate chronic ischemic white matter disease. Minimal diffuse cortical atrophy. Stable calcified meningioma seen in left posterior fossa. No acute intracranial abnormality seen. Severe multilevel degenerative disc disease. No acute abnormality seen in the cervical spine.   Carotid US 11/11/2016 Bilateral: 1-39% ICA stenosis. Vertebral artery flow is antegrade.  TTE 11/11/2016 - Vigorous LV systolic function; mild diastolic dysfunction;   sclerotic aortic valve with mild AI; oscillating density at LV   apex of uncertain etiology; suggest TEE to further assess.  CT head repeat pending  TEE pending   PHYSICAL EXAM  Temp:  [98.1 F (36.7 C)-99 F (37.2 C)] 98.5 F (36.9 C) (09/26 1302) Pulse Rate:  [72-88] 72 (09/26 1302) Resp:  [16-20] 16 (09/26 1302) BP: (143-178)/(83-92) 144/89 (09/26 1302) SpO2:  [96 %-100 %] 99 % (09/26 1302)  General - Well nourished, well developed, not in acute distress.  Ophthalmologic - Fundi not visualized due to noncooperation.  Cardiovascular - Regular rate and rhythm.  Neuro - awake, alert, not orientated to place, time or people. Able to follow simple commands. Paucity of speech  and moderate dysarthria. Able to name and repeat. Blinking bilaterally to visual threat, PERRL, no gaze deviation, attending to both sides. Facial symmetric, tongue is midline. Moving all extremities symmetrically, no significant focal weakness. DTR 1+ and no Babinski. Sensation, coordination not cooperative, and gait not tested.   ASSESSMENT/PLAN Ms. ZIAN MOHAMED is a 83 y.o. female with history of hypertension, hypothyroidism, hypercholesterolemia, depression, peripheral vascular disease, and some dementia who presents with multiple falls, head injury, skin tear on the right  arm, diarrhea, confusion, and new slurred speech. She did not receive IV t-PA due to arriving outside of the tPA treatment window.   Encephalopathy and agitation in the setting of advanced dementia  Resultant  improved mental status and dysarthria  CT head: no acute stroke  CT head repeat pending  Carotid Doppler unremarkable  2D Echo EF 65-70%, oscillating density at LV apex of uncertain etiology  Recommend TEE to rule out cardiac abnormality  LDL 73  HgbA1c 5.2  SCDs for VTE prophylaxis Diet Heart Room service appropriate? Yes; Fluid consistency: Thin  No antithrombotic prior to admission, now on aspirin 81 mg daily.  Ongoing aggressive stroke risk factor management  Therapy recommendations: SNF  Disposition: pending  Dementia  On Aricept and Namenda  Resultant agitation and confusion  Likely at baseline  Hypertension  Stable Long-term BP goal normotensive  Hyperlipidemia  Home meds: none  LDL 73, goal < 100  No need to introduce statin at this time  Other Stroke Risk Factors  Advanced age  Family hx stroke (father)  Other Active Problems  Hypokalemia 3.2 -> 2.7 ->3.3  Hospital day # 2  Rosalin Hawking, MD PhD Stroke Neurology 11/12/2016 4:19 PM   To contact Stroke Continuity provider, please refer to [email or web address] After hours, contact General Neurology

## 2016-11-13 NOTE — Interval H&P Note (Signed)
History and Physical Interval Note:  11/13/2016 1:59 PM  [name]  has presented today for surgery, with the diagnosis of STROKE  The various methods of treatment have been discussed with the patient and family. After consideration of risks, benefits and other options for treatment, the patient has consented to  Procedure(s): TRANSESOPHAGEAL ECHOCARDIOGRAM (TEE) (N/A) as a surgical intervention .  The patient's history has been reviewed, patient examined, no change in status, stable for surgery.  I have reviewed the patient's chart and labs.  Questions were answered to the patient's satisfaction.     [employer]

## 2016-11-13 NOTE — Progress Notes (Signed)
This note also relates to the following rows which could not be included: Pulse Rate - Cannot attach notes to unvalidated device data ECG Heart Rate - Cannot attach notes to unvalidated device data Resp - Cannot attach notes to unvalidated device data SpO2 - Cannot attach notes to unvalidated device data  Patient given 5 hydralazine for sbp over 180.  Nurse on floor aware of treatment.  Pressure when patient taken to floor decreased to sbp or 170.  RN aware of diastolic and will treat as appropriate.

## 2016-11-13 NOTE — Progress Notes (Signed)
CSW spoke with patient's daughter, [name], over the phone to discuss facility preferences. Patient's daughter requested [facility], and facility will have a private room available for the patient to admit if medically ready tomorrow.  CSW to fax clinical information to [facility] to initiate insurance authorization. CSW will update when received.  Laveda Abbe, [place] Clinical Social Worker [phone]

## 2016-11-14 ENCOUNTER — Encounter (HOSPITAL_COMMUNITY): Payer: Self-pay | Admitting: Emergency Medicine

## 2016-11-14 ENCOUNTER — Emergency Department (HOSPITAL_COMMUNITY)
Admission: EM | Admit: 2016-11-14 | Discharge: 2016-11-15 | Disposition: A | Discharge status: Home / Self Care | Payer: Medicare HMO | Attending: Emergency Medicine | Admitting: Emergency Medicine

## 2016-11-14 ENCOUNTER — Emergency Department (HOSPITAL_COMMUNITY): Payer: Medicare HMO

## 2016-11-14 DIAGNOSIS — Y92129 Unspecified place in nursing home as the place of occurrence of the external cause: Secondary | ICD-10-CM | POA: Diagnosis not present

## 2016-11-14 DIAGNOSIS — R4182 Altered mental status, unspecified: Secondary | ICD-10-CM | POA: Diagnosis not present

## 2016-11-14 DIAGNOSIS — E43 Unspecified severe protein-calorie malnutrition: Secondary | ICD-10-CM

## 2016-11-14 DIAGNOSIS — E46 Unspecified protein-calorie malnutrition: Secondary | ICD-10-CM | POA: Diagnosis not present

## 2016-11-14 DIAGNOSIS — S0990XA Unspecified injury of head, initial encounter: Secondary | ICD-10-CM | POA: Diagnosis not present

## 2016-11-14 DIAGNOSIS — S0003XA Contusion of scalp, initial encounter: Secondary | ICD-10-CM | POA: Insufficient documentation

## 2016-11-14 DIAGNOSIS — F0390 Unspecified dementia without behavioral disturbance: Secondary | ICD-10-CM | POA: Diagnosis not present

## 2016-11-14 DIAGNOSIS — Y999 Unspecified external cause status: Secondary | ICD-10-CM | POA: Insufficient documentation

## 2016-11-14 DIAGNOSIS — R41841 Cognitive communication deficit: Secondary | ICD-10-CM | POA: Diagnosis not present

## 2016-11-14 DIAGNOSIS — W19XXXA Unspecified fall, initial encounter: Secondary | ICD-10-CM | POA: Diagnosis not present

## 2016-11-14 DIAGNOSIS — E039 Hypothyroidism, unspecified: Secondary | ICD-10-CM | POA: Diagnosis not present

## 2016-11-14 DIAGNOSIS — Z7982 Long term (current) use of aspirin: Secondary | ICD-10-CM | POA: Diagnosis not present

## 2016-11-14 DIAGNOSIS — S0181XA Laceration without foreign body of other part of head, initial encounter: Secondary | ICD-10-CM | POA: Diagnosis not present

## 2016-11-14 DIAGNOSIS — R2689 Other abnormalities of gait and mobility: Secondary | ICD-10-CM | POA: Diagnosis not present

## 2016-11-14 DIAGNOSIS — Y9389 Activity, other specified: Secondary | ICD-10-CM | POA: Diagnosis not present

## 2016-11-14 DIAGNOSIS — S199XXA Unspecified injury of neck, initial encounter: Secondary | ICD-10-CM | POA: Diagnosis not present

## 2016-11-14 DIAGNOSIS — G8911 Acute pain due to trauma: Secondary | ICD-10-CM | POA: Diagnosis not present

## 2016-11-14 DIAGNOSIS — M542 Cervicalgia: Secondary | ICD-10-CM | POA: Diagnosis not present

## 2016-11-14 DIAGNOSIS — S098XXA Other specified injuries of head, initial encounter: Secondary | ICD-10-CM | POA: Diagnosis not present

## 2016-11-14 DIAGNOSIS — S51811D Laceration without foreign body of right forearm, subsequent encounter: Secondary | ICD-10-CM | POA: Diagnosis not present

## 2016-11-14 DIAGNOSIS — G9341 Metabolic encephalopathy: Secondary | ICD-10-CM | POA: Diagnosis not present

## 2016-11-14 DIAGNOSIS — I739 Peripheral vascular disease, unspecified: Secondary | ICD-10-CM | POA: Diagnosis not present

## 2016-11-14 DIAGNOSIS — G9340 Encephalopathy, unspecified: Secondary | ICD-10-CM | POA: Diagnosis not present

## 2016-11-14 DIAGNOSIS — S064X1A Epidural hemorrhage with loss of consciousness of 30 minutes or less, initial encounter: Secondary | ICD-10-CM | POA: Diagnosis not present

## 2016-11-14 DIAGNOSIS — R51 Headache: Secondary | ICD-10-CM | POA: Diagnosis not present

## 2016-11-14 DIAGNOSIS — Z79899 Other long term (current) drug therapy: Secondary | ICD-10-CM | POA: Insufficient documentation

## 2016-11-14 DIAGNOSIS — E7800 Pure hypercholesterolemia, unspecified: Secondary | ICD-10-CM | POA: Diagnosis not present

## 2016-11-14 DIAGNOSIS — I10 Essential (primary) hypertension: Secondary | ICD-10-CM | POA: Insufficient documentation

## 2016-11-14 DIAGNOSIS — Y92128 Other place in nursing home as the place of occurrence of the external cause: Secondary | ICD-10-CM | POA: Diagnosis not present

## 2016-11-14 DIAGNOSIS — M6281 Muscle weakness (generalized): Secondary | ICD-10-CM | POA: Diagnosis not present

## 2016-11-14 DIAGNOSIS — F0391 Unspecified dementia with behavioral disturbance: Secondary | ICD-10-CM | POA: Diagnosis not present

## 2016-11-14 DIAGNOSIS — R278 Other lack of coordination: Secondary | ICD-10-CM | POA: Diagnosis not present

## 2016-11-14 DIAGNOSIS — W01198A Fall on same level from slipping, tripping and stumbling with subsequent striking against other object, initial encounter: Secondary | ICD-10-CM | POA: Diagnosis not present

## 2016-11-14 DIAGNOSIS — G309 Alzheimer's disease, unspecified: Secondary | ICD-10-CM | POA: Insufficient documentation

## 2016-11-14 DIAGNOSIS — F0281 Dementia in other diseases classified elsewhere with behavioral disturbance: Secondary | ICD-10-CM | POA: Diagnosis not present

## 2016-11-14 DIAGNOSIS — Y939 Activity, unspecified: Secondary | ICD-10-CM | POA: Diagnosis not present

## 2016-11-14 DIAGNOSIS — F329 Major depressive disorder, single episode, unspecified: Secondary | ICD-10-CM | POA: Diagnosis not present

## 2016-11-14 DIAGNOSIS — R4781 Slurred speech: Secondary | ICD-10-CM | POA: Diagnosis not present

## 2016-11-14 DIAGNOSIS — R296 Repeated falls: Secondary | ICD-10-CM | POA: Diagnosis not present

## 2016-11-14 LAB — BASIC METABOLIC PANEL
Anion gap: 8 (ref 5–15)
BUN: 19 mg/dL (ref 6–20)
CO2: 23 mmol/L (ref 22–32)
Calcium: 8.9 mg/dL (ref 8.9–10.3)
Chloride: 106 mmol/L (ref 101–111)
Creatinine, Ser: 1.47 mg/dL — ABNORMAL HIGH (ref 0.44–1.00)
GFR calc Af Amer: 37 mL/min — ABNORMAL LOW (ref >60)
GFR calc non Af Amer: 32 mL/min — ABNORMAL LOW (ref >60)
Glucose, Bld: 103 mg/dL — ABNORMAL HIGH (ref 65–99)
Potassium: 3.4 mmol/L — ABNORMAL LOW (ref 3.5–5.1)
Sodium: 137 mmol/L (ref 135–145)

## 2016-11-14 MED ORDER — ENOXAPARIN SODIUM 30 MG/0.3ML ~~LOC~~ SOLN: 30.0000 mg | SUBCUTANEOUS | Status: DC

## 2016-11-14 MED ORDER — POTASSIUM CHLORIDE CRYS ER 20 MEQ PO TBCR: 40.0000 meq | EXTENDED_RELEASE_TABLET | Freq: Every day | ORAL | Status: DC

## 2016-11-14 MED ORDER — ENSURE ENLIVE PO LIQD: 237.0000 mL | Freq: Two times a day (BID) | ORAL | 12 refills | Status: DC

## 2016-11-14 MED ORDER — SODIUM CHLORIDE 0.9 % IV SOLN: INTRAVENOUS | Status: DC

## 2016-11-14 MED ORDER — ASPIRIN 81 MG PO TBEC: 81.0000 mg | DELAYED_RELEASE_TABLET | Freq: Every day | ORAL | Status: DC

## 2016-11-14 MED ORDER — ACETAMINOPHEN 325 MG PO TABS
650.0000 mg | ORAL_TABLET | Freq: Once | ORAL | Status: AC
  Administered 2016-11-14: 650 mg via ORAL
  Filled 2016-11-14: qty 2

## 2016-11-14 MED ORDER — POTASSIUM CHLORIDE CRYS ER 20 MEQ PO TBCR: 20.0000 meq | EXTENDED_RELEASE_TABLET | Freq: Every day | ORAL | Status: DC

## 2016-11-14 NOTE — Clinical Social Work Note (Addendum)
CSW called to check status of authorization. It has not been started. CSW re-faxed clinicals.  Dayton Scrape, [place] [phone]  4:04 pm Authorization still not started. [facility] will take 5-day LOG. Approved by Surveyor, quantity of social work. Patient's daughter has not completed paperwork but will need to do so before patient can transfer. She is 40 minutes away from the facility but will call the admissions coordinator to work out how to get paperwork completed.  Dayton Scrape, [place]

## 2016-11-14 NOTE — Clinical Social Work Placement (Signed)
   CLINICAL SOCIAL WORK PLACEMENT  NOTE  Date:  11/14/2016  Patient Details  Name: [name] MRN: [id number] Date of Birth: [birth year]  Clinical Social Work is seeking post-discharge placement for this patient at the [place] level of care (*CSW will initial, date and re-position this form in  chart as items are completed):  Yes   Patient/family provided with [place] Work Department's list of facilities offering this level of care within the geographic area requested by the patient (or if unable, by the patient's family).  Yes   Patient/family informed of their freedom to choose among providers that offer the needed level of care, that participate in Medicare, Medicaid or managed care program needed by the patient, have an available bed and are willing to accept the patient.  Yes   Patient/family informed of [facility]'s ownership interest in [facility] and [facility], as well as of the fact that they are under no obligation to receive care at these facilities.  PASRR submitted to EDS on 11/12/16     PASRR number received on       Existing PASRR number confirmed on 11/12/16     FL2 transmitted to all facilities in geographic area requested by pt/family on 11/12/16     FL2 transmitted to all facilities within larger geographic area on       Patient informed that his/her managed care company has contracts with or will negotiate with certain facilities, including the following:        Yes   Patient/family informed of bed offers received.  Patient chooses bed at [facility]     Physician recommends and patient chooses bed at      Patient to be transferred to [facility] on 11/14/16.  Patient to be transferred to facility by [facility]     Patient family notified on 11/14/16 of transfer.  Name of family member notified:  [name]     PHYSICIAN Please prepare prescriptions     Additional Comment:     _______________________________________________ Candie Chroman, LCSW 11/14/2016, 4:52 PM

## 2016-11-14 NOTE — Progress Notes (Signed)
Occupational Therapy Treatment Patient Details Name: [name] MRN: [id number] DOB: 06-10-1931 Today's Date: 11/14/2016    History of present illness Pt is a 83 y.o. female who presented to [facility] with a head injury from a fall, skin tear to right arm, diarrhea, confusion, and new slurred speech. Pt with 10 fall since Friday. PMH significant for hypertension, hypothyroidism, hypercholesterolemia, depression, peripheral vascular disease, and dementia.    OT comments  Pt able to tolerate sitting EOB x10 minutes with min guard assist to perform grooming tasks. Pt able to side step toward [facility] for repositioning with mod assist today. Pt following commands consistently and appropriately responding to questions but perseverating on wanting to go home and "living near here". D/c plan remains appropriate. Will continue to follow acutely.   Follow Up Recommendations  SNF;Supervision/Assistance - 24 hour    Equipment Recommendations  None recommended by OT    Recommendations for Other Services      Precautions / Restrictions Precautions Precautions: Fall Restrictions Weight Bearing Restrictions: No       Mobility Bed Mobility Overal bed mobility: Needs Assistance Bed Mobility: Supine to Sit;Sit to Supine     Supine to sit: Min guard Sit to supine: Min guard   General bed mobility comments: Min guard for safety and cues for technqiue. Increased time and effort required but no physical assist.  Transfers Overall transfer level: Needs assistance Equipment used: 1 person hand held assist Transfers: Sit to/from Stand Sit to Stand: Min assist         General transfer comment: Min assist to steady with boosting up from EOB. Mod assist for few side steps toward [facility] for repositioning. Cues for hand placement and technique.    Balance Overall balance assessment: Needs assistance;History of Falls Sitting-balance support: Feet supported;No upper extremity supported Sitting  balance-Leahy Scale: Good Sitting balance - Comments: supervision sitting EOB x10 minutes   Standing balance support: Bilateral upper extremity supported Standing balance-Leahy Scale: Poor Standing balance comment: HHA for support/balance                           ADL either performed or assessed with clinical judgement   ADL Overall ADL's : Needs assistance/impaired     Grooming: Set up;Min guard;Sitting;Wash/dry face;Oral care                   Toilet Transfer: Moderate assistance (HHA) Toilet Transfer Details (indicate cue type and reason): Simulated by sit to stand from EOB with steps toward [facility] for repositioning         Functional mobility during ADLs: Moderate assistance       Vision       Perception     Praxis      Cognition Arousal/Alertness: Awake/alert Behavior During Therapy: WFL for tasks assessed/performed Overall Cognitive Status: No family/caregiver present to determine baseline cognitive functioning                                 General Comments: Pt perseverating on "living close to here" and wanting to go home. Pt does appropriately respond to questions and commands. A&Ox1         Exercises     Shoulder Instructions       General Comments      Pertinent Vitals/ Pain       Pain Assessment: Faces Faces Pain Scale: Hurts a little  bit Pain Location: back Pain Descriptors / Indicators: Grimacing;Discomfort Pain Intervention(s): Monitored during session  Home Living                                          Prior Functioning/Environment              Frequency  Min 2X/week        Progress Toward Goals  OT Goals(current goals can now be found in the care plan section)  Progress towards OT goals: Progressing toward goals  Acute Rehab OT Goals Patient Stated Goal: go home  Plan Discharge plan remains appropriate    Co-evaluation                 AM-PAC PT "6 Clicks"  Daily Activity     Outcome Measure   Help from another person eating meals?: A Little Help from another person taking care of personal grooming?: A Little Help from another person toileting, which includes using toliet, bedpan, or urinal?: A Lot Help from another person bathing (including washing, rinsing, drying)?: A Lot Help from another person to put on and taking off regular upper body clothing?: A Little Help from another person to put on and taking off regular lower body clothing?: A Lot 6 Click Score: 15    End of Session Equipment Utilized During Treatment: Gait belt  OT Visit Diagnosis: Unsteadiness on feet (R26.81);Other abnormalities of gait and mobility (R26.89)   Activity Tolerance Patient tolerated treatment well   Patient Left in bed;with call bell/phone within reach;with bed alarm set   Nurse Communication          Time: 6283-6629 OT Time Calculation (min): 17 min  Charges: OT General Charges $OT Visit: 1 Visit OT Treatments $Self Care/Home Management : 8-22 mins  Korrin Waterfield A. Ulice Brilliant, M.S., OTR/L Pager: [place] 11/14/2016, 4:17 PM

## 2016-11-14 NOTE — Care Management Important Message (Signed)
Important Message  Patient Details  Name: [name] MRN: [id number] Date of Birth: [birth year]   Medicare Important Message Given:  Yes    Orbie Pyo 11/14/2016, 1:23 PM

## 2016-11-14 NOTE — Care Management Note (Signed)
Case Management Note  Patient Details  Name: RICKELL WIEHE MRN: [id number] Date of Birth: 25-Sep-1931  Subjective/Objective:                    Action/Plan: Pt discharging to [facility] today. No further needs per CM.   Expected Discharge Date:  11/14/16               Expected Discharge Plan:  Skilled Nursing Facility  In-House Referral:  Clinical Social Work  Discharge planning Services     Post Acute Care Choice:    Choice offered to:     DME Arranged:    DME Agency:     HH Arranged:    [place] Agency:     Status of Service:  Completed, signed off  If discussed at [employer], dates discussed:    Additional Comments:  Pollie Friar, RN 11/14/2016, 10:52 AM

## 2016-11-14 NOTE — Clinical Social Work Note (Signed)
CSW facilitated patient discharge including contacting patient family and facility to confirm patient discharge plans. Clinical information faxed to facility and family agreeable with plan. CSW arranged ambulance transport via [facility] to [facility]. RN to call report prior to discharge ([phone]).  CSW will sign off for now as social work intervention is no longer needed. Please consult us again if new needs arise.  Mattelyn Imhoff, CSW [phone]   

## 2016-11-14 NOTE — ED Provider Notes (Signed)
[facility] DEPT Provider Note   CSN: [id number] Arrival date & time: 11/14/16  2300     History   Chief Complaint Chief Complaint  Patient presents with  . Fall    HPI [name] is a 83 y.o. female.  Patient presents to the emergency department for evaluation of headache after a fall. Patient had a witnessed fall where she fell backwards and hit the back of her head on the ground. No loss of consciousness. Patient complains of moderate headache. She denies extremity injury, back pain.      Past Medical History:  Diagnosis Date  . Abnormality of gait   . Alzheimer's disease   . Calculus of kidney   . Closed fracture of seventh cervical vertebra without mention of spinal cord injury   . Depressive disorder, not elsewhere classified   . Edema   . Edema   . External hemorrhoids without mention of complication   . Gastric ulcer, unspecified as acute or chronic, without mention of hemorrhage, perforation, or obstruction   . GERD (gastroesophageal reflux disease)   . Hypertension   . Insomnia, unspecified   . Loss of weight   . Osteoarthrosis, unspecified whether generalized or localized, unspecified site   . Other protein-calorie malnutrition   . Pain in thoracic spine   . Personal history of fall   . Unspecified constipation   . Unspecified hereditary and idiopathic peripheral neuropathy   . Unspecified hypothyroidism   . Unspecified vitamin D deficiency   . Urinary frequency     Patient Active Problem List   Diagnosis Date Noted  . Protein-calorie malnutrition, severe 11/13/2016  . Multiple falls   . Encephalopathy   . Agitation   . Slurred speech 11/10/2016  . Vitamin D deficiency 03/07/2015  . Hx of fracture of vertebral column 12/28/2014  . Constipation 07/27/2013  . Fracture of vertebra 07/27/2013  . Loss of weight 07/13/2013  . Peripheral vascular disease, unspecified ([place]) 07/13/2013  . Epigastric mass 07/13/2013  . Dementia with behavioral  disturbance   . Major depression ([place])   . Insomnia, unspecified   . Fall 09/07/2012  . Fracture of right distal radius 09/07/2012  . Hypothyroidism 08/18/2006  . HYPERCHOLESTEROLEMIA 08/18/2006  . Essential hypertension 08/18/2006  . Restless legs 08/18/2006    Past Surgical History:  Procedure Laterality Date  . TEE WITHOUT CARDIOVERSION N/A 11/13/2016   Procedure: TRANSESOPHAGEAL ECHOCARDIOGRAM (TEE);  Surgeon: Jerline Pain, MD;  Location: [facility] ENDOSCOPY;  Service: Cardiovascular;  Laterality: N/A;  . WRIST SURGERY Right 2013    OB History    No data available       Home Medications    Prior to Admission medications   Medication Sig Start Date End Date Taking? Authorizing Provider  amitriptyline (ELAVIL) 50 MG tablet Take 50 mg by mouth at bedtime.    [provider]  amLODipine (NORVASC) 5 MG tablet TAKE 1 TABLET(5 MG) BY MOUTH DAILY 06/10/16   Estill Dooms, MD  aspirin EC 81 MG EC tablet Take 1 tablet (81 mg total) by mouth daily. 11/15/16   Rai, Ripudeep Raliegh Ip, MD  feeding supplement, ENSURE ENLIVE, (ENSURE ENLIVE) LIQD Take 237 mLs by mouth 2 (two) times daily between meals. 11/14/16   Rai, Vernelle Emerald, MD  levothyroxine (SYNTHROID, LEVOTHROID) 88 MCG tablet Take one tablet by mouth once daily 30 minutes before breakfast for thyroid 05/05/16   Estill Dooms, MD  Memantine HCl-Donepezil HCl [facility]) 28-10 MG CP24 Take 1 tablet by  mouth at bedtime. to stabilize memory 05/05/16   Estill Dooms, MD  potassium chloride SA (K-DUR,KLOR-CON) 20 MEQ tablet Take 1 tablet (20 mEq total) by mouth daily. 11/15/16   Rai, Ripudeep K, MD  senna-docusate (SENNA S) 8.6-50 MG tablet Take 1 tablet by mouth at bedtime.    [provider]  venlafaxine XR (EFFEXOR-XR) 75 MG 24 hr capsule TAKE 1 CAPSULE BY MOUTH EVERY DAY WITH BREAKFAST 10/13/16   [name], NP  Vitamin D, Ergocalciferol, (DRISDOL) 50000 units CAPS capsule Take one capsule by mouth every 14 days 05/13/16    [name], NP    Family History Family History  Problem Relation Age of Onset  . Cancer Mother   . CVA Father   . Cancer Brother   . Alcohol abuse Sister     Social History Social History  Substance Use Topics  . Smoking status: Never Smoker  . Smokeless tobacco: Never Used  . Alcohol use No     Allergies   Patient has no known allergies.   Review of Systems Review of Systems  Neurological: Positive for headaches.  All other systems reviewed and are negative.    Physical Exam Updated Vital Signs BP (!) 165/92 (BP Location: Right Arm)   Pulse 86   Temp 97.8 F (36.6 C) (Oral)   Resp 17   SpO2 97%   Physical Exam  Constitutional: She is oriented to person, place, and time. She appears well-developed and well-nourished. No distress.  HENT:  Head: Normocephalic. Head is with contusion.    Right Ear: Hearing normal.  Left Ear: Hearing normal.  Nose: Nose normal.  Mouth/Throat: Oropharynx is clear and moist and mucous membranes are normal.  Eyes: Pupils are equal, round, and reactive to light. Conjunctivae and EOM are normal.  Neck: Normal range of motion. Neck supple.  Cardiovascular: Regular rhythm, S1 normal and S2 normal.  Exam reveals no gallop and no friction rub.   No murmur heard. Pulmonary/Chest: Effort normal and breath sounds normal. No respiratory distress. She exhibits no tenderness.  Abdominal: Soft. Normal appearance and bowel sounds are normal. There is no hepatosplenomegaly. There is no tenderness. There is no rebound, no guarding, no tenderness at McBurney's point and negative Murphy's sign. No hernia.  Musculoskeletal: Normal range of motion.  Neurological: She is alert and oriented to person, place, and time. She has normal strength. No cranial nerve deficit or sensory deficit. Coordination normal. GCS eye subscore is 4. GCS verbal subscore is 5. GCS motor subscore is 6.  Skin: Skin is warm, dry and intact. No rash noted. No cyanosis.    Psychiatric: She has a normal mood and affect. Her speech is normal and behavior is normal. Thought content normal.  Nursing note and vitals reviewed.    ED Treatments / Results  Labs (all labs ordered are listed, but only abnormal results are displayed) Labs Reviewed - No data to display  EKG  EKG Interpretation None       Radiology Ct Head Wo Contrast  Result Date: 11/14/2016 CLINICAL DATA:  Dementia patient post fall from standing striking back of head. EXAM: CT HEAD WITHOUT CONTRAST CT CERVICAL SPINE WITHOUT CONTRAST TECHNIQUE: Multidetector CT imaging of the head and cervical spine was performed following the standard protocol without intravenous contrast. Multiplanar CT image reconstructions of the cervical spine were also generated. COMPARISON:  Head CT 2 days prior 11/12/2016, head and cervical spine CT 4 days prior 11/10/2016 FINDINGS: CT HEAD FINDINGS Brain:  No intracranial hemorrhage or evidence of acute ischemia. Generalized atrophy and chronic small vessel ischemia, stable from prior exam. Left cerebellar calcified meningioma without mass effect, unchanged from prior exam. Vascular: Atherosclerosis of skullbase vasculature without hyperdense vessel or abnormal calcification. Skull: No fracture or focal lesion. Sinuses/Orbits: No acute finding. Other: Right parietal scalp hematoma. CT CERVICAL SPINE FINDINGS Alignment: Unchanged broad-based leftward scoliotic curvature. No traumatic subluxation. Skull base and vertebrae: No acute fracture. The dens and skull base are intact. Minimal superior endplate irregularity about the T1 vertebral body is unchanged from prior exam. Bone island within C4 vertebral body is unchanged. Soft tissues and spinal canal: No prevertebral fluid or swelling. No visible canal hematoma. Disc levels: Diffuse disc space narrowing and endplate spurring, unchanged from recent prior. Multilevel facet arthropathy. Upper chest: No acute abnormality. Other: Carotid  vascular calcifications. IMPRESSION: 1. Right parietal scalp hematoma. No acute intracranial abnormality. No skull fracture. 2. Stable atrophy and chronic small vessel ischemia. 3. Degenerative change in the cervical spine without acute fracture. Electronically Signed   By: Jeb Levering M.D.   On: 11/14/2016 23:58   Ct Cervical Spine Wo Contrast  Result Date: 11/14/2016 CLINICAL DATA:  Dementia patient post fall from standing striking back of head. EXAM: CT HEAD WITHOUT CONTRAST CT CERVICAL SPINE WITHOUT CONTRAST TECHNIQUE: Multidetector CT imaging of the head and cervical spine was performed following the standard protocol without intravenous contrast. Multiplanar CT image reconstructions of the cervical spine were also generated. COMPARISON:  Head CT 2 days prior 11/12/2016, head and cervical spine CT 4 days prior 11/10/2016 FINDINGS: CT HEAD FINDINGS Brain: No intracranial hemorrhage or evidence of acute ischemia. Generalized atrophy and chronic small vessel ischemia, stable from prior exam. Left cerebellar calcified meningioma without mass effect, unchanged from prior exam. Vascular: Atherosclerosis of skullbase vasculature without hyperdense vessel or abnormal calcification. Skull: No fracture or focal lesion. Sinuses/Orbits: No acute finding. Other: Right parietal scalp hematoma. CT CERVICAL SPINE FINDINGS Alignment: Unchanged broad-based leftward scoliotic curvature. No traumatic subluxation. Skull base and vertebrae: No acute fracture. The dens and skull base are intact. Minimal superior endplate irregularity about the T1 vertebral body is unchanged from prior exam. Bone island within C4 vertebral body is unchanged. Soft tissues and spinal canal: No prevertebral fluid or swelling. No visible canal hematoma. Disc levels: Diffuse disc space narrowing and endplate spurring, unchanged from recent prior. Multilevel facet arthropathy. Upper chest: No acute abnormality. Other: Carotid vascular  calcifications. IMPRESSION: 1. Right parietal scalp hematoma. No acute intracranial abnormality. No skull fracture. 2. Stable atrophy and chronic small vessel ischemia. 3. Degenerative change in the cervical spine without acute fracture. Electronically Signed   By: Jeb Levering M.D.   On: 11/14/2016 23:58    Procedures Procedures (including critical care time)  Medications Ordered in ED Medications  acetaminophen (TYLENOL) tablet 650 mg (650 mg Oral Given 11/14/16 2357)     Initial Impression / Assessment and Plan / ED Course  I have reviewed the triage vital signs and the nursing notes.  Pertinent labs & imaging results that were available during my care of the patient were reviewed by me and considered in my medical decision making (see chart for details).     Patient presents to the emergency department for evaluation after a fall. Patient had a ground-level fall without loss of consciousness. Examination revealed small occipital hematoma, otherwise no evidence of trauma. She moves all extremities without difficulty, no concern for hip fracture or other extremity injury. She does, however,  have dementia, confused at baseline. She therefore underwent CT head and cervical spine to rule out occult injury. No injury was noted. Patient appropriate for return to skilled nursing facility.  Final Clinical Impressions(s) / ED Diagnoses   Final diagnoses:  Minor head injury, initial encounter    New Prescriptions New Prescriptions   No medications on file     Orpah Greek, MD 11/15/16 [phone]

## 2016-11-14 NOTE — Progress Notes (Signed)
  Speech Language Pathology Treatment: Dysphagia;Cognitive-Linquistic  Patient Details Name: [name] MRN: [id number] DOB: 09/10/1931 Today's Date: 11/14/2016 Time: 3151-7616 SLP Time Calculation (min) (ACUTE ONLY): 17 min  Assessment / Plan / Recommendation Clinical Impression  Pt was seen for skilled ST targeting goals for dysphagia and cognition.  Pt was asleep upon arrival but easily awakened to voice and light touch, asking what time it was.  Pt needed max assist to reorient to place and situation but once oriented seemed to have improved recall of information as she repeatedly stated "I can't believe I fell," and "I don't remember falling."  Pt's vocal intensity was markedly decreased today which impacted her speech intelligibility and resulted in pt needing min assist verbal cues to achieve intelligibility.  Suspect this to be related to fatigue as pt was sedated for TEE yesterday and is likely still experiencing some medication effects from that.  Pt consumed regular textures and thin liquids with no overt s/s of aspiration and minimal cues needed for use of universal swallowing precautions.  Suspect that pt's swallowing is returned to baseline and appears functional and safe overall as long as pt is awake enough for safe PO consumption.  As a result, no further ST needs are indicated for swallowing at this time.    HPI HPI: [name] a 83 y.o.femalewith a past medical history significant for hypertension, hypothyroidism, hypercholesterolemia, depression, peripheral vascular disease, and some dementia who presents with 10 falls since Friday, head injury, skin tear on the right arm, diarrhea, confusion, and new slurred speech.       SLP Plan  Goals updated       Recommendations                   Oral Care Recommendations: Oral care BID Follow up Recommendations: Skilled Nursing facility SLP Visit Diagnosis: Cognitive communication deficit (R41.841);Dysphagia,  unspecified (R13.10) Plan: Goals updated       GO                [name], [name] 11/14/2016, 10:43 AM

## 2016-11-14 NOTE — Discharge Summary (Addendum)
Physician Discharge Summary   Patient ID: [name] MRN: [id number] DOB/AGE: Jun 21, 1931 83 y.o.  Admit date: 11/10/2016 Discharge date: 11/14/2016  Primary Care Physician:  [name], NP  Discharge Diagnoses:    . Slurred speech . HYPERCHOLESTEROLEMIA . Essential hypertension   Acute metabolic encephalopathy in the setting of advanced dementia  hypokalemia   Multiple falls   Severe protein calorie malnutrition  Consults:   Neurology cardiology  Recommendations for Outpatient Follow-up:  1. PT evaluation recommended skilled nursing facility 2. Follow precautions 3. Please repeat CBC/BMET at next visit   DIET: heart healthy diet    Allergies:  No Known Allergies   DISCHARGE MEDICATIONS: Current Discharge Medication List    START taking these medications   Details  aspirin EC 81 MG EC tablet Take 1 tablet (81 mg total) by mouth daily.    feeding supplement, ENSURE ENLIVE, (ENSURE ENLIVE) LIQD Take 237 mLs by mouth 2 (two) times daily between meals. Qty: 237 mL, Refills: 12    potassium chloride SA (K-DUR,KLOR-CON) 20 MEQ tablet Take 1 tablet (20 mEq total) by mouth daily.      CONTINUE these medications which have NOT CHANGED   Details  amitriptyline (ELAVIL) 50 MG tablet Take 50 mg by mouth at bedtime.    amLODipine (NORVASC) 5 MG tablet TAKE 1 TABLET(5 MG) BY MOUTH DAILY Qty: 90 tablet, Refills: 5   Associated Diagnoses: Essential hypertension    levothyroxine (SYNTHROID, LEVOTHROID) 88 MCG tablet Take one tablet by mouth once daily 30 minutes before breakfast for thyroid Qty: 90 tablet, Refills: 3    Memantine HCl-Donepezil HCl (NAMZARIC) 28-10 MG CP24 Take 1 tablet by mouth at bedtime. to stabilize memory Qty: 90 capsule, Refills: 3   Associated Diagnoses: Late onset Alzheimer's disease without behavioral disturbance    senna-docusate (SENNA S) 8.6-50 MG tablet Take 1 tablet by mouth at bedtime.    venlafaxine XR (EFFEXOR-XR) 75 MG 24 hr  capsule TAKE 1 CAPSULE BY MOUTH EVERY DAY WITH BREAKFAST Qty: 90 capsule, Refills: 0   Associated Diagnoses: Severe episode of recurrent major depressive disorder, without psychotic features (HCC)    Vitamin D, Ergocalciferol, (DRISDOL) 50000 units CAPS capsule Take one capsule by mouth every 14 days Qty: 12 capsule, Refills: 3      STOP taking these medications     losartan (COZAAR) 100 MG tablet          Brief H and P: For complete details please refer to admission H and P, but in brief HPI on 11/10/2016 by Dr. Oletha Blend Ortiz a 83 y.o.femaleWith h/o dementia (per daughter diagnosed 4-62yrs ago), depression, HTN is brought to [facility] ED With fall and weakness. Patient is oriented to person only, daughter at bedside helped with the history. Per daughter patient recently went to [place] for two weeks, returned to [facility] Thursday. Patient has not been taking her home meds regularly while she is in [place] visiting. On Friday am , patient had some nausea, and one episode of diarrhea. patient has multiple falls since Friday and her speech is slurred which is not her baseline. No fever, no cough. No abdominal pain. Daughter is not sure if Patient has had bm since Friday. Per chart review, there is h/o constipation.   Hospital Course:  Slurred speech with acute metabolic encephalopathy, in the setting of advanced dementia - CT head showed moderate chronic ischemic white matter disease, minimal diffuse cortical atrophy, no acute intracranial abnormalities - CT C-spine showed severe  multilevel degenerative disc disease, no acute abnormality - hemoglobin A1c 5.2, PTOT recommended skilled nursing facility - carotid Dopplers 1-39% ICA stenosis - 2-D echo showed EF of 10-25%, grade 1 diastolic dysfunction, oscillating density at the apex of uncertain etiology, suggest TEE. Cardiology was consulted, TEE was negative, no evidence of LV thrombus or mass, normal EF, normal bubble study   -  continue aspirin 81 mg daily - repeat CT head on 9/26 showed no acute intracranial process, stable moderate to severe chronic small vessel ischemic disease, advanced mesial temporal lobe volume loss associated with neurodegenerative disease     HYPERCHOLESTEROLEMIA - LDL 87, per neuro, no need to introduce statin at this time     Essential hypertension - Currently stable  Hypokalemia, mild acute renal insufficiency - laced on potassium replacement - Hold losartan     Multiple falls - PT evaluation recommended skilled nursing facility  Advanced dementia with sundowning - avoid Ativan, patient has significant sedating effect - Continue Aricept - currently at baseline  Severe protein calorie malnutrition - continue nutritional supplements   Day of Discharge BP (!) 142/78 (BP Location: Right Arm)   Pulse (!) 103   Temp 97.9 F (36.6 C) (Axillary)   Resp 18   Ht 5\' 4"  (1.626 m)   Wt 56.7 kg (125 lb)   SpO2 97%   BMI 21.46 kg/m   Physical Exam: General: Alert and awake oriented x2 not in any acute distress. HEENT: anicteric sclera, pupils reactive to light and accommodation CVS: S1-S2 clear no murmur rubs or gallops Chest: clear to auscultation bilaterally, no wheezing rales or rhonchi Abdomen: soft nontender, nondistended, normal bowel sounds Extremities: no cyanosis, clubbing or edema noted bilaterally Neuro: Cranial nerves II-XII intact, no focal neurological deficits   The results of significant diagnostics from this hospitalization (including imaging, microbiology, ancillary and laboratory) are listed below for reference.    LAB RESULTS: Basic Metabolic Panel:  Recent Labs Lab 11/11/16 1846  11/13/16 0544 11/14/16 0806  NA 135  < > 138 137  K 3.1*  < > 3.5 3.4*  CL 102  < > 106 106  CO2 26  < > 24 23  GLUCOSE 129*  < > 84 103*  BUN 10  < > 13 19  CREATININE 1.00  < > 0.98 1.47*  CALCIUM 9.0  < > 8.9 8.9  MG 2.0  --   --   --   < > = values in  this interval not displayed. Liver Function Tests:  Recent Labs Lab 11/11/16 0725 11/11/16 1846  AST 26 33  ALT 14 16  ALKPHOS 62 56  BILITOT 1.2 1.1  PROT 6.4* 6.7  ALBUMIN 3.8 3.8    Recent Labs Lab 11/10/16 1309  LIPASE 28   No results for input(s): AMMONIA in the last 168 hours. CBC:  Recent Labs Lab 11/10/16 1309 11/10/16 2236 11/11/16 0725  WBC 7.0 7.9 7.1  NEUTROABS 4.8  --   --   HGB 12.2 12.2 13.4  HCT 34.6* 36.0 39.5  MCV 102.1* 102.3* 102.1*  PLT 204 206 227   Cardiac Enzymes: No results for input(s): CKTOTAL, CKMB, CKMBINDEX, TROPONINI in the last 168 hours. BNP: Invalid input(s): POCBNP CBG: No results for input(s): GLUCAP in the last 168 hours.  Significant Diagnostic Studies:  Dg Chest 2 View  Result Date: 11/10/2016 CLINICAL DATA:  Fatigue, falls, altered mental status. EXAM: CHEST  2 VIEW COMPARISON:  Chest x-ray dated February 16, 2013 FINDINGS: The lungs  are adequately inflated. The interstitial markings are coarse. There are increased lung markings at the left base likely in the lingula. The heart is normal in size. The pulmonary vascularity is not engorged. The lung markings are increased in the left upper lobe. There is calcification in the wall of the aortic arch. The bony thorax exhibits no acute abnormality. There is a high-grade compression fracture of L1. IMPRESSION: Chronic bronchitic changes. Subsegmental atelectasis in the lingula. No alveolar pneumonia nor CHF. Thoracic aortic atherosclerosis. High-grade L1 compression fracture which which is unchanged since an abdominal CT scan of July 20, 2013. Electronically Signed   By: David  Martinique M.D.   On: 11/10/2016 15:04   Ct Head Wo Contrast  Result Date: 11/10/2016 CLINICAL DATA:  Head injury after fall at home. EXAM: CT HEAD WITHOUT CONTRAST CT CERVICAL SPINE WITHOUT CONTRAST TECHNIQUE: Multidetector CT imaging of the head and cervical spine was performed following the standard protocol  without intravenous contrast. Multiplanar CT image reconstructions of the cervical spine were also generated. COMPARISON:  CT scan of December 19, 2014. FINDINGS: CT HEAD FINDINGS Brain: Moderate chronic ischemic white matter disease is noted. Minimal diffuse cortical atrophy is noted. Stable calcified meningioma is noted in left posterior fossa. There is no evidence of hemorrhage or acute infarction. No mass effect or midline shift is noted. Vascular: No hyperdense vessel or unexpected calcification. Skull: Normal. Negative for fracture or focal lesion. Sinuses/Orbits: No acute finding. Other: None. CT CERVICAL SPINE FINDINGS Alignment: Normal. Skull base and vertebrae: No acute fracture. No primary bone lesion or focal pathologic process. Soft tissues and spinal canal: No prevertebral fluid or swelling. No visible canal hematoma. Disc levels: Severe degenerative disc disease is noted at C3-4 and C5-6 with anterior osteophyte formation. Upper chest: Negative. Other: Levoscoliosis of cervical spine is noted. Degenerative changes seen involving the right-sided posterior facet joints. IMPRESSION: Moderate chronic ischemic white matter disease. Minimal diffuse cortical atrophy. Stable calcified meningioma seen in left posterior fossa. No acute intracranial abnormality seen. Severe multilevel degenerative disc disease. No acute abnormality seen in the cervical spine. Electronically Signed   By: Marijo Conception, M.D.   On: 11/10/2016 13:51   Ct Cervical Spine Wo Contrast  Result Date: 11/10/2016 CLINICAL DATA:  Head injury after fall at home. EXAM: CT HEAD WITHOUT CONTRAST CT CERVICAL SPINE WITHOUT CONTRAST TECHNIQUE: Multidetector CT imaging of the head and cervical spine was performed following the standard protocol without intravenous contrast. Multiplanar CT image reconstructions of the cervical spine were also generated. COMPARISON:  CT scan of December 19, 2014. FINDINGS: CT HEAD FINDINGS Brain: Moderate chronic  ischemic white matter disease is noted. Minimal diffuse cortical atrophy is noted. Stable calcified meningioma is noted in left posterior fossa. There is no evidence of hemorrhage or acute infarction. No mass effect or midline shift is noted. Vascular: No hyperdense vessel or unexpected calcification. Skull: Normal. Negative for fracture or focal lesion. Sinuses/Orbits: No acute finding. Other: None. CT CERVICAL SPINE FINDINGS Alignment: Normal. Skull base and vertebrae: No acute fracture. No primary bone lesion or focal pathologic process. Soft tissues and spinal canal: No prevertebral fluid or swelling. No visible canal hematoma. Disc levels: Severe degenerative disc disease is noted at C3-4 and C5-6 with anterior osteophyte formation. Upper chest: Negative. Other: Levoscoliosis of cervical spine is noted. Degenerative changes seen involving the right-sided posterior facet joints. IMPRESSION: Moderate chronic ischemic white matter disease. Minimal diffuse cortical atrophy. Stable calcified meningioma seen in left posterior fossa. No acute intracranial abnormality  seen. Severe multilevel degenerative disc disease. No acute abnormality seen in the cervical spine. Electronically Signed   By: Marijo Conception, M.D.   On: 11/10/2016 13:51    TEE Study Conclusions  - Left ventricle: The cavity size was normal. Wall thickness was   normal. Systolic function was normal. The estimated ejection   fraction was in the range of 55% to 60%. No evidence of thrombus   or apical LV abnormality. - Aortic valve: No evidence of vegetation. There was mild   regurgitation. - Mitral valve: No evidence of vegetation. There was mild   regurgitation. - Left atrium: No evidence of thrombus in the appendage. - Right atrium: No evidence of thrombus in the atrial cavity or   appendage. - Tricuspid valve: No evidence of vegetation. - Pulmonic valve: No evidence of vegetation.  Disposition and Follow-up: Discharge  Instructions    Diet - low sodium heart healthy    Complete by:  As directed    Increase activity slowly    Complete by:  As directed        DISPOSITION: SNF    DISCHARGE FOLLOW-UP  Contact information for follow-up providers    [name], NP. Schedule an appointment as soon as possible for a visit in 2 week(s).   Specialty:  Geriatric Medicine Contact information: [address] [place] [address] [phone]            Contact information for after-discharge care    Destination    [facility] Follow up.   Specialty:  [place] information: [address] [place] [phone]                   Time spent on Discharge: 108mins   Signed:   Estill Cotta M.D. [facility] 11/14/2016, 11:38 AM Pager: [phone]   Coding query addendum  Mild acute renal insufficiency- likely due to dehydration, losartan  - losartan was held, patient was placed on gentle hydration    Zylan Almquist M.D. [facility] 11/25/2016, 9:26 PM  Pager: [phone]

## 2016-11-14 NOTE — Clinical Social Work Note (Signed)
CSW faxed updated therapy notes to [facility] for review.  Dayton Scrape, [place]

## 2016-11-14 NOTE — ED Triage Notes (Signed)
Per EMS pt from [facility]. Recently discharged from our facility. Alzheimer's pt.  Pt had a fall from a standing position tonight striking the back of her head.  Pt has a contusion on the back of her head with mild swelling. No complaints at this time.  Pt takes aspirin daily.

## 2016-11-14 NOTE — Progress Notes (Signed)
Report called to [facility]. Awaiting [facility] for transport. Wendee Copp

## 2016-11-16 ENCOUNTER — Emergency Department (HOSPITAL_COMMUNITY)
Admission: EM | Admit: 2016-11-16 | Discharge: 2016-11-16 | Disposition: A | Discharge status: Home / Self Care | Payer: Medicare HMO | Attending: Emergency Medicine | Admitting: Emergency Medicine

## 2016-11-16 ENCOUNTER — Encounter (HOSPITAL_COMMUNITY): Payer: Self-pay | Admitting: *Deleted

## 2016-11-16 ENCOUNTER — Emergency Department (HOSPITAL_COMMUNITY): Payer: Medicare HMO

## 2016-11-16 DIAGNOSIS — S0003XA Contusion of scalp, initial encounter: Secondary | ICD-10-CM | POA: Diagnosis not present

## 2016-11-16 DIAGNOSIS — Y999 Unspecified external cause status: Secondary | ICD-10-CM | POA: Insufficient documentation

## 2016-11-16 DIAGNOSIS — Y92128 Other place in nursing home as the place of occurrence of the external cause: Secondary | ICD-10-CM | POA: Insufficient documentation

## 2016-11-16 DIAGNOSIS — Z79899 Other long term (current) drug therapy: Secondary | ICD-10-CM | POA: Insufficient documentation

## 2016-11-16 DIAGNOSIS — I10 Essential (primary) hypertension: Secondary | ICD-10-CM | POA: Insufficient documentation

## 2016-11-16 DIAGNOSIS — S199XXA Unspecified injury of neck, initial encounter: Secondary | ICD-10-CM | POA: Diagnosis not present

## 2016-11-16 DIAGNOSIS — G309 Alzheimer's disease, unspecified: Secondary | ICD-10-CM | POA: Insufficient documentation

## 2016-11-16 DIAGNOSIS — W19XXXA Unspecified fall, initial encounter: Secondary | ICD-10-CM | POA: Insufficient documentation

## 2016-11-16 DIAGNOSIS — S0990XA Unspecified injury of head, initial encounter: Secondary | ICD-10-CM | POA: Diagnosis not present

## 2016-11-16 DIAGNOSIS — E039 Hypothyroidism, unspecified: Secondary | ICD-10-CM | POA: Insufficient documentation

## 2016-11-16 DIAGNOSIS — M542 Cervicalgia: Secondary | ICD-10-CM | POA: Diagnosis not present

## 2016-11-16 DIAGNOSIS — Z7982 Long term (current) use of aspirin: Secondary | ICD-10-CM | POA: Insufficient documentation

## 2016-11-16 DIAGNOSIS — R51 Headache: Secondary | ICD-10-CM | POA: Diagnosis not present

## 2016-11-16 DIAGNOSIS — Y9389 Activity, other specified: Secondary | ICD-10-CM | POA: Insufficient documentation

## 2016-11-16 MED ORDER — ACETAMINOPHEN 500 MG PO TABS: 500.0000 mg | ORAL_TABLET | Freq: Once | ORAL | Status: DC

## 2016-11-16 NOTE — ED Provider Notes (Signed)
[facility] DEPT Provider Note   CSN: [id number] Arrival date & time: 11/16/16  0347     History   Chief Complaint Chief Complaint  Patient presents with  . Fall    HPI [name] is a 83 y.o. female.  HPI 32-year-old Caucasian female past medical history significant for Alzheimer's disease and frequent falls presents to the emergency department today for evaluation after an unwitnessed fall. Per the nursing facility staff the patient was in her bed at approximately 7:30 this morning. Staff will check on her 8:00 this morning and she was on the ground in front of the bathroom. They state that patient was going to use the restroom and likely fell. When they found her she was complaining of pain to the back of her head and she would close her eyes and wouldn't respond. They state that she is supposed to be using a wheelchair at baseline. History of falls. Seems to be at her baseline however more somnolent than usual. Patient stated the ED 2 days ago for a fall with hematoma to the back of the head. Patient responds to questions. She complains that her head hurts. Denies any other pain at this time. Nursing home did not think that patient lost consciousness. Patient is on aspirin but no other blood thinners.  Level V caveat due to dementia Past Medical History:  Diagnosis Date  . Abnormality of gait   . Alzheimer's disease   . Calculus of kidney   . Closed fracture of seventh cervical vertebra without mention of spinal cord injury   . Depressive disorder, not elsewhere classified   . Edema   . Edema   . External hemorrhoids without mention of complication   . Gastric ulcer, unspecified as acute or chronic, without mention of hemorrhage, perforation, or obstruction   . GERD (gastroesophageal reflux disease)   . Hypertension   . Insomnia, unspecified   . Loss of weight   . Osteoarthrosis, unspecified whether generalized or localized, unspecified site   . Other protein-calorie  malnutrition   . Pain in thoracic spine   . Personal history of fall   . Unspecified constipation   . Unspecified hereditary and idiopathic peripheral neuropathy   . Unspecified hypothyroidism   . Unspecified vitamin D deficiency   . Urinary frequency     Patient Active Problem List   Diagnosis Date Noted  . Protein-calorie malnutrition, severe 11/13/2016  . Multiple falls   . Encephalopathy   . Agitation   . Slurred speech 11/10/2016  . Vitamin D deficiency 03/07/2015  . Hx of fracture of vertebral column 12/28/2014  . Constipation 07/27/2013  . Fracture of vertebra 07/27/2013  . Loss of weight 07/13/2013  . Peripheral vascular disease, unspecified ([place]) 07/13/2013  . Epigastric mass 07/13/2013  . Dementia with behavioral disturbance   . Major depression ([place])   . Insomnia, unspecified   . Fall 09/07/2012  . Fracture of right distal radius 09/07/2012  . Hypothyroidism 08/18/2006  . HYPERCHOLESTEROLEMIA 08/18/2006  . Essential hypertension 08/18/2006  . Restless legs 08/18/2006    Past Surgical History:  Procedure Laterality Date  . TEE WITHOUT CARDIOVERSION N/A 11/13/2016   Procedure: TRANSESOPHAGEAL ECHOCARDIOGRAM (TEE);  Surgeon: Jerline Pain, MD;  Location: [facility] ENDOSCOPY;  Service: Cardiovascular;  Laterality: N/A;  . WRIST SURGERY Right 2013    OB History    No data available       Home Medications    Prior to Admission medications   Medication Sig Start  Date End Date Taking? Authorizing Provider  amitriptyline (ELAVIL) 50 MG tablet Take 50 mg by mouth at bedtime.    [provider]  amLODipine (NORVASC) 5 MG tablet TAKE 1 TABLET(5 MG) BY MOUTH DAILY 06/10/16   Estill Dooms, MD  aspirin EC 81 MG EC tablet Take 1 tablet (81 mg total) by mouth daily. 11/15/16   Rai, Ripudeep Raliegh Ip, MD  feeding supplement, ENSURE ENLIVE, (ENSURE ENLIVE) LIQD Take 237 mLs by mouth 2 (two) times daily between meals. 11/14/16   Rai, Vernelle Emerald, MD  levothyroxine (SYNTHROID,  LEVOTHROID) 88 MCG tablet Take one tablet by mouth once daily 30 minutes before breakfast for thyroid 05/05/16   Estill Dooms, MD  Memantine HCl-Donepezil HCl [facility]) 28-10 MG CP24 Take 1 tablet by mouth at bedtime. to stabilize memory 05/05/16   Estill Dooms, MD  potassium chloride SA (K-DUR,KLOR-CON) 20 MEQ tablet Take 1 tablet (20 mEq total) by mouth daily. 11/15/16   Rai, Ripudeep K, MD  senna-docusate (SENNA S) 8.6-50 MG tablet Take 1 tablet by mouth at bedtime.    [provider]  venlafaxine XR (EFFEXOR-XR) 75 MG 24 hr capsule TAKE 1 CAPSULE BY MOUTH EVERY DAY WITH BREAKFAST 10/13/16   [name], NP  Vitamin D, Ergocalciferol, (DRISDOL) 50000 units CAPS capsule Take one capsule by mouth every 14 days 05/13/16   [name], NP    Family History Family History  Problem Relation Age of Onset  . Cancer Mother   . CVA Father   . Cancer Brother   . Alcohol abuse Sister     Social History Social History  Substance Use Topics  . Smoking status: Never Smoker  . Smokeless tobacco: Never Used  . Alcohol use No     Allergies   Patient has no known allergies.   Review of Systems Review of Systems  Unable to perform ROS: Dementia     Physical Exam Updated Vital Signs BP (!) 148/88 (BP Location: Right Arm)   Pulse 73   Temp 98.6 F (37 C) (Oral)   Resp 20   Ht 5\' 4"  (1.626 m)   Wt 56.7 kg (125 lb)   SpO2 97%   BMI 21.46 kg/m   Physical Exam Physical Exam  Constitutional: Pt is oriented to person, place, and time. Appears well-developed and well-nourished. No distress.  HENT:  Head: Normocephalic. Hematoma to the posterior occiput with no laceration or bleeding. No skull depression. Tender to palpation. Ears: No bilateral hemotympanum. Nose: Nose normal. No septal hematoma. Mouth/Throat: Uvula is midline, oropharynx is clear and moist and mucous membranes are normal.  Eyes: Conjunctivae and EOM are normal. Pupils are equal, round, and  reactive to light.  Neck: No spinous process tenderness and no muscular tenderness present. No rigidity. Normal range of motion present.  Full ROM without pain No midline cervical tenderness No crepitus, deformity or step-offs  No paraspinal tenderness  Cardiovascular: Normal rate, regular rhythm and intact distal pulses.   Pulses:      Radial pulses are 2+ on the right side, and 2+ on the left side.       Dorsalis pedis pulses are 2+ on the right side, and 2+ on the left side.       Posterior tibial pulses are 2+ on the right side, and 2+ on the left side.  Pulmonary/Chest: Effort normal and breath sounds normal. No accessory muscle usage. No respiratory distress. No decreased breath sounds. No wheezes. No rhonchi. No  rales. Exhibits no tenderness and no bony tenderness.   No flail segment, crepitus or deformity Equal chest expansion  Abdominal: Soft. Normal appearance and bowel sounds are normal. There is no tenderness. There is no rigidity, no guarding and no CVA tenderness.   Abd soft and nontender  Musculoskeletal: Normal range of motion.       Thoracic back: Exhibits normal range of motion.       Lumbar back: Exhibits normal range of motion.  No tenderness to palpation of the spinous processes of the T-spine or L-spine No crepitus, deformity or step-offs No tenderness to palpation of the paraspinous muscles of the L-spine  Pelvis is stable. Moving all extremities  Lymphadenopathy:    Pt has no cervical adenopathy.  Neurological: Pt is alert and oriented to person, place, and time. Normal reflexes. No cranial nerve deficit. GCS eye subscore is 4. GCS verbal subscore is 5. GCS motor subscore is 6.  Reflex Scores:      Bicep reflexes are 2+ on the right side and 2+ on the left side.      Brachioradialis reflexes are 2+ on the right side and 2+ on the left side.      Patellar reflexes are 2+ on the right side and 2+ on the left side.      Achilles reflexes are 2+ on the right side  and 2+ on the left side. Speech is clear and goal oriented, follows commands  alert to person and place but not time which is baseline for patient per nursing facility staff. Grip strength normal. Lower extremity strength normal. EOMs intact. Responds appropriately to questions. Skin: Skin is warm and dry. No rash noted. Pt is not diaphoretic. No erythema.  Psychiatric: Normal mood and affect.  Nursing note and vitals reviewed.     ED Treatments / Results  Labs (all labs ordered are listed, but only abnormal results are displayed) Labs Reviewed - No data to display  EKG  EKG Interpretation None       Radiology Ct Head Wo Contrast  Result Date: 11/16/2016 CLINICAL DATA:  83 year old female with acute headache and neck pain following recent fall. EXAM: CT HEAD WITHOUT CONTRAST CT CERVICAL SPINE WITHOUT CONTRAST TECHNIQUE: Multidetector CT imaging of the head and cervical spine was performed following the standard protocol without intravenous contrast. Multiplanar CT image reconstructions of the cervical spine were also generated. COMPARISON:  11/14/2016 and prior CTs FINDINGS: CT HEAD FINDINGS Brain: No evidence of acute infarction, hemorrhage, hydrocephalus, extra-axial collection or midline shift. A 1.2 cm calcified left cerebellar meningioma is unchanged. Atrophy and severe chronic small-vessel white matter ischemic changes again noted. Vascular: Atherosclerotic calcifications noted Skull: Normal. Negative for fracture or focal lesion. Sinuses/Orbits: No acute finding. Other: None CT CERVICAL SPINE FINDINGS Alignment: Normal. Skull base and vertebrae: No acute fracture. No primary bone lesion or focal pathologic process. Soft tissues and spinal canal: No prevertebral fluid or swelling. No visible canal hematoma. Disc levels: Mild multilevel degenerative disc disease, spondylosis and facet arthropathy again noted. Upper chest: No acute abnormality Other: None IMPRESSION: 1. No evidence of  acute intracranial abnormality 2. No static evidence of acute injury to the cervical spine 3. Cerebral atrophy and chronic small-vessel white matter ischemic changes 4. Mild multilevel degenerative changes within the cervical spine. Electronically Signed   By: Margarette Canada M.D.   On: 11/16/2016 10:20   Ct Head Wo Contrast  Result Date: 11/14/2016 CLINICAL DATA:  Dementia patient post fall from standing striking back  of head. EXAM: CT HEAD WITHOUT CONTRAST CT CERVICAL SPINE WITHOUT CONTRAST TECHNIQUE: Multidetector CT imaging of the head and cervical spine was performed following the standard protocol without intravenous contrast. Multiplanar CT image reconstructions of the cervical spine were also generated. COMPARISON:  Head CT 2 days prior 11/12/2016, head and cervical spine CT 4 days prior 11/10/2016 FINDINGS: CT HEAD FINDINGS Brain: No intracranial hemorrhage or evidence of acute ischemia. Generalized atrophy and chronic small vessel ischemia, stable from prior exam. Left cerebellar calcified meningioma without mass effect, unchanged from prior exam. Vascular: Atherosclerosis of skullbase vasculature without hyperdense vessel or abnormal calcification. Skull: No fracture or focal lesion. Sinuses/Orbits: No acute finding. Other: Right parietal scalp hematoma. CT CERVICAL SPINE FINDINGS Alignment: Unchanged broad-based leftward scoliotic curvature. No traumatic subluxation. Skull base and vertebrae: No acute fracture. The dens and skull base are intact. Minimal superior endplate irregularity about the T1 vertebral body is unchanged from prior exam. Bone island within C4 vertebral body is unchanged. Soft tissues and spinal canal: No prevertebral fluid or swelling. No visible canal hematoma. Disc levels: Diffuse disc space narrowing and endplate spurring, unchanged from recent prior. Multilevel facet arthropathy. Upper chest: No acute abnormality. Other: Carotid vascular calcifications. IMPRESSION: 1. Right  parietal scalp hematoma. No acute intracranial abnormality. No skull fracture. 2. Stable atrophy and chronic small vessel ischemia. 3. Degenerative change in the cervical spine without acute fracture. Electronically Signed   By: Jeb Levering M.D.   On: 11/14/2016 23:58   Ct Cervical Spine Wo Contrast  Result Date: 11/16/2016 CLINICAL DATA:  83 year old female with acute headache and neck pain following recent fall. EXAM: CT HEAD WITHOUT CONTRAST CT CERVICAL SPINE WITHOUT CONTRAST TECHNIQUE: Multidetector CT imaging of the head and cervical spine was performed following the standard protocol without intravenous contrast. Multiplanar CT image reconstructions of the cervical spine were also generated. COMPARISON:  11/14/2016 and prior CTs FINDINGS: CT HEAD FINDINGS Brain: No evidence of acute infarction, hemorrhage, hydrocephalus, extra-axial collection or midline shift. A 1.2 cm calcified left cerebellar meningioma is unchanged. Atrophy and severe chronic small-vessel white matter ischemic changes again noted. Vascular: Atherosclerotic calcifications noted Skull: Normal. Negative for fracture or focal lesion. Sinuses/Orbits: No acute finding. Other: None CT CERVICAL SPINE FINDINGS Alignment: Normal. Skull base and vertebrae: No acute fracture. No primary bone lesion or focal pathologic process. Soft tissues and spinal canal: No prevertebral fluid or swelling. No visible canal hematoma. Disc levels: Mild multilevel degenerative disc disease, spondylosis and facet arthropathy again noted. Upper chest: No acute abnormality Other: None IMPRESSION: 1. No evidence of acute intracranial abnormality 2. No static evidence of acute injury to the cervical spine 3. Cerebral atrophy and chronic small-vessel white matter ischemic changes 4. Mild multilevel degenerative changes within the cervical spine. Electronically Signed   By: Margarette Canada M.D.   On: 11/16/2016 10:20   Ct Cervical Spine Wo Contrast  Result Date:  11/14/2016 CLINICAL DATA:  Dementia patient post fall from standing striking back of head. EXAM: CT HEAD WITHOUT CONTRAST CT CERVICAL SPINE WITHOUT CONTRAST TECHNIQUE: Multidetector CT imaging of the head and cervical spine was performed following the standard protocol without intravenous contrast. Multiplanar CT image reconstructions of the cervical spine were also generated. COMPARISON:  Head CT 2 days prior 11/12/2016, head and cervical spine CT 4 days prior 11/10/2016 FINDINGS: CT HEAD FINDINGS Brain: No intracranial hemorrhage or evidence of acute ischemia. Generalized atrophy and chronic small vessel ischemia, stable from prior exam. Left cerebellar calcified meningioma without mass effect,  unchanged from prior exam. Vascular: Atherosclerosis of skullbase vasculature without hyperdense vessel or abnormal calcification. Skull: No fracture or focal lesion. Sinuses/Orbits: No acute finding. Other: Right parietal scalp hematoma. CT CERVICAL SPINE FINDINGS Alignment: Unchanged broad-based leftward scoliotic curvature. No traumatic subluxation. Skull base and vertebrae: No acute fracture. The dens and skull base are intact. Minimal superior endplate irregularity about the T1 vertebral body is unchanged from prior exam. Bone island within C4 vertebral body is unchanged. Soft tissues and spinal canal: No prevertebral fluid or swelling. No visible canal hematoma. Disc levels: Diffuse disc space narrowing and endplate spurring, unchanged from recent prior. Multilevel facet arthropathy. Upper chest: No acute abnormality. Other: Carotid vascular calcifications. IMPRESSION: 1. Right parietal scalp hematoma. No acute intracranial abnormality. No skull fracture. 2. Stable atrophy and chronic small vessel ischemia. 3. Degenerative change in the cervical spine without acute fracture. Electronically Signed   By: Jeb Levering M.D.   On: 11/14/2016 23:58    Procedures Procedures (including critical care  time)  Medications Ordered in ED Medications - No data to display   Initial Impression / Assessment and Plan / ED Course  I have reviewed the triage vital signs and the nursing notes.  Pertinent labs & imaging results that were available during my care of the patient were reviewed by me and considered in my medical decision making (see chart for details).     Patient resents to the ED with unwitnessed fall. The patient is supposed to use a wheelchair at baseline however she was walking in her room today. Spoke with the nursing facility he states that she is at her baseline however a little more somnolent. Initially patient was somnolent in the room however she did respond to verbal stimuli and answer questions and follows commands appropriately. Exam revealed a small occipital hematoma likely from patient's fall 2 days ago that she was seen in the ED for. No other evidence of trauma noted. Pelvis is stable. No bruising to the chest or abdomen. Patient moving all extremities without any difficulties and have no concern for hip fracture or other extremity injury. CT scan was obtained due to patient's baseline dementia and confusion. CT scan was unremarkable for any acute findings.  Patient alert and oriented 4 at this time. She is answering questions appropriately. Eyes are open. Patient states that she feels much improved and ready to be discharged home. Patient was able to stand and weight-bear low suspicion for hip injury. Patient is appropriate to return to skilled nursing facility.  Discussed patient with Dr. Venora Maples who is agreeable to the above plan.   Final Clinical Impressions(s) / ED Diagnoses   Final diagnoses:  Fall, initial encounter  Contusion of scalp, initial encounter    New Prescriptions New Prescriptions   No medications on file     Aaron Edelman 11/16/16 1209    Doristine Devoid, PA-C 11/16/16 1210    Jola Schmidt, MD 11/16/16 [phone]

## 2016-11-16 NOTE — ED Notes (Signed)
PTAR at bedside.  Report and paperwork given.

## 2016-11-16 NOTE — ED Triage Notes (Signed)
PT was observed by staff to be sitting in chair at 0730 this AM. Staff returned at 0800 and found PT on the floor. Pt reports  Pain to back of head. Pt alert and Orented to place and name

## 2016-11-16 NOTE — Discharge Instructions (Signed)
Please make she follow up with her primary care doctor. Fall risk precautions at nursing facility.

## 2016-11-16 NOTE — ED Notes (Signed)
Stood pt up at bedside, no issues. Pt complains of tired knees, notified Carla(RN)

## 2016-11-17 DIAGNOSIS — I10 Essential (primary) hypertension: Secondary | ICD-10-CM | POA: Diagnosis not present

## 2016-11-17 DIAGNOSIS — E039 Hypothyroidism, unspecified: Secondary | ICD-10-CM | POA: Diagnosis not present

## 2016-11-17 DIAGNOSIS — F0391 Unspecified dementia with behavioral disturbance: Secondary | ICD-10-CM | POA: Diagnosis not present

## 2016-11-17 DIAGNOSIS — R296 Repeated falls: Secondary | ICD-10-CM | POA: Diagnosis not present

## 2016-11-18 ENCOUNTER — Ambulatory Visit: Payer: Medicare HMO | Admitting: Nurse Practitioner

## 2016-11-19 DIAGNOSIS — R296 Repeated falls: Secondary | ICD-10-CM | POA: Diagnosis not present

## 2016-11-19 DIAGNOSIS — F0390 Unspecified dementia without behavioral disturbance: Secondary | ICD-10-CM | POA: Diagnosis not present

## 2016-11-19 DIAGNOSIS — I10 Essential (primary) hypertension: Secondary | ICD-10-CM | POA: Diagnosis not present

## 2016-11-19 DIAGNOSIS — E46 Unspecified protein-calorie malnutrition: Secondary | ICD-10-CM | POA: Diagnosis not present

## 2016-11-24 ENCOUNTER — Other Ambulatory Visit: Payer: Self-pay | Admitting: *Deleted

## 2016-11-24 DIAGNOSIS — I10 Essential (primary) hypertension: Secondary | ICD-10-CM | POA: Diagnosis not present

## 2016-11-24 DIAGNOSIS — F0391 Unspecified dementia with behavioral disturbance: Secondary | ICD-10-CM | POA: Diagnosis not present

## 2016-11-24 DIAGNOSIS — R296 Repeated falls: Secondary | ICD-10-CM | POA: Diagnosis not present

## 2016-11-24 DIAGNOSIS — G9341 Metabolic encephalopathy: Secondary | ICD-10-CM | POA: Diagnosis not present

## 2016-11-24 NOTE — Patient Outreach (Signed)
[facility]) Care Management  11/24/2016  [name] 12-20-1931 [id number]   RNCM walked by patient room, patient sitting in floor, RNCM asked if she had fallen she said yes.  RNCM requested help for nurse in hall, who assisted patient.  Patient very confused, RNCM will attempt to visit her or speak with daughter at another visit.   Spoke with Vickii Chafe, SW at facility, she states that she hopes that patient will be able to go into placement somewhere, but  She is not having much luck speaking with patient daughter.   Plan to follow up closer to discharge.  Royetta Crochet. Laymond Purser, RN, BSN, [place] [phone]) Business Cell  [phone]) Toll Free Office

## 2016-12-01 DIAGNOSIS — G9341 Metabolic encephalopathy: Secondary | ICD-10-CM | POA: Diagnosis not present

## 2016-12-01 DIAGNOSIS — R296 Repeated falls: Secondary | ICD-10-CM | POA: Diagnosis not present

## 2016-12-01 DIAGNOSIS — I10 Essential (primary) hypertension: Secondary | ICD-10-CM | POA: Diagnosis not present

## 2016-12-01 DIAGNOSIS — F0391 Unspecified dementia with behavioral disturbance: Secondary | ICD-10-CM | POA: Diagnosis not present

## 2016-12-06 DIAGNOSIS — S51811D Laceration without foreign body of right forearm, subsequent encounter: Secondary | ICD-10-CM | POA: Diagnosis not present

## 2016-12-06 DIAGNOSIS — I10 Essential (primary) hypertension: Secondary | ICD-10-CM | POA: Diagnosis not present

## 2016-12-06 DIAGNOSIS — E039 Hypothyroidism, unspecified: Secondary | ICD-10-CM | POA: Diagnosis not present

## 2016-12-06 DIAGNOSIS — I739 Peripheral vascular disease, unspecified: Secondary | ICD-10-CM | POA: Diagnosis not present

## 2016-12-06 DIAGNOSIS — F0281 Dementia in other diseases classified elsewhere with behavioral disturbance: Secondary | ICD-10-CM | POA: Diagnosis not present

## 2016-12-06 DIAGNOSIS — F329 Major depressive disorder, single episode, unspecified: Secondary | ICD-10-CM | POA: Diagnosis not present

## 2016-12-07 DIAGNOSIS — F329 Major depressive disorder, single episode, unspecified: Secondary | ICD-10-CM | POA: Diagnosis not present

## 2016-12-07 DIAGNOSIS — E039 Hypothyroidism, unspecified: Secondary | ICD-10-CM | POA: Diagnosis not present

## 2016-12-07 DIAGNOSIS — S51811D Laceration without foreign body of right forearm, subsequent encounter: Secondary | ICD-10-CM | POA: Diagnosis not present

## 2016-12-07 DIAGNOSIS — F0281 Dementia in other diseases classified elsewhere with behavioral disturbance: Secondary | ICD-10-CM | POA: Diagnosis not present

## 2016-12-07 DIAGNOSIS — I10 Essential (primary) hypertension: Secondary | ICD-10-CM | POA: Diagnosis not present

## 2016-12-07 DIAGNOSIS — I739 Peripheral vascular disease, unspecified: Secondary | ICD-10-CM | POA: Diagnosis not present

## 2016-12-08 ENCOUNTER — Telehealth: Payer: Self-pay | Admitting: *Deleted

## 2016-12-08 DIAGNOSIS — F0281 Dementia in other diseases classified elsewhere with behavioral disturbance: Secondary | ICD-10-CM | POA: Diagnosis not present

## 2016-12-08 DIAGNOSIS — F329 Major depressive disorder, single episode, unspecified: Secondary | ICD-10-CM | POA: Diagnosis not present

## 2016-12-08 DIAGNOSIS — S51811D Laceration without foreign body of right forearm, subsequent encounter: Secondary | ICD-10-CM | POA: Diagnosis not present

## 2016-12-08 DIAGNOSIS — E039 Hypothyroidism, unspecified: Secondary | ICD-10-CM | POA: Diagnosis not present

## 2016-12-08 DIAGNOSIS — I10 Essential (primary) hypertension: Secondary | ICD-10-CM | POA: Diagnosis not present

## 2016-12-08 DIAGNOSIS — I739 Peripheral vascular disease, unspecified: Secondary | ICD-10-CM | POA: Diagnosis not present

## 2016-12-08 NOTE — Telephone Encounter (Signed)
Yamel with [facility] called requesting verbal orders for 2X3wks and 1x1wk for PT for leg weakness and balance. Patient was recently discharge from nursing facility and has a follow up appointment with you on Wednesday the 24th. Please Advise.

## 2016-12-08 NOTE — Telephone Encounter (Signed)
Nursing facility generally approves those orders until we see them

## 2016-12-09 ENCOUNTER — Other Ambulatory Visit: Payer: Self-pay

## 2016-12-09 NOTE — Patient Outreach (Signed)
[facility]) Care Management  12/09/2016  [name] 22-Jan-1932 [id number]   Referral Date: 12/09/16 Referral Source: [employer] Date of Admission: 11/14/2016 Diagnosis: Falls, weakness, and dementia Date of Discharge: 12/06/16 Facility: [facility]: [facility] 1 Spoke with daughter [name] who is Durable POA. She reports that her mother has memory issues and she and her sister [name] handle her affairs. Daughter [name] verifies HIPAA.  She states that patient was discharged from [facility] on Saturday and things have gone well.  She states that [facility] is providing home health and this time.   Social: Daughter [name] and [name] are primary caregivers.   Conditions: Patient with history of dementia, HTN, weakness, and falls.    Medications: Patient has all her medications and daughter has no question about medications  Appointments: Patient has an appointment with Dr. Nyoka Cowden on 12-10-16.  Daughter [name] to transport patient to appointment.   Consent: RN CM reviewed [facility] services with daughter [name].  She declines any needs presently.  Plan: RN CM will send letter and brochure for future reference. RN CM will close case at this time as no needs identified at this time.   RN CM will notify care management assistant of case status.     Jone Baseman, RN, MSN [facility] Care Management Care Management Coordinator Direct Line [phone] Toll Free: [phone]  Fax: [phone]

## 2016-12-10 ENCOUNTER — Encounter: Payer: Self-pay | Admitting: Nurse Practitioner

## 2016-12-10 ENCOUNTER — Ambulatory Visit (INDEPENDENT_AMBULATORY_CARE_PROVIDER_SITE_OTHER): Payer: Medicare HMO | Admitting: Nurse Practitioner

## 2016-12-10 ENCOUNTER — Telehealth: Payer: Self-pay

## 2016-12-10 VITALS — BP 160/100 | HR 76 | Temp 98.4°F | Resp 17 | Ht 64.0 in | Wt 129.2 lb

## 2016-12-10 DIAGNOSIS — Z23 Encounter for immunization: Secondary | ICD-10-CM

## 2016-12-10 DIAGNOSIS — F329 Major depressive disorder, single episode, unspecified: Secondary | ICD-10-CM | POA: Diagnosis not present

## 2016-12-10 DIAGNOSIS — K5900 Constipation, unspecified: Secondary | ICD-10-CM | POA: Diagnosis not present

## 2016-12-10 DIAGNOSIS — F0281 Dementia in other diseases classified elsewhere with behavioral disturbance: Secondary | ICD-10-CM | POA: Diagnosis not present

## 2016-12-10 DIAGNOSIS — N183 Chronic kidney disease, stage 3 unspecified: Secondary | ICD-10-CM

## 2016-12-10 DIAGNOSIS — G301 Alzheimer's disease with late onset: Secondary | ICD-10-CM

## 2016-12-10 DIAGNOSIS — I10 Essential (primary) hypertension: Secondary | ICD-10-CM

## 2016-12-10 DIAGNOSIS — E039 Hypothyroidism, unspecified: Secondary | ICD-10-CM | POA: Diagnosis not present

## 2016-12-10 DIAGNOSIS — M1991 Primary osteoarthritis, unspecified site: Secondary | ICD-10-CM

## 2016-12-10 DIAGNOSIS — F332 Major depressive disorder, recurrent severe without psychotic features: Secondary | ICD-10-CM | POA: Diagnosis not present

## 2016-12-10 DIAGNOSIS — S51811D Laceration without foreign body of right forearm, subsequent encounter: Secondary | ICD-10-CM | POA: Diagnosis not present

## 2016-12-10 DIAGNOSIS — E876 Hypokalemia: Secondary | ICD-10-CM | POA: Diagnosis not present

## 2016-12-10 DIAGNOSIS — I739 Peripheral vascular disease, unspecified: Secondary | ICD-10-CM | POA: Diagnosis not present

## 2016-12-10 LAB — COMPLETE METABOLIC PANEL WITH GFR
AG Ratio: 1.5 (calc) (ref 1.0–2.5)
ALT: 9 U/L (ref 6–29)
AST: 14 U/L (ref 10–35)
Albumin: 4.3 g/dL (ref 3.6–5.1)
Alkaline phosphatase (APISO): 95 U/L (ref 33–130)
BUN/Creatinine Ratio: 19 (calc) (ref 6–22)
BUN: 18 mg/dL (ref 7–25)
CO2: 28 mmol/L (ref 20–32)
Calcium: 9.2 mg/dL (ref 8.6–10.4)
Chloride: 102 mmol/L (ref 98–110)
Creat: 0.94 mg/dL — ABNORMAL HIGH (ref 0.60–0.88)
GFR, Est African American: 65 mL/min/{1.73_m2} (ref >=60)
GFR, Est Non African American: 56 mL/min/{1.73_m2} — ABNORMAL LOW (ref >=60)
Globulin: 2.9 g/dL (calc) (ref 1.9–3.7)
Glucose, Bld: 87 mg/dL (ref 65–99)
Potassium: 4.7 mmol/L (ref 3.5–5.3)
Sodium: 138 mmol/L (ref 135–146)
Total Bilirubin: 0.5 mg/dL (ref 0.2–1.2)
Total Protein: 7.2 g/dL (ref 6.1–8.1)

## 2016-12-10 MED ORDER — VENLAFAXINE HCL ER 75 MG PO CP24: ORAL_CAPSULE | ORAL | 1 refills | Status: DC

## 2016-12-10 MED ORDER — LEVOTHYROXINE SODIUM 88 MCG PO TABS: ORAL_TABLET | ORAL | 1 refills | Status: DC

## 2016-12-10 MED ORDER — AMLODIPINE BESYLATE 10 MG PO TABS: 10.0000 mg | ORAL_TABLET | Freq: Every day | ORAL | 1 refills | Status: DC

## 2016-12-10 MED ORDER — MEMANTINE HCL-DONEPEZIL HCL ER 28-10 MG PO CP24: 1.0000 | ORAL_CAPSULE | Freq: Every day | ORAL | 1 refills | Status: DC

## 2016-12-10 MED ORDER — AMITRIPTYLINE HCL 50 MG PO TABS: 50.0000 mg | ORAL_TABLET | Freq: Every day | ORAL | 1 refills | Status: DC

## 2016-12-10 MED ORDER — SENNOSIDES-DOCUSATE SODIUM 8.6-50 MG PO TABS: 1.0000 | ORAL_TABLET | Freq: Every day | ORAL | 1 refills | Status: DC

## 2016-12-10 MED ORDER — VITAMIN D (ERGOCALCIFEROL) 1.25 MG (50000 UNIT) PO CAPS: ORAL_CAPSULE | ORAL | 1 refills | Status: DC

## 2016-12-10 NOTE — Telephone Encounter (Signed)
Transition Care Management Follow-Up Telephone Call   Date discharged and where: [name] on 12/06/2016  How have you been since you were released from the hospital? Better than since she left to go there.  Any patient concerns? None  Items Reviewed:   Meds: Y, would like to review HTN and tylenol   Allergies: Y  Dietary Changes Reviewed: Y  Functional Questionnaire:  Independent-I Dependent-D  ADLs:   Dressing- I w/ assist    Eating- I   Maintaining continence- D w/ bladder   Transferring- I w/ walker   Transportation- D   Meal Prep- D   Managing Meds- D  Confirmed importance and Date/Time of follow-up visits scheduled: Yes Sherrie Mustache, NP 12/10/2016 @ 3:15pm   Confirmed with patient if condition worsens to call PCP or go to the Emergency Dept. Patient was given office number and encouraged to call back with questions or concerns: Yes

## 2016-12-10 NOTE — Patient Instructions (Addendum)
Increase norvasc to 10 mg by mouth daily for blood pressure Will have home health nursing to check blood pressure  Get generic Claritin- which is  loratadine 10 mg daily for nasal congestion.   Get acetaminophen (generic tylenol) 500 mg 1 in the morning and 1 in the evening to help with knee pain.   We will let you know about the potassium after her blood work.

## 2016-12-10 NOTE — Progress Notes (Signed)
Careteam: Patient Care Team: [name], NP as PCP - General (Geriatric Medicine)  Advanced Directive information Does Patient Have a Medical Advance Directive?: Yes, Type of Advance Directive: Healthcare Power of Attorney  No Known Allergies  Chief Complaint  Patient presents with  . Transitions Of Care    Pt is being seen after discharge from [facility]. Pt was there for PT due to weakness and falls.   . Other    Daughter in room     HPI: Patient is a 83 y.o. female seen in the office today to follow up nursing home visit.  Pt with hx of dementia, hyperlipidemia, multiple falls, weight loss, hypertension, depression. Went to the hospital in September due to slurred speech and multiple falls (5 times, would get up and just fall). CT head without acute abnormality at this time but with moderate chronic ischemic white matter disease, minimal diffuse cortical atrophy. She was discharged to SNF at that time. Since she was at SNF she had falls when she was first admitted but did not fall as often after that.   She came home on 12/06/16. Daughter feels like she is some better because she has not fallen as much.  She lives with her daughter but she is chair bound. She now has a caregivers to come in during the morning and working with family to come by in the evening to help with supper and getting ready for bed.  Been home for 4 days and this has been okay.  Has home health [facility] coming out. OT came out but said she did not need them. PT is coming twice weekly. Nursing aid came out today but she would not let her bath her.   Blood pressure (cozaar) medication was stopped during hospitalization.  She was started on potassium which daughter had but they are out of now- last potassium was this morning.   Complains of knee pains a lot.   Review of Systems:  Review of Systems  Constitutional: Positive for weight loss. Negative for chills and fever.  HENT: Negative for  tinnitus.   Respiratory: Negative for cough, sputum production and shortness of breath.   Cardiovascular: Negative for chest pain, palpitations and leg swelling.  Gastrointestinal: Negative for abdominal pain, constipation, diarrhea and heartburn.  Genitourinary: Negative for dysuria, frequency and urgency.  Musculoskeletal: Positive for falls and joint pain. Negative for back pain and myalgias.  Skin: Negative.   Neurological: Positive for weakness. Negative for dizziness and headaches.  Psychiatric/Behavioral: Positive for memory loss. Negative for depression. The patient does not have insomnia.     Past Medical History:  Diagnosis Date  . Abnormality of gait   . Alzheimer's disease   . Calculus of kidney   . Closed fracture of seventh cervical vertebra without mention of spinal cord injury   . Depressive disorder, not elsewhere classified   . Edema   . Edema   . External hemorrhoids without mention of complication   . Gastric ulcer, unspecified as acute or chronic, without mention of hemorrhage, perforation, or obstruction   . GERD (gastroesophageal reflux disease)   . Hypertension   . Insomnia, unspecified   . Loss of weight   . Osteoarthrosis, unspecified whether generalized or localized, unspecified site   . Other protein-calorie malnutrition   . Pain in thoracic spine   . Personal history of fall   . Unspecified constipation   . Unspecified hereditary and idiopathic peripheral neuropathy   . Unspecified hypothyroidism   .  Unspecified vitamin D deficiency   . Urinary frequency    Past Surgical History:  Procedure Laterality Date  . TEE WITHOUT CARDIOVERSION N/A 11/13/2016   Procedure: TRANSESOPHAGEAL ECHOCARDIOGRAM (TEE);  Surgeon: Jerline Pain, MD;  Location: [facility] ENDOSCOPY;  Service: Cardiovascular;  Laterality: N/A;  . WRIST SURGERY Right 2013   Social History:   reports that she has never smoked. She has never used smokeless tobacco. She reports that she does not  drink alcohol or use drugs.  Family History  Problem Relation Age of Onset  . Cancer Mother   . CVA Father   . Cancer Brother   . Alcohol abuse Sister     Medications: Patient's Medications  New Prescriptions   No medications on file  Previous Medications   AMITRIPTYLINE (ELAVIL) 50 MG TABLET    Take 50 mg by mouth at bedtime.   AMLODIPINE (NORVASC) 5 MG TABLET    TAKE 1 TABLET(5 MG) BY MOUTH DAILY   FEEDING SUPPLEMENT, ENSURE ENLIVE, (ENSURE ENLIVE) LIQD    Take 237 mLs by mouth 2 (two) times daily between meals.   LEVOTHYROXINE (SYNTHROID, LEVOTHROID) 88 MCG TABLET    Take one tablet by mouth once daily 30 minutes before breakfast for thyroid   MEMANTINE HCL-DONEPEZIL HCL (NAMZARIC) 28-10 MG CP24    Take 1 tablet by mouth at bedtime. to stabilize memory   SENNA-DOCUSATE (SENNA S) 8.6-50 MG TABLET    Take 1 tablet by mouth at bedtime.   VENLAFAXINE XR (EFFEXOR-XR) 75 MG 24 HR CAPSULE    TAKE 1 CAPSULE BY MOUTH EVERY DAY WITH BREAKFAST   VITAMIN D, ERGOCALCIFEROL, (DRISDOL) 50000 UNITS CAPS CAPSULE    Take one capsule by mouth every 14 days  Modified Medications   No medications on file  Discontinued Medications   ASPIRIN EC 81 MG EC TABLET    Take 1 tablet (81 mg total) by mouth daily.   POTASSIUM CHLORIDE SA (K-DUR,KLOR-CON) 20 MEQ TABLET    Take 1 tablet (20 mEq total) by mouth daily.     Physical Exam:  Vitals:   12/10/16 1540  BP: (!) 160/100  Pulse: 76  Resp: 17  Temp: 98.4 F (36.9 C)  TempSrc: Oral  SpO2: 95%  Weight: 129 lb 3.2 oz (58.6 kg)  Height: '5\' 4"'$  (1.626 m)   Body mass index is 22.18 kg/m.  Physical Exam  Constitutional: No distress.  Thin. Frail. female  HENT:  Head: Normocephalic and atraumatic.  Eyes: Pupils are equal, round, and reactive to light. Conjunctivae and EOM are normal.  Neck: Normal range of motion. Neck supple.  Cardiovascular: Normal rate, regular rhythm and normal heart sounds.   2/6 LSB murmur. Diminshed pedal pulses.    Pulmonary/Chest: Effort normal and breath sounds normal.  Abdominal: Soft. Bowel sounds are normal. She exhibits no distension and no mass. There is no tenderness. There is no rebound.  Rounded abdomen, soft, nontender  Musculoskeletal: Normal range of motion. She exhibits no edema.  Neurological: She is alert. No cranial nerve deficit. Coordination abnormal.  Severe memory loss.  09/12/09 was 25/30.  MMSE 13/30 -- 11/2015   Skin: She is not diaphoretic.  Psychiatric:  Poor judgment. Impulsive. depression   Labs reviewed: Basic Metabolic Panel:  Recent Labs  11/10/16 1309 11/10/16 1310  11/11/16 1846 11/12/16 1121 11/13/16 0544 11/14/16 0806  NA 138  --   < > 135 138 138 137  K 3.2*  --   < > 3.1* 3.3* 3.5 3.4*  CL 105  --   < > 102 106 106 106  CO2 24  --   < > '26 24 24 23  '$ GLUCOSE 96  --   < > 129* 91 84 103*  BUN 19  --   < > '10 9 13 19  '$ CREATININE 0.91  --   < > 1.00 0.98 0.98 1.47*  CALCIUM 8.8*  --   < > 9.0 8.5* 8.9 8.9  MG 1.9  --   --  2.0  --   --   --   TSH  --  5.808*  --   --   --   --   --   < > = values in this interval not displayed. Liver Function Tests:  Recent Labs  11/10/16 1309 11/11/16 0725 11/11/16 1846  AST 27 26 33  ALT '16 14 16  '$ ALKPHOS 64 62 56  BILITOT 1.3* 1.2 1.1  PROT 6.6 6.4* 6.7  ALBUMIN 3.9 3.8 3.8    Recent Labs  11/10/16 1309  LIPASE 28   No results for input(s): AMMONIA in the last 8760 hours. CBC:  Recent Labs  11/10/16 1309 11/10/16 2236 11/11/16 0725  WBC 7.0 7.9 7.1  NEUTROABS 4.8  --   --   HGB 12.2 12.2 13.4  HCT 34.6* 36.0 39.5  MCV 102.1* 102.3* 102.1*  PLT 204 206 227   Lipid Panel:  Recent Labs  11/11/16 1846  CHOL 156  HDL 61  LDLCALC 87  TRIG 42  CHOLHDL 2.6   TSH:  Recent Labs  11/10/16 1310  TSH 5.808*   A1C: Lab Results  Component Value Date   HGBA1C 5.2 11/11/2016     Assessment/Plan 1. Hypokalemia -has currently been on potassium but daughter questions if this needs  to continue. Finished last tablet today. Will follow up lab at this time - CMP with eGFR  2. Chronic kidney disease, stage 3 (HCC) -Encourage proper hydration and to avoid NSAIDS (Aleve, Advil, Motrin, Ibuprofen)  -pt does not drink much water despite being encouraged to drink. -ACE was stopped due to worsening renal function.  - CMP with eGFR  3. Constipation, unspecified constipation type Controlled on senna s daily  - senna-docusate (SENNA S) 8.6-50 MG tablet; Take 1 tablet by mouth at bedtime.  Dispense: 90 tablet; Refill: 1  4. Essential hypertension -not controlled after losartan being stopped. Will increase norvasc at this time and have home health nursing check blood pressures are report.  - CMP with eGFR - amLODipine (NORVASC) 10 MG tablet; Take 1 tablet (10 mg total) by mouth daily.  Dispense: 90 tablet; Refill: 1  5. Severe episode of recurrent major depressive disorder, without psychotic features ([place]) -ongoing due to advancing dementia. Will cont on effexor at this time.  - venlafaxine XR (EFFEXOR-XR) 75 MG 24 hr capsule; TAKE 1 CAPSULE BY MOUTH EVERY DAY WITH BREAKFAST  Dispense: 90 capsule; Refill: 1  6. Primary osteoarthritis, unspecified site -bilateral knee pain. Okay to use tylenol 500 mg by mouth BID routinely. Can use tylenol 1-2 mg by mouth every 8 hours as needed pain.   7. Late onset Alzheimer's disease without behavioral disturbance -ongoing, needing increase assistance in the home. Weight gain since she was in rehab. Encouraged to cont ensure and meals TID.  - Memantine HCl-Donepezil HCl (NAMZARIC) 28-10 MG CP24; Take 1 capsule by mouth at bedtime. to stabilize memory  Dispense: 90 capsule; Refill: 1  Next appt: 3 months, sooner if needed [name]  Beaulah Corin, [facility] [phone] 8 am - 5 pm) [phone] (after hours)

## 2016-12-11 DIAGNOSIS — S51811D Laceration without foreign body of right forearm, subsequent encounter: Secondary | ICD-10-CM | POA: Diagnosis not present

## 2016-12-11 DIAGNOSIS — I10 Essential (primary) hypertension: Secondary | ICD-10-CM | POA: Diagnosis not present

## 2016-12-11 DIAGNOSIS — F329 Major depressive disorder, single episode, unspecified: Secondary | ICD-10-CM | POA: Diagnosis not present

## 2016-12-11 DIAGNOSIS — I739 Peripheral vascular disease, unspecified: Secondary | ICD-10-CM | POA: Diagnosis not present

## 2016-12-11 DIAGNOSIS — E039 Hypothyroidism, unspecified: Secondary | ICD-10-CM | POA: Diagnosis not present

## 2016-12-11 DIAGNOSIS — F0281 Dementia in other diseases classified elsewhere with behavioral disturbance: Secondary | ICD-10-CM | POA: Diagnosis not present

## 2016-12-12 DIAGNOSIS — I10 Essential (primary) hypertension: Secondary | ICD-10-CM | POA: Diagnosis not present

## 2016-12-12 DIAGNOSIS — I739 Peripheral vascular disease, unspecified: Secondary | ICD-10-CM | POA: Diagnosis not present

## 2016-12-12 DIAGNOSIS — E039 Hypothyroidism, unspecified: Secondary | ICD-10-CM | POA: Diagnosis not present

## 2016-12-12 DIAGNOSIS — F0281 Dementia in other diseases classified elsewhere with behavioral disturbance: Secondary | ICD-10-CM | POA: Diagnosis not present

## 2016-12-12 DIAGNOSIS — F329 Major depressive disorder, single episode, unspecified: Secondary | ICD-10-CM | POA: Diagnosis not present

## 2016-12-12 DIAGNOSIS — S51811D Laceration without foreign body of right forearm, subsequent encounter: Secondary | ICD-10-CM | POA: Diagnosis not present

## 2016-12-15 DIAGNOSIS — E039 Hypothyroidism, unspecified: Secondary | ICD-10-CM | POA: Diagnosis not present

## 2016-12-15 DIAGNOSIS — I739 Peripheral vascular disease, unspecified: Secondary | ICD-10-CM | POA: Diagnosis not present

## 2016-12-15 DIAGNOSIS — F0281 Dementia in other diseases classified elsewhere with behavioral disturbance: Secondary | ICD-10-CM | POA: Diagnosis not present

## 2016-12-15 DIAGNOSIS — I10 Essential (primary) hypertension: Secondary | ICD-10-CM | POA: Diagnosis not present

## 2016-12-15 DIAGNOSIS — F329 Major depressive disorder, single episode, unspecified: Secondary | ICD-10-CM | POA: Diagnosis not present

## 2016-12-15 DIAGNOSIS — S51811D Laceration without foreign body of right forearm, subsequent encounter: Secondary | ICD-10-CM | POA: Diagnosis not present

## 2016-12-16 DIAGNOSIS — F0281 Dementia in other diseases classified elsewhere with behavioral disturbance: Secondary | ICD-10-CM | POA: Diagnosis not present

## 2016-12-16 DIAGNOSIS — I739 Peripheral vascular disease, unspecified: Secondary | ICD-10-CM | POA: Diagnosis not present

## 2016-12-16 DIAGNOSIS — E039 Hypothyroidism, unspecified: Secondary | ICD-10-CM | POA: Diagnosis not present

## 2016-12-16 DIAGNOSIS — I10 Essential (primary) hypertension: Secondary | ICD-10-CM | POA: Diagnosis not present

## 2016-12-16 DIAGNOSIS — F329 Major depressive disorder, single episode, unspecified: Secondary | ICD-10-CM | POA: Diagnosis not present

## 2016-12-16 DIAGNOSIS — S51811D Laceration without foreign body of right forearm, subsequent encounter: Secondary | ICD-10-CM | POA: Diagnosis not present

## 2016-12-17 DIAGNOSIS — I10 Essential (primary) hypertension: Secondary | ICD-10-CM | POA: Diagnosis not present

## 2016-12-17 DIAGNOSIS — F0281 Dementia in other diseases classified elsewhere with behavioral disturbance: Secondary | ICD-10-CM | POA: Diagnosis not present

## 2016-12-17 DIAGNOSIS — S51811D Laceration without foreign body of right forearm, subsequent encounter: Secondary | ICD-10-CM | POA: Diagnosis not present

## 2016-12-17 DIAGNOSIS — I739 Peripheral vascular disease, unspecified: Secondary | ICD-10-CM | POA: Diagnosis not present

## 2016-12-17 DIAGNOSIS — F329 Major depressive disorder, single episode, unspecified: Secondary | ICD-10-CM | POA: Diagnosis not present

## 2016-12-17 DIAGNOSIS — E039 Hypothyroidism, unspecified: Secondary | ICD-10-CM | POA: Diagnosis not present

## 2016-12-18 ENCOUNTER — Telehealth: Payer: Self-pay

## 2016-12-18 DIAGNOSIS — S51811D Laceration without foreign body of right forearm, subsequent encounter: Secondary | ICD-10-CM | POA: Diagnosis not present

## 2016-12-18 DIAGNOSIS — E039 Hypothyroidism, unspecified: Secondary | ICD-10-CM | POA: Diagnosis not present

## 2016-12-18 DIAGNOSIS — F0281 Dementia in other diseases classified elsewhere with behavioral disturbance: Secondary | ICD-10-CM | POA: Diagnosis not present

## 2016-12-18 DIAGNOSIS — F329 Major depressive disorder, single episode, unspecified: Secondary | ICD-10-CM | POA: Diagnosis not present

## 2016-12-18 DIAGNOSIS — I739 Peripheral vascular disease, unspecified: Secondary | ICD-10-CM | POA: Diagnosis not present

## 2016-12-18 DIAGNOSIS — I10 Essential (primary) hypertension: Secondary | ICD-10-CM | POA: Diagnosis not present

## 2016-12-18 NOTE — Telephone Encounter (Signed)
Incoming fax received to initiate Prior Auth for Amitriptyline via covermymeds   KEY: [id number]  Prior Auth completed, awaiting response from [facility].  Diagnosis Codes used (per [name], NP): G25.8( restless legs)  and F32.9 (major depressive disorder)

## 2016-12-19 DIAGNOSIS — S51811D Laceration without foreign body of right forearm, subsequent encounter: Secondary | ICD-10-CM | POA: Diagnosis not present

## 2016-12-19 DIAGNOSIS — I739 Peripheral vascular disease, unspecified: Secondary | ICD-10-CM | POA: Diagnosis not present

## 2016-12-19 DIAGNOSIS — F0281 Dementia in other diseases classified elsewhere with behavioral disturbance: Secondary | ICD-10-CM | POA: Diagnosis not present

## 2016-12-19 DIAGNOSIS — I10 Essential (primary) hypertension: Secondary | ICD-10-CM | POA: Diagnosis not present

## 2016-12-19 DIAGNOSIS — F329 Major depressive disorder, single episode, unspecified: Secondary | ICD-10-CM | POA: Diagnosis not present

## 2016-12-19 DIAGNOSIS — E039 Hypothyroidism, unspecified: Secondary | ICD-10-CM | POA: Diagnosis not present

## 2016-12-19 NOTE — Telephone Encounter (Signed)
PA request still in process

## 2016-12-22 NOTE — Telephone Encounter (Signed)
A fax was received from [facility] stating that the PA for amitriptyline 50 mg tablets was approved through 12/20/2018.

## 2016-12-24 DIAGNOSIS — F0281 Dementia in other diseases classified elsewhere with behavioral disturbance: Secondary | ICD-10-CM | POA: Diagnosis not present

## 2016-12-24 DIAGNOSIS — E039 Hypothyroidism, unspecified: Secondary | ICD-10-CM | POA: Diagnosis not present

## 2016-12-24 DIAGNOSIS — I10 Essential (primary) hypertension: Secondary | ICD-10-CM | POA: Diagnosis not present

## 2016-12-24 DIAGNOSIS — S51811D Laceration without foreign body of right forearm, subsequent encounter: Secondary | ICD-10-CM | POA: Diagnosis not present

## 2016-12-24 DIAGNOSIS — I739 Peripheral vascular disease, unspecified: Secondary | ICD-10-CM | POA: Diagnosis not present

## 2016-12-24 DIAGNOSIS — F329 Major depressive disorder, single episode, unspecified: Secondary | ICD-10-CM | POA: Diagnosis not present

## 2016-12-25 ENCOUNTER — Telehealth: Payer: Self-pay | Admitting: *Deleted

## 2016-12-25 DIAGNOSIS — I739 Peripheral vascular disease, unspecified: Secondary | ICD-10-CM | POA: Diagnosis not present

## 2016-12-25 DIAGNOSIS — S51811D Laceration without foreign body of right forearm, subsequent encounter: Secondary | ICD-10-CM | POA: Diagnosis not present

## 2016-12-25 DIAGNOSIS — F329 Major depressive disorder, single episode, unspecified: Secondary | ICD-10-CM | POA: Diagnosis not present

## 2016-12-25 DIAGNOSIS — I10 Essential (primary) hypertension: Secondary | ICD-10-CM | POA: Diagnosis not present

## 2016-12-25 DIAGNOSIS — F0281 Dementia in other diseases classified elsewhere with behavioral disturbance: Secondary | ICD-10-CM | POA: Diagnosis not present

## 2016-12-25 DIAGNOSIS — E039 Hypothyroidism, unspecified: Secondary | ICD-10-CM | POA: Diagnosis not present

## 2016-12-25 NOTE — Telephone Encounter (Signed)
Sree with [facility] called requesting verbal orders to continue PT 2X1week and 1X2weeks. Verbal order given.

## 2016-12-29 DIAGNOSIS — E039 Hypothyroidism, unspecified: Secondary | ICD-10-CM | POA: Diagnosis not present

## 2016-12-29 DIAGNOSIS — F0281 Dementia in other diseases classified elsewhere with behavioral disturbance: Secondary | ICD-10-CM | POA: Diagnosis not present

## 2016-12-29 DIAGNOSIS — F329 Major depressive disorder, single episode, unspecified: Secondary | ICD-10-CM | POA: Diagnosis not present

## 2016-12-29 DIAGNOSIS — S51811D Laceration without foreign body of right forearm, subsequent encounter: Secondary | ICD-10-CM | POA: Diagnosis not present

## 2016-12-29 DIAGNOSIS — I10 Essential (primary) hypertension: Secondary | ICD-10-CM | POA: Diagnosis not present

## 2016-12-29 DIAGNOSIS — I739 Peripheral vascular disease, unspecified: Secondary | ICD-10-CM | POA: Diagnosis not present

## 2016-12-29 NOTE — Telephone Encounter (Signed)
Nurse from [facility] call to state pt is refusing therapy and they will discharge her.

## 2016-12-31 DIAGNOSIS — F329 Major depressive disorder, single episode, unspecified: Secondary | ICD-10-CM | POA: Diagnosis not present

## 2016-12-31 DIAGNOSIS — E039 Hypothyroidism, unspecified: Secondary | ICD-10-CM | POA: Diagnosis not present

## 2016-12-31 DIAGNOSIS — F0281 Dementia in other diseases classified elsewhere with behavioral disturbance: Secondary | ICD-10-CM | POA: Diagnosis not present

## 2016-12-31 DIAGNOSIS — I10 Essential (primary) hypertension: Secondary | ICD-10-CM | POA: Diagnosis not present

## 2016-12-31 DIAGNOSIS — S51811D Laceration without foreign body of right forearm, subsequent encounter: Secondary | ICD-10-CM | POA: Diagnosis not present

## 2016-12-31 DIAGNOSIS — I739 Peripheral vascular disease, unspecified: Secondary | ICD-10-CM | POA: Diagnosis not present

## 2017-01-01 DIAGNOSIS — S51811D Laceration without foreign body of right forearm, subsequent encounter: Secondary | ICD-10-CM | POA: Diagnosis not present

## 2017-01-01 DIAGNOSIS — F329 Major depressive disorder, single episode, unspecified: Secondary | ICD-10-CM | POA: Diagnosis not present

## 2017-01-01 DIAGNOSIS — I10 Essential (primary) hypertension: Secondary | ICD-10-CM | POA: Diagnosis not present

## 2017-01-01 DIAGNOSIS — F0281 Dementia in other diseases classified elsewhere with behavioral disturbance: Secondary | ICD-10-CM | POA: Diagnosis not present

## 2017-01-01 DIAGNOSIS — E039 Hypothyroidism, unspecified: Secondary | ICD-10-CM | POA: Diagnosis not present

## 2017-01-01 DIAGNOSIS — I739 Peripheral vascular disease, unspecified: Secondary | ICD-10-CM | POA: Diagnosis not present

## 2017-01-06 DIAGNOSIS — E039 Hypothyroidism, unspecified: Secondary | ICD-10-CM | POA: Diagnosis not present

## 2017-01-06 DIAGNOSIS — I10 Essential (primary) hypertension: Secondary | ICD-10-CM | POA: Diagnosis not present

## 2017-01-06 DIAGNOSIS — F329 Major depressive disorder, single episode, unspecified: Secondary | ICD-10-CM | POA: Diagnosis not present

## 2017-01-06 DIAGNOSIS — F0281 Dementia in other diseases classified elsewhere with behavioral disturbance: Secondary | ICD-10-CM | POA: Diagnosis not present

## 2017-01-06 DIAGNOSIS — I739 Peripheral vascular disease, unspecified: Secondary | ICD-10-CM | POA: Diagnosis not present

## 2017-01-06 DIAGNOSIS — S51811D Laceration without foreign body of right forearm, subsequent encounter: Secondary | ICD-10-CM | POA: Diagnosis not present

## 2017-01-11 ENCOUNTER — Other Ambulatory Visit: Payer: Self-pay | Admitting: Nurse Practitioner

## 2017-01-11 DIAGNOSIS — F332 Major depressive disorder, recurrent severe without psychotic features: Secondary | ICD-10-CM

## 2017-01-15 DIAGNOSIS — F0281 Dementia in other diseases classified elsewhere with behavioral disturbance: Secondary | ICD-10-CM | POA: Diagnosis not present

## 2017-01-15 DIAGNOSIS — F329 Major depressive disorder, single episode, unspecified: Secondary | ICD-10-CM | POA: Diagnosis not present

## 2017-01-15 DIAGNOSIS — S51811D Laceration without foreign body of right forearm, subsequent encounter: Secondary | ICD-10-CM | POA: Diagnosis not present

## 2017-01-15 DIAGNOSIS — I739 Peripheral vascular disease, unspecified: Secondary | ICD-10-CM | POA: Diagnosis not present

## 2017-01-15 DIAGNOSIS — E039 Hypothyroidism, unspecified: Secondary | ICD-10-CM | POA: Diagnosis not present

## 2017-01-15 DIAGNOSIS — I10 Essential (primary) hypertension: Secondary | ICD-10-CM | POA: Diagnosis not present

## 2017-01-21 ENCOUNTER — Telehealth: Payer: Self-pay | Admitting: Nurse Practitioner

## 2017-01-21 NOTE — Telephone Encounter (Signed)
Left message with patient's daughter asking her to call me at [phone] to schedule her mom's AWV and CPE. VDM (DD)

## 2017-03-05 ENCOUNTER — Telehealth: Payer: Self-pay

## 2017-03-05 NOTE — Telephone Encounter (Signed)
Called patient to try to schedule AWV in the office for tomorrow at 9:15 before her PCP appointment. No answer-left voicemail to call back.

## 2017-03-06 ENCOUNTER — Ambulatory Visit (INDEPENDENT_AMBULATORY_CARE_PROVIDER_SITE_OTHER): Payer: Medicare HMO | Admitting: Nurse Practitioner

## 2017-03-06 ENCOUNTER — Ambulatory Visit (INDEPENDENT_AMBULATORY_CARE_PROVIDER_SITE_OTHER): Payer: Medicare HMO

## 2017-03-06 ENCOUNTER — Encounter: Payer: Self-pay | Admitting: Nurse Practitioner

## 2017-03-06 VITALS — BP 140/80 | HR 79 | Temp 98.0°F | Ht 64.0 in | Wt 138.0 lb

## 2017-03-06 DIAGNOSIS — F332 Major depressive disorder, recurrent severe without psychotic features: Secondary | ICD-10-CM | POA: Diagnosis not present

## 2017-03-06 DIAGNOSIS — N183 Chronic kidney disease, stage 3 unspecified: Secondary | ICD-10-CM

## 2017-03-06 DIAGNOSIS — Z23 Encounter for immunization: Secondary | ICD-10-CM | POA: Diagnosis not present

## 2017-03-06 DIAGNOSIS — F0281 Dementia in other diseases classified elsewhere with behavioral disturbance: Secondary | ICD-10-CM | POA: Diagnosis not present

## 2017-03-06 DIAGNOSIS — Z0000 Encounter for general adult medical examination without abnormal findings: Secondary | ICD-10-CM

## 2017-03-06 DIAGNOSIS — G308 Other Alzheimer's disease: Secondary | ICD-10-CM | POA: Diagnosis not present

## 2017-03-06 DIAGNOSIS — E039 Hypothyroidism, unspecified: Secondary | ICD-10-CM

## 2017-03-06 DIAGNOSIS — I10 Essential (primary) hypertension: Secondary | ICD-10-CM

## 2017-03-06 DIAGNOSIS — R600 Localized edema: Secondary | ICD-10-CM | POA: Diagnosis not present

## 2017-03-06 LAB — BASIC METABOLIC PANEL WITH GFR
BUN/Creatinine Ratio: 17 (calc) (ref 6–22)
BUN: 19 mg/dL (ref 7–25)
CO2: 26 mmol/L (ref 20–32)
Calcium: 9.3 mg/dL (ref 8.6–10.4)
Chloride: 106 mmol/L (ref 98–110)
Creat: 1.1 mg/dL — ABNORMAL HIGH (ref 0.60–0.88)
GFR, Est African American: 54 mL/min/{1.73_m2} — ABNORMAL LOW (ref >=60)
GFR, Est Non African American: 46 mL/min/{1.73_m2} — ABNORMAL LOW (ref >=60)
Glucose, Bld: 81 mg/dL (ref 65–99)
Potassium: 3.9 mmol/L (ref 3.5–5.3)
Sodium: 142 mmol/L (ref 135–146)

## 2017-03-06 LAB — TSH: TSH: 1.8 mIU/L (ref 0.40–4.50)

## 2017-03-06 MED ORDER — AMLODIPINE BESYLATE 5 MG PO TABS: 5.0000 mg | ORAL_TABLET | Freq: Every day | ORAL | 3 refills | Status: DC

## 2017-03-06 MED ORDER — AMITRIPTYLINE HCL 50 MG PO TABS: 50.0000 mg | ORAL_TABLET | Freq: Every day | ORAL | 3 refills | Status: DC

## 2017-03-06 MED ORDER — AMLODIPINE BESYLATE 10 MG PO TABS: 10.0000 mg | ORAL_TABLET | Freq: Every day | ORAL | 3 refills | Status: DC

## 2017-03-06 MED ORDER — VENLAFAXINE HCL ER 75 MG PO CP24: 75.0000 mg | ORAL_CAPSULE | Freq: Every day | ORAL | 3 refills | Status: DC

## 2017-03-06 MED ORDER — LEVOTHYROXINE SODIUM 88 MCG PO TABS: ORAL_TABLET | ORAL | 3 refills | Status: DC

## 2017-03-06 MED ORDER — TETANUS-DIPHTH-ACELL PERTUSSIS 5-2.5-18.5 LF-MCG/0.5 IM SUSP: 0.5000 mL | Freq: Once | INTRAMUSCULAR | 0 refills | Status: AC

## 2017-03-06 MED ORDER — LOSARTAN POTASSIUM 50 MG PO TABS: 50.0000 mg | ORAL_TABLET | Freq: Every day | ORAL | 3 refills | Status: DC

## 2017-03-06 MED ORDER — ZOSTER VAC RECOMB ADJUVANTED 50 MCG/0.5ML IM SUSR: 0.5000 mL | Freq: Once | INTRAMUSCULAR | 1 refills | Status: AC

## 2017-03-06 NOTE — Patient Instructions (Signed)
Make sure legs are elevated above the level of the heart during the day as much as possible. Compression hose- on in the morning and off in the evening.  Decrease sodium- process foods, canned foods/frozen meals are high in sodium  To decrease norvasc to 5 mg daily (blood pressure)- cut 10 mg 1/2 or new prescription sent to pharmacy Start losartan 50 mg daily for blood pressure

## 2017-03-06 NOTE — Patient Instructions (Signed)
[name] , Thank you for taking time to come for your Medicare Wellness Visit. I appreciate your ongoing commitment to your health goals. Please review the following plan we discussed and let me know if I can assist you in the future.   Screening recommendations/referrals: Colonoscopy excluded, you are over age 82 Mammogram excluded, you are over age 82 Bone Density up to date Recommended yearly ophthalmology/optometry visit for glaucoma screening and checkup Recommended yearly dental visit for hygiene and checkup  Vaccinations: Influenza vaccine up to date. Due 2019 fall season Pneumococcal vaccine 23 due, given today Tdap vaccine due, prescription sent to pharmacy Shingles vaccine due, prescription sent to pharmacy    Advanced directives: Please bring [place] a copy of your living will and health care power of attorney  Conditions/risks identified: none  Next appointment: Tyson Dense, RN 03/08/18 @ 1:45pm   Preventive Care 65 Years and Older, Female Preventive care refers to lifestyle choices and visits with your health care provider that can promote health and wellness. What does preventive care include?  A yearly physical exam. This is also called an annual well check.  Dental exams once or twice a year.  Routine eye exams. Ask your health care provider how often you should have your eyes checked.  Personal lifestyle choices, including:  Daily care of your teeth and gums.  Regular physical activity.  Eating a healthy diet.  Avoiding tobacco and drug use.  Limiting alcohol use.  Practicing safe sex.  Taking low-dose aspirin every day.  Taking vitamin and mineral supplements as recommended by your health care provider. What happens during an annual well check? The services and screenings done by your health care provider during your annual well check will depend on your age, overall health, lifestyle risk factors, and family history of disease. Counseling  Your  health care provider may ask you questions about your:  Alcohol use.  Tobacco use.  Drug use.  Emotional well-being.  Home and relationship well-being.  Sexual activity.  Eating habits.  History of falls.  Memory and ability to understand (cognition).  Work and work Statistician.  Reproductive health. Screening  You may have the following tests or measurements:  Height, weight, and BMI.  Blood pressure.  Lipid and cholesterol levels. These may be checked every 5 years, or more frequently if you are over 67 years old.  Skin check.  Lung cancer screening. You may have this screening every year starting at age 87 if you have a 30-pack-year history of smoking and currently smoke or have quit within the past 15 years.  Fecal occult blood test (FOBT) of the stool. You may have this test every year starting at age 2.  Flexible sigmoidoscopy or colonoscopy. You may have a sigmoidoscopy every 5 years or a colonoscopy every 10 years starting at age 54.  Hepatitis C blood test.  Hepatitis B blood test.  Sexually transmitted disease (STD) testing.  Diabetes screening. This is done by checking your blood sugar (glucose) after you have not eaten for a while (fasting). You may have this done every 1-3 years.  Bone density scan. This is done to screen for osteoporosis. You may have this done starting at age 107.  Mammogram. This may be done every 1-2 years. Talk to your health care provider about how often you should have regular mammograms. Talk with your health care provider about your test results, treatment options, and if necessary, the need for more tests. Vaccines  Your health care provider may  recommend certain vaccines, such as:  Influenza vaccine. This is recommended every year.  Tetanus, diphtheria, and acellular pertussis (Tdap, Td) vaccine. You may need a Td booster every 10 years.  Zoster vaccine. You may need this after age 13.  Pneumococcal 13-valent  conjugate (PCV13) vaccine. One dose is recommended after age 47.  Pneumococcal polysaccharide (PPSV23) vaccine. One dose is recommended after age 22. Talk to your health care provider about which screenings and vaccines you need and how often you need them. This information is not intended to replace advice given to you by your health care provider. Make sure you discuss any questions you have with your health care provider. Document Released: 03/02/2015 Document Revised: 10/24/2015 Document Reviewed: 12/05/2014 Elsevier Interactive Patient Education  2017 [place] Prevention in the Home Falls can cause injuries. They can happen to people of all ages. There are many things you can do to make your home safe and to help prevent falls. What can I do on the outside of my home?  Regularly fix the edges of walkways and driveways and fix any cracks.  Remove anything that might make you trip as you walk through a door, such as a raised step or threshold.  Trim any bushes or trees on the path to your home.  Use bright outdoor lighting.  Clear any walking paths of anything that might make someone trip, such as rocks or tools.  Regularly check to see if handrails are loose or broken. Make sure that both sides of any steps have handrails.  Any raised decks and porches should have guardrails on the edges.  Have any leaves, snow, or ice cleared regularly.  Use sand or salt on walking paths during winter.  Clean up any spills in your garage right away. This includes oil or grease spills. What can I do in the bathroom?  Use night lights.  Install grab bars by the toilet and in the tub and shower. Do not use towel bars as grab bars.  Use non-skid mats or decals in the tub or shower.  If you need to sit down in the shower, use a plastic, non-slip stool.  Keep the floor dry. Clean up any water that spills on the floor as soon as it happens.  Remove soap buildup in the tub or  shower regularly.  Attach bath mats securely with double-sided non-slip rug tape.  Do not have throw rugs and other things on the floor that can make you trip. What can I do in the bedroom?  Use night lights.  Make sure that you have a light by your bed that is easy to reach.  Do not use any sheets or blankets that are too big for your bed. They should not hang down onto the floor.  Have a firm chair that has side arms. You can use this for support while you get dressed.  Do not have throw rugs and other things on the floor that can make you trip. What can I do in the kitchen?  Clean up any spills right away.  Avoid walking on wet floors.  Keep items that you use a lot in easy-to-reach places.  If you need to reach something above you, use a strong step stool that has a grab bar.  Keep electrical cords out of the way.  Do not use floor polish or wax that makes floors slippery. If you must use wax, use non-skid floor wax.  Do not have throw rugs and  other things on the floor that can make you trip. What can I do with my stairs?  Do not leave any items on the stairs.  Make sure that there are handrails on both sides of the stairs and use them. Fix handrails that are broken or loose. Make sure that handrails are as long as the stairways.  Check any carpeting to make sure that it is firmly attached to the stairs. Fix any carpet that is loose or worn.  Avoid having throw rugs at the top or bottom of the stairs. If you do have throw rugs, attach them to the floor with carpet tape.  Make sure that you have a light switch at the top of the stairs and the bottom of the stairs. If you do not have them, ask someone to add them for you. What else can I do to help prevent falls?  Wear shoes that:  Do not have high heels.  Have rubber bottoms.  Are comfortable and fit you well.  Are closed at the toe. Do not wear sandals.  If you use a stepladder:  Make sure that it is fully  opened. Do not climb a closed stepladder.  Make sure that both sides of the stepladder are locked into place.  Ask someone to hold it for you, if possible.  Clearly mark and make sure that you can see:  Any grab bars or handrails.  First and last steps.  Where the edge of each step is.  Use tools that help you move around (mobility aids) if they are needed. These include:  Canes.  Walkers.  Scooters.  Crutches.  Turn on the lights when you go into a dark area. Replace any light bulbs as soon as they burn out.  Set up your furniture so you have a clear path. Avoid moving your furniture around.  If any of your floors are uneven, fix them.  If there are any pets around you, be aware of where they are.  Review your medicines with your doctor. Some medicines can make you feel dizzy. This can increase your chance of falling. Ask your doctor what other things that you can do to help prevent falls. This information is not intended to replace advice given to you by your health care provider. Make sure you discuss any questions you have with your health care provider. Document Released: 11/30/2008 Document Revised: 07/12/2015 Document Reviewed: 03/10/2014 Elsevier Interactive Patient Education  2017 [employer].

## 2017-03-06 NOTE — Progress Notes (Signed)
Subjective:   [name] is a 84 y.o. female who presents for Medicare Annual (Subsequent) preventive examination.  Last AWV-11/27/2015       Objective:     Vitals: BP 140/80 (BP Location: Left Arm, Patient Position: Sitting)   Pulse 79   Temp 98 F (36.7 C) (Oral)   Ht 5\' 4"  (1.626 m)   Wt 138 lb (62.6 kg)   SpO2 92%   BMI 23.69 kg/m   Body mass index is 23.69 kg/m.  Advanced Directives 03/06/2017 12/10/2016 11/16/2016 11/14/2016 11/11/2016 11/10/2016 05/13/2016  Does Patient Have a Medical Advance Directive? Yes Yes No Yes Yes Yes Yes  Type of Paramedic of [place];Living will;Out of facility DNR (pink MOST or yellow form) Healthcare Power of [place] of facility DNR (pink MOST or yellow form) Out of facility DNR (pink MOST or yellow form) Out of facility DNR (pink MOST or yellow form) - Healthcare Power of Attorney  Does patient want to make changes to medical advance directive? No - Patient declined - No - Patient declined - No - Patient declined No - Patient declined -  Copy of [place] in Chart? No - copy requested Yes Yes - - - Yes  Would patient like information on creating a medical advance directive? - - - - - - -  Pre-existing out of facility DNR order (yellow form or pink MOST form) Yellow form placed in chart (order not valid for inpatient use) - Yellow form placed in chart (order not valid for inpatient use) - Yellow form placed in chart (order not valid for inpatient use) - -    Tobacco Social History   Tobacco Use  Smoking Status Never Smoker  Smokeless Tobacco Never Used     Counseling given: Not Answered   Clinical Intake:  Pre-visit preparation completed: No  Pain : No/denies pain     Nutritional Risks: None Diabetes: No  How often do you need to have someone help you when you read instructions, pamphlets, or other written materials from your doctor or pharmacy?: 1 - Never What is the last grade  level you completed in school?: 10th grade  Interpreter Needed?: No  Information entered by :: Theodoro Doing, RN  Past Medical History:  Diagnosis Date  . Abnormality of gait   . Alzheimer's disease   . Calculus of kidney   . Closed fracture of seventh cervical vertebra without mention of spinal cord injury   . Depressive disorder, not elsewhere classified   . Edema   . Edema   . External hemorrhoids without mention of complication   . Gastric ulcer, unspecified as acute or chronic, without mention of hemorrhage, perforation, or obstruction   . GERD (gastroesophageal reflux disease)   . Hypertension   . Insomnia, unspecified   . Loss of weight   . Osteoarthrosis, unspecified whether generalized or localized, unspecified site   . Other protein-calorie malnutrition   . Pain in thoracic spine   . Personal history of fall   . Unspecified constipation   . Unspecified hereditary and idiopathic peripheral neuropathy   . Unspecified hypothyroidism   . Unspecified vitamin D deficiency   . Urinary frequency    Past Surgical History:  Procedure Laterality Date  . TEE WITHOUT CARDIOVERSION N/A 11/13/2016   Procedure: TRANSESOPHAGEAL ECHOCARDIOGRAM (TEE);  Surgeon: Jerline Pain, MD;  Location: [facility] ENDOSCOPY;  Service: Cardiovascular;  Laterality: N/A;  . WRIST SURGERY Right 2013   Family History  Problem Relation Age of Onset  . Cancer Mother   . CVA Father   . Cancer Brother   . Alcohol abuse Sister    Social History   Socioeconomic History  . Marital status: Widowed    Spouse name: None  . Number of children: None  . Years of education: None  . Highest education level: None  Social Needs  . Financial resource strain: Not hard at all  . Food insecurity - worry: Never true  . Food insecurity - inability: Never true  . Transportation needs - medical: No  . Transportation needs - non-medical: No  Occupational History  . None  Tobacco Use  . Smoking status: Never Smoker    . Smokeless tobacco: Never Used  Substance and Sexual Activity  . Alcohol use: No  . Drug use: No  . Sexual activity: No  Other Topics Concern  . None  Social History Narrative  . None    Outpatient Encounter Medications as of 03/06/2017  Medication Sig  . amitriptyline (ELAVIL) 50 MG tablet Take 1 tablet (50 mg total) by mouth at bedtime.  Marland Kitchen amLODipine (NORVASC) 10 MG tablet Take 1 tablet (10 mg total) by mouth daily.  Marland Kitchen levothyroxine (SYNTHROID, LEVOTHROID) 88 MCG tablet Take one tablet by mouth once daily 30 minutes before breakfast for thyroid  . Memantine HCl-Donepezil HCl (NAMZARIC) 28-10 MG CP24 Take 1 capsule by mouth at bedtime. to stabilize memory  . senna-docusate (SENNA S) 8.6-50 MG tablet Take 1 tablet by mouth at bedtime.  Marland Kitchen venlafaxine XR (EFFEXOR-XR) 75 MG 24 hr capsule Take 1 capsule (75 mg total) by mouth daily with breakfast.  . Vitamin D, Ergocalciferol, (DRISDOL) 50000 units CAPS capsule Take one capsule by mouth every 14 days  . [DISCONTINUED] amitriptyline (ELAVIL) 50 MG tablet Take 1 tablet (50 mg total) by mouth at bedtime.  . [DISCONTINUED] amLODipine (NORVASC) 10 MG tablet Take 1 tablet (10 mg total) by mouth daily.  . [DISCONTINUED] levothyroxine (SYNTHROID, LEVOTHROID) 88 MCG tablet Take one tablet by mouth once daily 30 minutes before breakfast for thyroid  . [DISCONTINUED] venlafaxine XR (EFFEXOR-XR) 75 MG 24 hr capsule TAKE 1 CAPSULE BY MOUTH EVERY DAY WITH BREAKFAST  . feeding supplement, ENSURE ENLIVE, (ENSURE ENLIVE) LIQD Take 237 mLs by mouth 2 (two) times daily between meals. (Patient not taking: Reported on 03/06/2017)   No facility-administered encounter medications on file as of 03/06/2017.     Activities of Daily Living In your present state of health, do you have any difficulty performing the following activities: 03/06/2017 11/11/2016  Hearing? N N  Vision? Y N  Difficulty concentrating or making decisions? Tempie Donning  Walking or climbing stairs? Y Y   Dressing or bathing? Y N  Doing errands, shopping? Tempie Donning  Preparing Food and eating ? Y -  Using the Toilet? N -  In the past six months, have you accidently leaked urine? Y -  Do you have problems with loss of bowel control? Y -  Managing your Medications? Y -  Managing your Finances? Y -  Housekeeping or managing your Housekeeping? Y -  Some recent data might be hidden    Patient Care Team: [name], NP as PCP - General (Geriatric Medicine)    Assessment:   This is a routine wellness examination for [name].  Exercise Activities and Dietary recommendations Current Exercise Habits: Home exercise routine, Type of exercise: walking, Time (Minutes): 30, Frequency (Times/Week): 3, Weekly Exercise (Minutes/Week): 90, Intensity: Mild,  Exercise limited by: None identified  Goals    None      Fall Risk Fall Risk  03/06/2017 03/06/2017 12/10/2016 05/13/2016 03/03/2016  Falls in the past year? Yes Yes Yes Yes Yes  Number falls in past yr: 2 or more 2 or more 2 or more 1 2 or more  Comment - - Pt was having multiple falls daily-no falls since discharge - -  Injury with Fall? Yes No Yes Yes No  Comment - - skin tears, bruises - -  Risk Factor Category  - - - - -  Risk for fall due to : - - - - -   Is the patient's home free of loose throw rugs in walkways, pet beds, electrical cords, etc?   yes      Grab bars in the bathroom? yes      Handrails on the stairs?   yes      Adequate lighting?   yes  Timed Get Up and Go performed: 24 seconds, fall risk  Depression Screen PHQ 2/9 Scores 03/06/2017 03/06/2017 12/10/2016 11/27/2015  PHQ - 2 Score 0 0 0 6  PHQ- 9 Score - - - 18     Cognitive Function MMSE - Mini Mental State Exam 11/27/2015 03/30/2014  Orientation to time 0 2  Orientation to Place 2 2  Registration 3 3  Attention/ Calculation 0 0  Recall 0 0  Language- name 2 objects 2 2  Language- repeat 1 1  Language- follow 3 step command 3 3  Language- read & follow  direction 1 1  Write a sentence 1 0  Copy design 0 0  Total score 13 14        Immunization History  Administered Date(s) Administered  . Influenza, High Dose Seasonal PF 12/10/2016  . Influenza,inj,Quad PF,6+ Mos 11/27/2015  . PPD Test 01/22/2015  . Pneumococcal Conjugate-13 11/27/2015    Qualifies for Shingles Vaccine? Yes, educated and prescription sent to pharmacy  Screening Tests Health Maintenance  Topic Date Due  . TETANUS/TDAP  03/17/1950  . PNA vac Low Risk Adult (2 of 2 - PPSV23) 11/26/2016  . INFLUENZA VACCINE  Completed  . DEXA SCAN  Completed    Cancer Screenings: Lung: Low Dose CT Chest recommended if Age 37-80 years, 30 pack-year currently smoking OR have quit w/in 15years. Patient does not qualify. Breast:  Up to date on Mammogram? Yes   Up to date of Bone Density/Dexa? Yes Colorectal: up to date  Additional Screenings:  Hepatitis B/HIV/Syphillis: declined Hepatitis C Screening: declined     Plan:    I have personally reviewed and addressed the Medicare Annual Wellness questionnaire and have noted the following in the patient's chart:  A. Medical and social history B. Use of alcohol, tobacco or illicit drugs  C. Current medications and supplements D. Functional ability and status E.  Nutritional status F.  Physical activity G. Advance directives H. List of other physicians I.  Hospitalizations, surgeries, and ER visits in previous 12 months J.  [place] to include hearing, vision, cognitive, depression L. Referrals and appointments - none  In addition, I have reviewed and discussed with patient certain preventive protocols, quality metrics, and best practice recommendations. A written personalized care plan for preventive services as well as general preventive health recommendations were provided to patient.  See attached scanned questionnaire for additional information.   Signed,   Tyson Dense, RN Nurse Health Advisor    Quick Notes   Health Maintenance:  PNA 23 given today. tdap and shingrix prescriptions sent to pharmacy. Refilled medications     Abnormal Screen: MMSE 18/30. Pt did not pass clock drawing     Patient Concerns: Doesn't think dementia or thyroid medications are working     Nurse Concerns: None

## 2017-03-06 NOTE — Progress Notes (Signed)
Careteam: Patient Care Team: [name], NP as PCP - General (Geriatric Medicine)  Advanced Directive information    No Known Allergies  Chief Complaint  Patient presents with  . Acute Visit    swelling in both feet  . Medical Management of Chronic Issues    Routine check-up     HPI: Patient is a 84 y.o. female seen in the office today for routine follow up and swelling in bilateral feet.  Pt was last seen in October after follow up form SNF after hospitalization for slurred speech and multiple falls.  Blood pressure was elevated and norvasc increase to 10 mg daily   Reports swelling in leg have been going on for 2 months.  No shortness of breath or chest pain.   Lives with daughter who is bedbound and can not help her.  Has help coming in a few hours a day which prepares meals.  Sits with leg dependant throughout the day  Friend is here with here Reports she eats a lot of chips, eats a lot of foods high in sweets No one cooks for her per friend, eats a lot of processed food.    Review of Systems: provided by pt and friend  Review of Systems  Constitutional: Negative for chills, fever and weight loss.  HENT: Negative for tinnitus.   Respiratory: Negative for cough, sputum production and shortness of breath.   Cardiovascular: Positive for leg swelling. Negative for chest pain and palpitations.  Gastrointestinal: Negative for abdominal pain, constipation, diarrhea and heartburn.  Genitourinary: Negative for dysuria, frequency and urgency.  Musculoskeletal: Positive for falls and joint pain. Negative for back pain and myalgias.  Skin: Negative.   Neurological: Positive for weakness. Negative for dizziness and headaches.  Psychiatric/Behavioral: Positive for memory loss. Negative for depression. The patient does not have insomnia.     Past Medical History:  Diagnosis Date  . Abnormality of gait   . Alzheimer's disease   . Calculus of kidney   . Closed  fracture of seventh cervical vertebra without mention of spinal cord injury   . Depressive disorder, not elsewhere classified   . Edema   . Edema   . External hemorrhoids without mention of complication   . Gastric ulcer, unspecified as acute or chronic, without mention of hemorrhage, perforation, or obstruction   . GERD (gastroesophageal reflux disease)   . Hypertension   . Insomnia, unspecified   . Loss of weight   . Osteoarthrosis, unspecified whether generalized or localized, unspecified site   . Other protein-calorie malnutrition   . Pain in thoracic spine   . Personal history of fall   . Unspecified constipation   . Unspecified hereditary and idiopathic peripheral neuropathy   . Unspecified hypothyroidism   . Unspecified vitamin D deficiency   . Urinary frequency    Past Surgical History:  Procedure Laterality Date  . TEE WITHOUT CARDIOVERSION N/A 11/13/2016   Procedure: TRANSESOPHAGEAL ECHOCARDIOGRAM (TEE);  Surgeon: Jerline Pain, MD;  Location: [facility] ENDOSCOPY;  Service: Cardiovascular;  Laterality: N/A;  . WRIST SURGERY Right 2013   Social History:   reports that  has never smoked. she has never used smokeless tobacco. She reports that she does not drink alcohol or use drugs.  Family History  Problem Relation Age of Onset  . Cancer Mother   . CVA Father   . Cancer Brother   . Alcohol abuse Sister     Medications: Patient's Medications  New Prescriptions  No medications on file  Previous Medications   AMITRIPTYLINE (ELAVIL) 50 MG TABLET    Take 1 tablet (50 mg total) by mouth at bedtime.   AMLODIPINE (NORVASC) 10 MG TABLET    Take 1 tablet (10 mg total) by mouth daily.   FEEDING SUPPLEMENT, ENSURE ENLIVE, (ENSURE ENLIVE) LIQD    Take 237 mLs by mouth 2 (two) times daily between meals.   LEVOTHYROXINE (SYNTHROID, LEVOTHROID) 88 MCG TABLET    Take one tablet by mouth once daily 30 minutes before breakfast for thyroid   MEMANTINE HCL-DONEPEZIL HCL (NAMZARIC) 28-10 MG  CP24    Take 1 capsule by mouth at bedtime. to stabilize memory   SENNA-DOCUSATE (SENNA S) 8.6-50 MG TABLET    Take 1 tablet by mouth at bedtime.   VENLAFAXINE XR (EFFEXOR-XR) 75 MG 24 HR CAPSULE    TAKE 1 CAPSULE BY MOUTH EVERY DAY WITH BREAKFAST   VITAMIN D, ERGOCALCIFEROL, (DRISDOL) 50000 UNITS CAPS CAPSULE    Take one capsule by mouth every 14 days  Modified Medications   No medications on file  Discontinued Medications   No medications on file     Physical Exam:  Vitals:   03/06/17 0849  BP: 140/80  Pulse: 79  Temp: 98 F (36.7 C)  TempSrc: Oral  SpO2: 92%  Weight: 138 lb (62.6 kg)   Body mass index is 23.69 kg/m.  Physical Exam  Constitutional: No distress.  Thin. Frail. female  HENT:  Head: Normocephalic and atraumatic.  Eyes: Conjunctivae and EOM are normal. Pupils are equal, round, and reactive to light.  Neck: Normal range of motion. Neck supple.  Cardiovascular: Normal rate, regular rhythm and normal heart sounds.  Pulmonary/Chest: Effort normal and breath sounds normal.  Abdominal: Soft. Bowel sounds are normal.  Rounded abdomen, soft, nontender  Musculoskeletal: Normal range of motion. She exhibits edema.  Neurological: She is alert. No cranial nerve deficit. Coordination abnormal.  Severe memory loss.  09/12/09 was 25/30.  MMSE 13/30 -- 11/2015  MMSE 18/30-02/2017  Skin: She is not diaphoretic.  Psychiatric:  Poor judgment. Impulsive.    Labs reviewed: Basic Metabolic Panel: Recent Labs    11/10/16 1309 11/10/16 1310  11/11/16 1846  11/13/16 0544 11/14/16 0806 12/10/16 1619  NA 138  --    < > 135   < > 138 137 138  K 3.2*  --    < > 3.1*   < > 3.5 3.4* 4.7  CL 105  --    < > 102   < > 106 106 102  CO2 24  --    < > 26   < > 24 23 28   GLUCOSE 96  --    < > 129*   < > 84 103* 87  BUN 19  --    < > 10   < > 13 19 18   CREATININE 0.91  --    < > 1.00   < > 0.98 1.47* 0.94*  CALCIUM 8.8*  --    < > 9.0   < > 8.9 8.9 9.2  MG 1.9  --   --  2.0  --    --   --   --   TSH  --  5.808*  --   --   --   --   --   --    < > = values in this interval not displayed.   Liver Function Tests: Recent Labs    11/10/16 1309 11/11/16  0725 11/11/16 1846 12/10/16 1619  AST 27 26 33 14  ALT 16 14 16 9   ALKPHOS 64 62 56  --   BILITOT 1.3* 1.2 1.1 0.5  PROT 6.6 6.4* 6.7 7.2  ALBUMIN 3.9 3.8 3.8  --    Recent Labs    11/10/16 1309  LIPASE 28   No results for input(s): AMMONIA in the last 8760 hours. CBC: Recent Labs    11/10/16 1309 11/10/16 2236 11/11/16 0725  WBC 7.0 7.9 7.1  NEUTROABS 4.8  --   --   HGB 12.2 12.2 13.4  HCT 34.6* 36.0 39.5  MCV 102.1* 102.3* 102.1*  PLT 204 206 227   Lipid Panel: Recent Labs    11/11/16 1846  CHOL 156  HDL 61  LDLCALC 87  TRIG 42  CHOLHDL 2.6   TSH: Recent Labs    11/10/16 1310  TSH 5.808*   A1C: Lab Results  Component Value Date   HGBA1C 5.2 11/11/2016     Assessment/Plan 1. Chronic kidney disease, stage 3 (HCC) -improved on recent labs, encouraged hydration to avoid NSAID  2. Essential hypertension -stable, but worsening LE edema, norvasc could be contributing which was increased at last OV. Will decrease and add losartan back since kidney function improved on last labs.  - losartan (COZAAR) 50 MG tablet; Take 1 tablet (50 mg total) by mouth daily.  Dispense: 90 tablet; Refill: 3 - amLODipine (NORVASC) 5 MG tablet; Take 1 tablet (5 mg total) by mouth daily.  Dispense: 90 tablet; Refill: 3 - BASIC METABOLIC PANEL WITH GFR  3. Alzheimer's disease of other onset with behavioral disturbance -overall appears stable. Now getting increase assistance at home.  -cont namzaric  4. Hypothyroidism, unspecified type -cont on synthroid, will follow up lab - TSH  5. Edema, lower extremity Worsening LE, this is most likely caused by several factors, poor nutritional intake, high sodium diet, feet dependent a lot of the day and medication.  -discussed lifestyle modifications and will  decrease norvasc at this time  Next appt: 4 week to follow up swelling and bp Carlos American. Harle Battiest  [facility] [phone] 8 am - 5 pm) [phone] (after hours)

## 2017-04-27 ENCOUNTER — Ambulatory Visit (INDEPENDENT_AMBULATORY_CARE_PROVIDER_SITE_OTHER): Payer: Medicare HMO | Admitting: Nurse Practitioner

## 2017-04-27 ENCOUNTER — Encounter: Payer: Self-pay | Admitting: Nurse Practitioner

## 2017-04-27 VITALS — BP 142/86 | HR 70 | Temp 98.3°F | Ht 64.0 in | Wt 144.0 lb

## 2017-04-27 DIAGNOSIS — K5900 Constipation, unspecified: Secondary | ICD-10-CM

## 2017-04-27 DIAGNOSIS — R8290 Unspecified abnormal findings in urine: Secondary | ICD-10-CM

## 2017-04-27 DIAGNOSIS — G308 Other Alzheimer's disease: Secondary | ICD-10-CM

## 2017-04-27 DIAGNOSIS — F0281 Dementia in other diseases classified elsewhere with behavioral disturbance: Secondary | ICD-10-CM

## 2017-04-27 DIAGNOSIS — I10 Essential (primary) hypertension: Secondary | ICD-10-CM

## 2017-04-27 DIAGNOSIS — R600 Localized edema: Secondary | ICD-10-CM

## 2017-04-27 DIAGNOSIS — F02818 Dementia in other diseases classified elsewhere, unspecified severity, with other behavioral disturbance: Secondary | ICD-10-CM

## 2017-04-27 LAB — POCT URINALYSIS DIPSTICK
Bilirubin, UA: NEGATIVE
Glucose, UA: NEGATIVE
Ketones, UA: NEGATIVE
Nitrite, UA: POSITIVE
Protein, UA: NEGATIVE
Spec Grav, UA: 1.015 (ref 1.010–1.025)
Urobilinogen, UA: 0.2 E.U./dL
pH, UA: 6 (ref 5.0–8.0)

## 2017-04-27 MED ORDER — AMLODIPINE BESYLATE 5 MG PO TABS: 5.0000 mg | ORAL_TABLET | Freq: Every day | ORAL | 3 refills | Status: DC

## 2017-04-27 NOTE — Patient Instructions (Addendum)
Increase water intake.  Will send urine off for culture.   We will call with appt for doppler of left lower leg

## 2017-04-27 NOTE — Progress Notes (Signed)
Careteam: Patient Care Team: [name], NP as PCP - General (Geriatric Medicine)  Advanced Directive information    No Known Allergies  Chief Complaint  Patient presents with  . Acute Visit    Pt is being seen due to having a strong urine odor for a few weeks. Pt is more unsteady and has more mood swings than normal. Pt also has swelling of left leg that is bothersome.   . Other    Daughter in room     HPI: Patient is a 84 y.o. female seen in the office today for possible UTI. Pt with hx of dementia and daughter reports urine has a stronger fouler odor.  Daughter reports urine always has an odor, does not drink a lot of fluid at baseline. This is unchanged.  More unsteady than normal and more falls. Was doing better with falls. This is worse recently.  Has had a UTI in the past so thought this may be what was going on.  Pt denies pain with urination.  No abdominal discomfort, no fever.  Behaviors are worse, these are ongoing but worse recently Wears depends because she has accidents of urine all the time for a couple of weeks/month.  Not incontinent of bowel.   Review of Systems:  Review of Systems  Unable to perform ROS: Dementia    Past Medical History:  Diagnosis Date  . Abnormality of gait   . Alzheimer's disease   . Calculus of kidney   . Closed fracture of seventh cervical vertebra without mention of spinal cord injury   . Depressive disorder, not elsewhere classified   . Edema   . Edema   . External hemorrhoids without mention of complication   . Gastric ulcer, unspecified as acute or chronic, without mention of hemorrhage, perforation, or obstruction   . GERD (gastroesophageal reflux disease)   . Hypertension   . Insomnia, unspecified   . Loss of weight   . Osteoarthrosis, unspecified whether generalized or localized, unspecified site   . Other protein-calorie malnutrition   . Pain in thoracic spine   . Personal history of fall   .  Unspecified constipation   . Unspecified hereditary and idiopathic peripheral neuropathy   . Unspecified hypothyroidism   . Unspecified vitamin D deficiency   . Urinary frequency    Past Surgical History:  Procedure Laterality Date  . TEE WITHOUT CARDIOVERSION N/A 11/13/2016   Procedure: TRANSESOPHAGEAL ECHOCARDIOGRAM (TEE);  Surgeon: Jerline Pain, MD;  Location: [facility] ENDOSCOPY;  Service: Cardiovascular;  Laterality: N/A;  . WRIST SURGERY Right 2013   Social History:   reports that  has never smoked. she has never used smokeless tobacco. She reports that she does not drink alcohol or use drugs.  Family History  Problem Relation Age of Onset  . Cancer Mother   . CVA Father   . Cancer Brother   . Alcohol abuse Sister     Medications: Patient's Medications  New Prescriptions   No medications on file  Previous Medications   AMITRIPTYLINE (ELAVIL) 50 MG TABLET    Take 1 tablet (50 mg total) by mouth at bedtime.   FEEDING SUPPLEMENT, ENSURE ENLIVE, (ENSURE ENLIVE) LIQD    Take 237 mLs by mouth 2 (two) times daily between meals.   LEVOTHYROXINE (SYNTHROID, LEVOTHROID) 88 MCG TABLET    Take one tablet by mouth once daily 30 minutes before breakfast for thyroid   LOSARTAN (COZAAR) 50 MG TABLET    Take 1 tablet (  50 mg total) by mouth daily.   MEMANTINE HCL-DONEPEZIL HCL (NAMZARIC) 28-10 MG CP24    Take 1 capsule by mouth at bedtime. to stabilize memory   SENNA-DOCUSATE (SENNA S) 8.6-50 MG TABLET    Take 1 tablet by mouth at bedtime.   VENLAFAXINE XR (EFFEXOR-XR) 75 MG 24 HR CAPSULE    Take 1 capsule (75 mg total) by mouth daily with breakfast.   VITAMIN D, ERGOCALCIFEROL, (DRISDOL) 50000 UNITS CAPS CAPSULE    Take one capsule by mouth every 14 days  Modified Medications   Modified Medication Previous Medication   AMLODIPINE (NORVASC) 5 MG TABLET amLODipine (NORVASC) 5 MG tablet      Take 1 tablet (5 mg total) by mouth daily.    Take 1 tablet (5 mg total) by mouth daily.  Discontinued  Medications   No medications on file     Physical Exam:  Vitals:   04/27/17 1542  BP: (!) 142/86  Pulse: 70  Temp: 98.3 F (36.8 C)  TempSrc: Oral  SpO2: 98%  Weight: 144 lb (65.3 kg)  Height: 5\' 4"  (1.626 m)   Body mass index is 24.72 kg/m.  Physical Exam  Constitutional: No distress.  Thin. Frail. female  HENT:  Head: Normocephalic and atraumatic.  Eyes: Conjunctivae and EOM are normal. Pupils are equal, round, and reactive to light.  Neck: Normal range of motion. Neck supple.  Cardiovascular: Normal rate, regular rhythm and normal heart sounds.  Pulmonary/Chest: Effort normal and breath sounds normal.  Abdominal: Soft. Bowel sounds are normal. She exhibits no distension. There is no tenderness.  Rounded abdomen, soft, nontender  Musculoskeletal: Normal range of motion. She exhibits edema (2+ left).  Tenderness to left calf on exam vs right  Neurological: She is alert. No cranial nerve deficit. Coordination abnormal.  Severe memory loss.  09/12/09 was 25/30.  MMSE 13/30 -- 11/2015  MMSE 18/30-02/2017  Skin: She is not diaphoretic.  Psychiatric:  Poor judgment. Impulsive.    Labs reviewed: Basic Metabolic Panel: Recent Labs    11/10/16 1309 11/10/16 1310  11/11/16 1846  11/14/16 0806 12/10/16 1619 03/06/17 1009  NA 138  --    < > 135   < > 137 138 142  K 3.2*  --    < > 3.1*   < > 3.4* 4.7 3.9  CL 105  --    < > 102   < > 106 102 106  CO2 24  --    < > 26   < > 23 28 26   GLUCOSE 96  --    < > 129*   < > 103* 87 81  BUN 19  --    < > 10   < > 19 18 19   CREATININE 0.91  --    < > 1.00   < > 1.47* 0.94* 1.10*  CALCIUM 8.8*  --    < > 9.0   < > 8.9 9.2 9.3  MG 1.9  --   --  2.0  --   --   --   --   TSH  --  5.808*  --   --   --   --   --  1.80   < > = values in this interval not displayed.   Liver Function Tests: Recent Labs    11/10/16 1309 11/11/16 0725 11/11/16 1846 12/10/16 1619  AST 27 26 33 14  ALT 16 14 16 9   ALKPHOS 64 62 56  --  BILITOT  1.3* 1.2 1.1 0.5  PROT 6.6 6.4* 6.7 7.2  ALBUMIN 3.9 3.8 3.8  --    Recent Labs    11/10/16 1309  LIPASE 28   No results for input(s): AMMONIA in the last 8760 hours. CBC: Recent Labs    11/10/16 1309 11/10/16 2236 11/11/16 0725  WBC 7.0 7.9 7.1  NEUTROABS 4.8  --   --   HGB 12.2 12.2 13.4  HCT 34.6* 36.0 39.5  MCV 102.1* 102.3* 102.1*  PLT 204 206 227   Lipid Panel: Recent Labs    11/11/16 1846  CHOL 156  HDL 61  LDLCALC 87  TRIG 42  CHOLHDL 2.6   TSH: Recent Labs    11/10/16 1310 03/06/17 1009  TSH 5.808* 1.80   A1C: Lab Results  Component Value Date   HGBA1C 5.2 11/11/2016     Assessment/Plan 1. Essential hypertension -stable, continue low sodium diet.  - amLODipine (NORVASC) 5 MG tablet; Take 1 tablet (5 mg total) by mouth daily.  Dispense: 90 tablet; Refill: 3  2. Foul smelling urine - POC Urinalysis Dipstick  3. Alzheimer's disease of other onset with behavioral disturbance Had previously been stable, recently with increase behaviors which happens from time to time but with combination of increase urinary odor and incontinence  wanted to have her evaluated.   4. Constipation, unspecified constipation type Ongoing, can increase senna to twice daily.   5. Leg edema, left -pt previously with bilateral LE edema but now with increase left leg edema and calf tenderness on exam. No redness noted and daughter reports overall swelling has improved. However will send for doppler r/o DVT - VAS [place] LOWER EXTREMITY VENOUS (DVT); Future  7. Abnormal urinalysis - will send for Culture, Urine; Future- due to positive for leukocytes, nitrites and blood. Encouraged proper hydration and pericare   Carlis Burnsworth K. [place], [facility] [phone]

## 2017-04-28 LAB — URINE CULTURE
MICRO NUMBER:: 90308094
SPECIMEN QUALITY:: ADEQUATE

## 2017-05-04 ENCOUNTER — Other Ambulatory Visit: Payer: Self-pay | Admitting: Nurse Practitioner

## 2017-05-04 DIAGNOSIS — F0281 Dementia in other diseases classified elsewhere with behavioral disturbance: Secondary | ICD-10-CM

## 2017-05-04 DIAGNOSIS — G301 Alzheimer's disease with late onset: Secondary | ICD-10-CM

## 2017-05-04 DIAGNOSIS — F332 Major depressive disorder, recurrent severe without psychotic features: Secondary | ICD-10-CM

## 2017-05-05 ENCOUNTER — Ambulatory Visit (HOSPITAL_COMMUNITY)
Admission: RE | Admit: 2017-05-05 | Discharge: 2017-05-05 | Disposition: A | Discharge status: Home / Self Care | Payer: Medicare HMO | Source: Ambulatory Visit | Attending: Vascular Surgery | Admitting: Vascular Surgery

## 2017-05-05 ENCOUNTER — Telehealth: Payer: Self-pay

## 2017-05-05 DIAGNOSIS — R600 Localized edema: Secondary | ICD-10-CM | POA: Diagnosis not present

## 2017-05-05 DIAGNOSIS — M7989 Other specified soft tissue disorders: Secondary | ICD-10-CM | POA: Diagnosis not present

## 2017-05-05 NOTE — Telephone Encounter (Signed)
No additional recommendations.  

## 2017-05-05 NOTE — Telephone Encounter (Signed)
Preliminary  Report on Vascular U/S ws negative for DVT, patient informed and aware [name], NP to call with additional recommendations.  Please advise

## 2017-05-05 NOTE — Telephone Encounter (Signed)
Discussed [name]'s response with [name] (patient's daughter)

## 2017-06-16 ENCOUNTER — Encounter: Payer: Self-pay | Admitting: Nurse Practitioner

## 2017-06-16 ENCOUNTER — Ambulatory Visit (INDEPENDENT_AMBULATORY_CARE_PROVIDER_SITE_OTHER): Payer: Medicare HMO | Admitting: Nurse Practitioner

## 2017-06-16 VITALS — BP 124/62 | HR 61 | Temp 98.1°F | Ht 64.0 in | Wt 144.4 lb

## 2017-06-16 DIAGNOSIS — F332 Major depressive disorder, recurrent severe without psychotic features: Secondary | ICD-10-CM | POA: Diagnosis not present

## 2017-06-16 DIAGNOSIS — S50311A Abrasion of right elbow, initial encounter: Secondary | ICD-10-CM

## 2017-06-16 DIAGNOSIS — L089 Local infection of the skin and subcutaneous tissue, unspecified: Secondary | ICD-10-CM | POA: Diagnosis not present

## 2017-06-16 MED ORDER — DOXYCYCLINE HYCLATE 100 MG PO TABS: 100.0000 mg | ORAL_TABLET | Freq: Two times a day (BID) | ORAL | 0 refills | Status: DC

## 2017-06-16 NOTE — Patient Instructions (Signed)
To start doxycyline twice daily for 10 days To take with food and start probiotic twice daily to avoid GI upset  Follow up in 2 weeks.

## 2017-06-16 NOTE — Progress Notes (Signed)
Careteam: Patient Care Team: [name], NP as PCP - General (Geriatric Medicine)  Advanced Directive information    No Known Allergies  Chief Complaint  Patient presents with  . Acute Visit    Complains of Skin tear on right elbow from fall 2 weeks ago. Skin tear red and sore.      HPI: Patient is a 84 y.o. female seen in the office today due to red painful elbow after fall 2 weeks ago. Having multiple falls. Daughter is usure of what happened with fall but pt refuses to use walker or assistive device.  Daughter noticed redness that started yesterday.  Pt reports it is painful but has had more painful things.  No fever or chills.  No drainage noted.  Hurts with movement   Review of Systems:  Review of Systems  Constitutional: Negative for chills and fever.  Respiratory: Negative for shortness of breath.   Cardiovascular: Negative for chest pain.  Musculoskeletal: Positive for falls, joint pain and myalgias.  Psychiatric/Behavioral: Positive for memory loss.    Past Medical History:  Diagnosis Date  . Abnormality of gait   . Alzheimer's disease   . Calculus of kidney   . Closed fracture of seventh cervical vertebra without mention of spinal cord injury   . Depressive disorder, not elsewhere classified   . Edema   . Edema   . External hemorrhoids without mention of complication   . Gastric ulcer, unspecified as acute or chronic, without mention of hemorrhage, perforation, or obstruction   . GERD (gastroesophageal reflux disease)   . Hypertension   . Insomnia, unspecified   . Loss of weight   . Osteoarthrosis, unspecified whether generalized or localized, unspecified site   . Other protein-calorie malnutrition   . Pain in thoracic spine   . Personal history of fall   . Unspecified constipation   . Unspecified hereditary and idiopathic peripheral neuropathy   . Unspecified hypothyroidism   . Unspecified vitamin D deficiency   . Urinary frequency     Past Surgical History:  Procedure Laterality Date  . TEE WITHOUT CARDIOVERSION N/A 11/13/2016   Procedure: TRANSESOPHAGEAL ECHOCARDIOGRAM (TEE);  Surgeon: Jerline Pain, MD;  Location: [facility] ENDOSCOPY;  Service: Cardiovascular;  Laterality: N/A;  . WRIST SURGERY Right 2013   Social History:   reports that she has never smoked. She has never used smokeless tobacco. She reports that she does not drink alcohol or use drugs.  Family History  Problem Relation Age of Onset  . Cancer Mother   . CVA Father   . Cancer Brother   . Alcohol abuse Sister     Medications: Patient's Medications  New Prescriptions   No medications on file  Previous Medications   AMITRIPTYLINE (ELAVIL) 50 MG TABLET    Take 1 tablet (50 mg total) by mouth at bedtime.   AMLODIPINE (NORVASC) 5 MG TABLET    Take 1 tablet (5 mg total) by mouth daily.   FEEDING SUPPLEMENT, ENSURE ENLIVE, (ENSURE ENLIVE) LIQD    Take 237 mLs by mouth 2 (two) times daily between meals.   LEVOTHYROXINE (SYNTHROID, LEVOTHROID) 88 MCG TABLET    TAKE ONE TABLET  ONCE DAILY 30 MINUTES BEFORE BREAKFAST FOR THYROID   NAMZARIC 28-10 MG CP24    TAKE 1 CAPSULE AT BEDTIME. TO STABILIZE MEMORY   SENNA-DOCUSATE (SENNA S) 8.6-50 MG TABLET    Take 1 tablet by mouth at bedtime.   VENLAFAXINE XR (EFFEXOR-XR) 75 MG 24 HR CAPSULE  TAKE 1 CAPSULE  EVERY DAY WITH BREAKFAST   VITAMIN D, ERGOCALCIFEROL, (DRISDOL) 50000 UNITS CAPS CAPSULE    Take one capsule by mouth every 14 days  Modified Medications   No medications on file  Discontinued Medications   LOSARTAN (COZAAR) 50 MG TABLET    Take 1 tablet (50 mg total) by mouth daily.     Physical Exam:  Vitals:   06/16/17 1310  BP: 124/62  Pulse: 61  Temp: 98.1 F (36.7 C)  TempSrc: Oral  Weight: 144 lb 6.4 oz (65.5 kg)  Height: 5\' 4"  (1.626 m)   Body mass index is 24.79 kg/m.  Physical Exam  Constitutional:  Frail elderly female  HENT:  Head: Normocephalic and atraumatic.  Cardiovascular:  Normal rate, regular rhythm and normal heart sounds.  Pulmonary/Chest: Effort normal and breath sounds normal.  Musculoskeletal:       Right elbow: She exhibits decreased range of motion, swelling (with redness noted) and deformity (abrasion ). Tenderness found.    Labs reviewed: Basic Metabolic Panel: Recent Labs    11/10/16 1309 11/10/16 1310  11/11/16 1846  11/14/16 0806 12/10/16 1619 03/06/17 1009  NA 138  --    < > 135   < > 137 138 142  K 3.2*  --    < > 3.1*   < > 3.4* 4.7 3.9  CL 105  --    < > 102   < > 106 102 106  CO2 24  --    < > 26   < > 23 28 26   GLUCOSE 96  --    < > 129*   < > 103* 87 81  BUN 19  --    < > 10   < > 19 18 19   CREATININE 0.91  --    < > 1.00   < > 1.47* 0.94* 1.10*  CALCIUM 8.8*  --    < > 9.0   < > 8.9 9.2 9.3  MG 1.9  --   --  2.0  --   --   --   --   TSH  --  5.808*  --   --   --   --   --  1.80   < > = values in this interval not displayed.   Liver Function Tests: Recent Labs    11/10/16 1309 11/11/16 0725 11/11/16 1846 12/10/16 1619  AST 27 26 33 14  ALT 16 14 16 9   ALKPHOS 64 62 56  --   BILITOT 1.3* 1.2 1.1 0.5  PROT 6.6 6.4* 6.7 7.2  ALBUMIN 3.9 3.8 3.8  --    Recent Labs    11/10/16 1309  LIPASE 28   No results for input(s): AMMONIA in the last 8760 hours. CBC: Recent Labs    11/10/16 1309 11/10/16 2236 11/11/16 0725  WBC 7.0 7.9 7.1  NEUTROABS 4.8  --   --   HGB 12.2 12.2 13.4  HCT 34.6* 36.0 39.5  MCV 102.1* 102.3* 102.1*  PLT 204 206 227   Lipid Panel: Recent Labs    11/11/16 1846  CHOL 156  HDL 61  LDLCALC 87  TRIG 42  CHOLHDL 2.6   TSH: Recent Labs    11/10/16 1310 03/06/17 1009  TSH 5.808* 1.80   A1C: Lab Results  Component Value Date   HGBA1C 5.2 11/11/2016     Assessment/Plan 1. Infected abrasion of right elbow, initial encounter -to clean daily with mild soap and  water - doxycycline (VIBRA-TABS) 100 MG tablet; Take 1 tablet (100 mg total) by mouth 2 (two) times daily.  Dispense:  20 tablet; Refill: 0 -to take with food and to use probiotic to avoid GI upset  2. Severe episode of recurrent major depressive disorder, without psychotic features ([place]) Daughter would like to consider changing Effexor due to increase in agitation and worsening mood.  However with acute infection of elbow will wait until follow up to make changes on medication.   Next appt: 2 week to follow up elbow and mood. Return precautions discussed if redness, swelling or pain worsens.   Carlos American. [place], [place] [facility] [phone]

## 2017-06-29 ENCOUNTER — Emergency Department (HOSPITAL_COMMUNITY): Payer: Medicare HMO

## 2017-06-29 ENCOUNTER — Inpatient Hospital Stay (HOSPITAL_COMMUNITY)
Admission: EM | Admit: 2017-06-29 | Discharge: 2017-07-01 | DRG: 378 | Disposition: A | Discharge status: Home Health | Payer: Medicare HMO | Attending: Internal Medicine | Admitting: Internal Medicine

## 2017-06-29 ENCOUNTER — Encounter (HOSPITAL_COMMUNITY): Payer: Self-pay

## 2017-06-29 ENCOUNTER — Other Ambulatory Visit: Payer: Self-pay

## 2017-06-29 DIAGNOSIS — F03918 Unspecified dementia, unspecified severity, with other behavioral disturbance: Secondary | ICD-10-CM | POA: Diagnosis present

## 2017-06-29 DIAGNOSIS — R4020 Unspecified coma: Secondary | ICD-10-CM | POA: Diagnosis not present

## 2017-06-29 DIAGNOSIS — K625 Hemorrhage of anus and rectum: Secondary | ICD-10-CM | POA: Diagnosis not present

## 2017-06-29 DIAGNOSIS — E039 Hypothyroidism, unspecified: Secondary | ICD-10-CM | POA: Diagnosis present

## 2017-06-29 DIAGNOSIS — Z823 Family history of stroke: Secondary | ICD-10-CM | POA: Diagnosis not present

## 2017-06-29 DIAGNOSIS — Z79890 Hormone replacement therapy (postmenopausal): Secondary | ICD-10-CM | POA: Diagnosis not present

## 2017-06-29 DIAGNOSIS — R402431 Glasgow coma scale score 3-8, in the field [EMT or ambulance]: Secondary | ICD-10-CM | POA: Diagnosis not present

## 2017-06-29 DIAGNOSIS — Z87442 Personal history of urinary calculi: Secondary | ICD-10-CM

## 2017-06-29 DIAGNOSIS — K529 Noninfective gastroenteritis and colitis, unspecified: Secondary | ICD-10-CM | POA: Diagnosis present

## 2017-06-29 DIAGNOSIS — G309 Alzheimer's disease, unspecified: Secondary | ICD-10-CM | POA: Diagnosis present

## 2017-06-29 DIAGNOSIS — Z66 Do not resuscitate: Secondary | ICD-10-CM | POA: Diagnosis present

## 2017-06-29 DIAGNOSIS — I739 Peripheral vascular disease, unspecified: Secondary | ICD-10-CM | POA: Diagnosis present

## 2017-06-29 DIAGNOSIS — I10 Essential (primary) hypertension: Secondary | ICD-10-CM | POA: Diagnosis present

## 2017-06-29 DIAGNOSIS — K921 Melena: Secondary | ICD-10-CM | POA: Diagnosis not present

## 2017-06-29 DIAGNOSIS — F0391 Unspecified dementia with behavioral disturbance: Secondary | ICD-10-CM | POA: Diagnosis present

## 2017-06-29 DIAGNOSIS — M1990 Unspecified osteoarthritis, unspecified site: Secondary | ICD-10-CM | POA: Diagnosis not present

## 2017-06-29 DIAGNOSIS — N281 Cyst of kidney, acquired: Secondary | ICD-10-CM | POA: Diagnosis not present

## 2017-06-29 DIAGNOSIS — K922 Gastrointestinal hemorrhage, unspecified: Secondary | ICD-10-CM

## 2017-06-29 DIAGNOSIS — E7800 Pure hypercholesterolemia, unspecified: Secondary | ICD-10-CM | POA: Diagnosis present

## 2017-06-29 DIAGNOSIS — Z809 Family history of malignant neoplasm, unspecified: Secondary | ICD-10-CM | POA: Diagnosis not present

## 2017-06-29 DIAGNOSIS — J9811 Atelectasis: Secondary | ICD-10-CM | POA: Diagnosis not present

## 2017-06-29 DIAGNOSIS — R4182 Altered mental status, unspecified: Secondary | ICD-10-CM

## 2017-06-29 DIAGNOSIS — E559 Vitamin D deficiency, unspecified: Secondary | ICD-10-CM | POA: Diagnosis present

## 2017-06-29 DIAGNOSIS — K219 Gastro-esophageal reflux disease without esophagitis: Secondary | ICD-10-CM | POA: Diagnosis not present

## 2017-06-29 DIAGNOSIS — G4700 Insomnia, unspecified: Secondary | ICD-10-CM | POA: Diagnosis not present

## 2017-06-29 DIAGNOSIS — G609 Hereditary and idiopathic neuropathy, unspecified: Secondary | ICD-10-CM | POA: Diagnosis present

## 2017-06-29 DIAGNOSIS — F0281 Dementia in other diseases classified elsewhere with behavioral disturbance: Secondary | ICD-10-CM | POA: Diagnosis not present

## 2017-06-29 DIAGNOSIS — Z811 Family history of alcohol abuse and dependence: Secondary | ICD-10-CM

## 2017-06-29 LAB — COMPREHENSIVE METABOLIC PANEL
ALT: 12 U/L — ABNORMAL LOW (ref 14–54)
AST: 27 U/L (ref 15–41)
Albumin: 3.7 g/dL (ref 3.5–5.0)
Alkaline Phosphatase: 102 U/L (ref 38–126)
Anion gap: 13 (ref 5–15)
BUN: 28 mg/dL — ABNORMAL HIGH (ref 6–20)
CO2: 22 mmol/L (ref 22–32)
Calcium: 9.7 mg/dL (ref 8.9–10.3)
Chloride: 104 mmol/L (ref 101–111)
Creatinine, Ser: 1.38 mg/dL — ABNORMAL HIGH (ref 0.44–1.00)
GFR calc Af Amer: 39 mL/min — ABNORMAL LOW (ref >60)
GFR calc non Af Amer: 34 mL/min — ABNORMAL LOW (ref >60)
Glucose, Bld: 153 mg/dL — ABNORMAL HIGH (ref 65–99)
Potassium: 3.4 mmol/L — ABNORMAL LOW (ref 3.5–5.1)
Sodium: 139 mmol/L (ref 135–145)
Total Bilirubin: 0.5 mg/dL (ref 0.3–1.2)
Total Protein: 6.9 g/dL (ref 6.5–8.1)

## 2017-06-29 LAB — URINALYSIS, ROUTINE W REFLEX MICROSCOPIC
Bilirubin Urine: NEGATIVE
Glucose, UA: NEGATIVE mg/dL
Hgb urine dipstick: NEGATIVE
Ketones, ur: NEGATIVE mg/dL
Leukocytes, UA: NEGATIVE
Nitrite: NEGATIVE
Protein, ur: NEGATIVE mg/dL
Specific Gravity, Urine: 1.02 (ref 1.005–1.030)
pH: 5 (ref 5.0–8.0)

## 2017-06-29 LAB — CBC WITH DIFFERENTIAL/PLATELET
Basophils Absolute: 0 10*3/uL (ref 0.0–0.1)
Basophils Relative: 0 %
Eosinophils Absolute: 0.1 10*3/uL (ref 0.0–0.7)
Eosinophils Relative: 1 %
HCT: 42.7 % (ref 36.0–46.0)
Hemoglobin: 14.2 g/dL (ref 12.0–15.0)
Lymphocytes Relative: 26 %
Lymphs Abs: 2.7 10*3/uL (ref 0.7–4.0)
MCH: 33.8 pg (ref 26.0–34.0)
MCHC: 33.3 g/dL (ref 30.0–36.0)
MCV: 101.7 fL — ABNORMAL HIGH (ref 78.0–100.0)
Monocytes Absolute: 0.6 10*3/uL (ref 0.1–1.0)
Monocytes Relative: 5 %
Neutro Abs: 7.2 10*3/uL (ref 1.7–7.7)
Neutrophils Relative %: 68 %
Platelets: 208 10*3/uL (ref 150–400)
RBC: 4.2 MIL/uL (ref 3.87–5.11)
RDW: 13.1 % (ref 11.5–15.5)
WBC: 10.6 10*3/uL — ABNORMAL HIGH (ref 4.0–10.5)

## 2017-06-29 LAB — CBG MONITORING, ED: Glucose-Capillary: 164 mg/dL — ABNORMAL HIGH (ref 65–99)

## 2017-06-29 MED ORDER — IOPAMIDOL (ISOVUE-300) INJECTION 61%: 80.0000 mL | Freq: Once | INTRAVENOUS | Status: DC | PRN

## 2017-06-29 MED ORDER — IOHEXOL 300 MG/ML  SOLN
100.0000 mL | Freq: Once | INTRAMUSCULAR | Status: AC | PRN
  Administered 2017-06-29: 80 mL via INTRAVENOUS

## 2017-06-29 NOTE — ED Triage Notes (Signed)
Patient is from home where family states she went to the restroom and they found her 15 minutes later slumped over on the toilet with bright red vomit around her.  Belly is distended. Alert to painful stimuli.

## 2017-06-29 NOTE — ED Provider Notes (Signed)
[facility] EMERGENCY DEPARTMENT Provider Note   CSN: [id number] Arrival date & time: 06/29/17  1922     History   Chief Complaint Chief Complaint  Patient presents with  . Altered Mental Status    HPI [name] is a 84 y.o. female.  HPI   Patient is an 84 year old female who comes in today brought by EMS after an episode of unresponsiveness at home.  Per EMS, patient told family that she needed to use the bathroom as she had been constipated recently.  After approximately 10 minutes family checked on the patient and found her unresponsive with red-colored emesis on the front of her shirt as well as on the floor.  Per family volume was approximately 1 to 2 cups.  Family state they are unsure as whether or not it was blood as patient had just had some meatloaf with catch-up.  Family also state there was a large bowel movement that "filled up the bowl."  Family denies any recent illness but states the patient had complained of some abdominal pain.   Past Medical History:  Diagnosis Date  . Abnormality of gait   . Alzheimer's disease   . Calculus of kidney   . Closed fracture of seventh cervical vertebra without mention of spinal cord injury   . Depressive disorder, not elsewhere classified   . Edema   . Edema   . External hemorrhoids without mention of complication   . Gastric ulcer, unspecified as acute or chronic, without mention of hemorrhage, perforation, or obstruction   . GERD (gastroesophageal reflux disease)   . Hypertension   . Insomnia, unspecified   . Loss of weight   . Osteoarthrosis, unspecified whether generalized or localized, unspecified site   . Other protein-calorie malnutrition   . Pain in thoracic spine   . Personal history of fall   . Unspecified constipation   . Unspecified hereditary and idiopathic peripheral neuropathy   . Unspecified hypothyroidism   . Unspecified vitamin D deficiency   . Urinary frequency     Patient  Active Problem List   Diagnosis Date Noted  . Protein-calorie malnutrition, severe 11/13/2016  . Multiple falls   . Encephalopathy   . Agitation   . Slurred speech 11/10/2016  . Vitamin D deficiency 03/07/2015  . Hx of fracture of vertebral column 12/28/2014  . Constipation 07/27/2013  . Fracture of vertebra 07/27/2013  . Loss of weight 07/13/2013  . Peripheral vascular disease, unspecified ([place]) 07/13/2013  . Epigastric mass 07/13/2013  . Dementia with behavioral disturbance   . Major depression ([place])   . Insomnia, unspecified   . Fall 09/07/2012  . Fracture of right distal radius 09/07/2012  . Hypothyroidism 08/18/2006  . HYPERCHOLESTEROLEMIA 08/18/2006  . Essential hypertension 08/18/2006  . Restless legs 08/18/2006    Past Surgical History:  Procedure Laterality Date  . TEE WITHOUT CARDIOVERSION N/A 11/13/2016   Procedure: TRANSESOPHAGEAL ECHOCARDIOGRAM (TEE);  Surgeon: Jerline Pain, MD;  Location: [facility] ENDOSCOPY;  Service: Cardiovascular;  Laterality: N/A;  . WRIST SURGERY Right 2013     OB History   None      Home Medications    Prior to Admission medications   Medication Sig Start Date End Date Taking? Authorizing Provider  amitriptyline (ELAVIL) 50 MG tablet Take 1 tablet (50 mg total) by mouth at bedtime. 03/06/17  Yes Reed, Tiffany L, DO  amLODipine (NORVASC) 5 MG tablet Take 1 tablet (5 mg total) by mouth daily. 04/27/17  Yes  [name], NP  levothyroxine (SYNTHROID, LEVOTHROID) 88 MCG tablet TAKE ONE TABLET  ONCE DAILY 30 MINUTES BEFORE BREAKFAST FOR THYROID 05/05/17  Yes Eubanks, Carlos American, NP  NAMZARIC 28-10 MG CP24 TAKE 1 CAPSULE AT BEDTIME. TO STABILIZE MEMORY 05/05/17  Yes [name], NP  senna-docusate (SENNA S) 8.6-50 MG tablet Take 1 tablet by mouth at bedtime. 12/10/16  Yes [name], NP  venlafaxine XR (EFFEXOR-XR) 75 MG 24 hr capsule TAKE 1 CAPSULE  EVERY DAY WITH BREAKFAST 05/05/17  Yes [name], NP  Vitamin D,  Ergocalciferol, (DRISDOL) 50000 units CAPS capsule Take one capsule by mouth every 14 days 12/10/16  Yes [name], NP  doxycycline (VIBRA-TABS) 100 MG tablet Take 1 tablet (100 mg total) by mouth 2 (two) times daily. Patient not taking: Reported on 06/29/2017 06/16/17   [name], NP  feeding supplement, ENSURE ENLIVE, (ENSURE ENLIVE) LIQD Take 237 mLs by mouth 2 (two) times daily between meals. Patient not taking: Reported on 06/29/2017 11/14/16   Mendel Corning, MD    Family History Family History  Problem Relation Age of Onset  . Cancer Mother   . CVA Father   . Cancer Brother   . Alcohol abuse Sister     Social History Social History   Tobacco Use  . Smoking status: Never Smoker  . Smokeless tobacco: Never Used  Substance Use Topics  . Alcohol use: No  . Drug use: No     Allergies   Patient has no known allergies.   Review of Systems Review of Systems  Unable to perform ROS: Mental status change     Physical Exam Updated Vital Signs BP (!) 155/83   Pulse 82   Temp 98.9 F (37.2 C) (Rectal)   Resp (!) 9   Ht 5\' 4"  (1.626 m)   Wt 65.3 kg (144 lb)   SpO2 97%   BMI 24.72 kg/m   Physical Exam  Constitutional: She appears well-developed and well-nourished. No distress.  HENT:  Head: Normocephalic and atraumatic.  Eyes: Pupils are equal, round, and reactive to light. Conjunctivae are normal.  Neck: Neck supple.  Cardiovascular: Normal rate and regular rhythm.  No murmur heard. Pulmonary/Chest: Effort normal and breath sounds normal. No respiratory distress.  Abdominal: Soft. There is no tenderness.  Musculoskeletal: She exhibits no edema.  Neurological:  Somnolent but arousable  Skin: Skin is warm and dry.  Psychiatric:  Somnolent but arousable  Nursing note and vitals reviewed.    ED Treatments / Results  Labs (all labs ordered are listed, but only abnormal results are displayed) Labs Reviewed  CBC WITH DIFFERENTIAL/PLATELET -  Abnormal; Notable for the following components:      Result Value   WBC 10.6 (*)    MCV 101.7 (*)    All other components within normal limits  COMPREHENSIVE METABOLIC PANEL - Abnormal; Notable for the following components:   Potassium 3.4 (*)    Glucose, Bld 153 (*)    BUN 28 (*)    Creatinine, Ser 1.38 (*)    ALT 12 (*)    GFR calc non Af Amer 34 (*)    GFR calc Af Amer 39 (*)    All other components within normal limits  CBG MONITORING, ED - Abnormal; Notable for the following components:   Glucose-Capillary 164 (*)    All other components within normal limits  URINALYSIS, ROUTINE W REFLEX MICROSCOPIC    EKG EKG Interpretation  Date/Time:  Monday Jun 29 2017 19:23:05 EDT Ventricular Rate:  64 PR Interval:    QRS Duration: 111 QT Interval:  480 QTC Calculation: 496 R Axis:   53 Text Interpretation:  Sinus rhythm Ventricular premature complex Abnormal inferior Q waves Borderline prolonged QT interval Confirmed by Davonna Belling [phone]) on 06/29/2017 7:58:34 PM   Radiology Ct Head Wo Contrast  Result Date: 06/29/2017 CLINICAL DATA:  Altered level of consciousness. EXAM: CT HEAD WITHOUT CONTRAST TECHNIQUE: Contiguous axial images were obtained from the base of the skull through the vertex without intravenous contrast. COMPARISON:  CT scan of November 16, 2016. FINDINGS: Brain: Mild diffuse cortical atrophy is noted. Mild chronic ischemic white matter disease is noted. No mass effect or midline shift is noted. Ventricular size is within normal limits. There is no evidence of hemorrhage or acute infarction. Stable calcified meningioma is seen in left posterior fossa. Vascular: No hyperdense vessel or unexpected calcification. Skull: Normal. Negative for fracture or focal lesion. Sinuses/Orbits: No acute finding. Other: None. IMPRESSION: Mild diffuse cortical atrophy. Mild chronic ischemic white matter disease. No acute intracranial abnormality seen. Electronically Signed   By:  Marijo Conception, M.D.   On: 06/29/2017 21:39   Ct Abdomen Pelvis W Contrast  Result Date: 06/29/2017 CLINICAL DATA:  Acute onset of hematemesis and generalized abdominal distention. EXAM: CT ABDOMEN AND PELVIS WITH CONTRAST TECHNIQUE: Multidetector CT imaging of the abdomen and pelvis was performed using the standard protocol following bolus administration of intravenous contrast. CONTRAST:  53mL OMNIPAQUE IOHEXOL 300 MG/ML  SOLN COMPARISON:  CT of the abdomen and pelvis from 07/20/2013 FINDINGS: Lower chest: Mild bibasilar atelectasis or scarring is noted. The visualized portions of the mediastinum are unremarkable. Hepatobiliary: The liver is unremarkable in appearance. The gallbladder is unremarkable in appearance. The common bile duct remains normal in caliber. Pancreas: The pancreas is within normal limits. Spleen: The spleen is unremarkable in appearance. Adrenals/Urinary Tract: The adrenal glands are unremarkable in appearance. A tiny left renal cyst is noted. Mild nonspecific perinephric stranding is noted bilaterally. There is no evidence of hydronephrosis. No renal or ureteral stones are identified. Stomach/Bowel: The stomach is decompressed and unremarkable in appearance. The small bowel is within normal limits. The appendix is normal in caliber, without evidence of appendicitis. There is question of mild wall thickening along the descending colon, which could reflect a mild infectious or inflammatory process. Vascular/Lymphatic: Scattered calcification is seen along the abdominal aorta and its branches. The abdominal aorta is otherwise grossly unremarkable. The inferior vena cava is grossly unremarkable. No retroperitoneal lymphadenopathy is seen. No pelvic sidewall lymphadenopathy is identified. Reproductive: The bladder is mildly distended and grossly unremarkable. The uterus is unremarkable in appearance. The ovaries are relatively symmetric. No suspicious adnexal masses are seen. Other: No  additional soft tissue abnormalities are seen. Musculoskeletal: No acute osseous abnormalities are identified. There is chronic compression deformity of vertebral body L1. The visualized musculature is unremarkable in appearance. IMPRESSION: 1. Question of mild wall thickening along the descending colon, which could reflect a mild infectious or inflammatory process. 2. Mild bibasilar atelectasis or scarring noted. 3. Tiny left renal cyst noted. Aortic Atherosclerosis (ICD10-I70.0). Electronically Signed   By: Garald Balding M.D.   On: 06/29/2017 23:47   Dg Chest Portable 1 View  Result Date: 06/29/2017 CLINICAL DATA:  Abdominal pain bright red vomit unresponsive EXAM: PORTABLE CHEST 1 VIEW COMPARISON:  11/10/2016 FINDINGS: Streaky atelectasis at the bases. No pleural effusion. Mild cardiomegaly with aortic atherosclerosis. Apical pleural thickening as  before. No pneumothorax. Coarse chronic appearing interstitial opacities. IMPRESSION: 1. Streaky bibasilar atelectasis. 2. Mild cardiomegaly Electronically Signed   By: Donavan Foil M.D.   On: 06/29/2017 20:35    Procedures Procedures (including critical care time)  Medications Ordered in ED Medications  iopamidol (ISOVUE-300) 61 % injection 80 mL (has no administration in time range)  iohexol (OMNIPAQUE) 300 MG/ML solution 100 mL (80 mLs Intravenous Contrast Given 06/29/17 2325)     Initial Impression / Assessment and Plan / ED Course  I have reviewed the triage vital signs and the nursing notes.  Pertinent labs & imaging results that were available during my care of the patient were reviewed by me and considered in my medical decision making (see chart for details).     On initial evaluation, patient is somnolent but arousable and states that she does not wish to talk because she "do not have anything to say."  Pupils are 2 mm and reactive bilaterally.  She noted to be moving all 4 extremities.  Laboratory work-up largely within normal  limits.  Patient became more alert once family arrived but stated that she still did not have anything to say.  Head CT shows no acute intracranial abnormality.  However, patient now complains of periumbilical pain.  Patient has had 2 soft bowel movements since being in the emergency department.  Will collect CT abdomen for further evaluation of patient's abdominal pain.  CT of the abdomen showed mild colonic wall thickening, however patient's exam is nonfocal, patient afebrile and is now alert.  Patient is demented at baseline and believe patient to currently be at said baseline.  Patient able to ambulate without difficulty or assistance.  Daughter called for transport back to home. Pt's family given appropriate f/u and return precautions. Pt's family voiced understanding and is agreeable to discharge at this time.    Final Clinical Impressions(s) / ED Diagnoses   Final diagnoses:  Altered mental status, unspecified altered mental status type    ED Discharge Orders    None       Chapman Moss, MD 06/30/17 Lajuana Carry, MD 07/01/17 [phone]

## 2017-06-30 ENCOUNTER — Encounter (HOSPITAL_COMMUNITY): Payer: Self-pay | Admitting: Internal Medicine

## 2017-06-30 ENCOUNTER — Other Ambulatory Visit: Payer: Self-pay

## 2017-06-30 DIAGNOSIS — K529 Noninfective gastroenteritis and colitis, unspecified: Secondary | ICD-10-CM | POA: Diagnosis present

## 2017-06-30 DIAGNOSIS — G4700 Insomnia, unspecified: Secondary | ICD-10-CM | POA: Diagnosis present

## 2017-06-30 DIAGNOSIS — I10 Essential (primary) hypertension: Secondary | ICD-10-CM | POA: Diagnosis present

## 2017-06-30 DIAGNOSIS — E7800 Pure hypercholesterolemia, unspecified: Secondary | ICD-10-CM | POA: Diagnosis present

## 2017-06-30 DIAGNOSIS — K625 Hemorrhage of anus and rectum: Secondary | ICD-10-CM | POA: Diagnosis present

## 2017-06-30 DIAGNOSIS — G609 Hereditary and idiopathic neuropathy, unspecified: Secondary | ICD-10-CM | POA: Diagnosis present

## 2017-06-30 DIAGNOSIS — Z811 Family history of alcohol abuse and dependence: Secondary | ICD-10-CM | POA: Diagnosis not present

## 2017-06-30 DIAGNOSIS — K921 Melena: Secondary | ICD-10-CM | POA: Diagnosis present

## 2017-06-30 DIAGNOSIS — M1990 Unspecified osteoarthritis, unspecified site: Secondary | ICD-10-CM | POA: Diagnosis present

## 2017-06-30 DIAGNOSIS — G309 Alzheimer's disease, unspecified: Secondary | ICD-10-CM | POA: Diagnosis present

## 2017-06-30 DIAGNOSIS — I739 Peripheral vascular disease, unspecified: Secondary | ICD-10-CM | POA: Diagnosis present

## 2017-06-30 DIAGNOSIS — Z823 Family history of stroke: Secondary | ICD-10-CM | POA: Diagnosis not present

## 2017-06-30 DIAGNOSIS — Z66 Do not resuscitate: Secondary | ICD-10-CM | POA: Diagnosis present

## 2017-06-30 DIAGNOSIS — Z79890 Hormone replacement therapy (postmenopausal): Secondary | ICD-10-CM | POA: Diagnosis not present

## 2017-06-30 DIAGNOSIS — E039 Hypothyroidism, unspecified: Secondary | ICD-10-CM | POA: Diagnosis present

## 2017-06-30 DIAGNOSIS — Z87442 Personal history of urinary calculi: Secondary | ICD-10-CM | POA: Diagnosis not present

## 2017-06-30 DIAGNOSIS — K219 Gastro-esophageal reflux disease without esophagitis: Secondary | ICD-10-CM | POA: Diagnosis present

## 2017-06-30 DIAGNOSIS — F0281 Dementia in other diseases classified elsewhere with behavioral disturbance: Secondary | ICD-10-CM | POA: Diagnosis present

## 2017-06-30 DIAGNOSIS — Z809 Family history of malignant neoplasm, unspecified: Secondary | ICD-10-CM | POA: Diagnosis not present

## 2017-06-30 DIAGNOSIS — E559 Vitamin D deficiency, unspecified: Secondary | ICD-10-CM | POA: Diagnosis present

## 2017-06-30 LAB — HEMOGLOBIN AND HEMATOCRIT, BLOOD
HCT: 44.6 % (ref 36.0–46.0)
Hemoglobin: 15.1 g/dL — ABNORMAL HIGH (ref 12.0–15.0)

## 2017-06-30 LAB — TYPE AND SCREEN
ABO/RH(D): O NEG
Antibody Screen: NEGATIVE

## 2017-06-30 LAB — CBC
HCT: 40.2 % (ref 36.0–46.0)
Hemoglobin: 13.7 g/dL (ref 12.0–15.0)
MCH: 33.7 pg (ref 26.0–34.0)
MCHC: 34.1 g/dL (ref 30.0–36.0)
MCV: 98.8 fL (ref 78.0–100.0)
Platelets: 229 10*3/uL (ref 150–400)
RBC: 4.07 MIL/uL (ref 3.87–5.11)
RDW: 12.5 % (ref 11.5–15.5)
WBC: 11.5 10*3/uL — ABNORMAL HIGH (ref 4.0–10.5)

## 2017-06-30 LAB — ABO/RH: ABO/RH(D): O NEG

## 2017-06-30 MED ORDER — ONDANSETRON HCL 4 MG/2ML IJ SOLN: 4.0000 mg | Freq: Four times a day (QID) | INTRAMUSCULAR | Status: DC | PRN

## 2017-06-30 MED ORDER — ACETAMINOPHEN 650 MG RE SUPP: 650.0000 mg | Freq: Four times a day (QID) | RECTAL | Status: DC | PRN

## 2017-06-30 MED ORDER — SODIUM CHLORIDE 0.9 % IV BOLUS
500.0000 mL | Freq: Once | INTRAVENOUS | Status: AC
  Administered 2017-06-30: 500 mL via INTRAVENOUS

## 2017-06-30 MED ORDER — AMLODIPINE BESYLATE 5 MG PO TABS
5.0000 mg | ORAL_TABLET | Freq: Every day | ORAL | Status: DC
  Administered 2017-06-30 – 2017-07-01 (×2): 5 mg via ORAL
  Filled 2017-06-30 (×2): qty 1

## 2017-06-30 MED ORDER — METRONIDAZOLE IN NACL 5-0.79 MG/ML-% IV SOLN
500.0000 mg | Freq: Once | INTRAVENOUS | Status: AC
  Administered 2017-06-30: 500 mg via INTRAVENOUS
  Filled 2017-06-30: qty 100

## 2017-06-30 MED ORDER — CIPROFLOXACIN IN D5W 400 MG/200ML IV SOLN
400.0000 mg | Freq: Once | INTRAVENOUS | Status: AC
  Administered 2017-06-30: 400 mg via INTRAVENOUS
  Filled 2017-06-30: qty 200

## 2017-06-30 MED ORDER — AMITRIPTYLINE HCL 50 MG PO TABS
50.0000 mg | ORAL_TABLET | Freq: Every day | ORAL | Status: DC
  Administered 2017-06-30: 50 mg via ORAL
  Filled 2017-06-30: qty 1

## 2017-06-30 MED ORDER — METRONIDAZOLE IN NACL 5-0.79 MG/ML-% IV SOLN
500.0000 mg | Freq: Three times a day (TID) | INTRAVENOUS | Status: DC
  Administered 2017-06-30 – 2017-07-01 (×3): 500 mg via INTRAVENOUS
  Filled 2017-06-30 (×3): qty 100

## 2017-06-30 MED ORDER — SODIUM CHLORIDE 0.9 % IV SOLN
INTRAVENOUS | Status: AC
  Administered 2017-06-30: 22:00:00 via INTRAVENOUS
  Administered 2017-06-30: 75 mL/h via INTRAVENOUS

## 2017-06-30 MED ORDER — CIPROFLOXACIN IN D5W 400 MG/200ML IV SOLN
400.0000 mg | INTRAVENOUS | Status: DC
  Administered 2017-07-01: 400 mg via INTRAVENOUS
  Filled 2017-06-30: qty 200

## 2017-06-30 MED ORDER — LEVOTHYROXINE SODIUM 88 MCG PO TABS
88.0000 ug | ORAL_TABLET | Freq: Every day | ORAL | Status: DC
  Administered 2017-07-01: 88 ug via ORAL
  Filled 2017-06-30: qty 1

## 2017-06-30 MED ORDER — ACETAMINOPHEN 325 MG PO TABS: 650.0000 mg | ORAL_TABLET | Freq: Four times a day (QID) | ORAL | Status: DC | PRN

## 2017-06-30 MED ORDER — VENLAFAXINE HCL ER 75 MG PO CP24
75.0000 mg | ORAL_CAPSULE | Freq: Every day | ORAL | Status: DC
  Administered 2017-07-01: 75 mg via ORAL
  Filled 2017-06-30: qty 1

## 2017-06-30 MED ORDER — ONDANSETRON HCL 4 MG PO TABS: 4.0000 mg | ORAL_TABLET | Freq: Four times a day (QID) | ORAL | Status: DC | PRN

## 2017-06-30 NOTE — ED Notes (Signed)
Spoke with patient daughter [name] (number in chart) concerning disposition to be discharged.  Daughter stated that no family can get patient until 8am.  Attempted to call family at the house where patient lives, no answer.

## 2017-06-30 NOTE — ED Notes (Signed)
Pts daughter [name] contacted via the phone, per pts daughter she is confused at baseline, pts daughter will be visiting within the next 30 mins and is aware that the pt is to adhere to  A clear liquid diet, pt updated

## 2017-06-30 NOTE — H&P (Signed)
History and Physical    [name] VFI:[id number] DOB: [birth year] DOA: 06/29/2017  PCP: Gabrielle Chandler, NP Patient coming from: home  Chief Complaint: BRBPR/unresponsiveness  HPI: [name] is a cantankerous  84 y.o. female with medical history significant for dementia, hypertension,peripheral vascular disease, GERD, hemorrhoids, presents to the emergency department with chief complaint of unresponsiveness and bright red blood per rectum. Initial evaluationresulted in planning discharge and she went to the bathroom in the emergency department and had a bright red bloody bowel movement. [facility] were asked to admit.  Information is obtained from the chart and the patient noting that information from patient may be unreliable secondary to dementia. He presented to the emergency department yesterday from home with family reporting period of unresponsiveness. They states she went to the bathroom and 15 minutes later they found her slumped over with red colored emesis on the front of her shirt and the floor. It's unclear this was blood as reportedly patient had meatloaf with ketchup. [name] reported a large bloody bowel movement that "filled up the bowll".  Patient was lethargic so family called EMS. Chart review indicates patient was alert upon arrival to the emergency department.No recent illnesses. Reports of any recent shortness of breath complaints of chest pain palpitation dizziness syncope or near-syncope. Patient has complained of constipation lately. No complaints of dysuria hematuria frequency or urgency.    ED Course: n the emergency department she's afebrile hemodynamically stable not hypoxic.CT of the head showed no acute intracranial abnormality. Vitals were within the limits of normal. CT of the abdomen showed mild colonic wall thickening. uring the night she had another episode of bright red blood per rectum. She was started on Cipro and Flagyl.  Review of Systems: As  per HPI otherwise all other systems reviewed and are negative.   Ambulatory Status: ambulates independently and is independent with ADLs  Past Medical History:  Diagnosis Date  . Abnormality of gait   . Alzheimer's disease   . Calculus of kidney   . Closed fracture of seventh cervical vertebra without mention of spinal cord injury   . Depressive disorder, not elsewhere classified   . Edema   . Edema   . External hemorrhoids without mention of complication   . Gastric ulcer, unspecified as acute or chronic, without mention of hemorrhage, perforation, or obstruction   . GERD (gastroesophageal reflux disease)   . Hypertension   . Insomnia, unspecified   . Loss of weight   . Osteoarthrosis, unspecified whether generalized or localized, unspecified site   . Other protein-calorie malnutrition   . Pain in thoracic spine   . Personal history of fall   . Unspecified constipation   . Unspecified hereditary and idiopathic peripheral neuropathy   . Unspecified hypothyroidism   . Unspecified vitamin D deficiency   . Urinary frequency     Past Surgical History:  Procedure Laterality Date  . TEE WITHOUT CARDIOVERSION N/A 11/13/2016   Procedure: TRANSESOPHAGEAL ECHOCARDIOGRAM (TEE);  Surgeon: Jerline Pain, MD;  Location: [facility] ENDOSCOPY;  Service: Cardiovascular;  Laterality: N/A;  . WRIST SURGERY Right 2013    Social History   Socioeconomic History  . Marital status: Widowed    Spouse name: Not on file  . Number of children: Not on file  . Years of education: Not on file  . Highest education level: Not on file  Occupational History  . Not on file  Social Needs  . Financial resource strain: Not hard at all  .  Food insecurity:    Worry: Never true    Inability: Never true  . Transportation needs:    Medical: No    Non-medical: No  Tobacco Use  . Smoking status: Never Smoker  . Smokeless tobacco: Never Used  Substance and Sexual Activity  . Alcohol use: No  . Drug use: No  .  Sexual activity: Never  Lifestyle  . Physical activity:    Days per week: 3 days    Minutes per session: 20 min  . Stress: Only a little  Relationships  . Social connections:    Talks on phone: More than three times a week    Gets together: More than three times a week    Attends religious service: Never    Active member of club or organization: No    Attends meetings of clubs or organizations: Never    Relationship status: Widowed  . Intimate partner violence:    Fear of current or ex partner: No    Emotionally abused: No    Physically abused: No    Forced sexual activity: No  Other Topics Concern  . Not on file  Social History Narrative  . Not on file    No Known Allergies  Family History  Problem Relation Age of Onset  . Cancer Mother   . CVA Father   . Cancer Brother   . Alcohol abuse Sister     Prior to Admission medications   Medication Sig Start Date End Date Taking? Authorizing Provider  amitriptyline (ELAVIL) 50 MG tablet Take 1 tablet (50 mg total) by mouth at bedtime. 03/06/17  Yes Reed, Tiffany L, DO  amLODipine (NORVASC) 5 MG tablet Take 1 tablet (5 mg total) by mouth daily. 04/27/17  Yes Gabrielle Chandler, NP  levothyroxine (SYNTHROID, LEVOTHROID) 88 MCG tablet TAKE ONE TABLET  ONCE DAILY 30 MINUTES BEFORE BREAKFAST FOR THYROID 05/05/17  Yes Eubanks, Carlos American, NP  NAMZARIC 28-10 MG CP24 TAKE 1 CAPSULE AT BEDTIME. TO STABILIZE MEMORY 05/05/17  Yes Gabrielle Chandler, NP  senna-docusate (SENNA S) 8.6-50 MG tablet Take 1 tablet by mouth at bedtime. 12/10/16  Yes Gabrielle Chandler, NP  venlafaxine XR (EFFEXOR-XR) 75 MG 24 hr capsule TAKE 1 CAPSULE  EVERY DAY WITH BREAKFAST 05/05/17  Yes Gabrielle Chandler, NP  Vitamin D, Ergocalciferol, (DRISDOL) 50000 units CAPS capsule Take one capsule by mouth every 14 days 12/10/16  Yes Gabrielle Chandler, NP    Physical Exam: Vitals:   06/30/17 0000 06/30/17 0846 06/30/17 0847 06/30/17 0900  BP: (!) 166/93 (!) 157/88  (!)  154/91  Pulse: 93 76 74 79  Resp: 17  11 11   Temp:      TempSrc:      SpO2: 95% 98% 100% 96%  Weight:      Height:         General:  Appears calm and comfortable slightly pale somewhat frail Eyes:  PERRL, EOMI, normal lids, iris ENT:  grossly normal hearing, lips & tongue, mucous membranes of her mouth are moist and pink Neck:  no LAD, masses or thyromegaly Cardiovascular:  RRR, no m/r/g. No LE edema.  Respiratory:  CTA bilaterally, no w/r/r. Normal respiratory effort. Abdomen:  soft, ntnd, positive bowel sounds no guarding or rebounding Skin:  no rash or induration seen on limited exam Musculoskeletal:  grossly normal tone BUE/BLE, good ROM, no bony abnormality Psychiatric:  Slightly agitated/irritable but cooperative with exam. Neurologic:  Oriented to self and place. Able to  follow simple commands. Speech clear facial symmetry moving all extremities spontaneously bilateral grip 5 out of 5  Labs on Admission: I have personally reviewed following labs and imaging studies  CBC: Recent Labs  Lab 06/29/17 2000 06/30/17 0354  WBC 10.6*  --   NEUTROABS 7.2  --   HGB 14.2 15.1*  HCT 42.7 44.6  MCV 101.7*  --   PLT 208  --    Basic Metabolic Panel: Recent Labs  Lab 06/29/17 2000  NA 139  K 3.4*  CL 104  CO2 22  GLUCOSE 153*  BUN 28*  CREATININE 1.38*  CALCIUM 9.7   GFR: Estimated Creatinine Clearance: 26.2 mL/min (A) (by C-G formula based on SCr of 1.38 mg/dL (H)). Liver Function Tests: Recent Labs  Lab 06/29/17 2000  AST 27  ALT 12*  ALKPHOS 102  BILITOT 0.5  PROT 6.9  ALBUMIN 3.7   No results for input(s): LIPASE, AMYLASE in the last 168 hours. No results for input(s): AMMONIA in the last 168 hours. Coagulation Profile: No results for input(s): INR, PROTIME in the last 168 hours. Cardiac Enzymes: No results for input(s): CKTOTAL, CKMB, CKMBINDEX, TROPONINI in the last 168 hours. BNP (last 3 results) No results for input(s): PROBNP in the last 8760  hours. HbA1C: No results for input(s): HGBA1C in the last 72 hours. CBG: Recent Labs  Lab 06/29/17 1924  GLUCAP 164*   Lipid Profile: No results for input(s): CHOL, HDL, LDLCALC, TRIG, CHOLHDL, LDLDIRECT in the last 72 hours. Thyroid Function Tests: No results for input(s): TSH, T4TOTAL, FREET4, T3FREE, THYROIDAB in the last 72 hours. Anemia Panel: No results for input(s): VITAMINB12, FOLATE, FERRITIN, TIBC, IRON, RETICCTPCT in the last 72 hours. Urine analysis:    Component Value Date/Time   COLORURINE YELLOW 06/29/2017 1947   APPEARANCEUR CLEAR 06/29/2017 1947   LABSPEC 1.020 06/29/2017 1947   PHURINE 5.0 06/29/2017 1947   GLUCOSEU NEGATIVE 06/29/2017 [place] NEGATIVE 06/29/2017 [place] 06/29/2017 1947   BILIRUBINUR neg 04/27/2017 [place] 06/29/2017 1947   PROTEINUR NEGATIVE 06/29/2017 1947   UROBILINOGEN 0.2 04/27/2017 1612   UROBILINOGEN 0.2 12/19/2014 2002   NITRITE NEGATIVE 06/29/2017 1947   LEUKOCYTESUR NEGATIVE 06/29/2017 1947    Creatinine Clearance: Estimated Creatinine Clearance: 26.2 mL/min (A) (by C-G formula based on SCr of 1.38 mg/dL (H)).  Sepsis Labs: @LABRCNTIP (procalcitonin:4,lacticidven:4) )No results found for this or any previous visit (from the past 240 hour(s)).   Radiological Exams on Admission: Ct Head Wo Contrast  Result Date: 06/29/2017 CLINICAL DATA:  Altered level of consciousness. EXAM: CT HEAD WITHOUT CONTRAST TECHNIQUE: Contiguous axial images were obtained from the base of the skull through the vertex without intravenous contrast. COMPARISON:  CT scan of November 16, 2016. FINDINGS: Brain: Mild diffuse cortical atrophy is noted. Mild chronic ischemic white matter disease is noted. No mass effect or midline shift is noted. Ventricular size is within normal limits. There is no evidence of hemorrhage or acute infarction. Stable calcified meningioma is seen in left posterior fossa. Vascular: No  hyperdense vessel or unexpected calcification. Skull: Normal. Negative for fracture or focal lesion. Sinuses/Orbits: No acute finding. Other: None. IMPRESSION: Mild diffuse cortical atrophy. Mild chronic ischemic white matter disease. No acute intracranial abnormality seen. Electronically Signed   By: Marijo Conception, M.D.   On: 06/29/2017 21:39   Ct Abdomen Pelvis W Contrast  Result Date: 06/29/2017 CLINICAL DATA:  Acute onset of hematemesis and generalized abdominal distention. EXAM:  CT ABDOMEN AND PELVIS WITH CONTRAST TECHNIQUE: Multidetector CT imaging of the abdomen and pelvis was performed using the standard protocol following bolus administration of intravenous contrast. CONTRAST:  88mL OMNIPAQUE IOHEXOL 300 MG/ML  SOLN COMPARISON:  CT of the abdomen and pelvis from 07/20/2013 FINDINGS: Lower chest: Mild bibasilar atelectasis or scarring is noted. The visualized portions of the mediastinum are unremarkable. Hepatobiliary: The liver is unremarkable in appearance. The gallbladder is unremarkable in appearance. The common bile duct remains normal in caliber. Pancreas: The pancreas is within normal limits. Spleen: The spleen is unremarkable in appearance. Adrenals/Urinary Tract: The adrenal glands are unremarkable in appearance. A tiny left renal cyst is noted. Mild nonspecific perinephric stranding is noted bilaterally. There is no evidence of hydronephrosis. No renal or ureteral stones are identified. Stomach/Bowel: The stomach is decompressed and unremarkable in appearance. The small bowel is within normal limits. The appendix is normal in caliber, without evidence of appendicitis. There is question of mild wall thickening along the descending colon, which could reflect a mild infectious or inflammatory process. Vascular/Lymphatic: Scattered calcification is seen along the abdominal aorta and its branches. The abdominal aorta is otherwise grossly unremarkable. The inferior vena cava is grossly  unremarkable. No retroperitoneal lymphadenopathy is seen. No pelvic sidewall lymphadenopathy is identified. Reproductive: The bladder is mildly distended and grossly unremarkable. The uterus is unremarkable in appearance. The ovaries are relatively symmetric. No suspicious adnexal masses are seen. Other: No additional soft tissue abnormalities are seen. Musculoskeletal: No acute osseous abnormalities are identified. There is chronic compression deformity of vertebral body L1. The visualized musculature is unremarkable in appearance. IMPRESSION: 1. Question of mild wall thickening along the descending colon, which could reflect a mild infectious or inflammatory process. 2. Mild bibasilar atelectasis or scarring noted. 3. Tiny left renal cyst noted. Aortic Atherosclerosis (ICD10-I70.0). Electronically Signed   By: Garald Balding M.D.   On: 06/29/2017 23:47   Dg Chest Portable 1 View  Result Date: 06/29/2017 CLINICAL DATA:  Abdominal pain bright red vomit unresponsive EXAM: PORTABLE CHEST 1 VIEW COMPARISON:  11/10/2016 FINDINGS: Streaky atelectasis at the bases. No pleural effusion. Mild cardiomegaly with aortic atherosclerosis. Apical pleural thickening as before. No pneumothorax. Coarse chronic appearing interstitial opacities. IMPRESSION: 1. Streaky bibasilar atelectasis. 2. Mild cardiomegaly Electronically Signed   By: Donavan Foil M.D.   On: 06/29/2017 20:35    EKG: Independently reviewed. Sinus rhythm Ventricular premature complex Abnormal inferior Q waves Borderline prolonged QT interval  Assessment/Plan Principal Problem:   BRBPR (bright red blood per rectum) Active Problems:   Hypothyroidism   Essential hypertension   Dementia with behavioral disturbance   Colitis   #1. Bright red blood per rectum. One episode in the emergency department and one episode prior to arrival. She does have a history of internal/external hemorrhoids. CT questions mild wall thickening along the descending colon  concerning for mild infectious or inflammatory process. She is afebrile nontoxic appearing only mild leukocytosis. Hemoglobin stable at 15. She is provided with Cipro and Flagyl in the emergency department -Admit -Continue Cipro and Flagyl -Clear liquids -Serial CBCs -iv fluids -Monitor  #2. Colitis. CT of the abdomen is noted above. Patient is afebrile with only a mild leukocytosis. She is nontoxic appearing. Abdominal exam benign. She is provided with Cipro and Flagyl. -Continue Cipro and Flagyl -Clear liquids -If no further episodes of BRBPR/no fever/no leukocytosis consider stopping antibiotics 24 hours  #3. Hypertension.blood pressure high end of normal in the emergency department.home medications include Norvasc -Continue Norvasc -Monitor  #  4. Hypothyroidism -Continue Synthroid  #5. Dementia. Appears to be stable at baseline -monitor  DVT prophylaxis: scd  Code Status: dnr  Family Communication: none  Disposition Plan: home 24 hours  Consults called: none  Admission status: inpatient    Radene Gunning MD [facility]  If 7PM-7AM, please contact night-coverage [email or web address] Password [facility]  06/30/2017, 9:29 AM

## 2017-06-30 NOTE — ED Notes (Signed)
[name], daughter: [phone] [name], daughter: to return around 1530 on 5/15

## 2017-06-30 NOTE — ED Provider Notes (Signed)
Patient was awaiting discharge.  Discharge by prior provider in resident.  See their full note for details.  In brief she presented with episode of bright red emesis per family.  Question altered mental status.  Work-up was largely reassuring.  She was awaiting discharge.  I was informed by nursing that she had a bright red bloody bowel movement.  They report that it filled the toilet.  I have ordered a repeat hemoglobin.  Repeat hemoglobin is stable; however, does not likely reflect any acute bleeding.  I have reviewed her work-up.  She did have a CT scan that showed some thickening of the colon.  Given bleeding, will treat for colitis.  Given her age and comorbidities, feel she would benefit from admission for fluids, monitoring, and antibiotics.  Patient was given Cipro and Flagyl.  She is hemodynamically stable.  Type and screen was ordered.   Merryl Hacker, MD 06/30/17 [phone]

## 2017-06-30 NOTE — ED Notes (Signed)
Small amt of red blood found in adult brief, pt incontinent of stool medium amt of soft stool

## 2017-06-30 NOTE — ED Notes (Signed)
Patient ambulated with assistance to BR. Patient steady gait and tolerated well.

## 2017-06-30 NOTE — ED Notes (Signed)
Patient is confused thought she was lost re-oriented patient . Assisted patient to bathroom, bright red rectal bleeding denies pain. Patient was cleaned and placed back in bed. Dr. Dina Rich aware orders received.

## 2017-06-30 NOTE — ED Notes (Signed)
Pt's liquid diet dinner tray arrived.

## 2017-06-30 NOTE — ED Notes (Signed)
Clear Liquid lunch diet tray ordered.  

## 2017-07-01 DIAGNOSIS — K625 Hemorrhage of anus and rectum: Secondary | ICD-10-CM

## 2017-07-01 LAB — CBC
HCT: 41.7 % (ref 36.0–46.0)
Hemoglobin: 14 g/dL (ref 12.0–15.0)
MCH: 33.4 pg (ref 26.0–34.0)
MCHC: 33.6 g/dL (ref 30.0–36.0)
MCV: 99.5 fL (ref 78.0–100.0)
Platelets: 217 10*3/uL (ref 150–400)
RBC: 4.19 MIL/uL (ref 3.87–5.11)
RDW: 12.5 % (ref 11.5–15.5)
WBC: 14.7 10*3/uL — ABNORMAL HIGH (ref 4.0–10.5)

## 2017-07-01 LAB — BASIC METABOLIC PANEL
Anion gap: 9 (ref 5–15)
BUN: 10 mg/dL (ref 6–20)
CO2: 27 mmol/L (ref 22–32)
Calcium: 8.7 mg/dL — ABNORMAL LOW (ref 8.9–10.3)
Chloride: 103 mmol/L (ref 101–111)
Creatinine, Ser: 1.05 mg/dL — ABNORMAL HIGH (ref 0.44–1.00)
GFR calc Af Amer: 55 mL/min — ABNORMAL LOW (ref >60)
GFR calc non Af Amer: 47 mL/min — ABNORMAL LOW (ref >60)
Glucose, Bld: 112 mg/dL — ABNORMAL HIGH (ref 65–99)
Potassium: 3.7 mmol/L (ref 3.5–5.1)
Sodium: 139 mmol/L (ref 135–145)

## 2017-07-01 MED ORDER — METRONIDAZOLE 250 MG PO TABS: 250.0000 mg | ORAL_TABLET | Freq: Three times a day (TID) | ORAL | 0 refills | Status: DC

## 2017-07-01 MED ORDER — CIPROFLOXACIN HCL 500 MG PO TABS: 500.0000 mg | ORAL_TABLET | Freq: Two times a day (BID) | ORAL | 0 refills | Status: DC

## 2017-07-01 NOTE — Discharge Summary (Signed)
Physician Discharge Summary  MARTENA EMANUELE TIW:[id number] DOB: July 28, 1931 DOA: 06/29/2017  PCP: [name], NP  Admit date: 06/29/2017 Discharge date: 07/01/2017  Admitted From: Home Disposition:  Home with Home health (Patient and the daughter opts for this rather than SNF)  Recommendations for Outpatient Follow-up:  1. Follow up with PCP in 1 weeks 2. Please obtain BMP/CBC in one week your next doctors visit.  3. Take Cipro and Flagyl Orally for 7 days    Home Health: PT/OT/RN/Aide  Equipment/Devices: None  Discharge Condition: Stable CODE STATUS: DNR Diet recommendation: Regular   Brief/Interim Summary: 84 year old female with history of dementia, essential hypertension, peripheral vascular disease, GERD, hemorrhoids came to the hospital for evaluation of bright red blood per rectum.  Currently in the ER patient initially did well and was getting ready to be discharged but right before that had an episode of large bloody bowel movement which filled up the bowl therefore was admitted for further evaluation.  While being in the hospital her hemoglobin has remained stable, her vital signs are remained stable and no further bleeding noted at this time.  Patient does have history of hemorrhoids but does not necessarily remember when her last colonoscopy was. At this time patient does not want any aggressive procedural intervention.  CT of the abdomen pelvis was questionable for mild descending colonic wall thickening therefore patient was started on Cipro, she will complete a 7-day course.  Due to her weakness she was evaluated by physical therapy who recommended patient will benefit from home health PT after an extensive conversation by them with the patient's daughter.  Patient's daughter would like her to come back to the home environment.  At this point patient has reached maximum benefit from an hospital stay and stable to be discharged with outpatient follow-up recommendations as  stated above.   Discharge Diagnoses:  Principal Problem:   BRBPR (bright red blood per rectum) Active Problems:   Hypothyroidism   Essential hypertension   Dementia with behavioral disturbance   Colitis  Bright red blood per rectum, resolved Mild descending colon colitis - Patient is no longer having any bleeding.  Her hemoglobin rest the vital signs remained stable.  CT of the abdomen pelvis was suggestive of descending colon colitis therefore started on Cipro and Flagyl.  Generalized weakness and deconditioning  -Evaluated by physical therapy and after extensive discussion by the physical therapist with the patient's daughter, determination was made for patient to go home with home PT.  Home health arrangements will be made with the help of case manager.  Essential hypertension -Continue home medications  Hypo thyroidism -Continue Synthroid  Dementia -Patient's mental status is at her baseline.  Patient is a DNR SCDs for DVT prophylaxis  Discharge Instructions   Allergies as of 07/01/2017   No Known Allergies     Medication List    TAKE these medications   amitriptyline 50 MG tablet Commonly known as:  ELAVIL Take 1 tablet (50 mg total) by mouth at bedtime.   amLODipine 5 MG tablet Commonly known as:  NORVASC Take 1 tablet (5 mg total) by mouth daily.   ciprofloxacin 500 MG tablet Commonly known as:  CIPRO Take 1 tablet (500 mg total) by mouth 2 (two) times daily for 7 days.   levothyroxine 88 MCG tablet Commonly known as:  SYNTHROID, LEVOTHROID TAKE ONE TABLET  ONCE DAILY 30 MINUTES BEFORE BREAKFAST FOR THYROID   metroNIDAZOLE 250 MG tablet Commonly known as:  FLAGYL Take 1 tablet (  250 mg total) by mouth 3 (three) times daily for 7 days.   NAMZARIC 28-10 MG Cp24 Generic drug:  Memantine HCl-Donepezil HCl TAKE 1 CAPSULE AT BEDTIME. TO STABILIZE MEMORY   senna-docusate 8.6-50 MG tablet Commonly known as:  SENNA S Take 1 tablet by mouth at bedtime.    venlafaxine XR 75 MG 24 hr capsule Commonly known as:  EFFEXOR-XR TAKE 1 CAPSULE  EVERY DAY WITH BREAKFAST   Vitamin D (Ergocalciferol) 50000 units Caps capsule Commonly known as:  DRISDOL Take one capsule by mouth every 14 days      Follow-up Information    [name], NP. Schedule an appointment as soon as possible for a visit in 1 week(s).   Specialty:  Geriatric Medicine Contact information: [place]. [place] [address] [phone]        [place].   Specialty:  Emergency Medicine Why:  If symptoms worsen Contact information: [address] [id number] [place] [phone]         No Known Allergies  You were cared for by a hospitalist during your hospital stay. If you have any questions about your discharge medications or the care you received while you were in the hospital after you are discharged, you can call the unit and asked to speak with the hospitalist on call if the hospitalist that took care of you is not available. Once you are discharged, your primary care physician will handle any further medical issues. Please note that no refills for any discharge medications will be authorized once you are discharged, as it is imperative that you return to your primary care physician (or establish a relationship with a primary care physician if you do not have one) for your aftercare needs so that they can reassess your need for medications and monitor your lab values.  Consultations:  None   Procedures/Studies: Ct Head Wo Contrast  Result Date: 06/29/2017 CLINICAL DATA:  Altered level of consciousness. EXAM: CT HEAD WITHOUT CONTRAST TECHNIQUE: Contiguous axial images were obtained from the base of the skull through the vertex without intravenous contrast. COMPARISON:  CT scan of November 16, 2016. FINDINGS: Brain: Mild diffuse cortical atrophy is noted. Mild chronic  ischemic white matter disease is noted. No mass effect or midline shift is noted. Ventricular size is within normal limits. There is no evidence of hemorrhage or acute infarction. Stable calcified meningioma is seen in left posterior fossa. Vascular: No hyperdense vessel or unexpected calcification. Skull: Normal. Negative for fracture or focal lesion. Sinuses/Orbits: No acute finding. Other: None. IMPRESSION: Mild diffuse cortical atrophy. Mild chronic ischemic white matter disease. No acute intracranial abnormality seen. Electronically Signed   By: Marijo Conception, M.D.   On: 06/29/2017 21:39   Ct Abdomen Pelvis W Contrast  Result Date: 06/29/2017 CLINICAL DATA:  Acute onset of hematemesis and generalized abdominal distention. EXAM: CT ABDOMEN AND PELVIS WITH CONTRAST TECHNIQUE: Multidetector CT imaging of the abdomen and pelvis was performed using the standard protocol following bolus administration of intravenous contrast. CONTRAST:  52mL OMNIPAQUE IOHEXOL 300 MG/ML  SOLN COMPARISON:  CT of the abdomen and pelvis from 07/20/2013 FINDINGS: Lower chest: Mild bibasilar atelectasis or scarring is noted. The visualized portions of the mediastinum are unremarkable. Hepatobiliary: The liver is unremarkable in appearance. The gallbladder is unremarkable in appearance. The common bile duct remains normal in caliber. Pancreas: The pancreas is within normal limits. Spleen: The spleen is unremarkable in appearance. Adrenals/Urinary Tract:  The adrenal glands are unremarkable in appearance. A tiny left renal cyst is noted. Mild nonspecific perinephric stranding is noted bilaterally. There is no evidence of hydronephrosis. No renal or ureteral stones are identified. Stomach/Bowel: The stomach is decompressed and unremarkable in appearance. The small bowel is within normal limits. The appendix is normal in caliber, without evidence of appendicitis. There is question of mild wall thickening along the descending colon, which  could reflect a mild infectious or inflammatory process. Vascular/Lymphatic: Scattered calcification is seen along the abdominal aorta and its branches. The abdominal aorta is otherwise grossly unremarkable. The inferior vena cava is grossly unremarkable. No retroperitoneal lymphadenopathy is seen. No pelvic sidewall lymphadenopathy is identified. Reproductive: The bladder is mildly distended and grossly unremarkable. The uterus is unremarkable in appearance. The ovaries are relatively symmetric. No suspicious adnexal masses are seen. Other: No additional soft tissue abnormalities are seen. Musculoskeletal: No acute osseous abnormalities are identified. There is chronic compression deformity of vertebral body L1. The visualized musculature is unremarkable in appearance. IMPRESSION: 1. Question of mild wall thickening along the descending colon, which could reflect a mild infectious or inflammatory process. 2. Mild bibasilar atelectasis or scarring noted. 3. Tiny left renal cyst noted. Aortic Atherosclerosis (ICD10-I70.0). Electronically Signed   By: Garald Balding M.D.   On: 06/29/2017 23:47   Dg Chest Portable 1 View  Result Date: 06/29/2017 CLINICAL DATA:  Abdominal pain bright red vomit unresponsive EXAM: PORTABLE CHEST 1 VIEW COMPARISON:  11/10/2016 FINDINGS: Streaky atelectasis at the bases. No pleural effusion. Mild cardiomegaly with aortic atherosclerosis. Apical pleural thickening as before. No pneumothorax. Coarse chronic appearing interstitial opacities. IMPRESSION: 1. Streaky bibasilar atelectasis. 2. Mild cardiomegaly Electronically Signed   By: Donavan Foil M.D.   On: 06/29/2017 20:35      Subjective: No complaints this morning.  HEENT/EYES = negative for pain, redness, loss of vision, double vision, blurred vision, loss of hearing, sore throat, hoarseness, dysphagia Cardiovascular= negative for chest pain, palpitation, murmurs, lower extremity swelling Respiratory/lungs= negative for  shortness of breath, cough, hemoptysis, wheezing, mucus production Gastrointestinal= negative for nausea, vomiting,, abdominal pain, melena, hematemesis Genitourinary= negative for Dysuria, Hematuria, Change in Urinary Frequency MSK = Negative for arthralgia, myalgias, Back Pain, Joint swelling  Neurology= Negative for headache, seizures, numbness, tingling  Psychiatry= Negative for anxiety, depression, suicidal and homocidal ideation Allergy/Immunology= Medication/Food allergy as listed  Skin= Negative for Rash, lesions, ulcers, itching   Discharge Exam: Vitals:   07/01/17 0801 07/01/17 1014  BP: 135/82 (!) 144/93  Pulse: 83   Resp: 16   Temp: 97.7 F (36.5 C)   SpO2: 96%    Vitals:   06/30/17 2317 06/30/17 2349 07/01/17 0801 07/01/17 1014  BP:  (!) 142/97 135/82 (!) 144/93  Pulse:  93 83   Resp:  18 16   Temp:  97.8 F (36.6 C) 97.7 F (36.5 C)   TempSrc:   Oral   SpO2:  95% 96%   Weight:      Height: 5\' 8"  (1.727 m)       General: Pt is alert, awake, not in acute distress, elderly frail-appearing Cardiovascular: RRR, S1/S2 +, no rubs, no gallops Respiratory: CTA bilaterally, no wheezing, no rhonchi Abdominal: Soft, NT, ND, bowel sounds + Extremities: no edema, no cyanosis    The results of significant diagnostics from this hospitalization (including imaging, microbiology, ancillary and laboratory) are listed below for reference.     Microbiology: No results found for this or any previous visit (from the past  240 hour(s)).   Labs: BNP (last 3 results) No results for input(s): BNP in the last 8760 hours. Basic Metabolic Panel: Recent Labs  Lab 06/29/17 2000 07/01/17 0251  NA 139 139  K 3.4* 3.7  CL 104 103  CO2 22 27  GLUCOSE 153* 112*  BUN 28* 10  CREATININE 1.38* 1.05*  CALCIUM 9.7 8.7*   Liver Function Tests: Recent Labs  Lab 06/29/17 2000  AST 27  ALT 12*  ALKPHOS 102  BILITOT 0.5  PROT 6.9  ALBUMIN 3.7   No results for input(s):  LIPASE, AMYLASE in the last 168 hours. No results for input(s): AMMONIA in the last 168 hours. CBC: Recent Labs  Lab 06/29/17 2000 06/30/17 0354 06/30/17 1855 07/01/17 0251  WBC 10.6*  --  11.5* 14.7*  NEUTROABS 7.2  --   --   --   HGB 14.2 15.1* 13.7 14.0  HCT 42.7 44.6 40.2 41.7  MCV 101.7*  --  98.8 99.5  PLT 208  --  229 217   Cardiac Enzymes: No results for input(s): CKTOTAL, CKMB, CKMBINDEX, TROPONINI in the last 168 hours. BNP: Invalid input(s): POCBNP CBG: Recent Labs  Lab 06/29/17 1924  GLUCAP 164*   D-Dimer No results for input(s): DDIMER in the last 72 hours. Hgb A1c No results for input(s): HGBA1C in the last 72 hours. Lipid Profile No results for input(s): CHOL, HDL, LDLCALC, TRIG, CHOLHDL, LDLDIRECT in the last 72 hours. Thyroid function studies No results for input(s): TSH, T4TOTAL, T3FREE, THYROIDAB in the last 72 hours.  Invalid input(s): FREET3 Anemia work up No results for input(s): VITAMINB12, FOLATE, FERRITIN, TIBC, IRON, RETICCTPCT in the last 72 hours. Urinalysis    Component Value Date/Time   COLORURINE YELLOW 06/29/2017 1947   APPEARANCEUR CLEAR 06/29/2017 1947   LABSPEC 1.020 06/29/2017 1947   PHURINE 5.0 06/29/2017 [place] 06/29/2017 [place] NEGATIVE 06/29/2017 [place] NEGATIVE 06/29/2017 1947   BILIRUBINUR neg 04/27/2017 [place] 06/29/2017 1947   PROTEINUR NEGATIVE 06/29/2017 1947   UROBILINOGEN 0.2 04/27/2017 1612   UROBILINOGEN 0.2 12/19/2014 2002   NITRITE NEGATIVE 06/29/2017 1947   LEUKOCYTESUR NEGATIVE 06/29/2017 1947   Sepsis Labs Invalid input(s): PROCALCITONIN,  WBC,  LACTICIDVEN Microbiology No results found for this or any previous visit (from the past 240 hour(s)).   Time coordinating discharge:  I have spent 35 minutes face to face with the patient and on the ward discussing the patients care, assessment, plan and disposition with other care givers. >50% of the  time was devoted counseling the patient about the risks and benefits of treatment/Discharge disposition and coordinating care.   SIGNED:   Damita Lack, MD  [facility] 07/01/2017, 1:34 PM Pager   If 7PM-7AM, please contact night-coverage [email or web address] Password [email or web address]

## 2017-07-01 NOTE — Care Management Note (Signed)
Case Management Note  Patient Details  Name: [name] MRN: [id number] Date of Birth: December 27, 1931  Subjective/Objective:   Patient from home with daughters , presents with unresponsiveness and GIB,  Per pt eval rec HHPT,  NCM spoke with daughter , [name] for [facility] services she chose [facility] for [place], [place], [place], [place].  She states she also has a private aide from 10:30 am to 12:30 am and her other sister is there with her mother all the time for support.  Referral given to Butch Penny with [facility] services.  Soc will begin 24-48 hrs post dc.                 Action/Plan: DC Home with [place] services.  Expected Discharge Date:  07/01/17               Expected Discharge Plan:  [place]  In-House Referral:     Discharge planning Services  CM Consult  Post Acute Care Choice:  Home Health Choice offered to:  Adult Children  DME Arranged:    DME Agency:     HH Arranged:  RN, Disease Management, PT, OT, Nurse's Aide [place] Agency:  [facility]  Status of Service:  Completed, signed off  If discussed at [place] of Stay Meetings, dates discussed:    Additional Comments:  Zenon Mayo, RN 07/01/2017, 2:13 PM

## 2017-07-01 NOTE — Evaluation (Signed)
Physical Therapy Evaluation Patient Details Name: [name] MRN: [id number] DOB: [birth year] Today's Date: 07/01/2017   History of Present Illness  84 y.o. female with medical history significant for dementia, hypertension,peripheral vascular disease, GERD, hemorrhoids, presents to the emergency department with chief complaint of unresponsiveness and bright red blood per rectum. Initial evaluationresulted in planning discharge and she went to the bathroom in the emergency department and had a bright red bloody bowel movement CT of the abdomen showed mild colonic wall thickening. uring the night she had another episode of bright red blood per rectum.  Clinical Impression  Pt living arrangements and prior level of function obtained from daughter [name] via telephone. Daughter lives with her however is w/c bound and unable to provide physical assist. PTA pt able to mobilize in her familiar environment with use of furniture walking, has DME however does not use. Pt able to dress and pt's other daughter provides help with bathing. Pt has aide who come in morning cooks breakfast and does minor cleaning and laundry. Pt's family provides food for dinner. Pt is limited today in her safe mobility by decreased strength and endurance as well as decreased safety awareness. Pt currently requires minA for bed mobility, transfers and ambulation of 9 feet to and from bathroom with RW. After extensive communication with daughter PT recommends HHPT level rehab for improving strength, balance and endurance at d/c given pt's familiarity with home environment and 24 hour supervision of daughter. PT will continue to follow acutely.      Follow Up Recommendations Home health PT;Supervision/Assistance - 24 hour    Equipment Recommendations  None recommended by PT    Recommendations for Other Services       Precautions / Restrictions Precautions Precautions: Fall Restrictions Weight Bearing Restrictions: No       Mobility  Bed Mobility Overal bed mobility: Needs Assistance Bed Mobility: Supine to Sit     Supine to sit: Min assist     General bed mobility comments: slow, deliberate movement to EoB however ultimatedly required minA for pad scoot to EoB  Transfers Overall transfer level: Needs assistance Equipment used: Rolling walker (2 wheeled) Transfers: Sit to/from Stand Sit to Stand: Min assist         General transfer comment: minA for power up and steadying from bed and from toilet  Ambulation/Gait Ambulation/Gait assistance: Min assist Ambulation Distance (Feet): 18 Feet Assistive device: Rolling walker (2 wheeled) Gait Pattern/deviations: Step-through pattern;Decreased step length - right;Decreased step length - left;Shuffle;Trunk flexed Gait velocity: slowed Gait velocity interpretation: <1.31 ft/sec, indicative of household ambulator General Gait Details: minA for steadying with RW, pt unfamiliar with RW usage requiring maximal cuing for proximity to RW and for not letting go of walker in midst of ambulation to reach out for the sink      Balance Overall balance assessment: Needs assistance Sitting-balance support: Feet supported;No upper extremity supported Sitting balance-Leahy Scale: Fair     Standing balance support: No upper extremity supported;During functional activity Standing balance-Leahy Scale: Fair Standing balance comment: able to perform pericare by herself                             Pertinent Vitals/Pain Pain Assessment: No/denies pain    Home Living Family/patient expects to be discharged to:: Private residence Living Arrangements: Children Available Help at Discharge: Family Type of Home: House Home Access: Stairs to enter Entrance Stairs-Rails: Can reach both Entrance Stairs-Number of Steps:  3 Home Layout: One level Home Equipment: Walker - 2 wheels;Bedside commode;Tub bench;Wheelchair - manual Additional Comments: no family  present, called daughter who lives with her however is wc bound herself and unable to provide physical assist    Prior Function Level of Independence: Needs assistance   Gait / Transfers Assistance Needed: ambulates by furniture walking   ADL's / Homemaking Assistance Needed: sitter helps with breakfast, cleaning, and laundry, second daughter helps with bathing            Extremity/Trunk Assessment   Upper Extremity Assessment Upper Extremity Assessment: Generalized weakness    Lower Extremity Assessment Lower Extremity Assessment: Generalized weakness    Cervical / Trunk Assessment Cervical / Trunk Assessment: Kyphotic  Communication   Communication: HOH  Cognition Arousal/Alertness: Awake/alert Behavior During Therapy: WFL for tasks assessed/performed Overall Cognitive Status: History of cognitive impairments - at baseline                                 General Comments: oriented to self and place, decreased awarness of DME usage and safety awareness      General Comments General comments (skin integrity, edema, etc.): VSS        Assessment/Plan    PT Assessment Patient needs continued PT services  PT Problem List Decreased strength;Decreased activity tolerance;Decreased balance;Decreased mobility;Decreased cognition;Decreased knowledge of use of DME;Decreased safety awareness       PT Treatment Interventions Gait training;Stair training;Functional mobility training;DME instruction;Therapeutic activities;Therapeutic exercise;Balance training;Cognitive remediation;Patient/family education    PT Goals (Current goals can be found in the Care Plan section)  Acute Rehab PT Goals Patient Stated Goal: go home PT Goal Formulation: Patient unable to participate in goal setting Time For Goal Achievement: 07/15/17 Potential to Achieve Goals: Fair    Frequency Min 3X/week    AM-PAC PT "6 Clicks" Daily Activity  Outcome Measure Difficulty turning over  in bed (including adjusting bedclothes, sheets and blankets)?: A Little Difficulty moving from lying on back to sitting on the side of the bed? : Unable Difficulty sitting down on and standing up from a chair with arms (e.g., wheelchair, bedside commode, etc,.)?: Unable Help needed moving to and from a bed to chair (including a wheelchair)?: A Little Help needed walking in hospital room?: A Little Help needed climbing 3-5 steps with a railing? : A Lot 6 Click Score: 13    End of Session Equipment Utilized During Treatment: Gait belt Activity Tolerance: Patient tolerated treatment well Patient left: in bed;with call bell/phone within reach;with bed alarm set Nurse Communication: Mobility status PT Visit Diagnosis: Unsteadiness on feet (R26.81);Other abnormalities of gait and mobility (R26.89);Muscle weakness (generalized) (M62.81);Difficulty in walking, not elsewhere classified (R26.2);History of falling (Z91.81)    Time: 7544-9201 PT Time Calculation (min) (ACUTE ONLY): 36 min   Charges:   PT Evaluation $PT Eval Moderate Complexity: 1 Mod PT Treatments $Therapeutic Activity: 8-22 mins   PT G Codes:        Seirra Kos B. Migdalia Dk PT, DPT Acute Rehabilitation  [phone] Pager [phone]    [place] 07/01/2017, 11:20 AM

## 2017-07-01 NOTE — Progress Notes (Signed)
Patient and daughter given discharge instructions and verbalize understanding. Patient's IV removed and stood to get in wheelchair. Patient taken to private vehicle by this RN for d/c home with daughter.

## 2017-07-03 ENCOUNTER — Telehealth: Payer: Self-pay

## 2017-07-03 NOTE — Telephone Encounter (Signed)
Transition Care Management Follow-Up Telephone Call   Date discharged and where: [facility] on 07/01/2017  How have you been since you were released from the hospital? Per patient daughter she has not been eating since she has been home. As far as she knows there has been no signs of blood in her stools since being home. Patient has not started PT yet.  Any patient concerns? None  Items Reviewed:   Meds: Y. Taking Cipro and Flagyl for 7 days  Allergies: Y  Dietary Changes Reviewed: Y  Functional Questionnaire:  Independent-I Dependent-D  ADLs:   Dressing- I    Eating- I   Maintaining continence- Incontinent with urine   Transferring- Has cane, walker and wheelchair but will not use them   Transportation- D   Meal Prep-D   Managing Meds-D  Confirmed importance and Date/Time of follow-up visits scheduled: Yes, Sherrie Mustache NP 07/08/2017 @ 3:15pm   Confirmed with patient if condition worsens to call PCP or go to the Emergency Dept. Patient was given office number and encouraged to call back with questions or concerns: Yes

## 2017-07-07 DIAGNOSIS — K219 Gastro-esophageal reflux disease without esophagitis: Secondary | ICD-10-CM | POA: Diagnosis not present

## 2017-07-07 DIAGNOSIS — G309 Alzheimer's disease, unspecified: Secondary | ICD-10-CM | POA: Diagnosis not present

## 2017-07-07 DIAGNOSIS — E440 Moderate protein-calorie malnutrition: Secondary | ICD-10-CM | POA: Diagnosis not present

## 2017-07-07 DIAGNOSIS — M1990 Unspecified osteoarthritis, unspecified site: Secondary | ICD-10-CM | POA: Diagnosis not present

## 2017-07-07 DIAGNOSIS — I739 Peripheral vascular disease, unspecified: Secondary | ICD-10-CM | POA: Diagnosis not present

## 2017-07-07 DIAGNOSIS — F0280 Dementia in other diseases classified elsewhere without behavioral disturbance: Secondary | ICD-10-CM | POA: Diagnosis not present

## 2017-07-07 DIAGNOSIS — F329 Major depressive disorder, single episode, unspecified: Secondary | ICD-10-CM | POA: Diagnosis not present

## 2017-07-07 DIAGNOSIS — K529 Noninfective gastroenteritis and colitis, unspecified: Secondary | ICD-10-CM | POA: Diagnosis not present

## 2017-07-07 DIAGNOSIS — I10 Essential (primary) hypertension: Secondary | ICD-10-CM | POA: Diagnosis not present

## 2017-07-08 ENCOUNTER — Ambulatory Visit (INDEPENDENT_AMBULATORY_CARE_PROVIDER_SITE_OTHER): Payer: Medicare HMO | Admitting: Nurse Practitioner

## 2017-07-08 ENCOUNTER — Encounter: Payer: Self-pay | Admitting: Nurse Practitioner

## 2017-07-08 ENCOUNTER — Telehealth: Payer: Self-pay | Admitting: *Deleted

## 2017-07-08 VITALS — BP 118/62 | HR 92 | Temp 98.0°F | Ht 64.0 in | Wt 142.0 lb

## 2017-07-08 DIAGNOSIS — R531 Weakness: Secondary | ICD-10-CM | POA: Diagnosis not present

## 2017-07-08 DIAGNOSIS — F0281 Dementia in other diseases classified elsewhere with behavioral disturbance: Secondary | ICD-10-CM | POA: Diagnosis not present

## 2017-07-08 DIAGNOSIS — F0280 Dementia in other diseases classified elsewhere without behavioral disturbance: Secondary | ICD-10-CM | POA: Diagnosis not present

## 2017-07-08 DIAGNOSIS — K921 Melena: Secondary | ICD-10-CM

## 2017-07-08 DIAGNOSIS — G308 Other Alzheimer's disease: Secondary | ICD-10-CM

## 2017-07-08 DIAGNOSIS — K5900 Constipation, unspecified: Secondary | ICD-10-CM

## 2017-07-08 DIAGNOSIS — I739 Peripheral vascular disease, unspecified: Secondary | ICD-10-CM | POA: Diagnosis not present

## 2017-07-08 DIAGNOSIS — K529 Noninfective gastroenteritis and colitis, unspecified: Secondary | ICD-10-CM | POA: Diagnosis not present

## 2017-07-08 DIAGNOSIS — E440 Moderate protein-calorie malnutrition: Secondary | ICD-10-CM | POA: Diagnosis not present

## 2017-07-08 DIAGNOSIS — M1990 Unspecified osteoarthritis, unspecified site: Secondary | ICD-10-CM | POA: Diagnosis not present

## 2017-07-08 DIAGNOSIS — I10 Essential (primary) hypertension: Secondary | ICD-10-CM | POA: Diagnosis not present

## 2017-07-08 DIAGNOSIS — K219 Gastro-esophageal reflux disease without esophagitis: Secondary | ICD-10-CM | POA: Diagnosis not present

## 2017-07-08 DIAGNOSIS — F02818 Dementia in other diseases classified elsewhere, unspecified severity, with other behavioral disturbance: Secondary | ICD-10-CM

## 2017-07-08 DIAGNOSIS — F329 Major depressive disorder, single episode, unspecified: Secondary | ICD-10-CM | POA: Diagnosis not present

## 2017-07-08 DIAGNOSIS — G309 Alzheimer's disease, unspecified: Secondary | ICD-10-CM | POA: Diagnosis not present

## 2017-07-08 NOTE — Patient Instructions (Signed)
To increase stool softener/laxative to three tablets daily

## 2017-07-08 NOTE — Progress Notes (Signed)
Careteam: Patient Care Team: Gabrielle Chandler, NP as PCP - General (Geriatric Medicine)  Advanced Directive information Does Patient Have a Medical Advance Directive?: Yes, Type of Advance Directive: Healthcare Power of Attorney  No Known Allergies  Chief Complaint  Patient presents with  . Transitions Of Care    Pt was recently admitted to [facility] 5/13 to 5/15 due to rectal bleeding and colitis.      HPI: Patient is a 84 y.o. female seen in the office today history of dementia, essential hypertension, peripheral vascular disease, GERD, hemorrhoids came to the hospital for evaluation of bright red blood per rectum.  Daughter reports she has had problems with GI tract for several days. Gagging. Day she was sent to hospital daughter reports she vomited and had AMS while on the toilet so sent her to hospital.  Diagnosised with colitis, completed cipro and flagyl, completed treatment today. Daughter does not feel like she has had a bowel movement since she has been home. Taking Senakot at night- taking 2 tablets this week.   Complaining of abdominal pain most of the time.  Daughter reports she is not eating or eating very minimally.  Lost 2 lbs and surprised that is all she has lost with the amount of food she has eaten.  Daughter started on probiotics 3 days ago.   Home health PT started today Review of Systems:  Review of Systems  Unable to perform ROS: Dementia    Past Medical History:  Diagnosis Date  . Abnormality of gait   . Alzheimer's disease   . Calculus of kidney   . Closed fracture of seventh cervical vertebra without mention of spinal cord injury   . Depressive disorder, not elsewhere classified   . Edema   . Edema   . External hemorrhoids without mention of complication   . Gastric ulcer, unspecified as acute or chronic, without mention of hemorrhage, perforation, or obstruction   . GERD (gastroesophageal reflux disease)   . Hypertension   . Insomnia,  unspecified   . Loss of weight   . Osteoarthrosis, unspecified whether generalized or localized, unspecified site   . Other protein-calorie malnutrition   . Pain in thoracic spine   . Personal history of fall   . Unspecified constipation   . Unspecified hereditary and idiopathic peripheral neuropathy   . Unspecified hypothyroidism   . Unspecified vitamin D deficiency   . Urinary frequency    Past Surgical History:  Procedure Laterality Date  . TEE WITHOUT CARDIOVERSION N/A 11/13/2016   Procedure: TRANSESOPHAGEAL ECHOCARDIOGRAM (TEE);  Surgeon: Jerline Pain, MD;  Location: [facility] ENDOSCOPY;  Service: Cardiovascular;  Laterality: N/A;  . WRIST SURGERY Right 2013   Social History:   reports that she has never smoked. She has never used smokeless tobacco. She reports that she does not drink alcohol or use drugs.  Family History  Problem Relation Age of Onset  . Cancer Mother   . CVA Father   . Cancer Brother   . Alcohol abuse Sister     Medications: Patient's Medications  New Prescriptions   No medications on file  Previous Medications   AMITRIPTYLINE (ELAVIL) 50 MG TABLET    Take 1 tablet (50 mg total) by mouth at bedtime.   AMLODIPINE (NORVASC) 5 MG TABLET    Take 1 tablet (5 mg total) by mouth daily.   LEVOTHYROXINE (SYNTHROID, LEVOTHROID) 88 MCG TABLET    TAKE ONE TABLET  ONCE DAILY 30 MINUTES BEFORE BREAKFAST FOR  THYROID   NAMZARIC 28-10 MG CP24    TAKE 1 CAPSULE AT BEDTIME. TO STABILIZE MEMORY   SENNA-DOCUSATE (SENNA S) 8.6-50 MG TABLET    Take 1 tablet by mouth at bedtime.   VENLAFAXINE XR (EFFEXOR-XR) 75 MG 24 HR CAPSULE    TAKE 1 CAPSULE  EVERY DAY WITH BREAKFAST   VITAMIN D, ERGOCALCIFEROL, (DRISDOL) 50000 UNITS CAPS CAPSULE    Take one capsule by mouth every 14 days  Modified Medications   No medications on file  Discontinued Medications   CIPROFLOXACIN (CIPRO) 500 MG TABLET    Take 1 tablet (500 mg total) by mouth 2 (two) times daily for 7 days.   METRONIDAZOLE  (FLAGYL) 250 MG TABLET    Take 1 tablet (250 mg total) by mouth 3 (three) times daily for 7 days.     Physical Exam:  Vitals:   07/08/17 1514  BP: 118/62  Pulse: 92  Temp: 98 F (36.7 C)  TempSrc: Oral  SpO2: 96%  Weight: 142 lb (64.4 kg)  Height: 5\' 4"  (1.626 m)   Body mass index is 24.37 kg/m.  Physical Exam  Constitutional: No distress.  Thin. Frail. female  HENT:  Head: Normocephalic and atraumatic.  Eyes: Pupils are equal, round, and reactive to light. Conjunctivae and EOM are normal.  Neck: Normal range of motion. Neck supple.  Cardiovascular: Normal rate, regular rhythm and normal heart sounds.  Pulmonary/Chest: Effort normal and breath sounds normal.  Abdominal: Soft. Bowel sounds are normal. She exhibits no distension. There is no tenderness.  Rounded abdomen, soft, nontender  Genitourinary:  Genitourinary Comments: Declines rectal exam  Musculoskeletal: She exhibits edema (trace bilaterally).  Neurological: She is alert. No cranial nerve deficit. Coordination abnormal.  Severe memory loss.  09/12/09 was 25/30.  MMSE 13/30 -- 11/2015  MMSE 18/30-02/2017  Skin: She is not diaphoretic.  Psychiatric:  Poor judgment. Impulsive.    Labs reviewed: Basic Metabolic Panel: Recent Labs    11/10/16 1309 11/10/16 1310  11/11/16 1846  03/06/17 1009 06/29/17 2000 07/01/17 0251  NA 138  --    < > 135   < > 142 139 139  K 3.2*  --    < > 3.1*   < > 3.9 3.4* 3.7  CL 105  --    < > 102   < > 106 104 103  CO2 24  --    < > 26   < > 26 22 27   GLUCOSE 96  --    < > 129*   < > 81 153* 112*  BUN 19  --    < > 10   < > 19 28* 10  CREATININE 0.91  --    < > 1.00   < > 1.10* 1.38* 1.05*  CALCIUM 8.8*  --    < > 9.0   < > 9.3 9.7 8.7*  MG 1.9  --   --  2.0  --   --   --   --   TSH  --  5.808*  --   --   --  1.80  --   --    < > = values in this interval not displayed.   Liver Function Tests: Recent Labs    11/11/16 0725 11/11/16 1846 12/10/16 1619 06/29/17 2000    AST 26 33 14 27  ALT 14 16 9  12*  ALKPHOS 62 56  --  102  BILITOT 1.2 1.1 0.5 0.5  PROT 6.4* 6.7  7.2 6.9  ALBUMIN 3.8 3.8  --  3.7   Recent Labs    11/10/16 1309  LIPASE 28   No results for input(s): AMMONIA in the last 8760 hours. CBC: Recent Labs    11/10/16 1309  06/29/17 2000 06/30/17 0354 06/30/17 1855 07/01/17 0251  WBC 7.0   < > 10.6*  --  11.5* 14.7*  NEUTROABS 4.8  --  7.2  --   --   --   HGB 12.2   < > 14.2 15.1* 13.7 14.0  HCT 34.6*   < > 42.7 44.6 40.2 41.7  MCV 102.1*   < > 101.7*  --  98.8 99.5  PLT 204   < > 208  --  229 217   < > = values in this interval not displayed.   Lipid Panel: Recent Labs    11/11/16 1846  CHOL 156  HDL 61  LDLCALC 87  TRIG 42  CHOLHDL 2.6   TSH: Recent Labs    11/10/16 1310 03/06/17 1009  TSH 5.808* 1.80   A1C: Lab Results  Component Value Date   HGBA1C 5.2 11/11/2016     Assessment/Plan 1. Colitis Completed cipro and flagyl, to continue probiotic at this time. Encouraged proper hydration and nutrition  - CBC with Differential/Platelets - COMPLETE METABOLIC PANEL WITH GFR  2. Alzheimer's disease of other onset with behavioral disturbance Advanced dementia, pt lives at home with daughter but she is chair bound. Decrease oral intake which was exacerbated by colitis. Encouraged increased supervision and to promote proper nutritional intake.   3. Generalized weakness Home health started today for PT/OT.   4. Constipation, unspecified constipation type Will not take miralax due to liquid form, has taken Rx medication in the past which has caused diarrhea. To increase senakot to 3 tablets daily at this time.   5. Bloody stool No recurrent blood, will follow up CBC today  Next appt: 3 months with Dr Mariea Clonts, sooner if needed  Cidra K. [place], [facility] [phone]

## 2017-07-08 NOTE — Telephone Encounter (Signed)
Holly with [facility] called requesting verbal orders for Speech Therapy. Verbal orders given.

## 2017-07-09 ENCOUNTER — Telehealth: Payer: Self-pay

## 2017-07-09 LAB — COMPLETE METABOLIC PANEL WITH GFR
AG Ratio: 1.6 (calc) (ref 1.0–2.5)
ALT: 15 U/L (ref 6–29)
AST: 27 U/L (ref 10–35)
Albumin: 4.1 g/dL (ref 3.6–5.1)
Alkaline phosphatase (APISO): 92 U/L (ref 33–130)
BUN/Creatinine Ratio: 17 (calc) (ref 6–22)
BUN: 19 mg/dL (ref 7–25)
CO2: 31 mmol/L (ref 20–32)
Calcium: 9.1 mg/dL (ref 8.6–10.4)
Chloride: 101 mmol/L (ref 98–110)
Creat: 1.14 mg/dL — ABNORMAL HIGH (ref 0.60–0.88)
GFR, Est African American: 51 mL/min/{1.73_m2} — ABNORMAL LOW (ref >=60)
GFR, Est Non African American: 44 mL/min/{1.73_m2} — ABNORMAL LOW (ref >=60)
Globulin: 2.6 g/dL (calc) (ref 1.9–3.7)
Glucose, Bld: 93 mg/dL (ref 65–139)
Potassium: 3.7 mmol/L (ref 3.5–5.3)
Sodium: 139 mmol/L (ref 135–146)
Total Bilirubin: 0.4 mg/dL (ref 0.2–1.2)
Total Protein: 6.7 g/dL (ref 6.1–8.1)

## 2017-07-09 LAB — CBC WITH DIFFERENTIAL/PLATELET
Basophils Absolute: 69 cells/uL (ref 0–200)
Basophils Relative: 1 %
Eosinophils Absolute: 131 cells/uL (ref 15–500)
Eosinophils Relative: 1.9 %
HCT: 37.7 % (ref 35.0–45.0)
Hemoglobin: 13.2 g/dL (ref 11.7–15.5)
Lymphs Abs: 1849 cells/uL (ref 850–3900)
MCH: 33.8 pg — ABNORMAL HIGH (ref 27.0–33.0)
MCHC: 35 g/dL (ref 32.0–36.0)
MCV: 96.7 fL (ref 80.0–100.0)
MPV: 9.7 fL (ref 7.5–12.5)
Monocytes Relative: 9.5 %
Neutro Abs: 4195 cells/uL (ref 1500–7800)
Neutrophils Relative %: 60.8 %
Platelets: 251 10*3/uL (ref 140–400)
RBC: 3.9 10*6/uL (ref 3.80–5.10)
RDW: 12.1 % (ref 11.0–15.0)
Total Lymphocyte: 26.8 %
WBC mixed population: 656 cells/uL (ref 200–950)
WBC: 6.9 10*3/uL (ref 3.8–10.8)

## 2017-07-09 NOTE — Telephone Encounter (Signed)
I spoke with patient's daughter, [name], and she verbalized understanding. She did not have any questions at this time.

## 2017-07-09 NOTE — Telephone Encounter (Signed)
-----   Message from Man Otho Darner, NP sent at 07/09/2017 12:48 PM EDT ----- Please inform the patient her white count has trended down to 6.9 from 14.7 a week ago. Her compromised renal function remains no significant change, creat 1.14 now and it was 1.05 a week ago. No change of mediations. Thank you

## 2017-07-10 ENCOUNTER — Telehealth: Payer: Self-pay

## 2017-07-10 DIAGNOSIS — E440 Moderate protein-calorie malnutrition: Secondary | ICD-10-CM | POA: Diagnosis not present

## 2017-07-10 DIAGNOSIS — G309 Alzheimer's disease, unspecified: Secondary | ICD-10-CM | POA: Diagnosis not present

## 2017-07-10 DIAGNOSIS — K219 Gastro-esophageal reflux disease without esophagitis: Secondary | ICD-10-CM | POA: Diagnosis not present

## 2017-07-10 DIAGNOSIS — M1990 Unspecified osteoarthritis, unspecified site: Secondary | ICD-10-CM | POA: Diagnosis not present

## 2017-07-10 DIAGNOSIS — K529 Noninfective gastroenteritis and colitis, unspecified: Secondary | ICD-10-CM | POA: Diagnosis not present

## 2017-07-10 DIAGNOSIS — F0280 Dementia in other diseases classified elsewhere without behavioral disturbance: Secondary | ICD-10-CM | POA: Diagnosis not present

## 2017-07-10 DIAGNOSIS — I10 Essential (primary) hypertension: Secondary | ICD-10-CM | POA: Diagnosis not present

## 2017-07-10 DIAGNOSIS — F329 Major depressive disorder, single episode, unspecified: Secondary | ICD-10-CM | POA: Diagnosis not present

## 2017-07-10 DIAGNOSIS — I739 Peripheral vascular disease, unspecified: Secondary | ICD-10-CM | POA: Diagnosis not present

## 2017-07-10 NOTE — Telephone Encounter (Signed)
[name] with speech pathology called to let provider know that patient had a sudden drop in blood pressure during her visit today. [name] stated that while obtaining vitals, patient bp while sitting was 121/81 but when patient stood up, her bp dropped to 85/62 and patient became very unsteady.   Patient has had several falls lately and family is now wondering if the falls could be coming from sudden drops in BP when standing.   Please advise on any recommendations for family.

## 2017-07-10 NOTE — Telephone Encounter (Signed)
Please inform [name] and family of any recommendations  [name] can be reached at [phone]  [name] (daughter) can be reached at [phone]

## 2017-07-13 DIAGNOSIS — K529 Noninfective gastroenteritis and colitis, unspecified: Secondary | ICD-10-CM | POA: Diagnosis not present

## 2017-07-13 DIAGNOSIS — G309 Alzheimer's disease, unspecified: Secondary | ICD-10-CM | POA: Diagnosis not present

## 2017-07-13 DIAGNOSIS — I739 Peripheral vascular disease, unspecified: Secondary | ICD-10-CM | POA: Diagnosis not present

## 2017-07-13 DIAGNOSIS — E440 Moderate protein-calorie malnutrition: Secondary | ICD-10-CM | POA: Diagnosis not present

## 2017-07-13 DIAGNOSIS — K219 Gastro-esophageal reflux disease without esophagitis: Secondary | ICD-10-CM | POA: Diagnosis not present

## 2017-07-13 DIAGNOSIS — F329 Major depressive disorder, single episode, unspecified: Secondary | ICD-10-CM | POA: Diagnosis not present

## 2017-07-13 DIAGNOSIS — M1990 Unspecified osteoarthritis, unspecified site: Secondary | ICD-10-CM | POA: Diagnosis not present

## 2017-07-13 DIAGNOSIS — I10 Essential (primary) hypertension: Secondary | ICD-10-CM | POA: Diagnosis not present

## 2017-07-13 DIAGNOSIS — F0280 Dementia in other diseases classified elsewhere without behavioral disturbance: Secondary | ICD-10-CM | POA: Diagnosis not present

## 2017-07-14 ENCOUNTER — Other Ambulatory Visit: Payer: Self-pay | Admitting: Nurse Practitioner

## 2017-07-14 DIAGNOSIS — E440 Moderate protein-calorie malnutrition: Secondary | ICD-10-CM | POA: Diagnosis not present

## 2017-07-14 DIAGNOSIS — F0280 Dementia in other diseases classified elsewhere without behavioral disturbance: Secondary | ICD-10-CM | POA: Diagnosis not present

## 2017-07-14 DIAGNOSIS — M1990 Unspecified osteoarthritis, unspecified site: Secondary | ICD-10-CM | POA: Diagnosis not present

## 2017-07-14 DIAGNOSIS — K529 Noninfective gastroenteritis and colitis, unspecified: Secondary | ICD-10-CM | POA: Diagnosis not present

## 2017-07-14 DIAGNOSIS — G309 Alzheimer's disease, unspecified: Secondary | ICD-10-CM | POA: Diagnosis not present

## 2017-07-14 DIAGNOSIS — F329 Major depressive disorder, single episode, unspecified: Secondary | ICD-10-CM | POA: Diagnosis not present

## 2017-07-14 DIAGNOSIS — I739 Peripheral vascular disease, unspecified: Secondary | ICD-10-CM | POA: Diagnosis not present

## 2017-07-14 DIAGNOSIS — I10 Essential (primary) hypertension: Secondary | ICD-10-CM | POA: Diagnosis not present

## 2017-07-14 DIAGNOSIS — K219 Gastro-esophageal reflux disease without esophagitis: Secondary | ICD-10-CM | POA: Diagnosis not present

## 2017-07-14 MED ORDER — AMITRIPTYLINE HCL 25 MG PO TABS: 25.0000 mg | ORAL_TABLET | Freq: Every day | ORAL | 0 refills | Status: DC

## 2017-07-14 MED ORDER — AMITRIPTYLINE HCL 10 MG PO TABS: 10.0000 mg | ORAL_TABLET | Freq: Every day | ORAL | 0 refills | Status: DC

## 2017-07-14 NOTE — Telephone Encounter (Signed)
It sounds like she needs 24/7 care. I have discussed this with pt and family in the past. She does live with one of her children but they are chair bound. Agreeable to increase OT, needs nursing as well if she does not have and SW

## 2017-07-14 NOTE — Telephone Encounter (Signed)
Amitriptyline can also cause orthostatic hypotension. Lets reduce this to 25 mg by mouth daily for 1 week then reduce to 10 mg (or if they can half the half tablet) daily for 1 week then stop (we can also send in for 10 mg tablets)

## 2017-07-14 NOTE — Telephone Encounter (Signed)
Trent with [facility] called to let provider know that patient has had several falls over the last week or so. Pt fell over the weekend while leaving bathroom and hit her head on the washer. Pt laid in the floor for about 30 min before crawling to bed and pulling herself up. [name] reports that patient is not eating or drinking and pt is getting dizzy with standing.   [name] also asked for verbal order for OT twice a week for 3 weeks, effective 07/13/17

## 2017-07-14 NOTE — Telephone Encounter (Signed)
I spoke with patient' daughter, [name], and she verbalized understanding. Prescriptions were sent to the pharmacy for the medication adjustments.   I called [facility] to add on nursing and social work.

## 2017-07-16 DIAGNOSIS — F329 Major depressive disorder, single episode, unspecified: Secondary | ICD-10-CM | POA: Diagnosis not present

## 2017-07-16 DIAGNOSIS — K529 Noninfective gastroenteritis and colitis, unspecified: Secondary | ICD-10-CM | POA: Diagnosis not present

## 2017-07-16 DIAGNOSIS — E440 Moderate protein-calorie malnutrition: Secondary | ICD-10-CM | POA: Diagnosis not present

## 2017-07-16 DIAGNOSIS — M1990 Unspecified osteoarthritis, unspecified site: Secondary | ICD-10-CM | POA: Diagnosis not present

## 2017-07-16 DIAGNOSIS — F0280 Dementia in other diseases classified elsewhere without behavioral disturbance: Secondary | ICD-10-CM | POA: Diagnosis not present

## 2017-07-16 DIAGNOSIS — G309 Alzheimer's disease, unspecified: Secondary | ICD-10-CM | POA: Diagnosis not present

## 2017-07-16 DIAGNOSIS — I739 Peripheral vascular disease, unspecified: Secondary | ICD-10-CM | POA: Diagnosis not present

## 2017-07-16 DIAGNOSIS — K219 Gastro-esophageal reflux disease without esophagitis: Secondary | ICD-10-CM | POA: Diagnosis not present

## 2017-07-16 DIAGNOSIS — I10 Essential (primary) hypertension: Secondary | ICD-10-CM | POA: Diagnosis not present

## 2017-07-17 DIAGNOSIS — E440 Moderate protein-calorie malnutrition: Secondary | ICD-10-CM | POA: Diagnosis not present

## 2017-07-17 DIAGNOSIS — F329 Major depressive disorder, single episode, unspecified: Secondary | ICD-10-CM | POA: Diagnosis not present

## 2017-07-17 DIAGNOSIS — I739 Peripheral vascular disease, unspecified: Secondary | ICD-10-CM | POA: Diagnosis not present

## 2017-07-17 DIAGNOSIS — G309 Alzheimer's disease, unspecified: Secondary | ICD-10-CM | POA: Diagnosis not present

## 2017-07-17 DIAGNOSIS — M1990 Unspecified osteoarthritis, unspecified site: Secondary | ICD-10-CM | POA: Diagnosis not present

## 2017-07-17 DIAGNOSIS — K219 Gastro-esophageal reflux disease without esophagitis: Secondary | ICD-10-CM | POA: Diagnosis not present

## 2017-07-17 DIAGNOSIS — F0280 Dementia in other diseases classified elsewhere without behavioral disturbance: Secondary | ICD-10-CM | POA: Diagnosis not present

## 2017-07-17 DIAGNOSIS — I10 Essential (primary) hypertension: Secondary | ICD-10-CM | POA: Diagnosis not present

## 2017-07-17 DIAGNOSIS — K529 Noninfective gastroenteritis and colitis, unspecified: Secondary | ICD-10-CM | POA: Diagnosis not present

## 2017-07-18 DIAGNOSIS — G309 Alzheimer's disease, unspecified: Secondary | ICD-10-CM | POA: Diagnosis not present

## 2017-07-18 DIAGNOSIS — K529 Noninfective gastroenteritis and colitis, unspecified: Secondary | ICD-10-CM | POA: Diagnosis not present

## 2017-07-18 DIAGNOSIS — F329 Major depressive disorder, single episode, unspecified: Secondary | ICD-10-CM | POA: Diagnosis not present

## 2017-07-18 DIAGNOSIS — E440 Moderate protein-calorie malnutrition: Secondary | ICD-10-CM | POA: Diagnosis not present

## 2017-07-18 DIAGNOSIS — F0280 Dementia in other diseases classified elsewhere without behavioral disturbance: Secondary | ICD-10-CM | POA: Diagnosis not present

## 2017-07-18 DIAGNOSIS — I10 Essential (primary) hypertension: Secondary | ICD-10-CM | POA: Diagnosis not present

## 2017-07-18 DIAGNOSIS — M1990 Unspecified osteoarthritis, unspecified site: Secondary | ICD-10-CM | POA: Diagnosis not present

## 2017-07-18 DIAGNOSIS — K219 Gastro-esophageal reflux disease without esophagitis: Secondary | ICD-10-CM | POA: Diagnosis not present

## 2017-07-18 DIAGNOSIS — I739 Peripheral vascular disease, unspecified: Secondary | ICD-10-CM | POA: Diagnosis not present

## 2017-07-20 DIAGNOSIS — M1990 Unspecified osteoarthritis, unspecified site: Secondary | ICD-10-CM | POA: Diagnosis not present

## 2017-07-20 DIAGNOSIS — I739 Peripheral vascular disease, unspecified: Secondary | ICD-10-CM | POA: Diagnosis not present

## 2017-07-20 DIAGNOSIS — E440 Moderate protein-calorie malnutrition: Secondary | ICD-10-CM | POA: Diagnosis not present

## 2017-07-20 DIAGNOSIS — K529 Noninfective gastroenteritis and colitis, unspecified: Secondary | ICD-10-CM | POA: Diagnosis not present

## 2017-07-20 DIAGNOSIS — K219 Gastro-esophageal reflux disease without esophagitis: Secondary | ICD-10-CM | POA: Diagnosis not present

## 2017-07-20 DIAGNOSIS — G309 Alzheimer's disease, unspecified: Secondary | ICD-10-CM | POA: Diagnosis not present

## 2017-07-20 DIAGNOSIS — F329 Major depressive disorder, single episode, unspecified: Secondary | ICD-10-CM | POA: Diagnosis not present

## 2017-07-20 DIAGNOSIS — I10 Essential (primary) hypertension: Secondary | ICD-10-CM | POA: Diagnosis not present

## 2017-07-20 DIAGNOSIS — F0280 Dementia in other diseases classified elsewhere without behavioral disturbance: Secondary | ICD-10-CM | POA: Diagnosis not present

## 2017-07-22 DIAGNOSIS — K529 Noninfective gastroenteritis and colitis, unspecified: Secondary | ICD-10-CM | POA: Diagnosis not present

## 2017-07-22 DIAGNOSIS — M1990 Unspecified osteoarthritis, unspecified site: Secondary | ICD-10-CM | POA: Diagnosis not present

## 2017-07-22 DIAGNOSIS — I10 Essential (primary) hypertension: Secondary | ICD-10-CM | POA: Diagnosis not present

## 2017-07-22 DIAGNOSIS — F0280 Dementia in other diseases classified elsewhere without behavioral disturbance: Secondary | ICD-10-CM | POA: Diagnosis not present

## 2017-07-22 DIAGNOSIS — F329 Major depressive disorder, single episode, unspecified: Secondary | ICD-10-CM | POA: Diagnosis not present

## 2017-07-22 DIAGNOSIS — G309 Alzheimer's disease, unspecified: Secondary | ICD-10-CM | POA: Diagnosis not present

## 2017-07-22 DIAGNOSIS — E440 Moderate protein-calorie malnutrition: Secondary | ICD-10-CM | POA: Diagnosis not present

## 2017-07-22 DIAGNOSIS — I739 Peripheral vascular disease, unspecified: Secondary | ICD-10-CM | POA: Diagnosis not present

## 2017-07-22 DIAGNOSIS — K219 Gastro-esophageal reflux disease without esophagitis: Secondary | ICD-10-CM | POA: Diagnosis not present

## 2017-07-24 ENCOUNTER — Other Ambulatory Visit: Payer: Self-pay

## 2017-07-24 ENCOUNTER — Emergency Department (HOSPITAL_COMMUNITY)
Admission: EM | Admit: 2017-07-24 | Discharge: 2017-07-24 | Disposition: A | Discharge status: Home / Self Care | Payer: Medicare HMO | Attending: Emergency Medicine | Admitting: Emergency Medicine

## 2017-07-24 DIAGNOSIS — Z79899 Other long term (current) drug therapy: Secondary | ICD-10-CM | POA: Diagnosis not present

## 2017-07-24 DIAGNOSIS — K219 Gastro-esophageal reflux disease without esophagitis: Secondary | ICD-10-CM | POA: Diagnosis not present

## 2017-07-24 DIAGNOSIS — I739 Peripheral vascular disease, unspecified: Secondary | ICD-10-CM | POA: Diagnosis not present

## 2017-07-24 DIAGNOSIS — R51 Headache: Secondary | ICD-10-CM | POA: Insufficient documentation

## 2017-07-24 DIAGNOSIS — B029 Zoster without complications: Secondary | ICD-10-CM | POA: Diagnosis not present

## 2017-07-24 DIAGNOSIS — K529 Noninfective gastroenteritis and colitis, unspecified: Secondary | ICD-10-CM | POA: Diagnosis not present

## 2017-07-24 DIAGNOSIS — F0390 Unspecified dementia without behavioral disturbance: Secondary | ICD-10-CM | POA: Insufficient documentation

## 2017-07-24 DIAGNOSIS — F0280 Dementia in other diseases classified elsewhere without behavioral disturbance: Secondary | ICD-10-CM | POA: Diagnosis not present

## 2017-07-24 DIAGNOSIS — R402441 Other coma, without documented Glasgow coma scale score, or with partial score reported, in the field [EMT or ambulance]: Secondary | ICD-10-CM | POA: Diagnosis not present

## 2017-07-24 DIAGNOSIS — R69 Illness, unspecified: Secondary | ICD-10-CM | POA: Diagnosis not present

## 2017-07-24 DIAGNOSIS — I10 Essential (primary) hypertension: Secondary | ICD-10-CM | POA: Diagnosis not present

## 2017-07-24 DIAGNOSIS — R1013 Epigastric pain: Secondary | ICD-10-CM | POA: Diagnosis not present

## 2017-07-24 DIAGNOSIS — G309 Alzheimer's disease, unspecified: Secondary | ICD-10-CM | POA: Diagnosis not present

## 2017-07-24 DIAGNOSIS — F329 Major depressive disorder, single episode, unspecified: Secondary | ICD-10-CM | POA: Diagnosis not present

## 2017-07-24 DIAGNOSIS — M1990 Unspecified osteoarthritis, unspecified site: Secondary | ICD-10-CM | POA: Diagnosis not present

## 2017-07-24 DIAGNOSIS — R519 Headache, unspecified: Secondary | ICD-10-CM

## 2017-07-24 DIAGNOSIS — E440 Moderate protein-calorie malnutrition: Secondary | ICD-10-CM | POA: Diagnosis not present

## 2017-07-24 LAB — URINALYSIS, ROUTINE W REFLEX MICROSCOPIC
Bilirubin Urine: NEGATIVE
Glucose, UA: NEGATIVE mg/dL
Hgb urine dipstick: NEGATIVE
Ketones, ur: 5 mg/dL — AB
Leukocytes, UA: NEGATIVE
Nitrite: NEGATIVE
Protein, ur: NEGATIVE mg/dL
Specific Gravity, Urine: 1.009 (ref 1.005–1.030)
pH: 9 — ABNORMAL HIGH (ref 5.0–8.0)

## 2017-07-24 MED ORDER — VALACYCLOVIR HCL 500 MG PO TABS
1000.0000 mg | ORAL_TABLET | Freq: Once | ORAL | Status: AC
  Administered 2017-07-24: 1000 mg via ORAL
  Filled 2017-07-24: qty 2

## 2017-07-24 MED ORDER — VALACYCLOVIR HCL 1 G PO TABS: 1000.0000 mg | ORAL_TABLET | Freq: Three times a day (TID) | ORAL | 0 refills | Status: DC

## 2017-07-24 MED ORDER — ACETAMINOPHEN 325 MG PO TABS: 650.0000 mg | ORAL_TABLET | Freq: Four times a day (QID) | ORAL | 0 refills | Status: AC | PRN

## 2017-07-24 NOTE — ED Triage Notes (Signed)
Patient BIB [facility] from home family reports pt c/o rt face and head pain. Redness and swelling noted to rt cheek. Pt has hx of dementia, oriented to self. Pt c/o cp in the ambulance then switched complaint to abdominal pain when arriving to hospital. Pt family reports pt may have fallen but unknown when.

## 2017-07-24 NOTE — Discharge Instructions (Addendum)
Please take Valtrex 3 times daily for the next week.  Tylenol for pain.  These follow-up with your primary care doctor early next week.  Return to the emergency department for worsening pain, fevers or chills, headache, numbness or weakness, or if patient is not acting like herself or complains of any other new or concerning symptoms.

## 2017-07-24 NOTE — Progress Notes (Signed)
[facility]  Patient Status: Active (receiving services up to time of hospitalization)  [facility] is providing the following services: RN, PT, OT, ST and MSW  If patient discharges after hours, please call [phone].   [name] 07/24/2017, 4:38 PM

## 2017-07-24 NOTE — Progress Notes (Addendum)
Consult request has been received. CSW attempting to follow up at present time.  Pt's EPD requested that CSW contact pt's daughter to offer information and resources on future placement advice based on the experience of the CSW DEPT should the pt ever need possible placment in the future.  CSW called pt's daughter [name] [name] at ph: [phone] and counseled pt's daughter on SNF's, ALF's and Memory Care, how they work, who is qualified to go there, insurances that do and don't pay for them and how it is paid for otherwise.  CSW will provide pt's daughter with a list of Assisted Living Facilities in the [place] area with locked and without locked units on Saturday 07/25/17 when the pt's daughter arrives for work in the [facility] ED at the [employer].  Pt's daughter was appreciative and thanked the CSW and voiced understanding that the pt's niece is with the pt and that the pt's niece is transporting the pt home.  7:45 PM EPD and RN updated.  Please reconsult if future social work needs arise.  CSW signing off, as social work intervention is no longer needed.  [name]. Shenee Wignall, LCSW, LCAS, CSI Clinical Social Worker Ph: [phone]

## 2017-07-24 NOTE — ED Notes (Signed)
PATIENT REFUSING LAB COLLECTION

## 2017-07-24 NOTE — ED Provider Notes (Signed)
[facility] DEPT Provider Note   CSN: [id number] Arrival date & time: 07/24/17  1432     History   Chief Complaint Chief Complaint  Patient presents with  . Facial Pain    HPI [name] is a 84 y.o. female.  [name] is a 84 y.o. Female with history of dementia, hypertension, GERD, and peripheral neuropathy, who presents to the ED via EMS for evaluation of facial pain.  On arrival patient repeatedly states that nothing is wrong with her, patient states "I am not sick I am just a little crazy".  Patient denies any facial pain, headaches, jaw pain, vision changes, fevers or chills.  Patient denies any recent falls.  She denies any chest pain, shortness of breath, abdominal pain or any other symptoms.  Patient repeatedly states that she does not need to be here in the hospital.  Talk with patient's daughter, [name] who reports the patient lives with her Benjaman Pott, and this morning the patient was complaining of pain over the side of her face, and marked reported the patient was crying and moaning out in pain, she was not wanting to eat lunch and went back to bed.  When patient has home physical therapy provider arrived patient was noted to be hypertensive, and EMS was called.  In speaking with the patient multiple times, as well as patient speaking with her daughter [name] patient denies any facial pain, headache or any of the previous symptoms she was having at this time.  HEENT reports she does have history of worsening dementia, and this tends to fluctuate and is often worse later in the day, but she and her sister did not feel that she was exhibiting any acute change or an acutely altered mental status.  Patient is typically oriented to self.  Level 5 caveat: Dementia     Past Medical History:  Diagnosis Date  . Abnormality of gait   . Alzheimer's disease   . Calculus of kidney   . Closed fracture of seventh cervical vertebra without  mention of spinal cord injury   . Depressive disorder, not elsewhere classified   . Edema   . Edema   . External hemorrhoids without mention of complication   . Gastric ulcer, unspecified as acute or chronic, without mention of hemorrhage, perforation, or obstruction   . GERD (gastroesophageal reflux disease)   . Hypertension   . Insomnia, unspecified   . Loss of weight   . Osteoarthrosis, unspecified whether generalized or localized, unspecified site   . Other protein-calorie malnutrition   . Pain in thoracic spine   . Personal history of fall   . Unspecified constipation   . Unspecified hereditary and idiopathic peripheral neuropathy   . Unspecified hypothyroidism   . Unspecified vitamin D deficiency   . Urinary frequency     Patient Active Problem List   Diagnosis Date Noted  . BRBPR (bright red blood per rectum) 06/30/2017  . Colitis 06/30/2017  . Protein-calorie malnutrition, severe 11/13/2016  . Multiple falls   . Encephalopathy   . Agitation   . Slurred speech 11/10/2016  . Vitamin D deficiency 03/07/2015  . Hx of fracture of vertebral column 12/28/2014  . Constipation 07/27/2013  . Fracture of vertebra 07/27/2013  . Loss of weight 07/13/2013  . Peripheral vascular disease, unspecified ([place]) 07/13/2013  . Epigastric mass 07/13/2013  . Dementia with behavioral disturbance   . Major depression ([place])   . Insomnia, unspecified   .  Fall 09/07/2012  . Fracture of right distal radius 09/07/2012  . Hypothyroidism 08/18/2006  . HYPERCHOLESTEROLEMIA 08/18/2006  . Essential hypertension 08/18/2006  . Restless legs 08/18/2006    Past Surgical History:  Procedure Laterality Date  . TEE WITHOUT CARDIOVERSION N/A 11/13/2016   Procedure: TRANSESOPHAGEAL ECHOCARDIOGRAM (TEE);  Surgeon: Jerline Pain, MD;  Location[facility] ENDOSCOPY;  Service: Cardiovascular;  Laterality: N/A;  . WRIST SURGERY Right 2013     OB History   None      Home Medications    Prior to Admission  medications   Medication Sig Start Date End Date Taking? Authorizing Provider  amitriptyline (ELAVIL) 10 MG tablet Take 1 tablet (10 mg total) by mouth at bedtime. 07/14/17   [name], NP  amitriptyline (ELAVIL) 25 MG tablet Take 1 tablet (25 mg total) by mouth at bedtime. 07/14/17   [name], NP  amLODipine (NORVASC) 5 MG tablet Take 1 tablet (5 mg total) by mouth daily. 04/27/17   [name], NP  levothyroxine (SYNTHROID, LEVOTHROID) 88 MCG tablet TAKE ONE TABLET  ONCE DAILY 30 MINUTES BEFORE BREAKFAST FOR THYROID 05/05/17   [name], NP  NAMZARIC 28-10 MG CP24 TAKE 1 CAPSULE AT BEDTIME. TO STABILIZE MEMORY 05/05/17   [name], NP  senna-docusate (SENNA S) 8.6-50 MG tablet Take 1 tablet by mouth at bedtime. 12/10/16   [name], NP  venlafaxine XR (EFFEXOR-XR) 75 MG 24 hr capsule TAKE 1 CAPSULE  EVERY DAY WITH BREAKFAST 05/05/17   [name], NP  Vitamin D, Ergocalciferol, (DRISDOL) 50000 units CAPS capsule Take one capsule by mouth every 14 days 12/10/16   [name], NP    Family History Family History  Problem Relation Age of Onset  . Cancer Mother   . CVA Father   . Cancer Brother   . Alcohol abuse Sister     Social History Social History   Tobacco Use  . Smoking status: Never Smoker  . Smokeless tobacco: Never Used  Substance Use Topics  . Alcohol use: No  . Drug use: No     Allergies   Patient has no known allergies.   Review of Systems Review of Systems  Unable to perform ROS: Dementia     Physical Exam Updated Vital Signs BP (!) 147/88 (BP Location: Left Arm)   Pulse 80   Temp 97.9 F (36.6 C) (Oral)   Resp 16   SpO2 100%   Physical Exam  Constitutional: She appears well-developed and well-nourished. No distress.  HENT:  Head: Normocephalic and atraumatic.    Mouth/Throat: Oropharynx is clear and moist.  Eyes: Pupils are equal, round, and reactive to light. Conjunctivae and EOM are  normal. Right eye exhibits no discharge. Left eye exhibits no discharge.  No nystagmus  Neck: Normal range of motion. Neck supple.  Cardiovascular: Normal rate, regular rhythm, normal heart sounds and intact distal pulses.  Pulses:      Radial pulses are 2+ on the right side, and 2+ on the left side.       Dorsalis pedis pulses are 2+ on the right side, and 2+ on the left side.       Posterior tibial pulses are 2+ on the right side, and 2+ on the left side.  Pulmonary/Chest: Effort normal and breath sounds normal. No stridor. No respiratory distress. She has no wheezes. She has no rales.  Respirations equal and unlabored, patient able to speak in full sentences, lungs  clear to auscultation bilaterally  Abdominal: Soft. Bowel sounds are normal. She exhibits no distension and no mass. There is no tenderness. There is no guarding.  Abdomen soft, nondistended, nontender to palpation in all quadrants without guarding or peritoneal signs  Musculoskeletal: She exhibits no edema or deformity.  Neurological: She is alert. Coordination normal.  Speech is clear, able to follow commands, oriented to self (which is patients baseline most of the time) CN III-XII intact Normal strength in upper and lower extremities bilaterally including dorsiflexion and plantar flexion, strong and equal grip strength Sensation normal to light and sharp touch Moves extremities without ataxia, coordination intact No pronator drift   Skin: Skin is warm and dry. Capillary refill takes less than 2 seconds. She is not diaphoretic.  Psychiatric: She has a normal mood and affect. Her behavior is normal.  Nursing note and vitals reviewed.    ED Treatments / Results  Labs (all labs ordered are listed, but only abnormal results are displayed) Labs Reviewed  URINALYSIS, ROUTINE W REFLEX MICROSCOPIC - Abnormal; Notable for the following components:      Result Value   Color, Urine STRAW (*)    APPearance HAZY (*)    pH 9.0  (*)    Ketones, ur 5 (*)    All other components within normal limits  BASIC METABOLIC PANEL  CBC    EKG None  Radiology No results found.  Procedures Procedures (including critical care time)  Medications Ordered in ED Medications  valACYclovir (VALTREX) tablet 1,000 mg (1,000 mg Oral Given 07/24/17 1839)     Initial Impression / Assessment and Plan / ED Course  I have reviewed the triage vital signs and the nursing notes.  Pertinent labs & imaging results that were available during my care of the patient were reviewed by me and considered in my medical decision making (see chart for details).  Patient presents for evaluation of facial pain, she does have history of dementia and appears to be sundowning and is currently denying any facial pain, visual change, headache or any other symptoms and repeatedly states she feels completely fine and wants to go home.  On exam she does have some mild erythema extending from the left temple to under the left eye.  No evidence of vision change.  Normal neurologic exam, there is no focal tenderness over the temporal artery or palpable cord.  No other focal findings on exam.  Discussed with Dr. Tyrone Nine who evaluated the patient as well, and suggest patient may be developing shingles.  Had multiple conversations with patient's daughter as we were able to obtain a urinalysis which showed no evidence of infection but patient is refusing any other work-up including blood work or imaging given that patient has a reassuring neurologic exam, and is currently denying any symptoms do not feel would be necessary to restrain patient and complete these tests against her will, discussed this with the daughter who is in agreement, daughter expresses frustration about worsening dementia and concerned that it may be time for placement, social work consult placed he will provide daughter with resources, discussed with the daughter that it is unlikely on a weekend to be  able to find placement directly from the emergency department.  Will start patient on Valtrex, daughter is in agreement to old off on any further evaluation or head imaging at this time.  Patient to follow-up with your primary care doctor.  Return precautions discussed.  Patient's niece is at the bedside and expresses understanding  and agreement with plan and will be taking the patient home herself.  Return precautions discussed.  Patient discussed with Dr. Tyrone Nine, who saw patient as well and agrees with plan.   Final Clinical Impressions(s) / ED Diagnoses   Final diagnoses:  Facial pain  Herpes zoster without complication    ED Discharge Orders        Ordered    valACYclovir (VALTREX) 1000 MG tablet  3 times daily     07/24/17 1912    acetaminophen (TYLENOL) 325 MG tablet  Every 6 hours PRN     07/24/17 1912       Jacqlyn Larsen, PA-C 07/25/17 [place], Dan, DO 07/27/17 1952

## 2017-07-25 DIAGNOSIS — F329 Major depressive disorder, single episode, unspecified: Secondary | ICD-10-CM | POA: Diagnosis not present

## 2017-07-25 DIAGNOSIS — K219 Gastro-esophageal reflux disease without esophagitis: Secondary | ICD-10-CM | POA: Diagnosis not present

## 2017-07-25 DIAGNOSIS — M1990 Unspecified osteoarthritis, unspecified site: Secondary | ICD-10-CM | POA: Diagnosis not present

## 2017-07-25 DIAGNOSIS — G309 Alzheimer's disease, unspecified: Secondary | ICD-10-CM | POA: Diagnosis not present

## 2017-07-25 DIAGNOSIS — F0280 Dementia in other diseases classified elsewhere without behavioral disturbance: Secondary | ICD-10-CM | POA: Diagnosis not present

## 2017-07-25 DIAGNOSIS — I10 Essential (primary) hypertension: Secondary | ICD-10-CM | POA: Diagnosis not present

## 2017-07-25 DIAGNOSIS — I739 Peripheral vascular disease, unspecified: Secondary | ICD-10-CM | POA: Diagnosis not present

## 2017-07-25 DIAGNOSIS — K529 Noninfective gastroenteritis and colitis, unspecified: Secondary | ICD-10-CM | POA: Diagnosis not present

## 2017-07-25 DIAGNOSIS — E440 Moderate protein-calorie malnutrition: Secondary | ICD-10-CM | POA: Diagnosis not present

## 2017-07-28 DIAGNOSIS — F0280 Dementia in other diseases classified elsewhere without behavioral disturbance: Secondary | ICD-10-CM | POA: Diagnosis not present

## 2017-07-28 DIAGNOSIS — M1990 Unspecified osteoarthritis, unspecified site: Secondary | ICD-10-CM | POA: Diagnosis not present

## 2017-07-28 DIAGNOSIS — F329 Major depressive disorder, single episode, unspecified: Secondary | ICD-10-CM | POA: Diagnosis not present

## 2017-07-28 DIAGNOSIS — G309 Alzheimer's disease, unspecified: Secondary | ICD-10-CM | POA: Diagnosis not present

## 2017-07-28 DIAGNOSIS — E440 Moderate protein-calorie malnutrition: Secondary | ICD-10-CM | POA: Diagnosis not present

## 2017-07-28 DIAGNOSIS — I739 Peripheral vascular disease, unspecified: Secondary | ICD-10-CM | POA: Diagnosis not present

## 2017-07-28 DIAGNOSIS — K219 Gastro-esophageal reflux disease without esophagitis: Secondary | ICD-10-CM | POA: Diagnosis not present

## 2017-07-28 DIAGNOSIS — K529 Noninfective gastroenteritis and colitis, unspecified: Secondary | ICD-10-CM | POA: Diagnosis not present

## 2017-07-28 DIAGNOSIS — I10 Essential (primary) hypertension: Secondary | ICD-10-CM | POA: Diagnosis not present

## 2017-07-29 DIAGNOSIS — F0280 Dementia in other diseases classified elsewhere without behavioral disturbance: Secondary | ICD-10-CM | POA: Diagnosis not present

## 2017-07-29 DIAGNOSIS — I10 Essential (primary) hypertension: Secondary | ICD-10-CM | POA: Diagnosis not present

## 2017-07-29 DIAGNOSIS — E440 Moderate protein-calorie malnutrition: Secondary | ICD-10-CM | POA: Diagnosis not present

## 2017-07-29 DIAGNOSIS — I739 Peripheral vascular disease, unspecified: Secondary | ICD-10-CM | POA: Diagnosis not present

## 2017-07-29 DIAGNOSIS — K219 Gastro-esophageal reflux disease without esophagitis: Secondary | ICD-10-CM | POA: Diagnosis not present

## 2017-07-29 DIAGNOSIS — M1990 Unspecified osteoarthritis, unspecified site: Secondary | ICD-10-CM | POA: Diagnosis not present

## 2017-07-29 DIAGNOSIS — F329 Major depressive disorder, single episode, unspecified: Secondary | ICD-10-CM | POA: Diagnosis not present

## 2017-07-29 DIAGNOSIS — G309 Alzheimer's disease, unspecified: Secondary | ICD-10-CM | POA: Diagnosis not present

## 2017-07-29 DIAGNOSIS — K529 Noninfective gastroenteritis and colitis, unspecified: Secondary | ICD-10-CM | POA: Diagnosis not present

## 2017-07-31 DIAGNOSIS — E440 Moderate protein-calorie malnutrition: Secondary | ICD-10-CM | POA: Diagnosis not present

## 2017-07-31 DIAGNOSIS — K219 Gastro-esophageal reflux disease without esophagitis: Secondary | ICD-10-CM | POA: Diagnosis not present

## 2017-07-31 DIAGNOSIS — G309 Alzheimer's disease, unspecified: Secondary | ICD-10-CM | POA: Diagnosis not present

## 2017-07-31 DIAGNOSIS — M1990 Unspecified osteoarthritis, unspecified site: Secondary | ICD-10-CM | POA: Diagnosis not present

## 2017-07-31 DIAGNOSIS — F0280 Dementia in other diseases classified elsewhere without behavioral disturbance: Secondary | ICD-10-CM | POA: Diagnosis not present

## 2017-07-31 DIAGNOSIS — F329 Major depressive disorder, single episode, unspecified: Secondary | ICD-10-CM | POA: Diagnosis not present

## 2017-07-31 DIAGNOSIS — I10 Essential (primary) hypertension: Secondary | ICD-10-CM | POA: Diagnosis not present

## 2017-07-31 DIAGNOSIS — I739 Peripheral vascular disease, unspecified: Secondary | ICD-10-CM | POA: Diagnosis not present

## 2017-07-31 DIAGNOSIS — K529 Noninfective gastroenteritis and colitis, unspecified: Secondary | ICD-10-CM | POA: Diagnosis not present

## 2017-08-03 DIAGNOSIS — E440 Moderate protein-calorie malnutrition: Secondary | ICD-10-CM | POA: Diagnosis not present

## 2017-08-03 DIAGNOSIS — I739 Peripheral vascular disease, unspecified: Secondary | ICD-10-CM | POA: Diagnosis not present

## 2017-08-03 DIAGNOSIS — I10 Essential (primary) hypertension: Secondary | ICD-10-CM | POA: Diagnosis not present

## 2017-08-03 DIAGNOSIS — K529 Noninfective gastroenteritis and colitis, unspecified: Secondary | ICD-10-CM | POA: Diagnosis not present

## 2017-08-03 DIAGNOSIS — G309 Alzheimer's disease, unspecified: Secondary | ICD-10-CM | POA: Diagnosis not present

## 2017-08-03 DIAGNOSIS — F0280 Dementia in other diseases classified elsewhere without behavioral disturbance: Secondary | ICD-10-CM | POA: Diagnosis not present

## 2017-08-03 DIAGNOSIS — K219 Gastro-esophageal reflux disease without esophagitis: Secondary | ICD-10-CM | POA: Diagnosis not present

## 2017-08-03 DIAGNOSIS — M1990 Unspecified osteoarthritis, unspecified site: Secondary | ICD-10-CM | POA: Diagnosis not present

## 2017-08-03 DIAGNOSIS — F329 Major depressive disorder, single episode, unspecified: Secondary | ICD-10-CM | POA: Diagnosis not present

## 2017-08-04 ENCOUNTER — Telehealth: Payer: Self-pay | Admitting: *Deleted

## 2017-08-04 DIAGNOSIS — I10 Essential (primary) hypertension: Secondary | ICD-10-CM | POA: Diagnosis not present

## 2017-08-04 DIAGNOSIS — K529 Noninfective gastroenteritis and colitis, unspecified: Secondary | ICD-10-CM | POA: Diagnosis not present

## 2017-08-04 DIAGNOSIS — M1990 Unspecified osteoarthritis, unspecified site: Secondary | ICD-10-CM | POA: Diagnosis not present

## 2017-08-04 DIAGNOSIS — I739 Peripheral vascular disease, unspecified: Secondary | ICD-10-CM | POA: Diagnosis not present

## 2017-08-04 DIAGNOSIS — K219 Gastro-esophageal reflux disease without esophagitis: Secondary | ICD-10-CM | POA: Diagnosis not present

## 2017-08-04 DIAGNOSIS — F0280 Dementia in other diseases classified elsewhere without behavioral disturbance: Secondary | ICD-10-CM | POA: Diagnosis not present

## 2017-08-04 DIAGNOSIS — G309 Alzheimer's disease, unspecified: Secondary | ICD-10-CM | POA: Diagnosis not present

## 2017-08-04 DIAGNOSIS — E440 Moderate protein-calorie malnutrition: Secondary | ICD-10-CM | POA: Diagnosis not present

## 2017-08-04 DIAGNOSIS — F329 Major depressive disorder, single episode, unspecified: Secondary | ICD-10-CM | POA: Diagnosis not present

## 2017-08-04 NOTE — Telephone Encounter (Signed)
LMOM to return call.

## 2017-08-04 NOTE — Telephone Encounter (Signed)
Patient daughter, [name], called and stated that the last time patient was seen you had talked about possibly changing patient's medication to help with mood and depression. Thinks this would help with the appetite . Stated that you told her just to call and you would be able to do this. Please Advise.

## 2017-08-04 NOTE — Telephone Encounter (Signed)
Nurse will have daughter call the office to schedule an appointment.

## 2017-08-04 NOTE — Telephone Encounter (Signed)
[name] with [facility] called and stated that patient is having diminished appetite, drinking and eating very little. Also sleeping excessively over the last 24 hours. Vital signs are normal and No complaints and No urinary symptoms. Please Advise.

## 2017-08-04 NOTE — Telephone Encounter (Signed)
Daughter scheduled appointment for 08/12/17.

## 2017-08-04 NOTE — Telephone Encounter (Signed)
Pt with progressive dementia, palliative care may be a good option for them at this time. She has a hx of decrease oral intake, unsure if increase sleep is a normal behavior for her. Has the daughter been notified? If she is concerned we can always she her in office as well.

## 2017-08-04 NOTE — Telephone Encounter (Signed)
However recommend seeing her in office if there is new issues (like the excessive sleepiness) some medication that could help with appetite will increase fatigue. She has tried appetite stimulates in the past that have not benefited her.

## 2017-08-12 ENCOUNTER — Encounter: Payer: Self-pay | Admitting: Nurse Practitioner

## 2017-08-12 ENCOUNTER — Other Ambulatory Visit: Payer: Self-pay | Admitting: Nurse Practitioner

## 2017-08-12 ENCOUNTER — Ambulatory Visit (INDEPENDENT_AMBULATORY_CARE_PROVIDER_SITE_OTHER): Payer: Medicare HMO | Admitting: Nurse Practitioner

## 2017-08-12 VITALS — BP 128/76 | HR 81 | Temp 98.1°F | Ht 64.0 in | Wt 132.0 lb

## 2017-08-12 DIAGNOSIS — G308 Other Alzheimer's disease: Secondary | ICD-10-CM

## 2017-08-12 DIAGNOSIS — F02818 Dementia in other diseases classified elsewhere, unspecified severity, with other behavioral disturbance: Secondary | ICD-10-CM

## 2017-08-12 DIAGNOSIS — F0281 Dementia in other diseases classified elsewhere with behavioral disturbance: Secondary | ICD-10-CM | POA: Diagnosis not present

## 2017-08-12 DIAGNOSIS — R531 Weakness: Secondary | ICD-10-CM | POA: Diagnosis not present

## 2017-08-12 DIAGNOSIS — R5383 Other fatigue: Secondary | ICD-10-CM | POA: Diagnosis not present

## 2017-08-12 DIAGNOSIS — F332 Major depressive disorder, recurrent severe without psychotic features: Secondary | ICD-10-CM

## 2017-08-12 DIAGNOSIS — E039 Hypothyroidism, unspecified: Secondary | ICD-10-CM

## 2017-08-12 DIAGNOSIS — R634 Abnormal weight loss: Secondary | ICD-10-CM | POA: Diagnosis not present

## 2017-08-12 LAB — POCT URINALYSIS DIPSTICK
Appearance: NORMAL
Bilirubin, UA: NEGATIVE
Blood, UA: NEGATIVE
Glucose, UA: NEGATIVE
Ketones, UA: NEGATIVE
Leukocytes, UA: NEGATIVE
Nitrite, UA: NEGATIVE
Protein, UA: NEGATIVE
Spec Grav, UA: 1.02 (ref 1.010–1.025)
Urobilinogen, UA: 0.2 E.U./dL
pH, UA: 6 (ref 5.0–8.0)

## 2017-08-12 LAB — COMPLETE METABOLIC PANEL WITH GFR
AG Ratio: 1.5 (calc) (ref 1.0–2.5)
ALT: 9 U/L (ref 6–29)
AST: 19 U/L (ref 10–35)
Albumin: 4.6 g/dL (ref 3.6–5.1)
Alkaline phosphatase (APISO): 99 U/L (ref 33–130)
BUN/Creatinine Ratio: 17 (calc) (ref 6–22)
BUN: 16 mg/dL (ref 7–25)
CO2: 30 mmol/L (ref 20–32)
Calcium: 9.6 mg/dL (ref 8.6–10.4)
Chloride: 102 mmol/L (ref 98–110)
Creat: 0.93 mg/dL — ABNORMAL HIGH (ref 0.60–0.88)
GFR, Est African American: 65 mL/min/{1.73_m2} (ref >=60)
GFR, Est Non African American: 56 mL/min/{1.73_m2} — ABNORMAL LOW (ref >=60)
Globulin: 3 g/dL (calc) (ref 1.9–3.7)
Glucose, Bld: 92 mg/dL (ref 65–139)
Potassium: 3.2 mmol/L — ABNORMAL LOW (ref 3.5–5.3)
Sodium: 142 mmol/L (ref 135–146)
Total Bilirubin: 0.5 mg/dL (ref 0.2–1.2)
Total Protein: 7.6 g/dL (ref 6.1–8.1)

## 2017-08-12 LAB — CBC WITH DIFFERENTIAL/PLATELET
Basophils Absolute: 38 cells/uL (ref 0–200)
Basophils Relative: 0.5 %
Eosinophils Absolute: 190 cells/uL (ref 15–500)
Eosinophils Relative: 2.5 %
HCT: 41.3 % (ref 35.0–45.0)
Hemoglobin: 14.6 g/dL (ref 11.7–15.5)
Lymphs Abs: 2212 cells/uL (ref 850–3900)
MCH: 34.2 pg — ABNORMAL HIGH (ref 27.0–33.0)
MCHC: 35.4 g/dL (ref 32.0–36.0)
MCV: 96.7 fL (ref 80.0–100.0)
MPV: 9.9 fL (ref 7.5–12.5)
Monocytes Relative: 7.5 %
Neutro Abs: 4590 cells/uL (ref 1500–7800)
Neutrophils Relative %: 60.4 %
Platelets: 266 10*3/uL (ref 140–400)
RBC: 4.27 10*6/uL (ref 3.80–5.10)
RDW: 12.9 % (ref 11.0–15.0)
Total Lymphocyte: 29.1 %
WBC mixed population: 570 cells/uL (ref 200–950)
WBC: 7.6 10*3/uL (ref 3.8–10.8)

## 2017-08-12 LAB — TSH: TSH: 0.49 mIU/L (ref 0.40–4.50)

## 2017-08-12 MED ORDER — BUPROPION HCL ER (XL) 150 MG PO TB24: 150.0000 mg | ORAL_TABLET | Freq: Every day | ORAL | 1 refills | Status: DC

## 2017-08-12 NOTE — Patient Instructions (Signed)
Start Wellbutrin 150 mg by mouth daily   Follow up in 6-8 weeks with Dr Mariea Clonts.

## 2017-08-12 NOTE — Progress Notes (Signed)
Careteam: Patient Care Team: [name], NP as PCP - General (Geriatric Medicine)  Advanced Directive information Does Patient Have a Medical Advance Directive?: No  No Known Allergies  Chief Complaint  Patient presents with  . Acute Visit    Pt is being seen due to excessive sleepiness and no appetite for 3 weeks.      HPI: Patient is a 84 y.o. female seen in the office today due to excessive sleepiness and not eating.  Reports diminished appetite and fatigue is not new but home health nurse was very concerned. Daughter reports that home health nurse stated she needed to be on medication for appetite. Pt has already been on megace and Remeron without benefit.  Daughter reports appetite has been bad. Will eat when she wants to but otherwise will not. Not eating her sweets like she used to. Denies nausea, vomiting or constipation. No abdominal pain.  Will not do ensure, has tired to liberalized diet but still will not eat   Daughter does report increase agitation and low mood, discussed changing antidepressant. Little interest in doing things. Withdrawn, lack of energy Currently on Effexor 75 mg by mouth daily.   Off amitriptyline (previously on due to pain in legs) no complaints with this currently.   Has had increased fatigue for a while now but over the past month it has significantly gotten worse.   Review of Systems:  Review of Systems  Unable to perform ROS: Dementia    Past Medical History:  Diagnosis Date  . Abnormality of gait   . Alzheimer's disease   . Calculus of kidney   . Closed fracture of seventh cervical vertebra without mention of spinal cord injury   . Depressive disorder, not elsewhere classified   . Edema   . Edema   . External hemorrhoids without mention of complication   . Gastric ulcer, unspecified as acute or chronic, without mention of hemorrhage, perforation, or obstruction   . GERD (gastroesophageal reflux disease)   . Hypertension    . Insomnia, unspecified   . Loss of weight   . Osteoarthrosis, unspecified whether generalized or localized, unspecified site   . Other protein-calorie malnutrition   . Pain in thoracic spine   . Personal history of fall   . Unspecified constipation   . Unspecified hereditary and idiopathic peripheral neuropathy   . Unspecified hypothyroidism   . Unspecified vitamin D deficiency   . Urinary frequency    Past Surgical History:  Procedure Laterality Date  . TEE WITHOUT CARDIOVERSION N/A 11/13/2016   Procedure: TRANSESOPHAGEAL ECHOCARDIOGRAM (TEE);  Surgeon: Jerline Pain, MD;  Location: [facility] ENDOSCOPY;  Service: Cardiovascular;  Laterality: N/A;  . WRIST SURGERY Right 2013   Social History:   reports that she has never smoked. She has never used smokeless tobacco. She reports that she does not drink alcohol or use drugs.  Family History  Problem Relation Age of Onset  . Cancer Mother   . CVA Father   . Cancer Brother   . Alcohol abuse Sister     Medications: Patient's Medications  New Prescriptions   No medications on file  Previous Medications   ACETAMINOPHEN (TYLENOL) 325 MG TABLET    Take 2 tablets (650 mg total) by mouth every 6 (six) hours as needed.   AMLODIPINE (NORVASC) 5 MG TABLET    Take 1 tablet (5 mg total) by mouth daily.   LEVOTHYROXINE (SYNTHROID, LEVOTHROID) 88 MCG TABLET    TAKE ONE TABLET  ONCE DAILY 30 MINUTES BEFORE BREAKFAST FOR THYROID   NAMZARIC 28-10 MG CP24    TAKE 1 CAPSULE AT BEDTIME. TO STABILIZE MEMORY   SENNA-DOCUSATE (SENNA S) 8.6-50 MG TABLET    Take 1 tablet by mouth at bedtime.   VENLAFAXINE XR (EFFEXOR-XR) 75 MG 24 HR CAPSULE    TAKE 1 CAPSULE  EVERY DAY WITH BREAKFAST   VITAMIN D, ERGOCALCIFEROL, (DRISDOL) 50000 UNITS CAPS CAPSULE    Take one capsule by mouth every 14 days  Modified Medications   No medications on file  Discontinued Medications   AMITRIPTYLINE (ELAVIL) 10 MG TABLET    Take 1 tablet (10 mg total) by mouth at bedtime.    AMITRIPTYLINE (ELAVIL) 25 MG TABLET    Take 1 tablet (25 mg total) by mouth at bedtime.   VALACYCLOVIR (VALTREX) 1000 MG TABLET    Take 1 tablet (1,000 mg total) by mouth 3 (three) times daily.     Physical Exam:  Vitals:   08/12/17 1522  BP: 128/76  Pulse: 81  Temp: 98.1 F (36.7 C)  TempSrc: Oral  SpO2: 96%  Weight: 132 lb (59.9 kg)  Height: 5\' 4"  (1.626 m)   Body mass index is 22.66 kg/m.  Physical Exam  Constitutional: She appears well-developed and well-nourished.  HENT:  Head: Normocephalic and atraumatic.  Nose: Nose normal.  Mouth/Throat: Oropharynx is clear and moist. No oropharyngeal exudate.  MMM, no thrush noted  Eyes: Pupils are equal, round, and reactive to light.  Neck: Normal range of motion. Neck supple.  Cardiovascular: Normal rate, regular rhythm and normal heart sounds.  Pulmonary/Chest: Effort normal and breath sounds normal.  Abdominal: Soft. Bowel sounds are normal. There is tenderness (suprapubic).  Neurological: She is alert.  Alert but fatigue during visit.   Skin: Skin is warm and dry.  Psychiatric: Cognition and memory are impaired. She exhibits a depressed mood. She exhibits abnormal recent memory and abnormal remote memory.   Labs reviewed: Basic Metabolic Panel: Recent Labs    11/10/16 1309 11/10/16 1310  11/11/16 1846  03/06/17 1009 06/29/17 2000 07/01/17 0251 07/08/17 1604  NA 138  --    < > 135   < > 142 139 139 139  K 3.2*  --    < > 3.1*   < > 3.9 3.4* 3.7 3.7  CL 105  --    < > 102   < > 106 104 103 101  CO2 24  --    < > 26   < > 26 22 27 31   GLUCOSE 96  --    < > 129*   < > 81 153* 112* 93  BUN 19  --    < > 10   < > 19 28* 10 19  CREATININE 0.91  --    < > 1.00   < > 1.10* 1.38* 1.05* 1.14*  CALCIUM 8.8*  --    < > 9.0   < > 9.3 9.7 8.7* 9.1  MG 1.9  --   --  2.0  --   --   --   --   --   TSH  --  5.808*  --   --   --  1.80  --   --   --    < > = values in this interval not displayed.   Liver Function Tests: Recent  Labs    11/11/16 0725 11/11/16 1846 12/10/16 1619 06/29/17 2000 07/08/17 1604  AST 26 33 14  27 27  ALT 14 16 9  12* 15  ALKPHOS 62 56  --  102  --   BILITOT 1.2 1.1 0.5 0.5 0.4  PROT 6.4* 6.7 7.2 6.9 6.7  ALBUMIN 3.8 3.8  --  3.7  --    Recent Labs    11/10/16 1309  LIPASE 28   No results for input(s): AMMONIA in the last 8760 hours. CBC: Recent Labs    11/10/16 1309  06/29/17 2000  06/30/17 1855 07/01/17 0251 07/08/17 1604  WBC 7.0   < > 10.6*  --  11.5* 14.7* 6.9  NEUTROABS 4.8  --  7.2  --   --   --  4,195  HGB 12.2   < > 14.2   < > 13.7 14.0 13.2  HCT 34.6*   < > 42.7   < > 40.2 41.7 37.7  MCV 102.1*   < > 101.7*  --  98.8 99.5 96.7  PLT 204   < > 208  --  229 217 251   < > = values in this interval not displayed.   Lipid Panel: Recent Labs    11/11/16 1846  CHOL 156  HDL 61  LDLCALC 87  TRIG 42  CHOLHDL 2.6   TSH: Recent Labs    11/10/16 1310 03/06/17 1009  TSH 5.808* 1.80   A1C: Lab Results  Component Value Date   HGBA1C 5.2 11/11/2016     Assessment/Plan 1. Alzheimer's disease of other onset with behavioral disturbance Progressive decline, has care givers in the home with help with meal times.  Continues on namzaric daily - will place referral for Palliative Care at this time for resources and goals of care.   2. Severe episode of recurrent major depressive disorder, without psychotic features ([place]) -ongoing depression, continues on Effexor, will add Wellbutrin to see if this helps with symptoms    - buPROPion (WELLBUTRIN XL) 150 MG 24 hr tablet; Take 1 tablet (150 mg total) by mouth daily.  Dispense: 30 tablet; Refill: 1  3. Generalized weakness -progressive weakness, currently working with PT/OT   4. Hypothyroidism, unspecified type -continues on synthroid, will follow up TSH - TSH  5. Fatigue, unspecified type -daughter reports progressive decline and increase in fatigue. More recently with worsening of symptoms which could be due  to decline in dementia. Will follow up labs at this time.  -also with weight loss due to poor PO intake (which is expected with progression of dementia) could be contributing to fatigue.  - TSH - COMPLETE METABOLIC PANEL WITH GFR - CBC with Differential/Platelets - Amb Referral to Palliative Care - POC Urinalysis Dipstick- negative.   6. Loss of weight Pt with progressive dementia, decrease appetite and PO intake with loss of weight, has tried remeron and megace in the past without benefits. She will not take ensure or other nutritional supplements and daughter has liberalized diet.   Next appt: 6-8 weeks with Dr Mariea Clonts.  [name]. [place], [facility] [phone]

## 2017-08-13 ENCOUNTER — Other Ambulatory Visit: Payer: Self-pay | Admitting: Nurse Practitioner

## 2017-08-13 DIAGNOSIS — G309 Alzheimer's disease, unspecified: Secondary | ICD-10-CM | POA: Diagnosis not present

## 2017-08-13 DIAGNOSIS — K529 Noninfective gastroenteritis and colitis, unspecified: Secondary | ICD-10-CM | POA: Diagnosis not present

## 2017-08-13 DIAGNOSIS — K219 Gastro-esophageal reflux disease without esophagitis: Secondary | ICD-10-CM | POA: Diagnosis not present

## 2017-08-13 DIAGNOSIS — M1990 Unspecified osteoarthritis, unspecified site: Secondary | ICD-10-CM | POA: Diagnosis not present

## 2017-08-13 DIAGNOSIS — F0280 Dementia in other diseases classified elsewhere without behavioral disturbance: Secondary | ICD-10-CM | POA: Diagnosis not present

## 2017-08-13 DIAGNOSIS — I739 Peripheral vascular disease, unspecified: Secondary | ICD-10-CM | POA: Diagnosis not present

## 2017-08-13 DIAGNOSIS — I10 Essential (primary) hypertension: Secondary | ICD-10-CM | POA: Diagnosis not present

## 2017-08-13 DIAGNOSIS — F329 Major depressive disorder, single episode, unspecified: Secondary | ICD-10-CM | POA: Diagnosis not present

## 2017-08-13 DIAGNOSIS — E440 Moderate protein-calorie malnutrition: Secondary | ICD-10-CM | POA: Diagnosis not present

## 2017-08-13 MED ORDER — POTASSIUM CHLORIDE ER 10 MEQ PO TBCR: 10.0000 meq | EXTENDED_RELEASE_TABLET | Freq: Every day | ORAL | 0 refills | Status: DC

## 2017-08-13 MED FILL — POTASSIUM CL ER 10 MEQ TABL: 10 | 4 days supply | Qty: 4 | Fill #0

## 2017-08-13 NOTE — Addendum Note (Signed)
Addended by: Denyse Amass on: 08/13/2017 09:49 AM   Modules accepted: Orders

## 2017-08-19 DIAGNOSIS — E440 Moderate protein-calorie malnutrition: Secondary | ICD-10-CM | POA: Diagnosis not present

## 2017-08-19 DIAGNOSIS — F0280 Dementia in other diseases classified elsewhere without behavioral disturbance: Secondary | ICD-10-CM | POA: Diagnosis not present

## 2017-08-19 DIAGNOSIS — I10 Essential (primary) hypertension: Secondary | ICD-10-CM | POA: Diagnosis not present

## 2017-08-19 DIAGNOSIS — I739 Peripheral vascular disease, unspecified: Secondary | ICD-10-CM | POA: Diagnosis not present

## 2017-08-19 DIAGNOSIS — G309 Alzheimer's disease, unspecified: Secondary | ICD-10-CM | POA: Diagnosis not present

## 2017-08-19 DIAGNOSIS — K219 Gastro-esophageal reflux disease without esophagitis: Secondary | ICD-10-CM | POA: Diagnosis not present

## 2017-08-19 DIAGNOSIS — K529 Noninfective gastroenteritis and colitis, unspecified: Secondary | ICD-10-CM | POA: Diagnosis not present

## 2017-08-19 DIAGNOSIS — F329 Major depressive disorder, single episode, unspecified: Secondary | ICD-10-CM | POA: Diagnosis not present

## 2017-08-19 DIAGNOSIS — M1990 Unspecified osteoarthritis, unspecified site: Secondary | ICD-10-CM | POA: Diagnosis not present

## 2017-08-26 DIAGNOSIS — F0280 Dementia in other diseases classified elsewhere without behavioral disturbance: Secondary | ICD-10-CM | POA: Diagnosis not present

## 2017-08-26 DIAGNOSIS — E440 Moderate protein-calorie malnutrition: Secondary | ICD-10-CM | POA: Diagnosis not present

## 2017-08-26 DIAGNOSIS — I739 Peripheral vascular disease, unspecified: Secondary | ICD-10-CM | POA: Diagnosis not present

## 2017-08-26 DIAGNOSIS — F329 Major depressive disorder, single episode, unspecified: Secondary | ICD-10-CM | POA: Diagnosis not present

## 2017-08-26 DIAGNOSIS — K219 Gastro-esophageal reflux disease without esophagitis: Secondary | ICD-10-CM | POA: Diagnosis not present

## 2017-08-26 DIAGNOSIS — M1990 Unspecified osteoarthritis, unspecified site: Secondary | ICD-10-CM | POA: Diagnosis not present

## 2017-08-26 DIAGNOSIS — I10 Essential (primary) hypertension: Secondary | ICD-10-CM | POA: Diagnosis not present

## 2017-08-26 DIAGNOSIS — G309 Alzheimer's disease, unspecified: Secondary | ICD-10-CM | POA: Diagnosis not present

## 2017-08-26 DIAGNOSIS — K529 Noninfective gastroenteritis and colitis, unspecified: Secondary | ICD-10-CM | POA: Diagnosis not present

## 2017-08-28 DIAGNOSIS — M1990 Unspecified osteoarthritis, unspecified site: Secondary | ICD-10-CM | POA: Diagnosis not present

## 2017-08-28 DIAGNOSIS — E440 Moderate protein-calorie malnutrition: Secondary | ICD-10-CM | POA: Diagnosis not present

## 2017-08-28 DIAGNOSIS — G309 Alzheimer's disease, unspecified: Secondary | ICD-10-CM | POA: Diagnosis not present

## 2017-08-28 DIAGNOSIS — I10 Essential (primary) hypertension: Secondary | ICD-10-CM | POA: Diagnosis not present

## 2017-08-28 DIAGNOSIS — K529 Noninfective gastroenteritis and colitis, unspecified: Secondary | ICD-10-CM | POA: Diagnosis not present

## 2017-08-28 DIAGNOSIS — I739 Peripheral vascular disease, unspecified: Secondary | ICD-10-CM | POA: Diagnosis not present

## 2017-08-28 DIAGNOSIS — K219 Gastro-esophageal reflux disease without esophagitis: Secondary | ICD-10-CM | POA: Diagnosis not present

## 2017-08-28 DIAGNOSIS — F329 Major depressive disorder, single episode, unspecified: Secondary | ICD-10-CM | POA: Diagnosis not present

## 2017-08-28 DIAGNOSIS — F0280 Dementia in other diseases classified elsewhere without behavioral disturbance: Secondary | ICD-10-CM | POA: Diagnosis not present

## 2017-09-01 DIAGNOSIS — M1990 Unspecified osteoarthritis, unspecified site: Secondary | ICD-10-CM | POA: Diagnosis not present

## 2017-09-01 DIAGNOSIS — I10 Essential (primary) hypertension: Secondary | ICD-10-CM | POA: Diagnosis not present

## 2017-09-01 DIAGNOSIS — I739 Peripheral vascular disease, unspecified: Secondary | ICD-10-CM | POA: Diagnosis not present

## 2017-09-01 DIAGNOSIS — K529 Noninfective gastroenteritis and colitis, unspecified: Secondary | ICD-10-CM | POA: Diagnosis not present

## 2017-09-01 DIAGNOSIS — E440 Moderate protein-calorie malnutrition: Secondary | ICD-10-CM | POA: Diagnosis not present

## 2017-09-01 DIAGNOSIS — G309 Alzheimer's disease, unspecified: Secondary | ICD-10-CM | POA: Diagnosis not present

## 2017-09-01 DIAGNOSIS — F329 Major depressive disorder, single episode, unspecified: Secondary | ICD-10-CM | POA: Diagnosis not present

## 2017-09-01 DIAGNOSIS — K219 Gastro-esophageal reflux disease without esophagitis: Secondary | ICD-10-CM | POA: Diagnosis not present

## 2017-09-01 DIAGNOSIS — F0280 Dementia in other diseases classified elsewhere without behavioral disturbance: Secondary | ICD-10-CM | POA: Diagnosis not present

## 2017-09-03 ENCOUNTER — Telehealth: Payer: Self-pay

## 2017-09-03 NOTE — Telephone Encounter (Signed)
VM left for patient to schedule visit with Palliative Care.  

## 2017-09-03 NOTE — Telephone Encounter (Signed)
Phone call placed to patient. Spoke with sister to schedule visit with Palliative Care. Requested visit on Tuesday 09/08/17 at 4pm

## 2017-09-05 DIAGNOSIS — F0280 Dementia in other diseases classified elsewhere without behavioral disturbance: Secondary | ICD-10-CM | POA: Diagnosis not present

## 2017-09-05 DIAGNOSIS — M1990 Unspecified osteoarthritis, unspecified site: Secondary | ICD-10-CM | POA: Diagnosis not present

## 2017-09-05 DIAGNOSIS — G309 Alzheimer's disease, unspecified: Secondary | ICD-10-CM | POA: Diagnosis not present

## 2017-09-05 DIAGNOSIS — K219 Gastro-esophageal reflux disease without esophagitis: Secondary | ICD-10-CM

## 2017-09-05 DIAGNOSIS — I739 Peripheral vascular disease, unspecified: Secondary | ICD-10-CM | POA: Diagnosis not present

## 2017-09-08 ENCOUNTER — Other Ambulatory Visit: Payer: Medicare HMO | Admitting: Internal Medicine

## 2017-09-08 ENCOUNTER — Encounter: Payer: Self-pay | Admitting: Internal Medicine

## 2017-09-08 DIAGNOSIS — F02818 Dementia in other diseases classified elsewhere, unspecified severity, with other behavioral disturbance: Secondary | ICD-10-CM

## 2017-09-08 DIAGNOSIS — F329 Major depressive disorder, single episode, unspecified: Secondary | ICD-10-CM | POA: Diagnosis not present

## 2017-09-08 DIAGNOSIS — R634 Abnormal weight loss: Secondary | ICD-10-CM

## 2017-09-08 DIAGNOSIS — F0281 Dementia in other diseases classified elsewhere with behavioral disturbance: Secondary | ICD-10-CM

## 2017-09-08 DIAGNOSIS — E440 Moderate protein-calorie malnutrition: Secondary | ICD-10-CM | POA: Diagnosis not present

## 2017-09-08 DIAGNOSIS — E039 Hypothyroidism, unspecified: Secondary | ICD-10-CM | POA: Diagnosis not present

## 2017-09-08 DIAGNOSIS — Z7189 Other specified counseling: Secondary | ICD-10-CM

## 2017-09-08 DIAGNOSIS — G309 Alzheimer's disease, unspecified: Secondary | ICD-10-CM | POA: Diagnosis not present

## 2017-09-08 DIAGNOSIS — K219 Gastro-esophageal reflux disease without esophagitis: Secondary | ICD-10-CM | POA: Diagnosis not present

## 2017-09-08 DIAGNOSIS — I10 Essential (primary) hypertension: Secondary | ICD-10-CM | POA: Diagnosis not present

## 2017-09-08 DIAGNOSIS — M1990 Unspecified osteoarthritis, unspecified site: Secondary | ICD-10-CM | POA: Diagnosis not present

## 2017-09-08 DIAGNOSIS — I739 Peripheral vascular disease, unspecified: Secondary | ICD-10-CM | POA: Diagnosis not present

## 2017-09-08 DIAGNOSIS — F0280 Dementia in other diseases classified elsewhere without behavioral disturbance: Secondary | ICD-10-CM | POA: Diagnosis not present

## 2017-09-08 DIAGNOSIS — G308 Other Alzheimer's disease: Principal | ICD-10-CM

## 2017-09-08 NOTE — Progress Notes (Signed)
PALLIATIVE CARE CONSULT VISIT   PATIENT NAME: [name] DOB: February 20, 1931 MRN: [id number]  PRIMARY CARE PROVIDER:   [name], NP  REFERRING PROVIDER:  [name], NP [place], [place] [address]  RESPONSIBLE PARTY:   [name] [phone], [name] ([phone] RECOMMENDATIONS and PLAN:  1.Alzheimer's Dementia with mild behavioral disturbances (FAST 6B). Onset cognitive changes about 5 years ago; gradually progressive. Needs assist with showers, able to toilet independently. Episodically incontinent of urine. Gait instability (with turning quickly or when takin steps back); averaging 5-6 falls/month, with falls seeming to occur in clusters. Past injuries related to falls include bilateral broken wrists and skin forearm tears. She has a walker, not often utilized. Oriented to person and place, but poor memory/ forgetful (discussed her deceased son as if still alive). Family report change in baseline personality (more grumpy / irritable / easily agitated). She is able to do laundry but is not able to follow multi step instructions. PT following. She wanders about home and out to garage / yard, but doesn't leave the property. She is able to feed herself. a. Ongoing Alzheimer's education regarding coping stratigies, Pharmacological and non-pharmacological management of associated behavioral disturbances.  2. Unintentional weight loss, possibly r/t wasting associated with dementia. Recent BMP, CBC and TSH check by PCP overall WNL.  Weight 132 lbs (08/12/17), loss of 10 lbs (7% of body mass) over the last 4 weeks. Ht 5'5", BMI 22.66 kg/m2. Daughters report progressive loss of appetite and weight. Eating bites to 25% of meals. Past trial of by PCP Remeron and megace unhelpful. On ensure supplements.   3. Advanced Care Planning: Patient is DNR, but daughters [name] and [name]) unable to locate form. I completed and left 2 copies in the home. MOST form  discussed; educational material reviewed and left in the home. They wish to discuss the specifics among themselves, before signing. We did touch upon hospice; Patient may be a candidate if she shows further functional decline.  4. Follow up: Daughters will call our office to schedule f/u NP visit when [name]'s schedule is known. They wish to complete MOST form at that time.  5. Care Management: Daughter [name] lives in patient's home. She is chair bound due to obesity and decreased strength LEs. Daughter [name] very involved and visits most evenings. Family private pays a sitter q am M-F 10:30am-12:30pm, and Weekends 10am-2pm (assists with breakfast/personal care/medication administration). Family/friends/church members ([place]) take turns every evening to help out. Raised commode, grip bars, and shower chair in place.  6. PAIN: Back, knee, and occasional calf pain thought r/t arthritis, well managed with Tylenol prn. Daughter watches for non-verbal cues (grimaces/wincing).  7. Constipation: Managed with prn MOM.  I spent 105 minutes providing this consultation, from 3:30pm to 4:45 More than 50% of the time in this consultation was spent coordinating communication.   HISTORY OF PRESENT ILLNESS:  [name] is an 84 yo female with h/o dementia, HTN, PVD, and GERD. She is s/p ER evaluation early June for left facial pain (treated for possible shingles), and hospital admission mid-May for BRBPR (self-limiting; cipro for possible colitis seen on CT). Patient has been experiencing progressive weakness, decreased appetite and weight loss. Palliative Care was asked to help address goals of care; monitor for progression in dementia, help identify community resources, and provide family education / support.  CODE STATUS: DNR   PPS: 40% HOSPICE ELIGIBILITY/DIAGNOSIS: TBD  PAST MEDICAL HISTORY:  Past Medical History:  Diagnosis Date  . Abnormality of gait   . Alzheimer's disease   . Calculus of  kidney   . Closed fracture of seventh cervical vertebra without mention of spinal cord injury   . Depressive disorder, not elsewhere classified   . Edema   . Edema   . External hemorrhoids without mention of complication   . Gastric ulcer, unspecified as acute or chronic, without mention of hemorrhage, perforation, or obstruction   . GERD (gastroesophageal reflux disease)   . Hypertension   . Insomnia, unspecified   . Loss of weight   . Osteoarthrosis, unspecified whether generalized or localized, unspecified site   . Other protein-calorie malnutrition   . Pain in thoracic spine   . Personal history of fall   . Unspecified constipation   . Unspecified hereditary and idiopathic peripheral neuropathy   . Unspecified hypothyroidism   . Unspecified vitamin D deficiency   . Urinary frequency     SOCIAL HX:  Social History   Tobacco Use  . Smoking status: Never Smoker  . Smokeless tobacco: Never Used  Substance Use Topics  . Alcohol use: No   Patient resides in the home where she raised her 3 children. She was a homemaker.  Her son died 7 years ago. Widowed x 8 yrs. ALLERGIES: No Known Allergies   PERTINENT MEDICATIONS:  Outpatient Encounter Medications as of 09/08/2017  Medication Sig  . acetaminophen (TYLENOL) 325 MG tablet Take 2 tablets (650 mg total) by mouth every 6 (six) hours as needed.  Marland Kitchen amLODipine (NORVASC) 5 MG tablet Take 1 tablet (5 mg total) by mouth daily.  Marland Kitchen buPROPion (WELLBUTRIN XL) 150 MG 24 hr tablet Take 1 tablet (150 mg total) by mouth daily.  Marland Kitchen levothyroxine (SYNTHROID, LEVOTHROID) 88 MCG tablet TAKE ONE TABLET  ONCE DAILY 30 MINUTES BEFORE BREAKFAST FOR THYROID  . NAMZARIC 28-10 MG CP24 TAKE 1 CAPSULE AT BEDTIME. TO STABILIZE MEMORY  . potassium chloride (K-DUR) 10 MEQ tablet TAKE 1 TABLET(10 MEQ) BY MOUTH DAILY  . potassium chloride (K-DUR) 10 MEQ tablet Take 1 tablet (10 mEq total) by mouth daily.  Marland Kitchen senna-docusate (SENNA S) 8.6-50 MG tablet Take 1 tablet  by mouth at bedtime.  Marland Kitchen venlafaxine XR (EFFEXOR-XR) 75 MG 24 hr capsule TAKE 1 CAPSULE  EVERY DAY WITH BREAKFAST  . Vitamin D, Ergocalciferol, (DRISDOL) 50000 units CAPS capsule Take one capsule by mouth every 14 days   No facility-administered encounter medications on file as of 09/08/2017.     PHYSICAL EXAM:   General: NAD, frail appearing, thin. Pleasantly conversant and engaging. Follows 1 step commands. Cardiovascular: regular rate and rhythm Pulmonary: clear ant fields Abdomen: soft, nontender, + bowel sounds GU: no suprapubic tenderness Extremities: no edema, no joint deformities Skin: no rashes Neurological: Weakness but otherwise nonfocal. A & O to person/place.  Julianne Handler, NP

## 2017-09-10 DIAGNOSIS — G309 Alzheimer's disease, unspecified: Secondary | ICD-10-CM | POA: Diagnosis not present

## 2017-09-10 DIAGNOSIS — K219 Gastro-esophageal reflux disease without esophagitis: Secondary | ICD-10-CM | POA: Diagnosis not present

## 2017-09-10 DIAGNOSIS — I10 Essential (primary) hypertension: Secondary | ICD-10-CM | POA: Diagnosis not present

## 2017-09-10 DIAGNOSIS — F0280 Dementia in other diseases classified elsewhere without behavioral disturbance: Secondary | ICD-10-CM | POA: Diagnosis not present

## 2017-09-10 DIAGNOSIS — F329 Major depressive disorder, single episode, unspecified: Secondary | ICD-10-CM | POA: Diagnosis not present

## 2017-09-10 DIAGNOSIS — E440 Moderate protein-calorie malnutrition: Secondary | ICD-10-CM | POA: Diagnosis not present

## 2017-09-10 DIAGNOSIS — E039 Hypothyroidism, unspecified: Secondary | ICD-10-CM | POA: Diagnosis not present

## 2017-09-10 DIAGNOSIS — I739 Peripheral vascular disease, unspecified: Secondary | ICD-10-CM | POA: Diagnosis not present

## 2017-09-10 DIAGNOSIS — M1990 Unspecified osteoarthritis, unspecified site: Secondary | ICD-10-CM | POA: Diagnosis not present

## 2017-09-14 DIAGNOSIS — F0280 Dementia in other diseases classified elsewhere without behavioral disturbance: Secondary | ICD-10-CM | POA: Diagnosis not present

## 2017-09-14 DIAGNOSIS — I10 Essential (primary) hypertension: Secondary | ICD-10-CM | POA: Diagnosis not present

## 2017-09-14 DIAGNOSIS — G309 Alzheimer's disease, unspecified: Secondary | ICD-10-CM | POA: Diagnosis not present

## 2017-09-14 DIAGNOSIS — E440 Moderate protein-calorie malnutrition: Secondary | ICD-10-CM | POA: Diagnosis not present

## 2017-09-14 DIAGNOSIS — E039 Hypothyroidism, unspecified: Secondary | ICD-10-CM | POA: Diagnosis not present

## 2017-09-14 DIAGNOSIS — M1990 Unspecified osteoarthritis, unspecified site: Secondary | ICD-10-CM | POA: Diagnosis not present

## 2017-09-14 DIAGNOSIS — K219 Gastro-esophageal reflux disease without esophagitis: Secondary | ICD-10-CM | POA: Diagnosis not present

## 2017-09-14 DIAGNOSIS — F329 Major depressive disorder, single episode, unspecified: Secondary | ICD-10-CM | POA: Diagnosis not present

## 2017-09-14 DIAGNOSIS — I739 Peripheral vascular disease, unspecified: Secondary | ICD-10-CM | POA: Diagnosis not present

## 2017-09-18 DIAGNOSIS — G309 Alzheimer's disease, unspecified: Secondary | ICD-10-CM | POA: Diagnosis not present

## 2017-09-18 DIAGNOSIS — K219 Gastro-esophageal reflux disease without esophagitis: Secondary | ICD-10-CM | POA: Diagnosis not present

## 2017-09-18 DIAGNOSIS — M1990 Unspecified osteoarthritis, unspecified site: Secondary | ICD-10-CM | POA: Diagnosis not present

## 2017-09-18 DIAGNOSIS — I10 Essential (primary) hypertension: Secondary | ICD-10-CM | POA: Diagnosis not present

## 2017-09-18 DIAGNOSIS — F329 Major depressive disorder, single episode, unspecified: Secondary | ICD-10-CM | POA: Diagnosis not present

## 2017-09-18 DIAGNOSIS — E039 Hypothyroidism, unspecified: Secondary | ICD-10-CM | POA: Diagnosis not present

## 2017-09-18 DIAGNOSIS — F0280 Dementia in other diseases classified elsewhere without behavioral disturbance: Secondary | ICD-10-CM | POA: Diagnosis not present

## 2017-09-18 DIAGNOSIS — I739 Peripheral vascular disease, unspecified: Secondary | ICD-10-CM | POA: Diagnosis not present

## 2017-09-18 DIAGNOSIS — E440 Moderate protein-calorie malnutrition: Secondary | ICD-10-CM | POA: Diagnosis not present

## 2017-09-21 DIAGNOSIS — I10 Essential (primary) hypertension: Secondary | ICD-10-CM | POA: Diagnosis not present

## 2017-09-21 DIAGNOSIS — G309 Alzheimer's disease, unspecified: Secondary | ICD-10-CM | POA: Diagnosis not present

## 2017-09-21 DIAGNOSIS — F329 Major depressive disorder, single episode, unspecified: Secondary | ICD-10-CM | POA: Diagnosis not present

## 2017-09-21 DIAGNOSIS — K219 Gastro-esophageal reflux disease without esophagitis: Secondary | ICD-10-CM | POA: Diagnosis not present

## 2017-09-21 DIAGNOSIS — F0280 Dementia in other diseases classified elsewhere without behavioral disturbance: Secondary | ICD-10-CM | POA: Diagnosis not present

## 2017-09-21 DIAGNOSIS — M1990 Unspecified osteoarthritis, unspecified site: Secondary | ICD-10-CM | POA: Diagnosis not present

## 2017-09-21 DIAGNOSIS — E039 Hypothyroidism, unspecified: Secondary | ICD-10-CM | POA: Diagnosis not present

## 2017-09-21 DIAGNOSIS — I739 Peripheral vascular disease, unspecified: Secondary | ICD-10-CM | POA: Diagnosis not present

## 2017-09-21 DIAGNOSIS — E440 Moderate protein-calorie malnutrition: Secondary | ICD-10-CM | POA: Diagnosis not present

## 2017-09-24 DIAGNOSIS — I10 Essential (primary) hypertension: Secondary | ICD-10-CM | POA: Diagnosis not present

## 2017-09-24 DIAGNOSIS — M1990 Unspecified osteoarthritis, unspecified site: Secondary | ICD-10-CM | POA: Diagnosis not present

## 2017-09-24 DIAGNOSIS — E039 Hypothyroidism, unspecified: Secondary | ICD-10-CM | POA: Diagnosis not present

## 2017-09-24 DIAGNOSIS — K219 Gastro-esophageal reflux disease without esophagitis: Secondary | ICD-10-CM | POA: Diagnosis not present

## 2017-09-24 DIAGNOSIS — E440 Moderate protein-calorie malnutrition: Secondary | ICD-10-CM | POA: Diagnosis not present

## 2017-09-24 DIAGNOSIS — F329 Major depressive disorder, single episode, unspecified: Secondary | ICD-10-CM | POA: Diagnosis not present

## 2017-09-24 DIAGNOSIS — G309 Alzheimer's disease, unspecified: Secondary | ICD-10-CM | POA: Diagnosis not present

## 2017-09-24 DIAGNOSIS — F0280 Dementia in other diseases classified elsewhere without behavioral disturbance: Secondary | ICD-10-CM | POA: Diagnosis not present

## 2017-09-24 DIAGNOSIS — I739 Peripheral vascular disease, unspecified: Secondary | ICD-10-CM | POA: Diagnosis not present

## 2017-09-28 DIAGNOSIS — M1990 Unspecified osteoarthritis, unspecified site: Secondary | ICD-10-CM | POA: Diagnosis not present

## 2017-09-28 DIAGNOSIS — F329 Major depressive disorder, single episode, unspecified: Secondary | ICD-10-CM | POA: Diagnosis not present

## 2017-09-28 DIAGNOSIS — I10 Essential (primary) hypertension: Secondary | ICD-10-CM | POA: Diagnosis not present

## 2017-09-28 DIAGNOSIS — E440 Moderate protein-calorie malnutrition: Secondary | ICD-10-CM | POA: Diagnosis not present

## 2017-09-28 DIAGNOSIS — F0280 Dementia in other diseases classified elsewhere without behavioral disturbance: Secondary | ICD-10-CM | POA: Diagnosis not present

## 2017-09-28 DIAGNOSIS — E039 Hypothyroidism, unspecified: Secondary | ICD-10-CM | POA: Diagnosis not present

## 2017-09-28 DIAGNOSIS — G309 Alzheimer's disease, unspecified: Secondary | ICD-10-CM | POA: Diagnosis not present

## 2017-09-28 DIAGNOSIS — I739 Peripheral vascular disease, unspecified: Secondary | ICD-10-CM | POA: Diagnosis not present

## 2017-09-28 DIAGNOSIS — K219 Gastro-esophageal reflux disease without esophagitis: Secondary | ICD-10-CM | POA: Diagnosis not present

## 2017-10-01 DIAGNOSIS — I10 Essential (primary) hypertension: Secondary | ICD-10-CM | POA: Diagnosis not present

## 2017-10-01 DIAGNOSIS — E440 Moderate protein-calorie malnutrition: Secondary | ICD-10-CM | POA: Diagnosis not present

## 2017-10-01 DIAGNOSIS — G309 Alzheimer's disease, unspecified: Secondary | ICD-10-CM | POA: Diagnosis not present

## 2017-10-01 DIAGNOSIS — F0280 Dementia in other diseases classified elsewhere without behavioral disturbance: Secondary | ICD-10-CM | POA: Diagnosis not present

## 2017-10-01 DIAGNOSIS — F329 Major depressive disorder, single episode, unspecified: Secondary | ICD-10-CM | POA: Diagnosis not present

## 2017-10-01 DIAGNOSIS — K219 Gastro-esophageal reflux disease without esophagitis: Secondary | ICD-10-CM | POA: Diagnosis not present

## 2017-10-01 DIAGNOSIS — I739 Peripheral vascular disease, unspecified: Secondary | ICD-10-CM | POA: Diagnosis not present

## 2017-10-01 DIAGNOSIS — E039 Hypothyroidism, unspecified: Secondary | ICD-10-CM | POA: Diagnosis not present

## 2017-10-01 DIAGNOSIS — M1990 Unspecified osteoarthritis, unspecified site: Secondary | ICD-10-CM | POA: Diagnosis not present

## 2017-10-02 ENCOUNTER — Other Ambulatory Visit: Payer: Self-pay | Admitting: Nurse Practitioner

## 2017-10-02 DIAGNOSIS — F332 Major depressive disorder, recurrent severe without psychotic features: Secondary | ICD-10-CM

## 2017-11-12 ENCOUNTER — Other Ambulatory Visit: Payer: Self-pay | Admitting: Nurse Practitioner

## 2017-11-12 DIAGNOSIS — F332 Major depressive disorder, recurrent severe without psychotic features: Secondary | ICD-10-CM

## 2017-12-03 ENCOUNTER — Other Ambulatory Visit: Payer: Medicare HMO | Admitting: Primary Care

## 2017-12-03 DIAGNOSIS — G308 Other Alzheimer's disease: Secondary | ICD-10-CM

## 2017-12-03 DIAGNOSIS — Z515 Encounter for palliative care: Secondary | ICD-10-CM

## 2017-12-03 DIAGNOSIS — I739 Peripheral vascular disease, unspecified: Secondary | ICD-10-CM

## 2017-12-03 DIAGNOSIS — F02818 Dementia in other diseases classified elsewhere, unspecified severity, with other behavioral disturbance: Secondary | ICD-10-CM

## 2017-12-03 DIAGNOSIS — R296 Repeated falls: Secondary | ICD-10-CM

## 2017-12-03 DIAGNOSIS — F0281 Dementia in other diseases classified elsewhere with behavioral disturbance: Secondary | ICD-10-CM

## 2017-12-03 NOTE — Progress Notes (Signed)
PALLIATIVE CARE CONSULT VISIT   PATIENT NAME: [name] DOB: 10/14/1931 MRN: [id number]  PRIMARY CARE PROVIDER:   [name], NP  REFERRING PROVIDER:  [name], NP [place], [place] [address]  RESPONSIBLE PARTY:    Daughter in home,  [name],[name] Daughter [phone]     ASSESSMENT:     1. Dementia: Patient is dressed with a bathrobe over street clothes which she calls a jacket. She is relaxed in a chair having had lunch. States she can make herself light snacks. Weight unavailable.  Patient manifests behaviors consistent with FAST 6 a-c per daughter's report and observation. Daughter is disabled but in home 24 /7 and provides supervision for patient.  2. Fall/mobility: patient frequently falls and has hit her head several times recently. No change in LOC, no hematomas noted. Patient states she has a rollator but does not like to use it. Home is clear of clutter with open pathways. The bathroom is however small and would not allow navigation with the rollator. Falls occur from sitting to standing and when turning around, sometimes several per day.  3. Goals of Care: Patient states she is comfortable and happy in her home.  She has no concerns at this time. Daughter states they acknowledge falls are not a good development but that patient has not been hurt and can get herself up off the floor. We discussed eventuality of injury and possible scenarios.   RECOMMENDATIONS and PLAN:  1. Dementia: Provide education about disease progression in order to plan care. Daughter states they have a helper in the mornings and evenings for food, medications. Continue with this home assistance plan.  2. Falls/ mobility: Encouraged patient to rise slowly, turn slowly and use the walker for stability. Patient often forgets these measures. Her daughter also provides reminders. Instructed to look at garments that have padding for hip protection. Eduction provided about  danger of head injury and possible sequelae.  3. Goals of care: Daughter verbalizes understanding of hospice program as her father had had hospice care. MOST form not completed today. Follow in 3-4 months for GOC clarification.  Continue to support home plan of care.  I spent 25 minutes providing this consultation,  from 1300  to 1325. More than 50% of the time in this consultation was spent coordinating communication.   HISTORY OF PRESENT ILLNESS:  MCKENZIE Ortiz is a 84 y.o. year old female with multiple medical problems including Alzhieme's dementia, frequent falls, PVD, h/o fractures, constipation. Lives at home with care of adult daughters. Palliative Care was asked to help address goals of care.   CODE STATUS: DNR PPS: 50% HOSPICE ELIGIBILITY/DIAGNOSIS: TBD  PAST MEDICAL HISTORY:  Past Medical History:  Diagnosis Date  . Abnormality of gait   . Alzheimer's disease   . Calculus of kidney   . Closed fracture of seventh cervical vertebra without mention of spinal cord injury   . Depressive disorder, not elsewhere classified   . Edema   . Edema   . External hemorrhoids without mention of complication   . Gastric ulcer, unspecified as acute or chronic, without mention of hemorrhage, perforation, or obstruction   . GERD (gastroesophageal reflux disease)   . Hypertension   . Insomnia, unspecified   . Loss of weight   . Osteoarthrosis, unspecified whether generalized or localized, unspecified site   . Other protein-calorie malnutrition   . Pain in thoracic spine   . Personal history of fall   .  Unspecified constipation   . Unspecified hereditary and idiopathic peripheral neuropathy   . Unspecified hypothyroidism   . Unspecified vitamin D deficiency   . Urinary frequency     SOCIAL HX:  Social History   Tobacco Use  . Smoking status: Never Smoker  . Smokeless tobacco: Never Used  Substance Use Topics  . Alcohol use: No    ALLERGIES: No Known Allergies   PERTINENT  MEDICATIONS:  Outpatient Encounter Medications as of 12/03/2017  Medication Sig  . acetaminophen (TYLENOL) 325 MG tablet Take 2 tablets (650 mg total) by mouth every 6 (six) hours as needed.  Marland Kitchen amLODipine (NORVASC) 5 MG tablet Take 1 tablet (5 mg total) by mouth daily.  Marland Kitchen buPROPion (WELLBUTRIN XL) 150 MG 24 hr tablet TAKE 1 TABLET(150 MG) BY MOUTH DAILY  . levothyroxine (SYNTHROID, LEVOTHROID) 88 MCG tablet TAKE ONE TABLET  ONCE DAILY 30 MINUTES BEFORE BREAKFAST FOR THYROID  . NAMZARIC 28-10 MG CP24 TAKE 1 CAPSULE AT BEDTIME. TO STABILIZE MEMORY  . potassium chloride (K-DUR) 10 MEQ tablet TAKE 1 TABLET(10 MEQ) BY MOUTH DAILY  . potassium chloride (K-DUR) 10 MEQ tablet Take 1 tablet (10 mEq total) by mouth daily.  Marland Kitchen senna-docusate (SENNA S) 8.6-50 MG tablet Take 1 tablet by mouth at bedtime.  Marland Kitchen venlafaxine XR (EFFEXOR-XR) 75 MG 24 hr capsule TAKE 1 CAPSULE EVERY DAY WITH BREAKFAST  . Vitamin D, Ergocalciferol, (DRISDOL) 50000 units CAPS capsule Take one capsule by mouth every 14 days   No facility-administered encounter medications on file as of 12/03/2017.     PHYSICAL EXAM:   VS 98.0 108/63-91-18 95% weight unavailable General: NAD, frail appearing, thin. Pleasant and conversant. FAST 6a-c Cardiovascular: S1 S2, regular rate and rhythm Pulmonary: clear pulmonary fields, no cough, DOE, SOB Abdomen: distended, nontender, + bowel sounds GU: no suprapubic tenderness, family reports use of incontinence pads Extremities: no edema, no joint deformities. Feet without lesions. Skin: no rashes, lesions noted on gross exam. Neurological: Weakness, loss of balance with sudden position changes.  Cyndia Skeeters AGPCNP-BC

## 2018-02-04 ENCOUNTER — Encounter: Payer: Self-pay | Admitting: Nurse Practitioner

## 2018-02-22 ENCOUNTER — Other Ambulatory Visit: Payer: Medicare HMO | Admitting: Primary Care

## 2018-02-23 ENCOUNTER — Other Ambulatory Visit: Payer: Medicare HMO | Admitting: Primary Care

## 2018-02-23 DIAGNOSIS — R296 Repeated falls: Secondary | ICD-10-CM

## 2018-02-23 DIAGNOSIS — F0281 Dementia in other diseases classified elsewhere with behavioral disturbance: Secondary | ICD-10-CM | POA: Diagnosis not present

## 2018-02-23 DIAGNOSIS — G308 Other Alzheimer's disease: Secondary | ICD-10-CM | POA: Diagnosis not present

## 2018-02-23 DIAGNOSIS — F02818 Dementia in other diseases classified elsewhere, unspecified severity, with other behavioral disturbance: Secondary | ICD-10-CM

## 2018-02-23 DIAGNOSIS — Z515 Encounter for palliative care: Secondary | ICD-10-CM

## 2018-02-23 NOTE — Progress Notes (Signed)
[facility] Telephone: [phone] Fax: [phone]  PATIENT NAME: [name] DOB: Apr 26, 1931 MRN: [id number]  PRIMARY CARE PROVIDER:   [name], NP  REFERRING PROVIDER:  [name], NP [name], [name] [id number]  RESPONSIBLE PARTY:   Extended Emergency Contact Information Primary Emergency Contact: [name] Address: [address]          [place], [place] [address] [place] Phone: [phone] Relation: Daughter Secondary Emergency Contact: [name] Address: [address]          [place], [place] [address] [place] Phone: [phone] Relation: Daughter  Patient seen today by palliative care on referral from Gabrielle Mustache, NP.           ASSESSMENT and RECOMMENDATIONS:   1. Frequent Falls: Patient and daughter state frequent falls and recent hematoma due to running into the door frame. States she does not use walker and holds to the wall for stability. Has a rollator but cannot be persuaded to use it. Fortunately does not take anti coagulation medication.  Suggest Geri sleeves, hip pads, for some protection. Education provided about danger of head injury in elderly population. Daughter states they have cameras in home to monitor falls as she is chair bound in the den.   2. Dementia: FAST score 6 a, able to ambulate and get food in kitchen. Goes to bathroom alone. Has a home aide for adls in the AM. Has frequent falls posing safety issue. Daughter is in home but chair bound.  3. Goals of Care: MOST form given to daughter [name] who will discuss advance directives with her sister. States they have not discussed advance directives yet. Will f/u next visit.   Palliative will continue to follow for goals of care clarification, safety teaching. Return 3 months.  I spent 25 minutes providing this consultation,  from 1300 to 1325. More than 50% of the time in this consultation was spent  coordinating communication.   HISTORY OF PRESENT ILLNESS:  [name] is a 85 y.o. year old female with multiple medical problems including Alzhieme's dementia, frequent falls, PVD, h/o fractures, constipation. Palliative Care was asked to help address goals of care.   CODE STATUS:  Full, MOST form given to daughter who will discuss with sister.  PPS: 40% HOSPICE ELIGIBILITY/DIAGNOSIS: no  PAST MEDICAL HISTORY:  Past Medical History:  Diagnosis Date  . Abnormality of gait   . Alzheimer's disease   . Calculus of kidney   . Closed fracture of seventh cervical vertebra without mention of spinal cord injury   . Depressive disorder, not elsewhere classified   . Edema   . Edema   . External hemorrhoids without mention of complication   . Gastric ulcer, unspecified as acute or chronic, without mention of hemorrhage, perforation, or obstruction   . GERD (gastroesophageal reflux disease)   . Hypertension   . Insomnia, unspecified   . Loss of weight   . Osteoarthrosis, unspecified whether generalized or localized, unspecified site   . Other protein-calorie malnutrition   . Pain in thoracic spine   . Personal history of fall   . Unspecified constipation   . Unspecified hereditary and idiopathic peripheral neuropathy   . Unspecified hypothyroidism   . Unspecified vitamin D deficiency   . Urinary frequency     SOCIAL HX:  Social History   Tobacco Use  . Smoking status: Never Smoker  . Smokeless tobacco: Never Used  Substance Use  Topics  . Alcohol use: No    ALLERGIES: No Known Allergies   PERTINENT MEDICATIONS:  Outpatient Encounter Medications as of 02/23/2018  Medication Sig  . acetaminophen (TYLENOL) 325 MG tablet Take 2 tablets (650 mg total) by mouth every 6 (six) hours as needed.  Marland Kitchen amLODipine (NORVASC) 5 MG tablet Take 1 tablet (5 mg total) by mouth daily.  Marland Kitchen buPROPion (WELLBUTRIN XL) 150 MG 24 hr tablet TAKE 1 TABLET(150 MG) BY MOUTH DAILY  . levothyroxine (SYNTHROID,  LEVOTHROID) 88 MCG tablet TAKE ONE TABLET  ONCE DAILY 30 MINUTES BEFORE BREAKFAST FOR THYROID  . NAMZARIC 28-10 MG CP24 TAKE 1 CAPSULE AT BEDTIME. TO STABILIZE MEMORY  . potassium chloride (K-DUR) 10 MEQ tablet TAKE 1 TABLET(10 MEQ) BY MOUTH DAILY  . potassium chloride (K-DUR) 10 MEQ tablet Take 1 tablet (10 mEq total) by mouth daily.  Marland Kitchen senna-docusate (SENNA S) 8.6-50 MG tablet Take 1 tablet by mouth at bedtime.  Marland Kitchen venlafaxine XR (EFFEXOR-XR) 75 MG 24 hr capsule TAKE 1 CAPSULE EVERY DAY WITH BREAKFAST  . Vitamin D, Ergocalciferol, (DRISDOL) 50000 units CAPS capsule Take one capsule by mouth every 14 days   No facility-administered encounter medications on file as of 02/23/2018.     PHYSICAL EXAM:  VS 137/76  98.1-84-18; 23 cm arm circ 96% oxygen room air,   General: NAD, frail appearing, thin, very frequent falls. Alert and oriented x 2, poor historian Cardiovascular: regular rate and rhythm, S1S2 Pulmonary: clear all fields, no cough Abdomen: soft, nontender, + bowel sounds, denies constipation GU: no dysuria Extremities: no edema, no joint deformities, no lesions Skin: no rashes, no wounds, 5x3 cm hematoma left arm, with adjacent hematomas, fell into a wall. Neurological: Weakness but otherwise nonfocal  Gabrielle Skeeters DNP, AGPCNP-BC

## 2018-03-08 ENCOUNTER — Encounter: Payer: Self-pay | Admitting: Family

## 2018-03-08 ENCOUNTER — Ambulatory Visit: Payer: Self-pay

## 2018-03-11 ENCOUNTER — Encounter: Payer: Self-pay | Admitting: Family

## 2018-03-11 ENCOUNTER — Ambulatory Visit (INDEPENDENT_AMBULATORY_CARE_PROVIDER_SITE_OTHER): Payer: Medicare HMO | Admitting: Family

## 2018-03-11 VITALS — BP 130/80 | HR 76 | Temp 97.9°F | Resp 18 | Ht 64.0 in | Wt 134.4 lb

## 2018-03-11 DIAGNOSIS — F0281 Dementia in other diseases classified elsewhere with behavioral disturbance: Secondary | ICD-10-CM

## 2018-03-11 DIAGNOSIS — I10 Essential (primary) hypertension: Secondary | ICD-10-CM

## 2018-03-11 DIAGNOSIS — Z0000 Encounter for general adult medical examination without abnormal findings: Secondary | ICD-10-CM | POA: Diagnosis not present

## 2018-03-11 DIAGNOSIS — F332 Major depressive disorder, recurrent severe without psychotic features: Secondary | ICD-10-CM | POA: Diagnosis not present

## 2018-03-11 DIAGNOSIS — G301 Alzheimer's disease with late onset: Secondary | ICD-10-CM | POA: Diagnosis not present

## 2018-03-11 MED ORDER — BUPROPION HCL ER (XL) 150 MG PO TB24: ORAL_TABLET | ORAL | 1 refills | Status: DC

## 2018-03-11 MED ORDER — VITAMIN D (ERGOCALCIFEROL) 1.25 MG (50000 UNIT) PO CAPS: ORAL_CAPSULE | ORAL | 1 refills | Status: DC

## 2018-03-11 MED ORDER — LEVOTHYROXINE SODIUM 88 MCG PO TABS
ORAL_TABLET | ORAL | 1 refills | Status: DC
Start: 2018-03-11 — End: 2018-08-11

## 2018-03-11 MED ORDER — AMLODIPINE BESYLATE 5 MG PO TABS: 5.0000 mg | ORAL_TABLET | Freq: Every day | ORAL | 3 refills | Status: DC

## 2018-03-11 MED ORDER — VENLAFAXINE HCL ER 75 MG PO CP24
ORAL_CAPSULE | ORAL | 1 refills | Status: DC
Start: 2018-03-11 — End: 2018-09-07

## 2018-03-11 MED ORDER — MEMANTINE HCL-DONEPEZIL HCL ER 28-10 MG PO CP24: 1.0000 | ORAL_CAPSULE | Freq: Every day | ORAL | 1 refills | Status: DC

## 2018-03-11 NOTE — Patient Instructions (Signed)
[name] , Thank you for taking time to come for your Medicare Wellness Visit. I appreciate your ongoing commitment to your health goals. Please review the following plan we discussed and let me know if I can assist you in the future.   Screening recommendations/referrals: Colonoscopy: Up to date  Mammogram Up to date  Bone Density Up to date  Recommended yearly ophthalmology/optometry visit for glaucoma screening and checkup Recommended yearly dental visit for hygiene and checkup  Vaccinations: Influenza vaccine: Declined  Pneumococcal vaccine: Up to date  Tdap vaccine: Up to date due 03/11/2028 Shingles vaccine; Decline    Advanced directives:copy in chart   Conditions/risks identified: Age > 51 yrs,hypertension and histroy of exposure to smoking.   Next appointment: 1 year   Preventive Care 42 Years and Older, Female Preventive care refers to lifestyle choices and visits with your health care provider that can promote health and wellness. What does preventive care include?  A yearly physical exam. This is also called an annual well check.  Dental exams once or twice a year.  Routine eye exams. Ask your health care provider how often you should have your eyes checked.  Personal lifestyle choices, including:  Daily care of your teeth and gums.  Regular physical activity.  Eating a healthy diet.  Avoiding tobacco and drug use.  Limiting alcohol use.  Practicing safe sex.  Taking low-dose aspirin every day.  Taking vitamin and mineral supplements as recommended by your health care provider. What happens during an annual well check? The services and screenings done by your health care provider during your annual well check will depend on your age, overall health, lifestyle risk factors, and family history of disease. Counseling  Your health care provider may ask you questions about your:  Alcohol use.  Tobacco use.  Drug use.  Emotional well-being.  Home  and relationship well-being.  Sexual activity.  Eating habits.  History of falls.  Memory and ability to understand (cognition).  Work and work Statistician.  Reproductive health. Screening  You may have the following tests or measurements:  Height, weight, and BMI.  Blood pressure.  Lipid and cholesterol levels. These may be checked every 5 years, or more frequently if you are over 87 years old.  Skin check.  Lung cancer screening. You may have this screening every year starting at age 9 if you have a 30-pack-year history of smoking and currently smoke or have quit within the past 15 years.  Fecal occult blood test (FOBT) of the stool. You may have this test every year starting at age 63.  Flexible sigmoidoscopy or colonoscopy. You may have a sigmoidoscopy every 5 years or a colonoscopy every 10 years starting at age 38.  Hepatitis C blood test.  Hepatitis B blood test.  Sexually transmitted disease (STD) testing.  Diabetes screening. This is done by checking your blood sugar (glucose) after you have not eaten for a while (fasting). You may have this done every 1-3 years.  Bone density scan. This is done to screen for osteoporosis. You may have this done starting at age 66.  Mammogram. This may be done every 1-2 years. Talk to your health care provider about how often you should have regular mammograms. Talk with your health care provider about your test results, treatment options, and if necessary, the need for more tests. Vaccines  Your health care provider may recommend certain vaccines, such as:  Influenza vaccine. This is recommended every year.  Tetanus, diphtheria, and acellular  pertussis (Tdap, Td) vaccine. You may need a Td booster every 10 years.  Zoster vaccine. You may need this after age 68.  Pneumococcal 13-valent conjugate (PCV13) vaccine. One dose is recommended after age 66.  Pneumococcal polysaccharide (PPSV23) vaccine. One dose is recommended  after age 61. Talk to your health care provider about which screenings and vaccines you need and how often you need them. This information is not intended to replace advice given to you by your health care provider. Make sure you discuss any questions you have with your health care provider. Document Released: 03/02/2015 Document Revised: 10/24/2015 Document Reviewed: 12/05/2014 Elsevier Interactive Patient Education  2017 [place] Prevention in the Home Falls can cause injuries. They can happen to people of all ages. There are many things you can do to make your home safe and to help prevent falls. What can I do on the outside of my home?  Regularly fix the edges of walkways and driveways and fix any cracks.  Remove anything that might make you trip as you walk through a door, such as a raised step or threshold.  Trim any bushes or trees on the path to your home.  Use bright outdoor lighting.  Clear any walking paths of anything that might make someone trip, such as rocks or tools.  Regularly check to see if handrails are loose or broken. Make sure that both sides of any steps have handrails.  Any raised decks and porches should have guardrails on the edges.  Have any leaves, snow, or ice cleared regularly.  Use sand or salt on walking paths during winter.  Clean up any spills in your garage right away. This includes oil or grease spills. What can I do in the bathroom?  Use night lights.  Install grab bars by the toilet and in the tub and shower. Do not use towel bars as grab bars.  Use non-skid mats or decals in the tub or shower.  If you need to sit down in the shower, use a plastic, non-slip stool.  Keep the floor dry. Clean up any water that spills on the floor as soon as it happens.  Remove soap buildup in the tub or shower regularly.  Attach bath mats securely with double-sided non-slip rug tape.  Do not have throw rugs and other things on the floor  that can make you trip. What can I do in the bedroom?  Use night lights.  Make sure that you have a light by your bed that is easy to reach.  Do not use any sheets or blankets that are too big for your bed. They should not hang down onto the floor.  Have a firm chair that has side arms. You can use this for support while you get dressed.  Do not have throw rugs and other things on the floor that can make you trip. What can I do in the kitchen?  Clean up any spills right away.  Avoid walking on wet floors.  Keep items that you use a lot in easy-to-reach places.  If you need to reach something above you, use a strong step stool that has a grab bar.  Keep electrical cords out of the way.  Do not use floor polish or wax that makes floors slippery. If you must use wax, use non-skid floor wax.  Do not have throw rugs and other things on the floor that can make you trip. What can I do with my stairs?  Do not leave any items on the stairs.  Make sure that there are handrails on both sides of the stairs and use them. Fix handrails that are broken or loose. Make sure that handrails are as long as the stairways.  Check any carpeting to make sure that it is firmly attached to the stairs. Fix any carpet that is loose or worn.  Avoid having throw rugs at the top or bottom of the stairs. If you do have throw rugs, attach them to the floor with carpet tape.  Make sure that you have a light switch at the top of the stairs and the bottom of the stairs. If you do not have them, ask someone to add them for you. What else can I do to help prevent falls?  Wear shoes that:  Do not have high heels.  Have rubber bottoms.  Are comfortable and fit you well.  Are closed at the toe. Do not wear sandals.  If you use a stepladder:  Make sure that it is fully opened. Do not climb a closed stepladder.  Make sure that both sides of the stepladder are locked into place.  Ask someone to hold it  for you, if possible.  Clearly mark and make sure that you can see:  Any grab bars or handrails.  First and last steps.  Where the edge of each step is.  Use tools that help you move around (mobility aids) if they are needed. These include:  Canes.  Walkers.  Scooters.  Crutches.  Turn on the lights when you go into a dark area. Replace any light bulbs as soon as they burn out.  Set up your furniture so you have a clear path. Avoid moving your furniture around.  If any of your floors are uneven, fix them.  If there are any pets around you, be aware of where they are.  Review your medicines with your doctor. Some medicines can make you feel dizzy. This can increase your chance of falling. Ask your doctor what other things that you can do to help prevent falls. This information is not intended to replace advice given to you by your health care provider. Make sure you discuss any questions you have with your health care provider. Document Released: 11/30/2008 Document Revised: 07/12/2015 Document Reviewed: 03/10/2014 Elsevier Interactive Patient Education  2017 [employer].

## 2018-03-11 NOTE — Progress Notes (Signed)
Subjective:   [name] is a 85 y.o. female who presents for Medicare Annual (Subsequent) preventive examination.  Review of Systems:  Cardiac Risk Factors include: advanced age (>73men, >83 women);hypertension;smoking/ tobacco exposure     Objective:     Vitals: BP 130/80   Pulse 76   Temp 97.9 F (36.6 C) (Oral)   Resp 18   Ht 5\' 4"  (1.626 m)   Wt 134 lb 6.4 oz (61 kg)   SpO2 98%   BMI 23.07 kg/m   Body mass index is 23.07 kg/m.  Advanced Directives 03/11/2018 08/12/2017 07/08/2017 06/30/2017 03/06/2017 12/10/2016 11/16/2016  Does Patient Have a Medical Advance Directive? Yes No Yes Yes Yes Yes No  Type of [employer] Power of [place];Living will [place];Living will;Out of facility DNR (pink MOST or yellow form) Healthcare Power of [place] of facility DNR (pink MOST or yellow form)  Does patient want to make changes to medical advance directive? No - Patient declined - - No - Patient declined No - Patient declined - No - Patient declined  Copy of [place] in Chart? Yes - validated most recent copy scanned in chart (See row information) - Yes No - copy requested No - copy requested Yes Yes  Would patient like information on creating a medical advance directive? - - - - - - -  Pre-existing out of facility DNR order (yellow form or pink MOST form) - - - - Yellow form placed in chart (order not valid for inpatient use) - Yellow form placed in chart (order not valid for inpatient use)    Tobacco Social History   Tobacco Use  Smoking Status Never Smoker  Smokeless Tobacco Never Used     Counseling given: Not Answered   Clinical Intake:  Pre-visit preparation completed: No  Pain : No/denies pain     BMI - recorded: 23.07 Nutritional Status: BMI of 19-24  Normal Nutritional Risks: None Diabetes: No  How often do you need to have someone help you  when you read instructions, pamphlets, or other written materials from your doctor or pharmacy?: 4 - Often What is the last grade level you completed in school?: 5 th grade   Interpreter Needed?: No  Information entered by :: Naiah Donahoe FNP-C  Past Medical History:  Diagnosis Date  . Abnormality of gait   . Alzheimer's disease ([place])   . Calculus of kidney   . Closed fracture of seventh cervical vertebra without mention of spinal cord injury   . Depressive disorder, not elsewhere classified   . Edema   . Edema   . External hemorrhoids without mention of complication   . Gastric ulcer, unspecified as acute or chronic, without mention of hemorrhage, perforation, or obstruction   . GERD (gastroesophageal reflux disease)   . Hypertension   . Insomnia, unspecified   . Loss of weight   . Osteoarthrosis, unspecified whether generalized or localized, unspecified site   . Other protein-calorie malnutrition   . Pain in thoracic spine   . Personal history of fall   . Unspecified constipation   . Unspecified hereditary and idiopathic peripheral neuropathy   . Unspecified hypothyroidism   . Unspecified vitamin D deficiency   . Urinary frequency    Past Surgical History:  Procedure Laterality Date  . TEE WITHOUT CARDIOVERSION N/A 11/13/2016   Procedure: TRANSESOPHAGEAL ECHOCARDIOGRAM (TEE);  Surgeon: Jerline Pain, MD;  Location: [facility];  Service: Cardiovascular;  Laterality: N/A;  . WRIST SURGERY Right 2013   Family History  Problem Relation Age of Onset  . Cancer Mother   . CVA Father   . Cancer Brother   . Alcohol abuse Sister    Social History   Socioeconomic History  . Marital status: Widowed    Spouse name: Not on file  . Number of children: Not on file  . Years of education: Not on file  . Highest education level: Not on file  Occupational History  . Not on file  Social Needs  . Financial resource strain: Not hard at all  . Food insecurity:    Worry: Never  true    Inability: Never true  . Transportation needs:    Medical: No    Non-medical: No  Tobacco Use  . Smoking status: Never Smoker  . Smokeless tobacco: Never Used  Substance and Sexual Activity  . Alcohol use: No  . Drug use: No  . Sexual activity: Never  Lifestyle  . Physical activity:    Days per week: 3 days    Minutes per session: 20 min  . Stress: Only a little  Relationships  . Social connections:    Talks on phone: More than three times a week    Gets together: More than three times a week    Attends religious service: Never    Active member of club or organization: No    Attends meetings of clubs or organizations: Never    Relationship status: Widowed  Other Topics Concern  . Not on file  Social History Narrative  . Not on file    Outpatient Encounter Medications as of 03/11/2018  Medication Sig  . acetaminophen (TYLENOL) 325 MG tablet Take 2 tablets (650 mg total) by mouth every 6 (six) hours as needed.  Marland Kitchen amLODipine (NORVASC) 5 MG tablet Take 1 tablet (5 mg total) by mouth daily.  Marland Kitchen buPROPion (WELLBUTRIN XL) 150 MG 24 hr tablet TAKE 1 TABLET(150 MG) BY MOUTH DAILY  . levothyroxine (SYNTHROID, LEVOTHROID) 88 MCG tablet TAKE ONE TABLET  ONCE DAILY 30 MINUTES BEFORE BREAKFAST FOR THYROID  . NAMZARIC 28-10 MG CP24 TAKE 1 CAPSULE AT BEDTIME. TO STABILIZE MEMORY  . venlafaxine XR (EFFEXOR-XR) 75 MG 24 hr capsule TAKE 1 CAPSULE EVERY DAY WITH BREAKFAST  . Vitamin D, Ergocalciferol, (DRISDOL) 50000 units CAPS capsule Take one capsule by mouth every 14 days  . [DISCONTINUED] potassium chloride (K-DUR) 10 MEQ tablet TAKE 1 TABLET(10 MEQ) BY MOUTH DAILY  . [DISCONTINUED] potassium chloride (K-DUR) 10 MEQ tablet Take 1 tablet (10 mEq total) by mouth daily.  . [DISCONTINUED] senna-docusate (SENNA S) 8.6-50 MG tablet Take 1 tablet by mouth at bedtime.   No facility-administered encounter medications on file as of 03/11/2018.     Activities of Daily Living In your present  state of health, do you have any difficulty performing the following activities: 03/11/2018 06/30/2017  Hearing? N N  Vision? N N  Difficulty concentrating or making decisions? Tempie Donning  Walking or climbing stairs? N Y  Dressing or bathing? Y Y  Doing errands, shopping? Tempie Donning  Comment needs assistance  -  Conservation officer, nature and eating ? Y -  Using the Toilet? N -  In the past six months, have you accidently leaked urine? Y -  Do you have problems with loss of bowel control? N -  Managing your Medications? Y -  Managing your Finances? N -  Housekeeping or managing your Housekeeping? Y -  Some recent data might be hidden    Patient Care Team: [name], NP as PCP - General (Geriatric Medicine) Estevan Oaks, NP as Nurse Practitioner [facility] and Palliative Medicine)    Assessment:   This is a routine wellness examination for Siniya.  Exercise Activities and Dietary recommendations Current Exercise Habits: The patient does not participate in regular exercise at present, Exercise limited by: Other - see comments(cognitive impairment )  Goals   None     Fall Risk Fall Risk  03/11/2018 08/12/2017 07/08/2017 04/27/2017 03/06/2017  Falls in the past year? 1 Yes Yes Yes Yes  Number falls in past yr: 0 2 or more 2 or more 2 or more 2 or more  Comment - - - - -  Injury with Fall? 0 No Yes - Yes  Comment - - - - -  Risk Factor Category  - - - - -  Risk for fall due to : - - - - -   Is the patient's home free of loose throw rugs in walkways, pet beds, electrical cords, etc? No         Grab bars in the bathroom? yes      Handrails on the stairs?   yes      Adequate lighting?   yes  Depression Screen PHQ 2/9 Scores 03/11/2018 03/06/2017 03/06/2017 12/10/2016  PHQ - 2 Score 0 0 0 0  PHQ- 9 Score - - - -     Cognitive Function MMSE - Mini Mental State Exam 03/11/2018 03/06/2017 11/27/2015 03/30/2014  Orientation to time 0 0 0 2  Orientation to Place 0 2 2 2   Registration 3 3 3 3    Attention/ Calculation 0 4 0 0  Recall 0 0 0 0  Language- name 2 objects 2 2 2 2   Language- repeat 1 1 1 1   Language- follow 3 step command 2 3 3 3   Language- read & follow direction 1 1 1 1   Write a sentence 1 1 1  0  Copy design 0 1 0 0  Total score 10 18 13 14     Immunization History  Administered Date(s) Administered  . Influenza, High Dose Seasonal PF 12/10/2016  . Influenza,inj,Quad PF,6+ Mos 11/27/2015  . PPD Test 01/22/2015  . Pneumococcal Conjugate-13 11/27/2015  . Pneumococcal Polysaccharide-23 03/06/2017    Qualifies for Shingles Vaccine? Decline   Screening Tests Health Maintenance  Topic Date Due  . INFLUENZA VACCINE  03/12/2019 (Originally 09/17/2017)  . TETANUS/TDAP  03/11/2028 (Originally 03/17/1950)  . DEXA SCAN  Completed  . PNA vac Low Risk Adult  Completed    Cancer Screenings: Lung: Low Dose CT Chest recommended if Age 109-80 years, 30 pack-year currently smoking OR have quit w/in 15years. Patient does not qualify. Breast:  Up to date on Mammogram? Yes   Up to date of Bone Density/Dexa? Yes Colorectal:Up to date   Additional Screenings:  Hepatitis C Screening: Declined      Plan:   None   I have personally reviewed and noted the following in the patient's chart:   . Medical and social history . Use of alcohol, tobacco or illicit drugs  . Current medications and supplements . Functional ability and status . Nutritional status . Physical activity . Advanced directives . List of other physicians . Hospitalizations, surgeries, and ER visits in previous 12 months . Vitals . Screenings to include cognitive, depression, and falls . Referrals and appointments  In  addition, I have reviewed and discussed with patient certain preventive protocols, quality metrics, and best practice recommendations. A written personalized care plan for preventive services as well as general preventive health recommendations were provided to patient.   Sandrea Hughs, NP  03/11/2018

## 2018-03-22 ENCOUNTER — Telehealth: Payer: Self-pay | Admitting: Primary Care

## 2018-03-22 NOTE — Telephone Encounter (Signed)
Daughter Sharyn Lull called this am and states patient got out of the house at 4 am  and walked several blocks to her late husband homeplace. She was looking for him wondering why he wasn't at home. Fortunately a family member still lived in the house and called the daughters. One daughter lives in the house with the patient but did not hear the patient leave. They are getting locks installed today so the patient will not be able to elope again. Daughter was very distressed and asked for a  nursing home stay. Informed this was not usually done unless it followed a qualifying hospitalization. She is not yet hospice eligible based on my assessment.  I encouraged the daughter to visit several facilities as she is contemplating placement and to discuss her financial questions.  Encouraged her to take patient to the ED for any troublesome symptoms or changes,  or if she worsens, or call the primary provider for an appointment. I will see later in the week at her home, if at baseline.

## 2018-03-23 NOTE — Telephone Encounter (Signed)
Thank you for the update!

## 2018-03-25 ENCOUNTER — Telehealth: Payer: Self-pay | Admitting: Primary Care

## 2018-03-25 ENCOUNTER — Ambulatory Visit: Payer: Medicare HMO | Admitting: Nurse Practitioner

## 2018-03-25 NOTE — Telephone Encounter (Signed)
T/c to home to set up time to visit per daughter's request. No answer, message left.

## 2018-03-26 ENCOUNTER — Ambulatory Visit
Admission: RE | Admit: 2018-03-26 | Discharge: 2018-03-26 | Disposition: A | Discharge status: Home / Self Care | Payer: Medicare HMO | Source: Ambulatory Visit | Attending: Family | Admitting: Family

## 2018-03-26 ENCOUNTER — Encounter: Payer: Self-pay | Admitting: Family

## 2018-03-26 ENCOUNTER — Ambulatory Visit (INDEPENDENT_AMBULATORY_CARE_PROVIDER_SITE_OTHER): Payer: Medicare HMO | Admitting: Family

## 2018-03-26 VITALS — BP 110/70 | HR 86 | Temp 97.4°F | Ht 64.0 in | Wt 134.2 lb

## 2018-03-26 DIAGNOSIS — R399 Unspecified symptoms and signs involving the genitourinary system: Secondary | ICD-10-CM | POA: Diagnosis not present

## 2018-03-26 DIAGNOSIS — S2020XA Contusion of thorax, unspecified, initial encounter: Secondary | ICD-10-CM

## 2018-03-26 DIAGNOSIS — Y92009 Unspecified place in unspecified non-institutional (private) residence as the place of occurrence of the external cause: Secondary | ICD-10-CM

## 2018-03-26 DIAGNOSIS — R296 Repeated falls: Secondary | ICD-10-CM | POA: Diagnosis not present

## 2018-03-26 DIAGNOSIS — R0781 Pleurodynia: Secondary | ICD-10-CM

## 2018-03-26 DIAGNOSIS — R410 Disorientation, unspecified: Secondary | ICD-10-CM

## 2018-03-26 DIAGNOSIS — W19XXXA Unspecified fall, initial encounter: Secondary | ICD-10-CM | POA: Diagnosis not present

## 2018-03-26 LAB — CBC WITH DIFFERENTIAL/PLATELET
Absolute Monocytes: 578 cells/uL (ref 200–950)
Basophils Absolute: 48 cells/uL (ref 0–200)
Basophils Relative: 0.7 %
Eosinophils Absolute: 129 cells/uL (ref 15–500)
Eosinophils Relative: 1.9 %
HCT: 42.1 % (ref 35.0–45.0)
Hemoglobin: 14.8 g/dL (ref 11.7–15.5)
Lymphs Abs: 1584 cells/uL (ref 850–3900)
MCH: 35.1 pg — ABNORMAL HIGH (ref 27.0–33.0)
MCHC: 35.2 g/dL (ref 32.0–36.0)
MCV: 99.8 fL (ref 80.0–100.0)
MPV: 10.1 fL (ref 7.5–12.5)
Monocytes Relative: 8.5 %
Neutro Abs: 4461 cells/uL (ref 1500–7800)
Neutrophils Relative %: 65.6 %
Platelets: 250 10*3/uL (ref 140–400)
RBC: 4.22 10*6/uL (ref 3.80–5.10)
RDW: 12.2 % (ref 11.0–15.0)
Total Lymphocyte: 23.3 %
WBC: 6.8 10*3/uL (ref 3.8–10.8)

## 2018-03-26 LAB — COMPLETE METABOLIC PANEL WITH GFR
AG Ratio: 1.6 (calc) (ref 1.0–2.5)
ALT: 17 U/L (ref 6–29)
AST: 19 U/L (ref 10–35)
Albumin: 4.3 g/dL (ref 3.6–5.1)
Alkaline phosphatase (APISO): 101 U/L (ref 37–153)
BUN/Creatinine Ratio: 17 (calc) (ref 6–22)
BUN: 18 mg/dL (ref 7–25)
CO2: 29 mmol/L (ref 20–32)
Calcium: 9.8 mg/dL (ref 8.6–10.4)
Chloride: 104 mmol/L (ref 98–110)
Creat: 1.08 mg/dL — ABNORMAL HIGH (ref 0.60–0.88)
GFR, Est African American: 54 mL/min/{1.73_m2} — ABNORMAL LOW (ref >=60)
GFR, Est Non African American: 47 mL/min/{1.73_m2} — ABNORMAL LOW (ref >=60)
Globulin: 2.7 g/dL (calc) (ref 1.9–3.7)
Glucose, Bld: 79 mg/dL (ref 65–139)
Potassium: 4.4 mmol/L (ref 3.5–5.3)
Sodium: 143 mmol/L (ref 135–146)
Total Bilirubin: 0.5 mg/dL (ref 0.2–1.2)
Total Protein: 7 g/dL (ref 6.1–8.1)

## 2018-03-26 NOTE — Progress Notes (Signed)
Provider: Dinah Ngetich FNP-C  [name], NP  Patient Care Team: [name], NP as PCP - General (Geriatric Medicine) Estevan Oaks, NP as Nurse Practitioner [facility])  Extended Emergency Contact Information Primary Emergency Contact: [name],[name] Address: [address]          [place], [place] [address] [place] Phone: [phone] Relation: Daughter Secondary Emergency Contact: Bottomley,Marsha Address: [address]          [place], [place] [address] [place] Phone: [phone] Relation: Daughter  Goals of care: Advanced Directive information Advanced Directives 03/11/2018  Does Patient Have a Medical Advance Directive? Yes  Type of Advance Directive [place]  Does patient want to make changes to medical advance directive? No - Patient declined  Copy of [place] in Chart? Yes - validated most recent copy scanned in chart (See row information)  Would patient like information on creating a medical advance directive? -  Pre-existing out of facility DNR order (yellow form or pink MOST form) -     Chief Complaint  Patient presents with  . Acute Visit    Patient' granddaughter states mental status has changed , crying frequent, more confused, frequent urination, quick to get angry, wandering more, and strong sexual desires.     HPI:  Pt is a 85 y.o. female seen today for an acute visit for evaluation worsening mental status changes x 1 week.she is here today escorted by her granddaughter who provides most information.Patient states " I'm here because im mean and angry.Grandaughter states patient has been more angry,confused,frequent urination,more incontinence,crying , wandering out of the house x 2,pacing at night.she is noted on house camera pacing in the night.she gets up at 12 pm for breakfast .Tries to leave the house  to look for her deacesed husband.she  lives with her daughter who is wheelchair bound.she has Private pay care giver's who comes in for few hours a day to assist with her ADL's.[place] one week ago with lower back bruise unclear if trip on grandchild.Graddaughter states unable to complete her sentences when she is agitated.    Past Medical History:  Diagnosis Date  . Abnormality of gait   . Alzheimer's disease ([place])   . Calculus of kidney   . Closed fracture of seventh cervical vertebra without mention of spinal cord injury   . Depressive disorder, not elsewhere classified   . Edema   . Edema   . External hemorrhoids without mention of complication   . Gastric ulcer, unspecified as acute or chronic, without mention of hemorrhage, perforation, or obstruction   . GERD (gastroesophageal reflux disease)   . Hypertension   . Insomnia, unspecified   . Loss of weight   . Osteoarthrosis, unspecified whether generalized or localized, unspecified site   . Other protein-calorie malnutrition   . Pain in thoracic spine   . Personal history of fall   . Unspecified constipation   . Unspecified hereditary and idiopathic peripheral neuropathy   . Unspecified hypothyroidism   . Unspecified vitamin D deficiency   . Urinary frequency    Past Surgical History:  Procedure Laterality Date  . TEE WITHOUT CARDIOVERSION N/A 11/13/2016   Procedure: TRANSESOPHAGEAL ECHOCARDIOGRAM (TEE);  Surgeon: Jerline Pain, MD;  Location: [facility] ENDOSCOPY;  Service: Cardiovascular;  Laterality: N/A;  . WRIST SURGERY Right 2013    No Known Allergies  Outpatient Encounter Medications as of 03/26/2018  Medication Sig  . acetaminophen (  TYLENOL) 325 MG tablet Take 2 tablets (650 mg total) by mouth every 6 (six) hours as needed.  Marland Kitchen amLODipine (NORVASC) 5 MG tablet Take 1 tablet (5 mg total) by mouth daily.  Marland Kitchen buPROPion (WELLBUTRIN XL) 150 MG 24 hr tablet TAKE 1 TABLET(150 MG) BY MOUTH DAILY  . levothyroxine (SYNTHROID, LEVOTHROID) 88 MCG tablet TAKE ONE TABLET  ONCE  DAILY 30 MINUTES BEFORE BREAKFAST FOR THYROID  . Memantine HCl-Donepezil HCl (NAMZARIC) 28-10 MG CP24 Take 1 capsule by mouth daily.  Marland Kitchen venlafaxine XR (EFFEXOR-XR) 75 MG 24 hr capsule TAKE 1 CAPSULE EVERY DAY WITH BREAKFAST  . Vitamin D, Ergocalciferol, (DRISDOL) 1.25 MG (50000 UT) CAPS capsule Take one capsule by mouth every 14 days   No facility-administered encounter medications on file as of 03/26/2018.     Review of Systems  Unable to perform ROS: Dementia (additonal information provided by granddaughter )  Constitutional: Negative for appetite change, chills, fatigue and fever.  HENT: Negative for congestion, rhinorrhea, sinus pressure, sinus pain, sneezing and sore throat.   Respiratory: Negative for cough, chest tightness, shortness of breath and wheezing.   Cardiovascular: Negative for chest pain and leg swelling.  Gastrointestinal: Negative for abdominal distention, constipation, diarrhea, nausea and vomiting.       Chronic right lower abdominal pain   Genitourinary: Positive for flank pain and frequency. Negative for dysuria and urgency.       Increased urine incontinence   Musculoskeletal: Positive for gait problem.       Right rib/flank pain post fall   Skin: Negative for color change, pallor and rash.  Neurological: Negative for dizziness, light-headedness and headaches.  Psychiatric/Behavioral: Positive for agitation and confusion. The patient is not nervous/anxious.     Immunization History  Administered Date(s) Administered  . Influenza, High Dose Seasonal PF 12/10/2016  . Influenza,inj,Quad PF,6+ Mos 11/27/2015  . PPD Test 01/22/2015  . Pneumococcal Conjugate-13 11/27/2015  . Pneumococcal Polysaccharide-23 03/06/2017   Pertinent  Health Maintenance Due  Topic Date Due  . INFLUENZA VACCINE  03/12/2019 (Originally 09/17/2017)  . DEXA SCAN  Completed  . PNA vac Low Risk Adult  Completed   Fall Risk  03/26/2018 03/11/2018 08/12/2017 07/08/2017 04/27/2017  Falls in the past  year? 1 1 Yes Yes Yes  Number falls in past yr: 1 0 2 or more 2 or more 2 or more  Comment - - - - -  Injury with Fall? 1 0 No Yes -  Comment - - - - -  Risk Factor Category  - - - - -  Risk for fall due to : - - - - -    Vitals:   03/26/18 1411  BP: 110/70  Pulse: 86  Temp: (!) 97.4 F (36.3 C)  TempSrc: Oral  SpO2: 97%  Weight: 134 lb 3.2 oz (60.9 kg)  Height: _0  (1.626 m)   Body mass index is 23.04 kg/m. Physical Exam Vitals signs reviewed.  Constitutional:      General: She is not in acute distress.    Appearance: She is normal weight.  HENT:     Head: Normocephalic.     Right Ear: There is impacted cerumen.     Left Ear: Tympanic membrane, ear canal and external ear normal.     Nose: Nose normal. No congestion or rhinorrhea.     Mouth/Throat:     Mouth: Mucous membranes are moist.     Pharynx: Oropharynx is clear. No oropharyngeal exudate or posterior oropharyngeal erythema.  Eyes:     General: No scleral icterus.       Right eye: No discharge.        Left eye: No discharge.     Conjunctiva/sclera: Conjunctivae normal.     Pupils: Pupils are equal, round, and reactive to light.  Cardiovascular:     Pulses: Normal pulses.     Heart sounds: No murmur. No friction rub. No gallop.   Pulmonary:     Effort: Pulmonary effort is normal. No respiratory distress.     Breath sounds: Normal breath sounds. No wheezing, rhonchi or rales.       Comments: Right flank/rib cage tenderness to palpation  Abdominal:     General: Bowel sounds are normal. There is no distension.     Palpations: Abdomen is soft.     Tenderness: There is no right CVA tenderness, left CVA tenderness, guarding or rebound.     Comments: suprapubic tenderness to palpation    Musculoskeletal:        General: No swelling.     Right lower leg: No edema.     Left lower leg: No edema.     Comments: Right flank/rib cage area tender to palpation and with movement.Groaning when standing and while  sitting due to pain.   Skin:    General: Skin is warm and dry.     Coloration: Skin is not pale.     Findings: No erythema or rash.     Comments: Right flank/ribcage old large purplish/yellow bruise noted.   Neurological:     Mental Status: She is alert.     Sensory: No sensory deficit.     Motor: No weakness.     Coordination: Coordination normal.     Gait: Gait abnormal.     Comments: Alert and oriented to name,city and person but not date/month at baseline per granddaughter.   Psychiatric:        Mood and Affect: Mood normal.        Speech: Speech normal.        Behavior: Behavior is agitated. Behavior is not aggressive or combative. Behavior is cooperative.        Thought Content: Thought content normal.        Cognition and Memory: Memory is impaired.        Judgment: Judgment normal.     Comments: Arguing with granddaughter at times during visit then makes jokes with her.      Labs reviewed: Recent Labs    07/01/17 0251 07/08/17 1604 08/12/17 0428  NA 139 139 142  K 3.7 3.7 3.2*  CL 103 101 102  CO2 _0 GLUCOSE 112* 93 92  BUN _1 CREATININE 1.05* 1.14* 0.93*  CALCIUM 8.7* 9.1 9.6   Recent Labs    06/29/17 2000 07/08/17 1604 08/12/17 0428  AST _2 ALT 12* 15 9  ALKPHOS 102  --   --   BILITOT 0.5 0.4 0.5  PROT 6.9 6.7 7.6  ALBUMIN 3.7  --   --    Recent Labs    06/29/17 2000  07/01/17 0251 07/08/17 1604 08/12/17 0428  WBC 10.6*   < > 14.7* 6.9 7.6  NEUTROABS 7.2  --   --  4,195 4,590  HGB 14.2   < > 14.0 13.2 14.6  HCT 42.7   < > 41.7 37.7 41.3  MCV 101.7*   < > 99.5 96.7 96.7  PLT 208   < >  217 251 266   < > = values in this interval not displayed.   Lab Results  Component Value Date   TSH 0.49 08/12/2017   Lab Results  Component Value Date   HGBA1C 5.2 11/11/2016   Lab Results  Component Value Date   CHOL 156 11/11/2016   HDL 61 11/11/2016   LDLCALC 87 11/11/2016   TRIG 42 11/11/2016   CHOLHDL 2.6 11/11/2016     Significant Diagnostic Results in last 30 days:  No results found.  Assessment/Plan 1. UTI symptoms Afebrile.increased confusion and agitation.unable to collect urine specimen during visit.POA will collect urine at home then bring to office sterile cup and [place] given. - Encouraged to increase fluid intake " does not like water"  - Urinalysis with Reflex Microscopic - Culture, Urine  2. Confusion Afebrile.Has had x 2 episodes of wondering out of the house this could be possible decline of dementia.  - CMP with eGFR([facility]) - CBC with Differential/Platelet - Culture, Urine  3. Rib pain on right side Status post fall 1 week ago due to possible ? tripping on grandchild. Pain worst with movement. - Tylenol 500 mg tablet one by mouth every 6 hours as needed for pain - DG Ribs Unilateral Right; Future  4. Fall at home, initial encounter ? Tripped on grand child at home.Ambulates with walker. Fall and safety precautions.   5. Bruise, trunk, initial encounter Status post fall episode at home.purple -yellowish skin discoloration on right Flank/rib cage area tender to palpation and movement.DG x-ray as above.  Family/ staff Communication: Reviewed plan of care with patient and Granddaughter.   Labs/tests ordered:  -CBC/diff  - DG Ribs Unilateral Right; Future  Dinah C Ngetich, NP

## 2018-03-26 NOTE — Patient Instructions (Signed)
1.Tylenol 325 mg tablet one by mouth every 6 hours as needed for pain. 2. Collect sterile urine specimen and bring specimen as instructed. 3. Right rib cage x-ray ordered at [place]  4. Notify provider's office if symptoms worsen or running a temp > 100.5  5. Encourage fluid intake

## 2018-04-02 ENCOUNTER — Other Ambulatory Visit: Payer: Self-pay | Admitting: Nurse Practitioner

## 2018-04-02 DIAGNOSIS — F332 Major depressive disorder, recurrent severe without psychotic features: Secondary | ICD-10-CM

## 2018-04-02 NOTE — Telephone Encounter (Signed)
Left message on voicemail for patient to return call when available . Reason for call: RX was filled March 11, 2018 to [facility] for a 90 day supply with an additional refill, now a request is coming from [facility]

## 2018-04-02 NOTE — Telephone Encounter (Signed)
Spoke with [name], patients daughter. Patient is using [employer] and did not request refill from [facility]

## 2018-04-08 ENCOUNTER — Other Ambulatory Visit: Payer: Medicare HMO | Admitting: Primary Care

## 2018-04-08 DIAGNOSIS — G308 Other Alzheimer's disease: Secondary | ICD-10-CM

## 2018-04-08 DIAGNOSIS — F0281 Dementia in other diseases classified elsewhere with behavioral disturbance: Secondary | ICD-10-CM | POA: Diagnosis not present

## 2018-04-08 DIAGNOSIS — Z515 Encounter for palliative care: Secondary | ICD-10-CM

## 2018-04-08 DIAGNOSIS — R296 Repeated falls: Secondary | ICD-10-CM

## 2018-04-08 DIAGNOSIS — F02818 Dementia in other diseases classified elsewhere, unspecified severity, with other behavioral disturbance: Secondary | ICD-10-CM

## 2018-04-08 NOTE — Progress Notes (Signed)
[facility] Consult Note Telephone: [phone]  Fax: [phone]  PATIENT NAME: [name] DOB: [birth year] MRN: [id number]  PRIMARY CARE PROVIDER:   [name], NP  REFERRING PROVIDER:  [name], NP [place], [place] [address]  RESPONSIBLE PARTY:   Extended Emergency Contact Information Primary Emergency Contact: [name],[name] Address: [address]          [place], [place] [address] [place] Phone: [phone] Relation: Daughter Secondary Emergency Contact: Bottomley,Marsha Address: [address]          [place], [place] [address] [place] Phone: [phone] Relation: Daughter  ASSESSMENT and RECOMMENDATIONS:   1. Safety: Family has installed door alarms and locks.  Patient has wandered several times, once in the night out of the house  and was found at neighbors. Family has installed locks which cannot be opened easily. There is a loud monitor when the door also opens.  2. Goals of Care: Currently cared for at home. Family states they considered placement when patient behavior problems escalate.  They have not looked for a facility. Encouraged to know options in case status quo changes suddenly.  3. Frequent falls: Recommend medication review. Continues to fall frequently, instructed that eventual injury is probable. Patient does not seem to have an infection or process leading to an increase in falls.  4. Behavior disruption:  Due to escalation of disruptive behaviors, I would recommend a neurology or geriatric psychiatry consultation to review medications in light of disease process. Patient has begun to exhibit more combativeness and sexual ideation, which can be signs of disease progression. Reviewed medications which include bupropion, namzaric, venlafaxine. Family is willing to pursue consultation for medication management.   Palliative care will  continue to follow patient for goals of care clarification, symptom management. Return 1 month.  I spent 25 minutes providing this consultation,  from 1100 to 1125. More than 50% of the time in this consultation was spent coordinating communication.   HISTORY OF PRESENT ILLNESS:  [name] is a 85 y.o. year old female with multiple medical problems including Alzhiemer's dementia, frequent falls, PVD, h/o fractures, constipation.. Palliative Care was asked to help address goals of care.   CODE STATUS: DNR  PPS: 40% HOSPICE ELIGIBILITY/DIAGNOSIS: TBD  PAST MEDICAL HISTORY:  Past Medical History:  Diagnosis Date  . Abnormality of gait   . Alzheimer's disease ([place])   . Calculus of kidney   . Closed fracture of seventh cervical vertebra without mention of spinal cord injury   . Depressive disorder, not elsewhere classified   . Edema   . Edema   . External hemorrhoids without mention of complication   . Gastric ulcer, unspecified as acute or chronic, without mention of hemorrhage, perforation, or obstruction   . GERD (gastroesophageal reflux disease)   . Hypertension   . Insomnia, unspecified   . Loss of weight   . Osteoarthrosis, unspecified whether generalized or localized, unspecified site   . Other protein-calorie malnutrition   . Pain in thoracic spine   . Personal history of fall   . Unspecified constipation   . Unspecified hereditary and idiopathic peripheral neuropathy   . Unspecified hypothyroidism   . Unspecified vitamin D deficiency   . Urinary frequency     SOCIAL HX:  Social History   Tobacco Use  . Smoking status: Never Smoker  . Smokeless tobacco: Never Used  Substance Use Topics  .  Alcohol use: No    ALLERGIES: No Known Allergies   PERTINENT MEDICATIONS:  Outpatient Encounter Medications as of 04/08/2018  Medication Sig  . acetaminophen (TYLENOL) 325 MG tablet Take 2 tablets (650 mg total) by mouth every 6 (six) hours as needed.  Marland Kitchen amLODipine (NORVASC)  5 MG tablet Take 1 tablet (5 mg total) by mouth daily.  Marland Kitchen buPROPion (WELLBUTRIN XL) 150 MG 24 hr tablet TAKE 1 TABLET(150 MG) BY MOUTH DAILY  . levothyroxine (SYNTHROID, LEVOTHROID) 88 MCG tablet TAKE ONE TABLET  ONCE DAILY 30 MINUTES BEFORE BREAKFAST FOR THYROID  . Memantine HCl-Donepezil HCl (NAMZARIC) 28-10 MG CP24 Take 1 capsule by mouth daily.  Marland Kitchen venlafaxine XR (EFFEXOR-XR) 75 MG 24 hr capsule TAKE 1 CAPSULE EVERY DAY WITH BREAKFAST  . Vitamin D, Ergocalciferol, (DRISDOL) 1.25 MG (50000 UT) CAPS capsule Take one capsule by mouth every 14 days   No facility-administered encounter medications on file as of 04/08/2018.     PHYSICAL EXAM:  VS deferred due to resistance  General: NAD, frail appearing, thin Cardiovascular: regular rate and rhythm, S1S2 Pulmonary: no cough Abdomen: soft, nontender, + bowel sounds, endorses constipation GU: no suprapubic tenderness, continent of urine Extremities: no edema, no joint deformities Skin: no rashes, wounds Neurological: Weakness , memory and behavioral issues, resistant to direction. Cyndia Skeeters DNP, AGPCNP-BC

## 2018-05-17 DIAGNOSIS — S62606A Fracture of unspecified phalanx of right little finger, initial encounter for closed fracture: Secondary | ICD-10-CM | POA: Diagnosis not present

## 2018-05-18 ENCOUNTER — Telehealth: Payer: Self-pay | Admitting: Nurse Practitioner

## 2018-05-18 DIAGNOSIS — M79642 Pain in left hand: Secondary | ICD-10-CM

## 2018-05-18 NOTE — Telephone Encounter (Signed)
Referral placed.

## 2018-05-18 NOTE — Telephone Encounter (Signed)
Pt had fall on Sunday (3/29), didn't go to ED bc of all the crisis now. Monday am (3/30), she called [facility] & was able to get a virtual visit where they could see her left hand/finger that was causing pain from the fall.  Tues(3/31) daughter(Jean) realized they need a referral sent over to [facility] for this incident.  If you need to call for update on the incident with the pt, you may. The daughter(Marsha) lives with pt. Home# [phone] [name] # [phone]  Thanks, Vilinda Blanks.

## 2018-05-19 ENCOUNTER — Encounter (HOSPITAL_COMMUNITY): Payer: Self-pay

## 2018-05-19 ENCOUNTER — Other Ambulatory Visit: Payer: Self-pay

## 2018-05-19 ENCOUNTER — Emergency Department (HOSPITAL_COMMUNITY): Payer: Medicare HMO

## 2018-05-19 ENCOUNTER — Emergency Department (HOSPITAL_COMMUNITY)
Admission: EM | Admit: 2018-05-19 | Discharge: 2018-05-19 | Disposition: A | Discharge status: Home / Self Care | Payer: Medicare HMO | Attending: Emergency Medicine | Admitting: Emergency Medicine

## 2018-05-19 DIAGNOSIS — W0110XA Fall on same level from slipping, tripping and stumbling with subsequent striking against unspecified object, initial encounter: Secondary | ICD-10-CM | POA: Diagnosis not present

## 2018-05-19 DIAGNOSIS — S62607A Fracture of unspecified phalanx of left little finger, initial encounter for closed fracture: Secondary | ICD-10-CM | POA: Diagnosis not present

## 2018-05-19 DIAGNOSIS — S62617A Displaced fracture of proximal phalanx of left little finger, initial encounter for closed fracture: Secondary | ICD-10-CM | POA: Diagnosis not present

## 2018-05-19 DIAGNOSIS — Z23 Encounter for immunization: Secondary | ICD-10-CM | POA: Insufficient documentation

## 2018-05-19 DIAGNOSIS — M25522 Pain in left elbow: Secondary | ICD-10-CM | POA: Diagnosis not present

## 2018-05-19 DIAGNOSIS — S5002XA Contusion of left elbow, initial encounter: Secondary | ICD-10-CM | POA: Diagnosis not present

## 2018-05-19 DIAGNOSIS — S51012A Laceration without foreign body of left elbow, initial encounter: Secondary | ICD-10-CM | POA: Insufficient documentation

## 2018-05-19 DIAGNOSIS — Y999 Unspecified external cause status: Secondary | ICD-10-CM | POA: Diagnosis not present

## 2018-05-19 DIAGNOSIS — T148XXA Other injury of unspecified body region, initial encounter: Secondary | ICD-10-CM | POA: Insufficient documentation

## 2018-05-19 DIAGNOSIS — Y939 Activity, unspecified: Secondary | ICD-10-CM | POA: Diagnosis not present

## 2018-05-19 DIAGNOSIS — Z79899 Other long term (current) drug therapy: Secondary | ICD-10-CM | POA: Diagnosis not present

## 2018-05-19 DIAGNOSIS — Y929 Unspecified place or not applicable: Secondary | ICD-10-CM | POA: Diagnosis not present

## 2018-05-19 DIAGNOSIS — E039 Hypothyroidism, unspecified: Secondary | ICD-10-CM | POA: Diagnosis not present

## 2018-05-19 DIAGNOSIS — S60052A Contusion of left little finger without damage to nail, initial encounter: Secondary | ICD-10-CM | POA: Diagnosis not present

## 2018-05-19 DIAGNOSIS — I10 Essential (primary) hypertension: Secondary | ICD-10-CM | POA: Diagnosis not present

## 2018-05-19 DIAGNOSIS — F0390 Unspecified dementia without behavioral disturbance: Secondary | ICD-10-CM | POA: Insufficient documentation

## 2018-05-19 DIAGNOSIS — M25512 Pain in left shoulder: Secondary | ICD-10-CM | POA: Diagnosis not present

## 2018-05-19 DIAGNOSIS — R270 Ataxia, unspecified: Secondary | ICD-10-CM | POA: Diagnosis not present

## 2018-05-19 DIAGNOSIS — T07XXXA Unspecified multiple injuries, initial encounter: Secondary | ICD-10-CM

## 2018-05-19 DIAGNOSIS — S62609A Fracture of unspecified phalanx of unspecified finger, initial encounter for closed fracture: Secondary | ICD-10-CM

## 2018-05-19 LAB — COMPREHENSIVE METABOLIC PANEL
ALT: 21 U/L (ref 0–44)
AST: 27 U/L (ref 15–41)
Albumin: 4.4 g/dL (ref 3.5–5.0)
Alkaline Phosphatase: 98 U/L (ref 38–126)
Anion gap: 11 (ref 5–15)
BUN: 19 mg/dL (ref 8–23)
CO2: 24 mmol/L (ref 22–32)
Calcium: 9 mg/dL (ref 8.9–10.3)
Chloride: 105 mmol/L (ref 98–111)
Creatinine, Ser: 0.88 mg/dL (ref 0.44–1.00)
GFR calc Af Amer: >60 mL/min (ref >60)
GFR calc non Af Amer: 60 mL/min — ABNORMAL LOW (ref >60)
Glucose, Bld: 92 mg/dL (ref 70–99)
Potassium: 3.9 mmol/L (ref 3.5–5.1)
Sodium: 140 mmol/L (ref 135–145)
Total Bilirubin: 0.8 mg/dL (ref 0.3–1.2)
Total Protein: 7.7 g/dL (ref 6.5–8.1)

## 2018-05-19 LAB — CBC WITH DIFFERENTIAL/PLATELET
Abs Immature Granulocytes: 0.04 10*3/uL (ref 0.00–0.07)
Basophils Absolute: 0.1 10*3/uL (ref 0.0–0.1)
Basophils Relative: 1 %
Eosinophils Absolute: 0.1 10*3/uL (ref 0.0–0.5)
Eosinophils Relative: 2 %
HCT: 44.1 % (ref 36.0–46.0)
Hemoglobin: 14.6 g/dL (ref 12.0–15.0)
Immature Granulocytes: 1 %
Lymphocytes Relative: 21 %
Lymphs Abs: 1.8 10*3/uL (ref 0.7–4.0)
MCH: 34.8 pg — ABNORMAL HIGH (ref 26.0–34.0)
MCHC: 33.1 g/dL (ref 30.0–36.0)
MCV: 105.3 fL — ABNORMAL HIGH (ref 80.0–100.0)
Monocytes Absolute: 0.7 10*3/uL (ref 0.1–1.0)
Monocytes Relative: 8 %
Neutro Abs: 5.8 10*3/uL (ref 1.7–7.7)
Neutrophils Relative %: 67 %
Platelets: 226 10*3/uL (ref 150–400)
RBC: 4.19 MIL/uL (ref 3.87–5.11)
RDW: 12.9 % (ref 11.5–15.5)
WBC: 8.6 10*3/uL (ref 4.0–10.5)
nRBC: 0 % (ref 0.0–0.2)

## 2018-05-19 LAB — URINALYSIS, ROUTINE W REFLEX MICROSCOPIC
Bilirubin Urine: NEGATIVE
Glucose, UA: NEGATIVE mg/dL
Hgb urine dipstick: NEGATIVE
Ketones, ur: 5 mg/dL — AB
Nitrite: POSITIVE — AB
Protein, ur: NEGATIVE mg/dL
Specific Gravity, Urine: 1.015 (ref 1.005–1.030)
pH: 7 (ref 5.0–8.0)

## 2018-05-19 MED ORDER — CEPHALEXIN 500 MG PO CAPS: 500.0000 mg | ORAL_CAPSULE | Freq: Two times a day (BID) | ORAL | 0 refills | Status: DC

## 2018-05-19 MED ORDER — TETANUS-DIPHTH-ACELL PERTUSSIS 5-2.5-18.5 LF-MCG/0.5 IM SUSP
0.5000 mL | Freq: Once | INTRAMUSCULAR | Status: AC
  Administered 2018-05-19: 0.5 mL via INTRAMUSCULAR
  Filled 2018-05-19: qty 0.5

## 2018-05-19 NOTE — ED Triage Notes (Addendum)
She is brought here by her daughter, who tells [place] pt. Has fallen "three times recently". She c/o pain at left shoulder/arm, hip, and has perhaps a dislocated finger. She is alert and in no distress. Pt. Has known dx of dementia. Her daugther, [name] [name] phone number is [phone].

## 2018-05-19 NOTE — ED Provider Notes (Signed)
[facility] DEPT Provider Note   CSN: [id number] Arrival date & time: 05/19/18  1524    History   Chief Complaint Chief Complaint  Patient presents with  . Hand Injury  . Shoulder Pain  . Hip Pain    HPI [name] is a 85 y.o. female.     HPI Level 5 caveat for severe dementia. 85 year old female comes in a chief complaint of fall. Patient has history of dementia and is not a good historian.  I spoke with patient's daughter.  She states that patient lives by herself and has home presence in form of family members.  Over the last few days however she has been having increased falls and balance issues.  Patient normally does not always use a walker when ambulating.  She had a fall last week, did an ED visit with orthopedist and she was told that she likely has a broken finger or dislocated digit.  They decided to stay home, but patient has had 2 falls since then including 1 today.  She also reports that patient has foul-smelling urine.  There is no history of UTIs.  Patient denies any back pain, burning with urination, blood in the urine, fevers, chills.  She is complaining of shoulder pain.    Past Medical History:  Diagnosis Date  . Abnormality of gait   . Alzheimer's disease ([place])   . Calculus of kidney   . Closed fracture of seventh cervical vertebra without mention of spinal cord injury   . Depressive disorder, not elsewhere classified   . Edema   . Edema   . External hemorrhoids without mention of complication   . Gastric ulcer, unspecified as acute or chronic, without mention of hemorrhage, perforation, or obstruction   . GERD (gastroesophageal reflux disease)   . Hypertension   . Insomnia, unspecified   . Loss of weight   . Osteoarthrosis, unspecified whether generalized or localized, unspecified site   . Other protein-calorie malnutrition   . Pain in thoracic spine   . Personal history of fall   . Unspecified constipation    . Unspecified hereditary and idiopathic peripheral neuropathy   . Unspecified hypothyroidism   . Unspecified vitamin D deficiency   . Urinary frequency     Patient Active Problem List   Diagnosis Date Noted  . BRBPR (bright red blood per rectum) 06/30/2017  . Colitis 06/30/2017  . Protein-calorie malnutrition, severe 11/13/2016  . Multiple falls   . Encephalopathy   . Agitation   . Slurred speech 11/10/2016  . Vitamin D deficiency 03/07/2015  . Hx of fracture of vertebral column 12/28/2014  . Constipation 07/27/2013  . Fracture of vertebra 07/27/2013  . Loss of weight 07/13/2013  . Peripheral vascular disease, unspecified ([place]) 07/13/2013  . Epigastric mass 07/13/2013  . Dementia with behavioral disturbance ([place])   . Major depression ([place])   . Insomnia, unspecified   . Fall 09/07/2012  . Fracture of right distal radius 09/07/2012  . Hypothyroidism 08/18/2006  . HYPERCHOLESTEROLEMIA 08/18/2006  . Essential hypertension 08/18/2006  . Restless legs 08/18/2006    Past Surgical History:  Procedure Laterality Date  . TEE WITHOUT CARDIOVERSION N/A 11/13/2016   Procedure: TRANSESOPHAGEAL ECHOCARDIOGRAM (TEE);  Surgeon: Jerline Pain, MD;  Location: [facility] ENDOSCOPY;  Service: Cardiovascular;  Laterality: N/A;  . WRIST SURGERY Right 2013     OB History   No obstetric history on file.      Home Medications    Prior to  Admission medications   Medication Sig Start Date End Date Taking? Authorizing Provider  acetaminophen (TYLENOL) 325 MG tablet Take 2 tablets (650 mg total) by mouth every 6 (six) hours as needed. 07/24/17  Yes Jacqlyn Larsen, PA-C  amLODipine (NORVASC) 5 MG tablet Take 1 tablet (5 mg total) by mouth daily. 03/11/18  Yes Ngetich, Dinah C, NP  buPROPion (WELLBUTRIN XL) 150 MG 24 hr tablet TAKE 1 TABLET(150 MG) BY MOUTH DAILY Patient taking differently: Take 150 mg by mouth daily.  03/11/18  Yes Ngetich, Dinah C, NP  levothyroxine (SYNTHROID, LEVOTHROID) 88 MCG tablet  TAKE ONE TABLET  ONCE DAILY 30 MINUTES BEFORE BREAKFAST FOR THYROID Patient taking differently: Take 88 mcg by mouth daily before breakfast.  03/11/18  Yes Ngetich, Dinah C, NP  Memantine HCl-Donepezil HCl (NAMZARIC) 28-10 MG CP24 Take 1 capsule by mouth daily. 03/11/18  Yes Ngetich, Dinah C, NP  venlafaxine XR (EFFEXOR-XR) 75 MG 24 hr capsule TAKE 1 CAPSULE EVERY DAY WITH BREAKFAST Patient taking differently: Take 75 mg by mouth at bedtime.  03/11/18  Yes Ngetich, Dinah C, NP  Vitamin D, Ergocalciferol, (DRISDOL) 1.25 MG (50000 UT) CAPS capsule Take one capsule by mouth every 14 days 03/11/18  Yes Ngetich, Dinah C, NP  cephALEXin (KEFLEX) 500 MG capsule Take 1 capsule (500 mg total) by mouth 2 (two) times daily. 05/19/18   Varney Biles, MD    Family History Family History  Problem Relation Age of Onset  . Cancer Mother   . CVA Father   . Cancer Brother   . Alcohol abuse Sister     Social History Social History   Tobacco Use  . Smoking status: Never Smoker  . Smokeless tobacco: Never Used  Substance Use Topics  . Alcohol use: No  . Drug use: No     Allergies   Patient has no known allergies.   Review of Systems Review of Systems  Unable to perform ROS: Dementia  Constitutional: Positive for activity change.  Musculoskeletal: Positive for arthralgias.  Skin: Positive for wound.  Neurological: Positive for dizziness.  Hematological: Does not bruise/bleed easily.     Physical Exam Updated Vital Signs BP (!) 165/90 (BP Location: Right Arm)   Pulse 79   Temp 97.8 F (36.6 C) (Oral)   Resp 16   SpO2 99%   Physical Exam Vitals signs and nursing note reviewed.  Constitutional:      Appearance: She is well-developed.  HENT:     Head: Atraumatic.  Neck:     Musculoskeletal: Normal range of motion and neck supple.  Cardiovascular:     Rate and Rhythm: Normal rate.  Pulmonary:     Effort: Pulmonary effort is normal.  Abdominal:     General: Bowel sounds are normal.   Musculoskeletal:        General: Tenderness present. No swelling or deformity.     Comments: Deformity over the fifth digit.  Likely fracture with angulation of 30 degrees.  Skin:    General: Skin is warm and dry.     Comments: 2 cm laceration that is superficial in nature by the left elbow.  Neurological:     Mental Status: She is alert and oriented to person, place, and time.      ED Treatments / Results  Labs (all labs ordered are listed, but only abnormal results are displayed) Labs Reviewed  COMPREHENSIVE METABOLIC PANEL - Abnormal; Notable for the following components:      Result Value  GFR calc non Af Amer 60 (*)    All other components within normal limits  CBC WITH DIFFERENTIAL/PLATELET - Abnormal; Notable for the following components:   MCV 105.3 (*)    MCH 34.8 (*)    All other components within normal limits  URINALYSIS, ROUTINE W REFLEX MICROSCOPIC - Abnormal; Notable for the following components:   APPearance HAZY (*)    Ketones, ur 5 (*)    Nitrite POSITIVE (*)    Leukocytes,Ua SMALL (*)    Bacteria, UA FEW (*)    All other components within normal limits  URINE CULTURE    EKG None  Radiology Dg Elbow Complete Left  Result Date: 05/19/2018 CLINICAL DATA:  Left elbow pain after fall. EXAM: LEFT ELBOW - COMPLETE 3+ VIEW COMPARISON:  None. FINDINGS: There is no evidence of fracture, dislocation, or joint effusion. There is no evidence of arthropathy or other focal bone abnormality. Soft tissues are unremarkable. IMPRESSION: Negative. Electronically Signed   By: Marijo Conception, M.D.   On: 05/19/2018 17:49   Ct Head Wo Contrast  Result Date: 05/19/2018 CLINICAL DATA:  Patient has fallen multiple times recently. Dementia. EXAM: CT HEAD WITHOUT CONTRAST TECHNIQUE: Contiguous axial images were obtained from the base of the skull through the vertex without intravenous contrast. COMPARISON:  06/29/2017. FINDINGS: Brain: No evidence for acute infarction, hemorrhage,  mass lesion, hydrocephalus, or extra-axial fluid. Generalized atrophy. Hypoattenuation of white matter, likely small vessel disease. Chronic LEFT cerebellar posterior fossa meningioma, 10 mm in largest dimension, stable. Vascular: Calcification of the cavernous internal carotid arteries consistent with cerebrovascular atherosclerotic disease. No signs of intracranial large vessel occlusion. Skull: Calvarium intact. Sinuses/Orbits: No acute finding. Other: None. IMPRESSION: 1. Atrophy and small vessel disease. No acute intracranial findings. 2. Stable posterior fossa meningioma. Electronically Signed   By: Staci Righter M.D.   On: 05/19/2018 17:20   Dg Shoulder Left  Result Date: 05/19/2018 CLINICAL DATA:  Left shoulder pain after fall. EXAM: LEFT SHOULDER - 2+ VIEW COMPARISON:  None. FINDINGS: There is no evidence of fracture or dislocation. There is no evidence of arthropathy or other focal bone abnormality. Soft tissues are unremarkable. IMPRESSION: Negative. Electronically Signed   By: Marijo Conception, M.D.   On: 05/19/2018 17:48   Dg Hand Complete Left  Result Date: 05/19/2018 CLINICAL DATA:  Left hand injury after fall. EXAM: LEFT HAND - COMPLETE 3+ VIEW COMPARISON:  None. FINDINGS: Moderately displaced fracture is seen involving the proximal portion of the fifth proximal phalanx. Osteoarthritis is seen involving the first carpometacarpal joint. No soft tissue abnormality is noted. IMPRESSION: Moderately displaced fifth proximal phalangeal fracture. Osteoarthritis of first carpometacarpal joint. Electronically Signed   By: Marijo Conception, M.D.   On: 05/19/2018 17:47    Procedures Wound closure utilizing adhes only Date/Time: 05/19/2018 11:57 PM Performed by: Varney Biles, MD Authorized by: Varney Biles, MD  Consent: Verbal consent obtained. Consent given by: parent and guardian Patient understanding: patient states understanding of the procedure being performed Patient identity confirmed:  arm band Time out: Immediately prior to procedure a "time out" was called to verify the correct patient, procedure, equipment, support staff and site/side marked as required. Local anesthesia used: no  Anesthesia: Local anesthesia used: no  Sedation: Patient sedated: no  Comments: Steri-Strips utilizing Dermabond was applied to the elbow lac.    (including critical care time)  Medications Ordered in ED Medications  Tdap (BOOSTRIX) injection 0.5 mL (0.5 mLs Intramuscular Given 05/19/18 1822)  Initial Impression / Assessment and Plan / ED Course  I have reviewed the triage vital signs and the nursing notes.  Pertinent labs & imaging results that were available during my care of the patient were reviewed by me and considered in my medical decision making (see chart for details).        85 year old brought into the ER with complaints of multiple falls. It appears that she lives independently and although has history of falls, over the past week or so she is having more falls than usual.  Daughter is concerned that maybe there is a UTI.  She is also concerned about injury to her extremity.  On exam patient does have deformity over the digits of her left hand.  She also has a laceration to the left elbow.  She is ranging her shoulder and elbow without any significant discomfort.  She has no tenderness to palpation over her hips and she is ambulating, therefore we do not think she has a pelvic fracture.  Given the multiple falls, we will get a CT brain to make sure there is no brain bleed or new hydrocephalus.  We will try to get a urine sample here, but she does not have any known history of recurrent UTIs and no fevers, symptoms concerning for UTI from patient side.  Basic lab will be ordered since the turnaround time would be similar to imaging.  The laceration is superficial in nature.  We will likely put Dermabond and Steri-Strips over the wound by the elbow.  Tdap will be  updated.  11:58 PM Spoke with Dr. Apolonio Schneiders, hand.  He would like the patient to be followed up as an outpatient in his clinic. Results of the ER work-up were discussed with patient's daughter.  She said that she will consider going to the hand surgeon, but she does not anticipate significant benefit from the visit and given that there is coronavirus will likely stay home. I also informed her of possible UTI given that nitrite positive with some change in her behavior and balance issues.  She will be started on Keflex. Wound care recommendations also discussed.  Final Clinical Impressions(s) / ED Diagnoses   Final diagnoses:  Closed fracture dislocation of digit of hand, initial encounter  Multiple contusions  Laceration of left elbow, initial encounter    ED Discharge Orders         Ordered    cephALEXin (KEFLEX) 500 MG capsule  2 times daily     05/19/18 1944           Varney Biles, MD 05/19/18 2359

## 2018-05-19 NOTE — ED Notes (Signed)
Ortho tech made aware the need to apply splint

## 2018-05-19 NOTE — Discharge Instructions (Addendum)
Please take the antibiotics for UTI. You have a fracture fracture of your fifth finger, Dr. Apolonio Schneiders can see you in the clinic this week if you would like to be seen.  Keep the finger taped and splinted until you are evaluated.

## 2018-05-19 NOTE — ED Notes (Signed)
Bed: WA06 Expected date:  Expected time:  Means of arrival:  Comments: Multiple falls

## 2018-05-21 LAB — URINE CULTURE: Culture: >=100000 — AB

## 2018-07-07 ENCOUNTER — Other Ambulatory Visit: Payer: Self-pay

## 2018-07-07 ENCOUNTER — Other Ambulatory Visit: Payer: Medicare HMO | Admitting: Adult Health Nurse Practitioner

## 2018-07-07 ENCOUNTER — Telehealth: Payer: Self-pay | Admitting: Adult Health Nurse Practitioner

## 2018-07-07 DIAGNOSIS — Z515 Encounter for palliative care: Secondary | ICD-10-CM

## 2018-07-07 NOTE — Telephone Encounter (Signed)
Spoke with daughter to schedule visit.  Agreed to telephone visit this morning. Amy K. Olena Heckle NP

## 2018-07-07 NOTE — Progress Notes (Signed)
[facility] Consult Note Telephone: [phone]  Fax: [phone]  PATIENT NAME: [name] DOB: 03/15/1931 MRN: [id number]  PRIMARY CARE PROVIDER:   [name], NP  REFERRING PROVIDER:  [name], NP [place], [place] [address]  RESPONSIBLE PARTY:  Extended Emergency Contact Information Primary Emergency Contact: [name],[name] Address: [address]          [place], [place] [address] [place] Phone: [phone] Relation: Daughter Secondary Emergency Contact: Bottomley,Marsha Address: [address]          [place], [place] [address] [place] Phone: [phone] Relation: Daughter   Due to the COVID-19 crisis, this visit was done via telemedicine and it was initiated and consent by this patient and or family. Video-audio (telehealth) contact was unable to be done due to technical barriers from the patients side.    RECOMMENDATIONS and PLAN:  1.  Safety: Family has installed door alarms and locks.  No reported incidents of the patient wandering outside the home.  2. Goals of Care: Currently cared for at home. Goal is to keep patient in the home unless physical changes keep them from taking care of her at home.  Patient is a DNR.    3. Frequent falls: Patient reported by daughter to still have falls and loses her balance easily.  Patient does has a walker but mostly refuses or forgets to use it.  Patient was seen in ED on 05/19/2018 for increased falls and was found to have a broke finger on her left hand that was splinted and a laceration to left elbow in which dermabond and steristrips were applied.  Daughter did state that patient would not keep splint on for more than a day but the finger has healed though a little "crooked" with no reports of pain.  Patient was found to have a UTI when at the ED and was prescribed keflex for this.  Over all the daughter states  that she is back to normal.  4. Behavior disruption:  Daughter state that she does have some days in which she can have "mean spells."  Does not notice a pattern to them, states they are sporadic and just occasionally occur.  Does state that her mom can be stubborn and likes to go to bed and get up when she wants. Does state that her mom has poor hygiene because she refuses baths.  No reports of rashes or yeast infections that would be related with poor hygiene.  5.  Dementia.  FAST 6b.  Patient mostly sits.  But will get and ambulate unassisted which leads to her losing her balance and falling.  Do think PT would be of benefit with her advanced dementia.  Patient needs assistance with ADLs but is able to feed herself.  Daughter states that she is able to go to the bathroom but doesn't always clean herself well.  No reported changes in weight or appetite.  Once UTI corrected patient seems to be at baseline    I spent 30 minutes providing this consultation,  from 11:00 to 11:30. More than 50% of the time in this consultation was spent coordinating communication.   HISTORY OF PRESENT ILLNESS:  [name] is a 85 y.o. year old female with multiple medical problems including Alzhiemer's dementia, frequent falls, PVD, h/o fractures, constipation. Palliative Care was asked to help address goals of care.   CODE STATUS: DNR  PPS:  40% HOSPICE ELIGIBILITY/DIAGNOSIS: TBD  PAST MEDICAL HISTORY:  Past Medical History:  Diagnosis Date   Abnormality of gait    Alzheimer's disease ([place])    Calculus of kidney    Closed fracture of seventh cervical vertebra without mention of spinal cord injury    Depressive disorder, not elsewhere classified    Edema    Edema    External hemorrhoids without mention of complication    Gastric ulcer, unspecified as acute or chronic, without mention of hemorrhage, perforation, or obstruction    GERD (gastroesophageal reflux disease)    Hypertension     Insomnia, unspecified    Loss of weight    Osteoarthrosis, unspecified whether generalized or localized, unspecified site    Other protein-calorie malnutrition    Pain in thoracic spine    Personal history of fall    Unspecified constipation    Unspecified hereditary and idiopathic peripheral neuropathy    Unspecified hypothyroidism    Unspecified vitamin D deficiency    Urinary frequency     SOCIAL HX:  Social History   Tobacco Use   Smoking status: Never Smoker   Smokeless tobacco: Never Used  Substance Use Topics   Alcohol use: No    ALLERGIES: No Known Allergies   PERTINENT MEDICATIONS:  Outpatient Encounter Medications as of 07/07/2018  Medication Sig   acetaminophen (TYLENOL) 325 MG tablet Take 2 tablets (650 mg total) by mouth every 6 (six) hours as needed.   amLODipine (NORVASC) 5 MG tablet Take 1 tablet (5 mg total) by mouth daily.   buPROPion (WELLBUTRIN XL) 150 MG 24 hr tablet TAKE 1 TABLET(150 MG) BY MOUTH DAILY (Patient taking differently: Take 150 mg by mouth daily. )   cephALEXin (KEFLEX) 500 MG capsule Take 1 capsule (500 mg total) by mouth 2 (two) times daily.   levothyroxine (SYNTHROID, LEVOTHROID) 88 MCG tablet TAKE ONE TABLET  ONCE DAILY 30 MINUTES BEFORE BREAKFAST FOR THYROID (Patient taking differently: Take 88 mcg by mouth daily before breakfast. )   Memantine HCl-Donepezil HCl (NAMZARIC) 28-10 MG CP24 Take 1 capsule by mouth daily.   venlafaxine XR (EFFEXOR-XR) 75 MG 24 hr capsule TAKE 1 CAPSULE EVERY DAY WITH BREAKFAST (Patient taking differently: Take 75 mg by mouth at bedtime. )   Vitamin D, Ergocalciferol, (DRISDOL) 1.25 MG (50000 UT) CAPS capsule Take one capsule by mouth every 14 days   No facility-administered encounter medications on file as of 07/07/2018.       Sudeep Scheibel Jenetta Downer, NP

## 2018-08-10 ENCOUNTER — Other Ambulatory Visit: Payer: Self-pay | Admitting: Family

## 2018-08-10 DIAGNOSIS — F0281 Dementia in other diseases classified elsewhere with behavioral disturbance: Secondary | ICD-10-CM

## 2018-08-10 DIAGNOSIS — G301 Alzheimer's disease with late onset: Secondary | ICD-10-CM

## 2018-08-10 DIAGNOSIS — F332 Major depressive disorder, recurrent severe without psychotic features: Secondary | ICD-10-CM

## 2018-08-25 ENCOUNTER — Emergency Department (HOSPITAL_COMMUNITY): Payer: Medicare HMO

## 2018-08-25 ENCOUNTER — Emergency Department (HOSPITAL_COMMUNITY)
Admission: EM | Admit: 2018-08-25 | Discharge: 2018-08-25 | Disposition: A | Discharge status: Home / Self Care | Payer: Medicare HMO | Attending: Emergency Medicine | Admitting: Emergency Medicine

## 2018-08-25 ENCOUNTER — Encounter (HOSPITAL_COMMUNITY): Payer: Self-pay | Admitting: Clinical

## 2018-08-25 ENCOUNTER — Other Ambulatory Visit: Payer: Self-pay

## 2018-08-25 DIAGNOSIS — M79605 Pain in left leg: Secondary | ICD-10-CM | POA: Diagnosis not present

## 2018-08-25 DIAGNOSIS — Z79899 Other long term (current) drug therapy: Secondary | ICD-10-CM | POA: Diagnosis not present

## 2018-08-25 DIAGNOSIS — F0281 Dementia in other diseases classified elsewhere with behavioral disturbance: Secondary | ICD-10-CM | POA: Diagnosis not present

## 2018-08-25 DIAGNOSIS — E039 Hypothyroidism, unspecified: Secondary | ICD-10-CM | POA: Diagnosis not present

## 2018-08-25 DIAGNOSIS — R609 Edema, unspecified: Secondary | ICD-10-CM | POA: Diagnosis not present

## 2018-08-25 DIAGNOSIS — M25562 Pain in left knee: Secondary | ICD-10-CM | POA: Diagnosis not present

## 2018-08-25 DIAGNOSIS — F329 Major depressive disorder, single episode, unspecified: Secondary | ICD-10-CM | POA: Diagnosis not present

## 2018-08-25 DIAGNOSIS — M25561 Pain in right knee: Secondary | ICD-10-CM | POA: Diagnosis not present

## 2018-08-25 DIAGNOSIS — W19XXXA Unspecified fall, initial encounter: Secondary | ICD-10-CM | POA: Diagnosis not present

## 2018-08-25 DIAGNOSIS — R5381 Other malaise: Secondary | ICD-10-CM | POA: Diagnosis not present

## 2018-08-25 DIAGNOSIS — I10 Essential (primary) hypertension: Secondary | ICD-10-CM | POA: Insufficient documentation

## 2018-08-25 DIAGNOSIS — F0390 Unspecified dementia without behavioral disturbance: Secondary | ICD-10-CM | POA: Diagnosis not present

## 2018-08-25 DIAGNOSIS — G309 Alzheimer's disease, unspecified: Secondary | ICD-10-CM | POA: Insufficient documentation

## 2018-08-25 DIAGNOSIS — Z20828 Contact with and (suspected) exposure to other viral communicable diseases: Secondary | ICD-10-CM | POA: Insufficient documentation

## 2018-08-25 DIAGNOSIS — J9811 Atelectasis: Secondary | ICD-10-CM | POA: Diagnosis not present

## 2018-08-25 DIAGNOSIS — Z03818 Encounter for observation for suspected exposure to other biological agents ruled out: Secondary | ICD-10-CM | POA: Diagnosis not present

## 2018-08-25 LAB — CBC WITH DIFFERENTIAL/PLATELET
Abs Immature Granulocytes: 0.04 10*3/uL (ref 0.00–0.07)
Basophils Absolute: 0 10*3/uL (ref 0.0–0.1)
Basophils Relative: 1 %
Eosinophils Absolute: 0.1 10*3/uL (ref 0.0–0.5)
Eosinophils Relative: 1 %
HCT: 46.1 % — ABNORMAL HIGH (ref 36.0–46.0)
Hemoglobin: 16 g/dL — ABNORMAL HIGH (ref 12.0–15.0)
Immature Granulocytes: 1 %
Lymphocytes Relative: 21 %
Lymphs Abs: 1.5 10*3/uL (ref 0.7–4.0)
MCH: 35.7 pg — ABNORMAL HIGH (ref 26.0–34.0)
MCHC: 34.7 g/dL (ref 30.0–36.0)
MCV: 102.9 fL — ABNORMAL HIGH (ref 80.0–100.0)
Monocytes Absolute: 0.5 10*3/uL (ref 0.1–1.0)
Monocytes Relative: 8 %
Neutro Abs: 4.8 10*3/uL (ref 1.7–7.7)
Neutrophils Relative %: 68 %
Platelets: 227 10*3/uL (ref 150–400)
RBC: 4.48 MIL/uL (ref 3.87–5.11)
RDW: 12.1 % (ref 11.5–15.5)
WBC: 7 10*3/uL (ref 4.0–10.5)
nRBC: 0 % (ref 0.0–0.2)

## 2018-08-25 LAB — SARS CORONAVIRUS 2 BY RT PCR (HOSPITAL ORDER, PERFORMED IN ~~LOC~~ HOSPITAL LAB): SARS Coronavirus 2: NEGATIVE

## 2018-08-25 LAB — COMPREHENSIVE METABOLIC PANEL
ALT: 22 U/L (ref 0–44)
AST: 27 U/L (ref 15–41)
Albumin: 4.6 g/dL (ref 3.5–5.0)
Alkaline Phosphatase: 95 U/L (ref 38–126)
Anion gap: 11 (ref 5–15)
BUN: 13 mg/dL (ref 8–23)
CO2: 26 mmol/L (ref 22–32)
Calcium: 9.4 mg/dL (ref 8.9–10.3)
Chloride: 104 mmol/L (ref 98–111)
Creatinine, Ser: 0.77 mg/dL (ref 0.44–1.00)
GFR calc Af Amer: >60 mL/min (ref >60)
GFR calc non Af Amer: >60 mL/min (ref >60)
Glucose, Bld: 95 mg/dL (ref 70–99)
Potassium: 3 mmol/L — ABNORMAL LOW (ref 3.5–5.1)
Sodium: 141 mmol/L (ref 135–145)
Total Bilirubin: 0.8 mg/dL (ref 0.3–1.2)
Total Protein: 7.8 g/dL (ref 6.5–8.1)

## 2018-08-25 LAB — URINALYSIS, ROUTINE W REFLEX MICROSCOPIC
Bilirubin Urine: NEGATIVE
Glucose, UA: NEGATIVE mg/dL
Hgb urine dipstick: NEGATIVE
Ketones, ur: NEGATIVE mg/dL
Leukocytes,Ua: NEGATIVE
Nitrite: NEGATIVE
Protein, ur: NEGATIVE mg/dL
Specific Gravity, Urine: 1.008 (ref 1.005–1.030)
pH: 8 (ref 5.0–8.0)

## 2018-08-25 MED ORDER — VENLAFAXINE HCL ER 75 MG PO CP24: 75.0000 mg | ORAL_CAPSULE | Freq: Every day | ORAL | Status: DC

## 2018-08-25 MED ORDER — AMLODIPINE BESYLATE 5 MG PO TABS: 5.0000 mg | ORAL_TABLET | Freq: Every day | ORAL | Status: DC

## 2018-08-25 MED ORDER — LEVOTHYROXINE SODIUM 88 MCG PO TABS
88.0000 ug | ORAL_TABLET | Freq: Every day | ORAL | Status: DC
  Filled 2018-08-25: qty 1

## 2018-08-25 MED ORDER — BUPROPION HCL ER (XL) 150 MG PO TB24: 150.0000 mg | ORAL_TABLET | Freq: Every day | ORAL | Status: DC

## 2018-08-25 MED ORDER — HYDROXYZINE HCL 25 MG PO TABS: 25.0000 mg | ORAL_TABLET | Freq: Once | ORAL | Status: DC

## 2018-08-25 MED ORDER — MEMANTINE HCL-DONEPEZIL HCL 28-10 MG PO CP24
1.0000 | ORAL_CAPSULE | Freq: Every day | ORAL | Status: DC
Start: 2018-08-25 — End: 2018-08-25

## 2018-08-25 MED ORDER — DONEPEZIL HCL 5 MG PO TABS: 10.0000 mg | ORAL_TABLET | Freq: Every day | ORAL | Status: DC

## 2018-08-25 MED ORDER — MEMANTINE HCL ER 28 MG PO CP24: 28.0000 mg | ORAL_CAPSULE | Freq: Every day | ORAL | Status: DC

## 2018-08-25 NOTE — ED Provider Notes (Addendum)
1:20 PM-I was called to the room because the patient was agitated, and demanding to leave.  She is being observed and evaluated for needs and possible placement.  She was seen by social work earlier, and they are awaiting PT evaluation for assessment of her physical capabilities.  At this time the patient is alert, she is wearing a Posey belt and her left arm is restrained within the Posey belt.  The patient is demanding to leave and states she wants to "drive my car home."  She states that she lives in her home, with her daughter [place].  The patient knows that she is in the hospital but not which one.  She is moderately confused.  I confirmed with the patient's daughter Sharyn Lull, who is a Camera operator, currently working in this emergency department.  She states that the pain lives with her daughter whose name is Tomi Bamberger, and that [place] is the patient's sister who lives in [place].  Sharyn Lull states that the patient cannot be managed in this current state.  They frequently have to get help from the outside to manage her.  Patient's daughter, Tomi Bamberger who lives with the patient, is disabled, and "lives in a chair," and cannot physically help her mother.  2:30 PM-at this time the patient is calmer, and somewhat sleepy.  Extremity exam by me at this time knees have normal range of motion are okay deformity.  There is no visual abnormalities of the lower legs.  Patient's daughter had stated that there was a area of her left lower leg that appeared to be infected.  Nursing still has not obtained labs, will encourage him to the this time since patient appears calmer.  Clinical Course as of Aug 24 1753  Wed Aug 25, 2018  1752 normal  SARS Coronavirus 2 (CEPHEID - Performed in [facility] hospital lab), Operating Room Services Order [EW]  [phone] normal  Urinalysis, Routine w reflex microscopic [EW]  1752 Normal except Potassium is low  Comprehensive metabolic panel(!) [EW]  7741 No infiltrate, or CHF, image reviewed by me   DG Chest Port 1 View [EW]  2878 No fracture or dislocation, images reviewed by me  DG Knee Complete 4 Views Right [EW]  6767 No fracture or dislocation, images reviewed by me  DG Knee Complete 4 Views Left [EW]    Clinical Course User Index [EW] Daleen Bo, MD    Medical decision making-clinically the patient appears to be at her baseline, with mild chronic dementia and behavioral disorder.  EKG done earlier today, is remarkable for mild prolongation of the QT interval.  Laboratory test was ordered, and the patient refused to have these test done at 8 AM.  Her home medicines were ordered.  Patient refused to take her home medicines.  The patient appears to have a behavioral disturbance, and may need to be evaluated and treated by psychiatry.  We will add TTS as a Optometrist, and continue to observe.        Daleen Bo, MD 08/25/18 [phone]

## 2018-08-25 NOTE — ED Notes (Signed)
Pt states that she has no pain. Pt able to lift legs and no complaints of pain when pressure applied to legs. Pt able to ambulate without pain or assistance.

## 2018-08-25 NOTE — ED Notes (Signed)
PATIENT REFUSED LAB COLLECTION

## 2018-08-25 NOTE — Progress Notes (Addendum)
CSW called pt's daughter with an update and pt's daughter is asking for placement.  CSW counseled pt's daughter on SNF's, ALF's and Memory Care, how they work, who is qualified to go there, insurances that do and don't pay for them and how it is paid for otherwise.  CSW will provide pt's daughter with a list of Assisted Living Facilities in the [place] area and counseled pt on legal guardianship and how it is sometimes attained.  Pt's daughter was appreciative and thanked the CSW and stated she would pick up the pt once the EDP clears the lump on the back of the pt's left calf.  CSW called [facility] and confirmed pt is psyche cleared.  Pt's daughter voiced understanding pt is ready to be picked up  RN updated via the EPD.  CSW will continue to follow for D/C needs.  [name]. Romano Stigger, LCSW, LCAS, CSI Transitions of Care Clinical Social Worker Care Coordination Department Ph: [phone]

## 2018-08-25 NOTE — Evaluation (Signed)
Physical Therapy Evaluation Patient Details Name: [name] MRN: [id number] DOB: [birth year] Today's Date: 08/25/2018   History of Present Illness  Pt admitted from home via EMS with c/o L LE pain and inability to ambulate.  Pt with hx of PVD and dementia  Clinical Impression  Pt admitted as above and presenting with no c/o pain and tolerating full WB on L LE.  Pt mobilizing with sup/min guard for safety only.  Based on pt's ongoing dementia and impulsive nature (my car is on the parking lot so Im going to drive myself home), pt would benefit from setting with 24/7 sup/assist to maximize IND and safety.    Follow Up Recommendations No PT follow up;Supervision/Assistance - 24 hour    Equipment Recommendations  None recommended by PT    Recommendations for Other Services       Precautions / Restrictions Precautions Precautions: Fall Restrictions Weight Bearing Restrictions: No      Mobility  Bed Mobility Overal bed mobility: Needs Assistance Bed Mobility: Supine to Sit;Sit to Supine     Supine to sit: Min assist;Supervision Sit to supine: Min assist   General bed mobility comments: min assist to bring legs up onto high bed  Transfers Overall transfer level: Needs assistance Equipment used: None Transfers: Sit to/from Stand Sit to Stand: Supervision         General transfer comment: cues for safety  Ambulation/Gait Ambulation/Gait assistance: Min guard;Supervision Gait Distance (Feet): 200 Feet Assistive device: None Gait Pattern/deviations: Step-through pattern;Decreased step length - right;Decreased step length - left;Shuffle;Trunk flexed Gait velocity: mod pace   General Gait Details: mild instability but no LOB - cement floors and footies - pt states I should have my shoes on  Stairs            Wheelchair Mobility    Modified Rankin (Stroke Patients Only)       Balance Overall balance assessment: Needs assistance Sitting-balance support: No  upper extremity supported;Feet unsupported Sitting balance-Leahy Scale: Good     Standing balance support: No upper extremity supported Standing balance-Leahy Scale: Good                               Pertinent Vitals/Pain Pain Assessment: No/denies pain    Home Living Family/patient expects to be discharged to:: Private residence Living Arrangements: Children Available Help at Discharge: Family Type of Home: House Home Access: Stairs to enter Entrance Stairs-Rails: Can reach both Entrance Stairs-Number of Steps: 3 Home Layout: One level Home Equipment: Environmental consultant - 2 wheels Additional Comments: information taken from previous admit - pt is not reliable historian    Prior Function Level of Independence: Independent with assistive device(s)               Hand Dominance   Dominant Hand: Right    Extremity/Trunk Assessment   Upper Extremity Assessment Upper Extremity Assessment: Overall WFL for tasks assessed    Lower Extremity Assessment Lower Extremity Assessment: Overall WFL for tasks assessed    Cervical / Trunk Assessment Cervical / Trunk Assessment: Kyphotic  Communication   Communication: HOH  Cognition Arousal/Alertness: Awake/alert Behavior During Therapy: Impulsive Overall Cognitive Status: History of cognitive impairments - at baseline                                        General Comments  Exercises     Assessment/Plan    PT Assessment Patient needs continued PT services  PT Problem List Decreased balance;Decreased activity tolerance;Decreased mobility       PT Treatment Interventions DME instruction;Gait training;Functional mobility training;Therapeutic activities;Therapeutic exercise;Patient/family education;Balance training    PT Goals (Current goals can be found in the Care Plan section)  Acute Rehab PT Goals Patient Stated Goal: GET my car and drive home PT Goal Formulation: Patient unable to  participate in goal setting Time For Goal Achievement: 09/08/18 Potential to Achieve Goals: Good    Frequency Min 3X/week   Barriers to discharge Decreased caregiver support dtr pt resides with is WC bound    Co-evaluation               AM-PAC PT "6 Clicks" Mobility  Outcome Measure Help needed turning from your back to your side while in a flat bed without using bedrails?: None Help needed moving from lying on your back to sitting on the side of a flat bed without using bedrails?: A Little Help needed moving to and from a bed to a chair (including a wheelchair)?: A Little Help needed standing up from a chair using your arms (e.g., wheelchair or bedside chair)?: A Little Help needed to walk in hospital room?: A Little Help needed climbing 3-5 steps with a railing? : A Little 6 Click Score: 19    End of Session Equipment Utilized During Treatment: Gait belt Activity Tolerance: Patient tolerated treatment well Patient left: in bed;with call bell/phone within reach;with nursing/sitter in room;with restraints reapplied Nurse Communication: Mobility status PT Visit Diagnosis: Unsteadiness on feet (R26.81)    Time: 8453-6468 PT Time Calculation (min) (ACUTE ONLY): 19 min   Charges:   PT Evaluation $PT Eval Low Complexity: 1 Low          [place] Pager [phone] Office [phone]   Asaph Serena 08/25/2018, 3:12 PM

## 2018-08-25 NOTE — BHH Counselor (Signed)
TTS made 4 attempts to reach anyone at [facility] ED to set up assessment. Will continue to try to reach patient for assessment. Clock stopped due to not being able to start assessment.

## 2018-08-25 NOTE — Discharge Instructions (Addendum)
We saw in the ER for leg pain. We do not see any evidence of fracture, dislocation, infection or swelling which would concern [place] for blood clots.  You have been able to walk. Take Tylenol for pain control.  Follow-up with your primary care doctor for further management as needed

## 2018-08-25 NOTE — ED Triage Notes (Signed)
Per EMS - Pt coming from home, got up to use the bathroom and felt pain in lower left leg. Upon inspection noticed bruising and some swelling. Pt unable to beat weight on left leg.   Hx - Dementia A+OX2; HTN; depression; anxiety   142 86 74 hr 96% RA  16 R

## 2018-08-25 NOTE — BH Assessment (Signed)
Tele Assessment Note   Patient Name: [name] MRN: [id number] Referring Physician: Kathrynn Humble Location of Patient: [facility] Location of Provider: [place] is an 85 y.o. female with a history of Alzheimer's related dementia, presenting voluntarily to [facility] ED complaining of foot pain. Patient's family was also seeking assistance in having her placed in a SNF due to living with her physically disabled daughter. Per EDP patient may have a behavioral disturbance requiring psychiatry. Upon this clinician's assessment patient states "I'm mad as a hornet. There is nothing wrong with me and I want to go home." Patient denies SI/HI/AVH. Patient denies any history of mental illness, substance use, or trauma.   Collateral information was obtained from patient's daughter, Sharyn Lull [phone]): Patient is diagnosed with Alzheimer's. Patient lives with her daughter who can no longer care for patient's due to worsening symptoms. Last night at 1 am she was screaming about her legs hurting. The daughter that she lives with is wheelchair bound and unable to assist her. Patient does not have a history of mental illness, substance use, or suicide attempts.   Disposition: Mordecai Maes, NP recommends patient be psych cleared.  Diagnosis: F02.81 Major Neurocognitive Disorder due to Alzheimer's disease, probable, with behavioral disturbance  Past Medical History:  Past Medical History:  Diagnosis Date  . Abnormality of gait   . Alzheimer's disease ([place])   . Calculus of kidney   . Closed fracture of seventh cervical vertebra without mention of spinal cord injury   . Depressive disorder, not elsewhere classified   . Edema   . Edema   . External hemorrhoids without mention of complication   . Gastric ulcer, unspecified as acute or chronic, without mention of hemorrhage, perforation, or obstruction   . GERD (gastroesophageal reflux disease)   . Hypertension   . Insomnia,  unspecified   . Loss of weight   . Osteoarthrosis, unspecified whether generalized or localized, unspecified site   . Other protein-calorie malnutrition   . Pain in thoracic spine   . Personal history of fall   . Unspecified constipation   . Unspecified hereditary and idiopathic peripheral neuropathy   . Unspecified hypothyroidism   . Unspecified vitamin D deficiency   . Urinary frequency     Past Surgical History:  Procedure Laterality Date  . TEE WITHOUT CARDIOVERSION N/A 11/13/2016   Procedure: TRANSESOPHAGEAL ECHOCARDIOGRAM (TEE);  Surgeon: Jerline Pain, MD;  Location: [facility] ENDOSCOPY;  Service: Cardiovascular;  Laterality: N/A;  . WRIST SURGERY Right 2013    Family History:  Family History  Problem Relation Age of Onset  . Cancer Mother   . CVA Father   . Cancer Brother   . Alcohol abuse Sister     Social History:  reports that she has never smoked. She has never used smokeless tobacco. She reports that she does not drink alcohol or use drugs.  Additional Social History:  Alcohol / Drug Use Pain Medications: see MAR Prescriptions: see MAR Over the Counter: see MAR History of alcohol / drug use?: No history of alcohol / drug abuse  CIWA: CIWA-Ar BP: (!) 148/88 Pulse Rate: 68 COWS:    Allergies: No Known Allergies  Home Medications: (Not in a hospital admission)   OB/GYN Status:  No LMP recorded. Patient is postmenopausal.  General Assessment Data Assessment unable to be completed: Yes Reason for not completing assessment: unable to reach RN Location of Assessment: [facility] ED TTS Assessment: In system Is this a Tele or  Face-to-Face Assessment?: Tele Assessment Is this an Initial Assessment or a Re-assessment for this encounter?: Initial Assessment Patient Accompanied by:: N/A Language Other than English: No Living Arrangements: (home with daughter) What gender do you identify as?: Female Marital status: Single Maiden name: Labat Pregnancy Status: No Living  Arrangements: Children Can pt return to current living arrangement?: Yes Admission Status: Voluntary Is patient capable of signing voluntary admission?: Yes Referral Source: Self/Family/Friend Insurance type: Medicare     Crisis Care Plan Living Arrangements: Children Legal Guardian: (self) Name of Psychiatrist: none Name of Therapist: none  Education Status Is patient currently in school?: No Is the patient employed, unemployed or receiving disability?: Unemployed  Risk to self with the past 6 months Suicidal Ideation: No Has patient been a risk to self within the past 6 months prior to admission? : No Suicidal Intent: No Has patient had any suicidal intent within the past 6 months prior to admission? : No Is patient at risk for suicide?: No Suicidal Plan?: No Has patient had any suicidal plan within the past 6 months prior to admission? : No Access to Means: No What has been your use of drugs/alcohol within the last 12 months?: none Previous Attempts/Gestures: No How many times?: 0 Other Self Harm Risks: none Triggers for Past Attempts: None known Intentional Self Injurious Behavior: None Family Suicide History: No Recent stressful life event(s): Recent negative physical changes Persecutory voices/beliefs?: No Depression: No Depression Symptoms: Feeling angry/irritable Substance abuse history and/or treatment for substance abuse?: No Suicide prevention information given to non-admitted patients: Not applicable  Risk to Others within the past 6 months Homicidal Ideation: No Does patient have any lifetime risk of violence toward others beyond the six months prior to admission? : No Thoughts of Harm to Others: No Current Homicidal Intent: No Current Homicidal Plan: No Access to Homicidal Means: No Identified Victim: none History of harm to others?: No Assessment of Violence: None Noted Violent Behavior Description: none noted Does patient have access to weapons?:  No Criminal Charges Pending?: No Does patient have a court date: No Is patient on probation?: No  Psychosis Hallucinations: None noted Delusions: None noted  Mental Status Report Appearance/Hygiene: In hospital gown Eye Contact: Fair Motor Activity: Unremarkable Speech: Logical/coherent Level of Consciousness: Alert, Irritable Mood: Angry Affect: Angry Anxiety Level: None Thought Processes: Coherent, Relevant Judgement: Impaired Orientation: Person, Place, Time, Situation Obsessive Compulsive Thoughts/Behaviors: None  Cognitive Functioning Concentration: Normal Memory: Recent Intact, Remote Intact Is patient IDD: No Insight: Fair Impulse Control: Poor Appetite: Good Have you had any weight changes? : No Change Sleep: No Change Total Hours of Sleep: 8 Vegetative Symptoms: None  ADLScreening [facility] Assessment Services) Patient's cognitive ability adequate to safely complete daily activities?: No Patient able to express need for assistance with ADLs?: Yes Independently performs ADLs?: No  Prior Inpatient Therapy Prior Inpatient Therapy: No  Prior Outpatient Therapy Prior Outpatient Therapy: No Does patient have an ACCT team?: No Does patient have Intensive In-House Services?  : No Does patient have [facility] services? : No Does patient have P4CC services?: No  ADL Screening (condition at time of admission) Patient's cognitive ability adequate to safely complete daily activities?: No Is the patient deaf or have difficulty hearing?: No Does the patient have difficulty seeing, even when wearing glasses/contacts?: No Does the patient have difficulty concentrating, remembering, or making decisions?: Yes Patient able to express need for assistance with ADLs?: Yes Does the patient have difficulty dressing or bathing?: Yes Independently performs ADLs?: No Does  the patient have difficulty walking or climbing stairs?: Yes Weakness of Legs: Left Weakness of Arms/Hands:  None  Home Assistive Devices/Equipment Home Assistive Devices/Equipment: None  Therapy Consults (therapy consults require a physician order) PT Evaluation Needed: No OT Evalulation Needed: No SLP Evaluation Needed: No       Advance Directives (For Healthcare) Does Patient Have a Medical Advance Directive?: No Would patient like information on creating a medical advance directive?: No - Patient declined          Disposition: Mordecai Maes, NP recommends patient be psych cleared. Disposition Initial Assessment Completed for this Encounter: Yes  This service was provided via telemedicine using a 2-way, interactive audio and video technology.  Names of all persons participating in this telemedicine service and their role in this encounter. Name: [place] Parents Role: patient  Name: Orvis Brill, LCSW Role: TTS  Name:  Role:   Name:  Role:    Orvis Brill 08/25/2018 2:39 PM

## 2018-08-25 NOTE — TOC Initial Note (Signed)
Transition of Care [facility]) - Initial/Assessment Note    Patient Details  Name: [name] MRN: [id number] Date of Birth: 09/26/1931  Transition of Care [facility]) CM/SW Contact:    Janace Hoard, LCSW Phone Number: 08/25/2018, 11:12 AM  Clinical Narrative: CSW spoke with patient's daughter Sharyn Lull. She reports while at home patient will complain on knee and leg pain and "holler out" about not being able to get up. Sharyn Lull reports there has been times when her mother is on the floor for hours and at times can get herself up, family will come assist, or a neighbor will assist. Sharyn Lull reports when patient get to the hospital, she has no complaints of knee or leg pain. Patient current lives with her other daughter who is chair bound. Sharyn Lull is unable to care for her mother due to working. Sharyn Lull reports that someone come to the home in the morning to get patient up and started for the day and at night to feed her dinner. Sharyn Lull reports they can not afford HH due to it being $25 an hour. CSW explained to [place] that we are awaiting PT/OT recommendations before moving forward with disposition plan.                     Expected Discharge Plan: Skilled Nursing Facility Barriers to Discharge: No Barriers Identified   Patient Goals and CMS Choice Patient states their goals for this hospitalization and ongoing recovery are:: To better care for self      Expected Discharge Plan and Services Expected Discharge Plan: [place]       Living arrangements for the past 2 months: Single Family Home                                      Prior Living Arrangements/Services Living arrangements for the past 2 months: Single Family Home Lives with:: Adult Children Patient language and need for interpreter reviewed:: Yes Do you feel safe going back to the place where you live?: Yes      Need for Family Participation in Patient Care: Yes (Comment) Care giver support system in place?:  Yes (comment)   Criminal Activity/Legal Involvement Pertinent to Current Situation/Hospitalization: No - Comment as needed  Activities of Daily Living      Permission Sought/Granted Permission sought to share information with : Family Supports, Chartered certified accountant granted to share information with : Yes, Verbal Permission Granted  Share Information with NAME: Sharyn Lull  Permission granted to share info w AGENCY: SNFs  Permission granted to share info w Relationship: Daughter     Emotional Assessment Appearance:: Other (Comment Required(Spoke with patient's daughter) Attitude/Demeanor/Rapport: Unable to Assess Affect (typically observed): Unable to Assess Orientation: : Fluctuating Orientation (Suspected and/or reported Sundowners)(Patient has dementia) Alcohol / Substance Use: Never Used Psych Involvement: No (comment)  Admission diagnosis:  Leg Pain Patient Active Problem List   Diagnosis Date Noted  . BRBPR (bright red blood per rectum) 06/30/2017  . Colitis 06/30/2017  . Protein-calorie malnutrition, severe 11/13/2016  . Multiple falls   . Encephalopathy   . Agitation   . Slurred speech 11/10/2016  . Vitamin D deficiency 03/07/2015  . Hx of fracture of vertebral column 12/28/2014  . Constipation 07/27/2013  . Fracture of vertebra 07/27/2013  . Loss of weight 07/13/2013  . Peripheral vascular disease, unspecified ([place]) 07/13/2013  . Epigastric mass 07/13/2013  .  Dementia with behavioral disturbance ([place])   . Major depression ([place])   . Insomnia, unspecified   . Fall 09/07/2012  . Fracture of right distal radius 09/07/2012  . Hypothyroidism 08/18/2006  . HYPERCHOLESTEROLEMIA 08/18/2006  . Essential hypertension 08/18/2006  . Restless legs 08/18/2006   PCP:  [name], NP Pharmacy:   [facility] [place] [place]  [id number] Phone: [phone] Fax: [phone]  [facility] - [address] [place] [address] Phone: [phone] Fax: [phone]  [place], [place] - [address] [place] [address] Phone: [phone] Fax: [phone]     Social Determinants of Health (SDOH) Interventions    Readmission Risk Interventions No flowsheet data found.

## 2018-08-25 NOTE — ED Notes (Signed)
Bed: WA24 Expected date:  Expected time:  Means of arrival:  Comments: EMS 

## 2018-08-25 NOTE — ED Provider Notes (Addendum)
[facility] DEPT Provider Note   CSN: [id number] Arrival date & time: 08/25/18  9678    History   Chief Complaint Chief Complaint  Patient presents with  . Leg Injury    HPI [name] is a 85 y.o. female.     HPI  85 year old female comes in a chief complaint of leg injury. According to EMS, patient was going to the bathroom and complained of leg pain, prompting the daughter to call EMS. Patient has dementia, but states that she has no pain and she is unsure why EMS was called.  Patient denies any headache, chest pain, back pain, abdominal pain, UTI-like symptoms, hip pain, leg pain or falls.  I called patient's daughter Ms. Sharyn Lull [name] at [phone], but there was no response.  Past Medical History:  Diagnosis Date  . Abnormality of gait   . Alzheimer's disease ([place])   . Calculus of kidney   . Closed fracture of seventh cervical vertebra without mention of spinal cord injury   . Depressive disorder, not elsewhere classified   . Edema   . Edema   . External hemorrhoids without mention of complication   . Gastric ulcer, unspecified as acute or chronic, without mention of hemorrhage, perforation, or obstruction   . GERD (gastroesophageal reflux disease)   . Hypertension   . Insomnia, unspecified   . Loss of weight   . Osteoarthrosis, unspecified whether generalized or localized, unspecified site   . Other protein-calorie malnutrition   . Pain in thoracic spine   . Personal history of fall   . Unspecified constipation   . Unspecified hereditary and idiopathic peripheral neuropathy   . Unspecified hypothyroidism   . Unspecified vitamin D deficiency   . Urinary frequency     Patient Active Problem List   Diagnosis Date Noted  . BRBPR (bright red blood per rectum) 06/30/2017  . Colitis 06/30/2017  . Protein-calorie malnutrition, severe 11/13/2016  . Multiple falls   . Encephalopathy   . Agitation   . Slurred speech  11/10/2016  . Vitamin D deficiency 03/07/2015  . Hx of fracture of vertebral column 12/28/2014  . Constipation 07/27/2013  . Fracture of vertebra 07/27/2013  . Loss of weight 07/13/2013  . Peripheral vascular disease, unspecified ([place]) 07/13/2013  . Epigastric mass 07/13/2013  . Dementia with behavioral disturbance ([place])   . Major depression ([place])   . Insomnia, unspecified   . Fall 09/07/2012  . Fracture of right distal radius 09/07/2012  . Hypothyroidism 08/18/2006  . HYPERCHOLESTEROLEMIA 08/18/2006  . Essential hypertension 08/18/2006  . Restless legs 08/18/2006    Past Surgical History:  Procedure Laterality Date  . TEE WITHOUT CARDIOVERSION N/A 11/13/2016   Procedure: TRANSESOPHAGEAL ECHOCARDIOGRAM (TEE);  Surgeon: Jerline Pain, MD;  [facility]: [facility] END[facility]COPY;  Service: Cardiovascular;  Laterality: N/A;  . WRIST SURGERY Right 2013     OB History   No obstetric history on file.      Home Medications    Prior to Admission medications   Medication Sig Start Date End Date Taking? Authorizing Provider  acetaminophen (TYLENOL) 325 MG tablet Take 2 tablets (650 mg total) by mouth every 6 (six) hours as needed. Patient taking differently: Take 650 mg by mouth every 6 (six) hours as needed for mild pain.  07/24/17  Yes Jacqlyn Larsen, PA-C  amLODipine (NORVASC) 5 MG tablet Take 1 tablet (5 mg total) by mouth daily. 03/11/18  Yes Ngetich, Dinah C, NP  buPROPion (WELLBUTRIN XL) 150  MG 24 hr tablet TAKE 1 TABLET(150 MG) BY MOUTH DAILY Patient taking differently: Take 150 mg by mouth daily.  08/11/18  Yes [name], NP  levothyroxine (SYNTHROID) 88 MCG tablet TAKE ONE TABLET  ONCE DAILY 30 MINUTES BEFORE BREAKFAST FOR THYROID Patient taking differently: Take 88 mcg by mouth daily before breakfast.  08/11/18  Yes [name], NP  Memantine HCl-Donepezil HCl (NAMZARIC) 28-10 MG CP24 Take 1 capsule by mouth daily. 08/11/18  Yes [name], NP  venlafaxine XR  (EFFEXOR-XR) 75 MG 24 hr capsule TAKE 1 CAPSULE EVERY DAY WITH BREAKFAST Patient taking differently: Take 75 mg by mouth at bedtime.  03/11/18  Yes Ngetich, Dinah C, NP  Vitamin D, Ergocalciferol, (DRISDOL) 1.25 MG (50000 UT) CAPS capsule Take one capsule by mouth every 14 days Patient taking differently: Take 50,000 Units by mouth every 14 (fourteen) days.  03/11/18  Yes Ngetich, Dinah C, NP    Family History Family History  Problem Relation Age of Onset  . Cancer Mother   . CVA Father   . Cancer Brother   . Alcohol abuse Sister     Social History Social History   Tobacco Use  . Smoking status: Never Smoker  . Smokeless tobacco: Never Used  Substance Use Topics  . Alcohol use: No  . Drug use: No     Allergies   Patient has no known allergies.   Review of Systems Review of Systems  Constitutional: Negative for activity change.  Gastrointestinal: Negative for abdominal pain.  Musculoskeletal: Negative for arthralgias and myalgias.  Neurological: Negative for numbness.     Physical Exam Updated Vital Signs BP (!) 142/111   Pulse 90   Temp 97.7 F (36.5 C) (Oral)   Resp 16   SpO2 96%   Physical Exam Vitals signs and nursing note reviewed.  Constitutional:      Appearance: She is well-developed.  HENT:     Head: Atraumatic.  Neck:     Musculoskeletal: Normal range of motion and neck supple.  Cardiovascular:     Rate and Rhythm: Normal rate.  Pulmonary:     Effort: Pulmonary effort is normal.  Abdominal:     General: Bowel sounds are normal.  Musculoskeletal:        General: No swelling, tenderness or deformity.     Comments: Range of motion over both the hips is normal.  Patient is able to flex and extend over the knee and also kick out without significant difficulties  Skin:    General: Skin is warm and dry.  Neurological:     Mental Status: She is alert. Mental status is at baseline.      ED Treatments / Results  Labs (all labs ordered are  listed, but only abnormal results are displayed) Labs Reviewed  COMPREHENSIVE METABOLIC PANEL - Abnormal; Notable for the following components:      Result Value   Potassium 3.0 (*)    All other components within normal limits  CBC WITH DIFFERENTIAL/PLATELET - Abnormal; Notable for the following components:   Hemoglobin 16.0 (*)    HCT 46.1 (*)    MCV 102.9 (*)    MCH 35.7 (*)    All other components within normal limits  SARS CORONAVIRUS 2 (HOSPITAL ORDER, [facility])  URINALYSIS, ROUTINE W REFLEX MICROSCOPIC    EKG EKG Interpretation  Date/Time:  Wednesday August 25 2018 08:01:41 EDT Ventricular Rate:  71 PR Interval:    QRS  Duration: 111 QT Interval:  468 QTC Calculation: 509 R Axis:   34 Text Interpretation:  Sinus rhythm Abnormal R-wave progression, early transition Abnormal inferior Q waves Minimal ST depression, lateral leads Prolonged QT interval Since last tracing QT has lengthened Confirmed by Daleen Bo [phone]) on 08/25/2018 1:28:29 PM   Radiology Dg Chest Port 1 View  Result Date: 08/25/2018 CLINICAL DATA:  Dementia. EXAM: PORTABLE CHEST 1 VIEW COMPARISON:  Radiographs of March 26, 2018. FINDINGS: The heart size and mediastinal contours are within normal limits. Atherosclerosis of thoracic aorta is noted. No pneumothorax or pleural effusion is noted. Minimal bibasilar subsegmental atelectasis is noted. The visualized skeletal structures are unremarkable. IMPRESSION: Minimal bibasilar subsegmental atelectasis. Aortic Atherosclerosis (ICD10-I70.0). Electronically Signed   By: Marijo Conception M.D.   On: 08/25/2018 07:18   Dg Knee Complete 4 Views Left  Result Date: 08/25/2018 CLINICAL DATA:  Knee pain EXAM: LEFT KNEE - COMPLETE 4+ VIEW; RIGHT KNEE - COMPLETE 4+ VIEW COMPARISON:  None. FINDINGS: Osteopenia. No fracture or dislocation of the bilateral knees. Joint spaces are well preserved for age. No knee joint effusion. IMPRESSION: Osteopenia.  No fracture or dislocation of the bilateral knees. Joint spaces are well preserved for age. No knee joint effusion. Electronically Signed   By: Eddie Candle M.D.   On: 08/25/2018 15:26   Dg Knee Complete 4 Views Right  Result Date: 08/25/2018 CLINICAL DATA:  Knee pain EXAM: LEFT KNEE - COMPLETE 4+ VIEW; RIGHT KNEE - COMPLETE 4+ VIEW COMPARISON:  None. FINDINGS: Osteopenia. No fracture or dislocation of the bilateral knees. Joint spaces are well preserved for age. No knee joint effusion. IMPRESSION: Osteopenia. No fracture or dislocation of the bilateral knees. Joint spaces are well preserved for age. No knee joint effusion. Electronically Signed   By: Eddie Candle M.D.   On: 08/25/2018 15:26    Procedures Procedures (including critical care time)  Medications Ordered in ED Medications - No data to display   Initial Impression / Assessment and Plan / ED Course  I have reviewed the triage vital signs and the nursing notes.  Pertinent labs & imaging results that were available during my care of the patient were reviewed by me and considered in my medical decision making (see chart for details).  Clinical Course as of Aug 26 7  Wed Aug 25, 2018  1752 normal  SARS Coronavirus 2 (CEPHEID - Performed in [facility] hospital lab), [facility] Order [EW]  [phone] normal  Urinalysis, Routine w reflex microscopic [EW]  1752 Normal except Potassium is low  Comprehensive metabolic panel(!) [EW]  4098 No infiltrate, or CHF, image reviewed by me  DG Chest Port 1 View [EW]  1754 No fracture or dislocation, images reviewed by me  DG Knee Complete 4 Views Right [EW]  1191 No fracture or dislocation, images reviewed by me  DG Knee Complete 4 Views Left [EW]    Clinical Course User Index [EW] Daleen Bo, MD       85 year old sent to the ER for leg pain. Patient has no complaints.  She has dementia.  I called patient's daughter, who allegedly called EMS, but there was no response.  Patient is able to  move her extremities freely.  We will ambulate her.  No imaging indicated.  No signs of infection.  12:09 AM I got in touch with patient's daughter, who actually works in our [place].  Ms. [name] informed me that patient get up and started writhing in pain and was unable  to stand up.  She also has been rolling around on the floor.  She has dementia and her symptoms have progressed.  She has become very difficult to manage at home.  Patient normally stays with another daughter, who is disabled herself.  We will consult case management and social worker. It seems like there is also hospice involvement.  Perhaps patient can get at least some more home health, if we are unable to place her.  Final Clinical Impressions(s) / ED Diagnoses   Final diagnoses:  Left leg pain  Dementia [facility])    ED Discharge Orders    None       Varney Biles, MD 08/25/18 3578    Varney Biles, MD 08/25/18 9784    Varney Biles, MD 08/26/18 0009

## 2018-09-06 ENCOUNTER — Other Ambulatory Visit: Payer: Self-pay | Admitting: Family

## 2018-09-06 DIAGNOSIS — F332 Major depressive disorder, recurrent severe without psychotic features: Secondary | ICD-10-CM

## 2018-09-06 NOTE — Telephone Encounter (Signed)
Okay to give a 30 day supply but will need follow up appt before any additional refills will be given.

## 2018-09-06 NOTE — Telephone Encounter (Signed)
Patient is requesting refill for medication but noticed she has no future appointments and has not been seen since 03/26/18 by Dinah and 08/12/2017 by Sherrie Mustache. Routing to provider for review.

## 2018-09-29 ENCOUNTER — Other Ambulatory Visit: Payer: Self-pay | Admitting: Nurse Practitioner

## 2018-09-29 DIAGNOSIS — F332 Major depressive disorder, recurrent severe without psychotic features: Secondary | ICD-10-CM

## 2018-09-29 NOTE — Telephone Encounter (Signed)
I called patient's daughter and informed her that patient is overdue for an appointment.     Appointment scheduled for next wee. I stressed the importance of keeping appointment.

## 2018-10-05 ENCOUNTER — Encounter: Payer: Self-pay | Admitting: Nurse Practitioner

## 2018-10-05 ENCOUNTER — Ambulatory Visit (INDEPENDENT_AMBULATORY_CARE_PROVIDER_SITE_OTHER): Payer: Medicare HMO | Admitting: Nurse Practitioner

## 2018-10-05 ENCOUNTER — Other Ambulatory Visit: Payer: Self-pay

## 2018-10-05 VITALS — BP 122/78 | HR 85 | Temp 98.4°F | Ht 64.0 in | Wt 131.0 lb

## 2018-10-05 DIAGNOSIS — F332 Major depressive disorder, recurrent severe without psychotic features: Secondary | ICD-10-CM | POA: Diagnosis not present

## 2018-10-05 DIAGNOSIS — I10 Essential (primary) hypertension: Secondary | ICD-10-CM | POA: Diagnosis not present

## 2018-10-05 DIAGNOSIS — T148XXA Other injury of unspecified body region, initial encounter: Secondary | ICD-10-CM

## 2018-10-05 DIAGNOSIS — Z7189 Other specified counseling: Secondary | ICD-10-CM | POA: Diagnosis not present

## 2018-10-05 DIAGNOSIS — E559 Vitamin D deficiency, unspecified: Secondary | ICD-10-CM | POA: Diagnosis not present

## 2018-10-05 DIAGNOSIS — G301 Alzheimer's disease with late onset: Secondary | ICD-10-CM

## 2018-10-05 DIAGNOSIS — E039 Hypothyroidism, unspecified: Secondary | ICD-10-CM | POA: Diagnosis not present

## 2018-10-05 DIAGNOSIS — I5030 Unspecified diastolic (congestive) heart failure: Secondary | ICD-10-CM | POA: Diagnosis not present

## 2018-10-05 DIAGNOSIS — E876 Hypokalemia: Secondary | ICD-10-CM

## 2018-10-05 DIAGNOSIS — F0281 Dementia in other diseases classified elsewhere with behavioral disturbance: Secondary | ICD-10-CM | POA: Diagnosis not present

## 2018-10-05 DIAGNOSIS — F02818 Dementia in other diseases classified elsewhere, unspecified severity, with other behavioral disturbance: Secondary | ICD-10-CM

## 2018-10-05 NOTE — Progress Notes (Signed)
Careteam: Patient Care Team: [name], NP as PCP - General (Geriatric Medicine) Berneta Sages, NP as Nurse Practitioner [facility] and Palliative Medicine)  Advanced Directive information Does Patient Have a Medical Advance Directive?: No, Does patient want to make changes to medical advance directive?: Yes (MAU/Ambulatory/Procedural Areas - Information given)  No Known Allergies  Chief Complaint  Patient presents with  . Medical Management of Chronic Issues    12 month follow-up, examine both legs, patient with frequent falls (couple times daily). Patient has assisted devices at home but not using. Patient is agitated at night and tries to elope. Decrease appetite.  High fall risk. Here with daughter.   . Immunizations    High dose flu vaccine not available at [facility]   . Medication Management    Out of Vit D 50,000 x 2 months   . Advanced Directive    Advance Care Planning, discuss DNA and HCPOA vs Durable POA      HPI: Patient is a 85 y.o. female seen in the office today for routine follow up.  She recently went to ED after fall and with leg injury. Family was also concerned for cellulitis but exam did not reveal that. Labs were unremarkable except hypokalemia noted. There was no injuries noted from fall. Pt with frequent falls and lives alone with disabled daughter. They have put a dead bolt on the door but she gets enraged  and tries to bust the door open. There was question if she needed placement in facility for safety but daughter states no one would take her and price was an issue. She did not meet criteria to be admitted to SNF due to physical decline and debility.   There was a case management and social worker consult placed which helped her.  She also has palliative care but it looks like there has not been much in home involvement likely due to [place].   Daughter reports she has been very unhappy in the last month.  Same routine, same help.  Continues on effexor  75 mg and wellbutrin  by mouth daily   htn- continues on norvasc 5 mg daily  Hypothyroid- on synthroid 88 mcg  Ran out of vit D supplement.    Review of Systems:  Review of Systems  Unable to perform ROS: Dementia    Past Medical History:  Diagnosis Date  . Abnormality of gait   . Alzheimer's disease ([place])   . Calculus of kidney   . Closed fracture of seventh cervical vertebra without mention of spinal cord injury   . Depressive disorder, not elsewhere classified   . Edema   . Edema   . External hemorrhoids without mention of complication   . Gastric ulcer, unspecified as acute or chronic, without mention of hemorrhage, perforation, or obstruction   . GERD (gastroesophageal reflux disease)   . Hypertension   . Insomnia, unspecified   . Loss of weight   . Osteoarthrosis, unspecified whether generalized or localized, unspecified site   . Other protein-calorie malnutrition   . Pain in thoracic spine   . Personal history of fall   . Unspecified constipation   . Unspecified hereditary and idiopathic peripheral neuropathy   . Unspecified hypothyroidism   . Unspecified vitamin D deficiency   . Urinary frequency    Past Surgical History:  Procedure Laterality Date  . TEE WITHOUT CARDIOVERSION N/A 11/13/2016   Procedure: TRANSESOPHAGEAL ECHOCARDIOGRAM (TEE);  Surgeon: Jerline Pain, MD;  [facility] ENDOSCOPY;  Service: Cardiovascular;  Laterality: N/A;  . WRIST SURGERY Right 2013   Social History:   reports that she has never smoked. She has never used smokeless tobacco. She reports that she does not drink alcohol or use drugs.  Family History  Problem Relation Age of Onset  . Cancer Mother   . CVA Father   . Cancer Brother   . Alcohol abuse Sister     Medications: Patient's Medications  New Prescriptions   No medications on file  Previous Medications   ACETAMINOPHEN (TYLENOL) 325 MG TABLET    Take 2 tablets (650 mg total) by mouth every 6 (six) hours as needed.    AMLODIPINE (NORVASC) 5 MG TABLET    Take 1 tablet (5 mg total) by mouth daily.   BUPROPION (WELLBUTRIN XL) 150 MG 24 HR TABLET    TAKE 1 TABLET(150 MG) BY MOUTH DAILY   LEVOTHYROXINE (SYNTHROID) 88 MCG TABLET    TAKE ONE TABLET  ONCE DAILY 30 MINUTES BEFORE BREAKFAST FOR THYROID   MEMANTINE HCL-DONEPEZIL HCL (NAMZARIC) 28-10 MG CP24    Take 1 capsule by mouth daily.   VENLAFAXINE XR (EFFEXOR-XR) 75 MG 24 HR CAPSULE    TAKE 1 CAPSULE EVERY DAY WITH BREAKFAST (CONTACT [facility] TO SCHEDULE AN APPOINTMENT BEFORE ANY MORE REFILLS)   VITAMIN D, ERGOCALCIFEROL, (DRISDOL) 1.25 MG (50000 UT) CAPS CAPSULE    Take one capsule by mouth every 14 days  Modified Medications   No medications on file  Discontinued Medications   No medications on file    Physical Exam:  Vitals:   10/05/18 1535  BP: 122/78  Pulse: 85  Temp: 98.4 F (36.9 C)  TempSrc: Oral  SpO2: 93%  Weight: 131 lb (59.4 kg)  Height: 5' 4"  (1.626 m)   Body mass index is 22.49 kg/m. Wt Readings from Last 3 Encounters:  10/05/18 131 lb (59.4 kg)  03/26/18 134 lb 3.2 oz (60.9 kg)  03/11/18 134 lb 6.4 oz (61 kg)    Physical Exam Constitutional:      Appearance: Normal appearance. She is normal weight.  HENT:     Head: Normocephalic and atraumatic.  Eyes:     Extraocular Movements: Extraocular movements intact.     Pupils: Pupils are equal, round, and reactive to light.  Cardiovascular:     Rate and Rhythm: Normal rate and regular rhythm.  Pulmonary:     Effort: Pulmonary effort is normal.     Breath sounds: Normal breath sounds.  Abdominal:     General: Bowel sounds are normal.     Palpations: Abdomen is soft.     Tenderness: There is no abdominal tenderness.     Comments: Rounded abdomen, soft, nontender  Musculoskeletal:     Right lower leg: Edema (trace) present.     Left lower leg: Edema (trace) present.  Skin:    General: Skin is warm and dry.     Findings: Bruising (noted to bilateral shins in different stages of  healing) present.     Comments: Abrasion noted to right anterior lower leg with scab, slight erythema noted without warmth or drainage (pt refuses to cover)  Neurological:     Mental Status: She is alert. She is disoriented.     Motor: Weakness present.     Gait: Gait abnormal.  Psychiatric:        Cognition and Memory: Cognition is impaired. Memory is impaired.     Labs reviewed: Basic Metabolic Panel: Recent Labs    03/26/18  1510 05/19/18 1657 08/25/18 1449  NA 143 140 141  K 4.4 3.9 3.0*  CL 104 105 104  CO2 29 24 26   GLUCOSE 79 92 95  BUN 18 19 13   CREATININE 1.08* 0.88 0.77  CALCIUM 9.8 9.0 9.4   Liver Function Tests: Recent Labs    03/26/18 1510 05/19/18 1657 08/25/18 1449  AST 19 27 27   ALT 17 21 22   ALKPHOS  --  98 95  BILITOT 0.5 0.8 0.8  PROT 7.0 7.7 7.8  ALBUMIN  --  4.4 4.6   No results for input(s): LIPASE, AMYLASE in the last 8760 hours. No results for input(s): AMMONIA in the last 8760 hours. CBC: Recent Labs    03/26/18 1510 05/19/18 1657 08/25/18 1449  WBC 6.8 8.6 7.0  NEUTROABS 4,461 5.8 4.8  HGB 14.8 14.6 16.0*  HCT 42.1 44.1 46.1*  MCV 99.8 105.3* 102.9*  PLT 250 226 227   Lipid Panel: No results for input(s): CHOL, HDL, LDLCALC, TRIG, CHOLHDL, LDLDIRECT in the last 8760 hours. TSH: No results for input(s): TSH in the last 8760 hours. A1C: Lab Results  Component Value Date   HGBA1C 5.2 11/11/2016     Assessment/Plan 1. Advanced care planning/counseling discussion -MOST form completed and DNR signed. She had one previously but likes to throw things away per daughter. - DNR (Do Not Resuscitate)  2. Essential hypertension Controlled on Norvasc 5 mg daily - BMP with eGFR([facility])  3. Abrasion -multiple falls and will not use assisted devices due to dementia, to stop using peroxide to abrasion, pt refuses to keep covered. To have family keep clean and apply A&D ointment daily and as needed. To notify if redness worsens, warmth  or drainage occurs. - CBC with Differential/Platelet  4. Late onset Alzheimer's disease with behavioral disturbance ([place]) Progressive decline with fall and agitation noted. Family has tired to have more people come in to help but pt gets exteremly agitated. Can not move into facility due to cost at this time and reports she was not accepted based on needed therapy even though she was having falls. Continues to have palliative care following. Decreased appetite which has been ongoing and likely will worsen with progression.  -continues on namzaric daily   5. Severe episode of recurrent major depressive disorder, without psychotic features ([place]) -worsening mood recently. Will follow up labs at this time.to continue Effexor. Encouraged to take Wellbutrin during the day as it can be stimulating.   6. Hypothyroidism, unspecified type -continues on synthroid 88 mcg - TSH  7. Vitamin D deficiency -has been out of vit D for the last few weeks.  - Vitamin D, 25-hydroxy  8. Hypokalemia -noted from Ed, will follow up today.  Next appt: 6 months, sooner if needed [place] [facility] [place], [facility] [phone]

## 2018-10-05 NOTE — Patient Instructions (Addendum)
Take Wellbutrin in the morning  Will follow up Vit D level.   To use vit A&D ointment to abrasion on left left, avoid peroxide  To notify us if redness worsens or gets bigger, drainage occurs or fever

## 2018-10-06 ENCOUNTER — Other Ambulatory Visit: Payer: Self-pay | Admitting: *Deleted

## 2018-10-06 ENCOUNTER — Other Ambulatory Visit: Payer: Self-pay | Admitting: Nurse Practitioner

## 2018-10-06 LAB — CBC WITH DIFFERENTIAL/PLATELET
Absolute Monocytes: 525 cells/uL (ref 200–950)
Basophils Absolute: 38 cells/uL (ref 0–200)
Basophils Relative: 0.6 %
Eosinophils Absolute: 141 cells/uL (ref 15–500)
Eosinophils Relative: 2.2 %
HCT: 41.6 % (ref 35.0–45.0)
Hemoglobin: 14.1 g/dL (ref 11.7–15.5)
Lymphs Abs: 1734 cells/uL (ref 850–3900)
MCH: 34.4 pg — ABNORMAL HIGH (ref 27.0–33.0)
MCHC: 33.9 g/dL (ref 32.0–36.0)
MCV: 101.5 fL — ABNORMAL HIGH (ref 80.0–100.0)
MPV: 9.4 fL (ref 7.5–12.5)
Monocytes Relative: 8.2 %
Neutro Abs: 3962 cells/uL (ref 1500–7800)
Neutrophils Relative %: 61.9 %
Platelets: 262 10*3/uL (ref 140–400)
RBC: 4.1 10*6/uL (ref 3.80–5.10)
RDW: 11.9 % (ref 11.0–15.0)
Total Lymphocyte: 27.1 %
WBC: 6.4 10*3/uL (ref 3.8–10.8)

## 2018-10-06 LAB — VITAMIN D 25 HYDROXY (VIT D DEFICIENCY, FRACTURES): Vit D, 25-Hydroxy: 31 ng/mL (ref 30–100)

## 2018-10-06 LAB — BASIC METABOLIC PANEL WITH GFR
BUN/Creatinine Ratio: 17 (calc) (ref 6–22)
BUN: 18 mg/dL (ref 7–25)
CO2: 29 mmol/L (ref 20–32)
Calcium: 9.5 mg/dL (ref 8.6–10.4)
Chloride: 102 mmol/L (ref 98–110)
Creat: 1.03 mg/dL — ABNORMAL HIGH (ref 0.60–0.88)
GFR, Est African American: 57 mL/min/{1.73_m2} — ABNORMAL LOW (ref >=60)
GFR, Est Non African American: 50 mL/min/{1.73_m2} — ABNORMAL LOW (ref >=60)
Glucose, Bld: 95 mg/dL (ref 65–139)
Potassium: 3.5 mmol/L (ref 3.5–5.3)
Sodium: 141 mmol/L (ref 135–146)

## 2018-10-06 LAB — TSH: TSH: 0.78 mIU/L (ref 0.40–4.50)

## 2018-10-06 MED ORDER — VITAMIN D (ERGOCALCIFEROL) 1.25 MG (50000 UNIT) PO CAPS: ORAL_CAPSULE | ORAL | 1 refills | Status: DC

## 2018-10-06 NOTE — Telephone Encounter (Signed)
Caregiver called and stated that patient was seen yesterday and Vitamin D was sent to [facility] and they wanted it to go to [facility].  Faxed.

## 2018-10-08 ENCOUNTER — Other Ambulatory Visit: Payer: Self-pay

## 2018-10-08 DIAGNOSIS — F332 Major depressive disorder, recurrent severe without psychotic features: Secondary | ICD-10-CM

## 2018-10-08 MED ORDER — VENLAFAXINE HCL ER 75 MG PO CP24: ORAL_CAPSULE | ORAL | 1 refills | Status: DC

## 2018-10-15 ENCOUNTER — Other Ambulatory Visit: Payer: Self-pay

## 2018-10-15 ENCOUNTER — Ambulatory Visit (INDEPENDENT_AMBULATORY_CARE_PROVIDER_SITE_OTHER): Payer: Medicare HMO | Admitting: Nurse Practitioner

## 2018-10-15 ENCOUNTER — Encounter: Payer: Self-pay | Admitting: Nurse Practitioner

## 2018-10-15 DIAGNOSIS — L089 Local infection of the skin and subcutaneous tissue, unspecified: Secondary | ICD-10-CM | POA: Diagnosis not present

## 2018-10-15 DIAGNOSIS — T148XXA Other injury of unspecified body region, initial encounter: Secondary | ICD-10-CM | POA: Diagnosis not present

## 2018-10-15 MED ORDER — DOXYCYCLINE HYCLATE 100 MG PO TABS: 100.0000 mg | ORAL_TABLET | Freq: Two times a day (BID) | ORAL | 0 refills | Status: DC

## 2018-10-15 NOTE — Progress Notes (Signed)
This service is provided via telemedicine  No vital signs collected/recorded due to the encounter was a telemedicine visit.   Location of patient (ex: home, work):  Home  Patient consents to a telephone visit:  Yes  Location of the provider (ex: office, home):  Office  Name of any referring provider:  Sherrie Mustache, NP  Names of all persons participating in the telemedicine service and their role in the encounter:  Thurston Pounds, daughter; Sherrie Mustache, NP; Bonney Leitz, [place]  Time spent on call:  4:11     Careteam: Patient Care Team: [name], NP as PCP - General (Geriatric Medicine) Berneta Sages, NP as Nurse Practitioner (Hospice and Palliative Medicine)  Advanced Directive information Does Patient Have a Medical Advance Directive?: Yes, Type of Advance Directive: Out of facility DNR (pink MOST or yellow form);Living will, Pre-existing out of facility DNR order (yellow form or pink MOST form): Yellow form placed in chart (order not valid for inpatient use);Pink MOST form placed in chart (order not valid for inpatient use), Does patient want to make changes to medical advance directive?: No - Guardian declined  No Known Allergies  Chief Complaint  Patient presents with  . Follow-up    Daughter concerned about worsening bruising on bilateral legs.      HPI: Patient is a 85 y.o. female for tele-visit.  Daughter helping with televisit today- she is chair bound and can not bring her in to the office (other daughter is out of town) Reports they have been watching areas on her legs. States there is some fluid under the skin. It has become slightly more red and warm to touch. Areas were open and have now scabbed over and Pt is bad to pick at scabs.  No purulent drainage. Sometimes there will be blood when she picks at the scab. Does not wash her hands despite education. In the neighbors trash cans.  They have tired to put dressing on legs but she takes all  dressing off. Hates all bandaged.  Concerned over redness, warmth and swelling that has gotten worse.  Will not elevate legs.  Not complaining of pain but tender if touched/hit. Ongoing falls.  No fevers or increase in confusion, no changes in mental status.   Review of Systems:  Review of Systems  Unable to perform ROS: Dementia    Past Medical History:  Diagnosis Date  . Abnormality of gait   . Alzheimer's disease ([place])   . Calculus of kidney   . Closed fracture of seventh cervical vertebra without mention of spinal cord injury   . Depressive disorder, not elsewhere classified   . Edema   . Edema   . External hemorrhoids without mention of complication   . Gastric ulcer, unspecified as acute or chronic, without mention of hemorrhage, perforation, or obstruction   . GERD (gastroesophageal reflux disease)   . Hypertension   . Insomnia, unspecified   . Loss of weight   . Osteoarthrosis, unspecified whether generalized or localized, unspecified site   . Other protein-calorie malnutrition   . Pain in thoracic spine   . Personal history of fall   . Unspecified constipation   . Unspecified hereditary and idiopathic peripheral neuropathy   . Unspecified hypothyroidism   . Unspecified vitamin D deficiency   . Urinary frequency    Past Surgical History:  Procedure Laterality Date  . TEE WITHOUT CARDIOVERSION N/A 11/13/2016   Procedure: TRANSESOPHAGEAL ECHOCARDIOGRAM (TEE);  Surgeon: Jerline Pain, MD;  Location: [facility] ENDOSCOPY;  Service: Cardiovascular;  Laterality: N/A;  . WRIST SURGERY Right 2013   Social History:   reports that she has never smoked. She has never used smokeless tobacco. She reports that she does not drink alcohol or use drugs.  Family History  Problem Relation Age of Onset  . Cancer Mother   . CVA Father   . Cancer Brother   . Alcohol abuse Sister     Medications: Patient's Medications  New Prescriptions   No medications on file  Previous  Medications   ACETAMINOPHEN (TYLENOL) 325 MG TABLET    Take 2 tablets (650 mg total) by mouth every 6 (six) hours as needed.   AMLODIPINE (NORVASC) 5 MG TABLET    Take 1 tablet (5 mg total) by mouth daily.   BUPROPION (WELLBUTRIN XL) 150 MG 24 HR TABLET    TAKE 1 TABLET(150 MG) BY MOUTH DAILY   LEVOTHYROXINE (SYNTHROID) 88 MCG TABLET    TAKE ONE TABLET  ONCE DAILY 30 MINUTES BEFORE BREAKFAST FOR THYROID   MEMANTINE HCL-DONEPEZIL HCL (NAMZARIC) 28-10 MG CP24    Take 1 capsule by mouth daily.   VENLAFAXINE XR (EFFEXOR-XR) 75 MG 24 HR CAPSULE    TAKE 1 CAPSULE EVERY DAY WITH BREAKFAST   VITAMIN D, ERGOCALCIFEROL, (DRISDOL) 1.25 MG (50000 UT) CAPS CAPSULE    Take one capsule by mouth every 14 days  Modified Medications   No medications on file  Discontinued Medications   No medications on file    Physical Exam:  There were no vitals filed for this visit. There is no height or weight on file to calculate BMI. Wt Readings from Last 3 Encounters:  10/05/18 131 lb (59.4 kg)  03/26/18 134 lb 3.2 oz (60.9 kg)  03/11/18 134 lb 6.4 oz (61 kg)   Labs reviewed: Basic Metabolic Panel: Recent Labs    05/19/18 1657 08/25/18 1449 10/05/18 1619  NA 140 141 141  K 3.9 3.0* 3.5  CL 105 104 102  CO2 24 26 29   GLUCOSE 92 95 95  BUN 19 13 18   CREATININE 0.88 0.77 1.03*  CALCIUM 9.0 9.4 9.5  TSH  --   --  0.78   Liver Function Tests: Recent Labs    03/26/18 1510 05/19/18 1657 08/25/18 1449  AST 19 27 27   ALT 17 21 22   ALKPHOS  --  98 95  BILITOT 0.5 0.8 0.8  PROT 7.0 7.7 7.8  ALBUMIN  --  4.4 4.6   No results for input(s): LIPASE, AMYLASE in the last 8760 hours. No results for input(s): AMMONIA in the last 8760 hours. CBC: Recent Labs    05/19/18 1657 08/25/18 1449 10/05/18 1619  WBC 8.6 7.0 6.4  NEUTROABS 5.8 4.8 3,962  HGB 14.6 16.0* 14.1  HCT 44.1 46.1* 41.6  MCV 105.3* 102.9* 101.5*  PLT 226 227 262   Lipid Panel: No results for input(s): CHOL, HDL, LDLCALC, TRIG,  CHOLHDL, LDLDIRECT in the last 8760 hours. TSH: Recent Labs    10/05/18 1619  TSH 0.78   A1C: Lab Results  Component Value Date   HGBA1C 5.2 11/11/2016     Assessment/Plan 1. Infected skin tear -pt will not allow for proper care of skin tear, picking at scab now with worsening redness, warmth and heat. High risk of infection due to poor hygiene and not allowing for proper care. Will go ahead and treat and have her seen in office for  follow up next week.  -to continue  to encourage pt to avoid picking area and to allow for cleaning (rinsing with mild soap and water when able)  - doxycycline (VIBRA-TABS) 100 MG tablet; Take 1 tablet (100 mg total) by mouth 2 (two) times daily.  Dispense: 14 tablet; Refill: 0 -strict precautions given to go to urgent care or ED if worsening redness, purulent drainage, fever or pain occurs.   Next appt: 1 week for follow up.  Carlos American. [place], [facility] [phone]    Virtual Visit via Telephone Note  I connected with pt and daughter on 10/15/18 at  3:15 PM EDT by telephone and verified that I am speaking with the correct person using two identifiers.  Location: Patient: home Provider: office   I discussed the limitations, risks, security and privacy concerns of performing an evaluation and management service by telephone and the availability of in person appointments. I also discussed with the patient that there may be a patient responsible charge related to this service. The patient expressed understanding and agreed to proceed.   I discussed the assessment and treatment plan with the patient. The patient was provided an opportunity to ask questions and all were answered. The patient agreed with the plan and demonstrated an understanding of the instructions.   The patient was advised to call back or seek an in-person evaluation if the symptoms worsen or if the condition fails to improve as anticipated.  I  provided 13 minutes of non-face-to-face time during this encounter.  Carlos American. [facility] printed and mailed

## 2018-10-18 ENCOUNTER — Ambulatory Visit (INDEPENDENT_AMBULATORY_CARE_PROVIDER_SITE_OTHER): Payer: Medicare HMO | Admitting: Nurse Practitioner

## 2018-10-18 ENCOUNTER — Other Ambulatory Visit: Payer: Self-pay

## 2018-10-18 VITALS — BP 138/84 | HR 84 | Temp 97.5°F | Ht 64.0 in | Wt 131.0 lb

## 2018-10-18 DIAGNOSIS — T148XXA Other injury of unspecified body region, initial encounter: Secondary | ICD-10-CM | POA: Diagnosis not present

## 2018-10-18 DIAGNOSIS — L089 Local infection of the skin and subcutaneous tissue, unspecified: Secondary | ICD-10-CM

## 2018-10-18 DIAGNOSIS — L03116 Cellulitis of left lower limb: Secondary | ICD-10-CM | POA: Diagnosis not present

## 2018-10-18 MED ORDER — DOXYCYCLINE HYCLATE 100 MG PO TABS: 100.0000 mg | ORAL_TABLET | Freq: Two times a day (BID) | ORAL | 0 refills | Status: DC

## 2018-10-18 NOTE — Progress Notes (Signed)
Careteam: Patient Care Team: [name], NP as PCP - General (Geriatric Medicine) Berneta Sages, NP as Nurse Practitioner [facility] [place] and Palliative Medicine)  Advanced Directive information    No Known Allergies  Chief Complaint  Patient presents with  . Acute Visit    Examine sling tear and legs. High fall risk. Here with daughter   . Immunizations    Discuss need for flu vaccine, patient is currently on an ABX      HPI: Patient is a 85 y.o. female seen in the office today to follow up leg redness and evaluate new skin tear.  She had another fall 4 days ago with skin tear.  Hit hinges from a dresser that they removed the door from.  Put A&D ointment on area and attempted to cover it. No drainage noted to area. She continues to take dressing off to look at area.  Does not wash hands Picks and rubs at leg.   Review of Systems:  Review of Systems  Unable to perform ROS: Dementia    Past Medical History:  Diagnosis Date  . Abnormality of gait   . Alzheimer's disease ([place])   . Calculus of kidney   . Closed fracture of seventh cervical vertebra without mention of spinal cord injury   . Depressive disorder, not elsewhere classified   . Edema   . Edema   . External hemorrhoids without mention of complication   . Gastric ulcer, unspecified as acute or chronic, without mention of hemorrhage, perforation, or obstruction   . GERD (gastroesophageal reflux disease)   . Hypertension   . Insomnia, unspecified   . Loss of weight   . Osteoarthrosis, unspecified whether generalized or localized, unspecified site   . Other protein-calorie malnutrition   . Pain in thoracic spine   . Personal history of fall   . Unspecified constipation   . Unspecified hereditary and idiopathic peripheral neuropathy   . Unspecified hypothyroidism   . Unspecified vitamin D deficiency   . Urinary frequency    Past Surgical History:  Procedure Laterality Date  . TEE WITHOUT  CARDIOVERSION N/A 11/13/2016   Procedure: TRANSESOPHAGEAL ECHOCARDIOGRAM (TEE);  Surgeon: Jerline Pain, MD;  Location: [facility] ENDOSCOPY;  Service: Cardiovascular;  Laterality: N/A;  . WRIST SURGERY Right 2013   Social History:   reports that she has never smoked. She has never used smokeless tobacco. She reports that she does not drink alcohol or use drugs.  Family History  Problem Relation Age of Onset  . Cancer Mother   . CVA Father   . Cancer Brother   . Alcohol abuse Sister     Medications: Patient's Medications  New Prescriptions   No medications on file  Previous Medications   ACETAMINOPHEN (TYLENOL) 325 MG TABLET    Take 2 tablets (650 mg total) by mouth every 6 (six) hours as needed.   AMLODIPINE (NORVASC) 5 MG TABLET    Take 1 tablet (5 mg total) by mouth daily.   BUPROPION (WELLBUTRIN XL) 150 MG 24 HR TABLET    TAKE 1 TABLET(150 MG) BY MOUTH DAILY   DOXYCYCLINE (VIBRA-TABS) 100 MG TABLET    Take 1 tablet (100 mg total) by mouth 2 (two) times daily.   LEVOTHYROXINE (SYNTHROID) 88 MCG TABLET    TAKE ONE TABLET  ONCE DAILY 30 MINUTES BEFORE BREAKFAST FOR THYROID   MEMANTINE HCL-DONEPEZIL HCL (NAMZARIC) 28-10 MG CP24    Take 1 capsule by mouth daily.   VENLAFAXINE XR (  EFFEXOR-XR) 75 MG 24 HR CAPSULE    TAKE 1 CAPSULE EVERY DAY WITH BREAKFAST   VITAMIN D, ERGOCALCIFEROL, (DRISDOL) 1.25 MG (50000 UT) CAPS CAPSULE    Take one capsule by mouth every 14 days  Modified Medications   No medications on file  Discontinued Medications   No medications on file    Physical Exam:  Vitals:   10/18/18 1602  BP: 138/84  Pulse: 84  Temp: (!) 97.5 F (36.4 C)  TempSrc: Temporal  SpO2: 96%  Weight: 131 lb (59.4 kg)  Height: 5\' 4"  (1.626 m)   Body mass index is 22.49 kg/m. Wt Readings from Last 3 Encounters:  10/18/18 131 lb (59.4 kg)  10/05/18 131 lb (59.4 kg)  03/26/18 134 lb 3.2 oz (60.9 kg)    Physical Exam Constitutional:      Appearance: Normal appearance. She is normal  weight.  HENT:     Head: Normocephalic and atraumatic.  Eyes:     Extraocular Movements: Extraocular movements intact.     Pupils: Pupils are equal, round, and reactive to light.  Cardiovascular:     Rate and Rhythm: Normal rate and regular rhythm.  Pulmonary:     Effort: Pulmonary effort is normal.     Breath sounds: Normal breath sounds.  Abdominal:     General: Bowel sounds are normal.     Palpations: Abdomen is soft.     Tenderness: There is no abdominal tenderness.     Comments: Rounded abdomen, soft, nontender  Musculoskeletal:        General: Swelling present.     Right lower leg: Edema (trace) present.     Left lower leg: Edema (trace) present.  Skin:    General: Skin is warm and dry.     Findings: Bruising (noted to bilateral shins in different stages of healing) and erythema present.          Comments: Abrasion noted to right anterior lower leg with scab, erythema noted with warmth extended to mid lower leg. No drainage noted. Area marked (pt refuses to cover)  Large skin tear with skin flap under the skin to left anterior wrist surrounding tissue red, warm and swollen. Without drainage.   Neurological:     Mental Status: She is alert. She is disoriented.     Motor: Weakness present.     Gait: Gait abnormal.  Psychiatric:        Cognition and Memory: Cognition is impaired. Memory is impaired.     Labs reviewed: Basic Metabolic Panel: Recent Labs    05/19/18 1657 08/25/18 1449 10/05/18 1619  NA 140 141 141  K 3.9 3.0* 3.5  CL 105 104 102  CO2 24 26 29   GLUCOSE 92 95 95  BUN 19 13 18   CREATININE 0.88 0.77 1.03*  CALCIUM 9.0 9.4 9.5  TSH  --   --  0.78   Liver Function Tests: Recent Labs    03/26/18 1510 05/19/18 1657 08/25/18 1449  AST 19 27 27   ALT 17 21 22   ALKPHOS  --  98 95  BILITOT 0.5 0.8 0.8  PROT 7.0 7.7 7.8  ALBUMIN  --  4.4 4.6   No results for input(s): LIPASE, AMYLASE in the last 8760 hours. No results for input(s): AMMONIA in  the last 8760 hours. CBC: Recent Labs    05/19/18 1657 08/25/18 1449 10/05/18 1619  WBC 8.6 7.0 6.4  NEUTROABS 5.8 4.8 3,962  HGB 14.6 16.0* 14.1  HCT 44.1 46.1* 41.6  MCV 105.3* 102.9* 101.5*  PLT 226 227 262   Lipid Panel: No results for input(s): CHOL, HDL, LDLCALC, TRIG, CHOLHDL, LDLDIRECT in the last 8760 hours. TSH: Recent Labs    10/05/18 1619  TSH 0.78   A1C: Lab Results  Component Value Date   HGBA1C 5.2 11/11/2016     Assessment/Plan 1. Infected skin tear -skin tears to lower leg and new skin tear to left anterior forearm. -left arm skin tear cleanse thoroughly and mepliex applied followed with nonstick adhesive. curlex dressing applied. Dressing care discussed with family and importance of hand hygiene.  - doxycycline (VIBRA-TABS) 100 MG tablet; Take 1 tablet (100 mg total) by mouth 2 (two) times daily.  Dispense: 14 tablet; Refill: 0  2. Cellulitis of left lower extremity -continues on doxycycline, will have her take a total of 2 weeks, marked area on leg and to seek immediate attention if redness worsens or fever occurs  Next appt: 3 days in office for recheck  Tanaiya Kolarik K. [place], [facility] [phone]

## 2018-10-18 NOTE — Patient Instructions (Addendum)
Do a "check-in" and wash hands with sanitizer throughout the day.   If she removes dressing to rinse with saline Clean with iodine  To apply mepitel with safetac technology  Apply nonstick over mepitel and wrap with curlex

## 2018-10-19 ENCOUNTER — Other Ambulatory Visit: Payer: Self-pay | Admitting: Nurse Practitioner

## 2018-10-19 DIAGNOSIS — L089 Local infection of the skin and subcutaneous tissue, unspecified: Secondary | ICD-10-CM

## 2018-10-19 DIAGNOSIS — T148XXA Other injury of unspecified body region, initial encounter: Secondary | ICD-10-CM

## 2018-10-20 ENCOUNTER — Encounter: Payer: Self-pay | Admitting: Nurse Practitioner

## 2018-10-20 ENCOUNTER — Ambulatory Visit (INDEPENDENT_AMBULATORY_CARE_PROVIDER_SITE_OTHER): Payer: Medicare HMO | Admitting: Nurse Practitioner

## 2018-10-20 ENCOUNTER — Other Ambulatory Visit: Payer: Self-pay

## 2018-10-20 VITALS — BP 142/80 | HR 87 | Temp 97.5°F | Ht 64.0 in | Wt 131.0 lb

## 2018-10-20 DIAGNOSIS — L03116 Cellulitis of left lower limb: Secondary | ICD-10-CM

## 2018-10-20 DIAGNOSIS — L089 Local infection of the skin and subcutaneous tissue, unspecified: Secondary | ICD-10-CM | POA: Diagnosis not present

## 2018-10-20 DIAGNOSIS — T148XXA Other injury of unspecified body region, initial encounter: Secondary | ICD-10-CM | POA: Diagnosis not present

## 2018-10-20 LAB — CBC WITH DIFFERENTIAL/PLATELET
Absolute Monocytes: 654 cells/uL (ref 200–950)
Basophils Absolute: 52 cells/uL (ref 0–200)
Basophils Relative: 0.6 %
Eosinophils Absolute: 146 cells/uL (ref 15–500)
Eosinophils Relative: 1.7 %
HCT: 41.1 % (ref 35.0–45.0)
Hemoglobin: 14 g/dL (ref 11.7–15.5)
Lymphs Abs: 1901 cells/uL (ref 850–3900)
MCH: 34.4 pg — ABNORMAL HIGH (ref 27.0–33.0)
MCHC: 34.1 g/dL (ref 32.0–36.0)
MCV: 101 fL — ABNORMAL HIGH (ref 80.0–100.0)
MPV: 9.6 fL (ref 7.5–12.5)
Monocytes Relative: 7.6 %
Neutro Abs: 5848 cells/uL (ref 1500–7800)
Neutrophils Relative %: 68 %
Platelets: 270 10*3/uL (ref 140–400)
RBC: 4.07 10*6/uL (ref 3.80–5.10)
RDW: 11.7 % (ref 11.0–15.0)
Total Lymphocyte: 22.1 %
WBC: 8.6 10*3/uL (ref 3.8–10.8)

## 2018-10-20 MED ORDER — CEFTRIAXONE SODIUM 1 G IJ SOLR
1.0000 g | Freq: Once | INTRAMUSCULAR | Status: AC
  Administered 2018-10-20: 1 g via INTRAMUSCULAR

## 2018-10-20 NOTE — Progress Notes (Signed)
Careteam: Patient Care Team: [name], NP as PCP - General (Geriatric Medicine) Berneta Sages, NP as Nurse Practitioner [facility] and Palliative Medicine)  Advanced Directive information    No Known Allergies  Chief Complaint  Patient presents with  . Follow-up    Recheck skin tear and recheck legs      HPI: Patient is a 85 y.o. female seen in the office today for wound check. Daughter reports she is acting very strange. "like 2 different people". No sleeping, pacing around trying to get out the door.  Arguing with her daughter trying to leave.  Taking antibiotic as prescribes; No GI upset.   Redness on leg no better. No fevers or increase in pain noted. Continues with warmth to lower leg.   She took the dressing off her left forearm later that evening. Family was able to put another dressing on.  Still having an issue with hand hygiene      Review of Systems:  Review of Systems  Unable to perform ROS: Dementia   Past Medical History:  Diagnosis Date  . Abnormality of gait   . Alzheimer's disease ([place])   . Calculus of kidney   . Closed fracture of seventh cervical vertebra without mention of spinal cord injury   . Depressive disorder, not elsewhere classified   . Edema   . Edema   . External hemorrhoids without mention of complication   . Gastric ulcer, unspecified as acute or chronic, without mention of hemorrhage, perforation, or obstruction   . GERD (gastroesophageal reflux disease)   . Hypertension   . Insomnia, unspecified   . Loss of weight   . Osteoarthrosis, unspecified whether generalized or localized, unspecified site   . Other protein-calorie malnutrition   . Pain in thoracic spine   . Personal history of fall   . Unspecified constipation   . Unspecified hereditary and idiopathic peripheral neuropathy   . Unspecified hypothyroidism   . Unspecified vitamin D deficiency   . Urinary frequency    Past Surgical History:  Procedure  Laterality Date  . TEE WITHOUT CARDIOVERSION N/A 11/13/2016   Procedure: TRANSESOPHAGEAL ECHOCARDIOGRAM (TEE);  Surgeon: Jerline Pain, MD;  Location: [facility] ENDOSCOPY;  Service: Cardiovascular;  Laterality: N/A;  . WRIST SURGERY Right 2013   Social History:   reports that she has never smoked. She has never used smokeless tobacco. She reports that she does not drink alcohol or use drugs.  Family History  Problem Relation Age of Onset  . Cancer Mother   . CVA Father   . Cancer Brother   . Alcohol abuse Sister     Medications: Patient's Medications  New Prescriptions   No medications on file  Previous Medications   ACETAMINOPHEN (TYLENOL) 325 MG TABLET    Take 2 tablets (650 mg total) by mouth every 6 (six) hours as needed.   AMLODIPINE (NORVASC) 5 MG TABLET    Take 1 tablet (5 mg total) by mouth daily.   BUPROPION (WELLBUTRIN XL) 150 MG 24 HR TABLET    TAKE 1 TABLET(150 MG) BY MOUTH DAILY   DOXYCYCLINE (VIBRA-TABS) 100 MG TABLET    Take 1 tablet (100 mg total) by mouth 2 (two) times daily.   LEVOTHYROXINE (SYNTHROID) 88 MCG TABLET    TAKE ONE TABLET  ONCE DAILY 30 MINUTES BEFORE BREAKFAST FOR THYROID   MEMANTINE HCL-DONEPEZIL HCL (NAMZARIC) 28-10 MG CP24    Take 1 capsule by mouth daily.   VENLAFAXINE XR (EFFEXOR-XR) 75  MG 24 HR CAPSULE    TAKE 1 CAPSULE EVERY DAY WITH BREAKFAST   VITAMIN D, ERGOCALCIFEROL, (DRISDOL) 1.25 MG (50000 UT) CAPS CAPSULE    Take one capsule by mouth every 14 days  Modified Medications   No medications on file  Discontinued Medications   No medications on file    Physical Exam:  Vitals:   10/20/18 1538  BP: (!) 142/80  Pulse: 87  Temp: (!) 97.5 F (36.4 C)  TempSrc: Temporal  SpO2: 96%  Weight: 131 lb (59.4 kg)  Height: 5\' 4"  (1.626 m)   Body mass index is 22.49 kg/m. Wt Readings from Last 3 Encounters:  10/20/18 131 lb (59.4 kg)  10/18/18 131 lb (59.4 kg)  10/05/18 131 lb (59.4 kg)    Physical Exam Constitutional:      Appearance:  Normal appearance. She is normal weight.  HENT:     Head: Normocephalic and atraumatic.  Eyes:     Extraocular Movements: Extraocular movements intact.     Pupils: Pupils are equal, round, and reactive to light.  Cardiovascular:     Rate and Rhythm: Normal rate and regular rhythm.  Pulmonary:     Effort: Pulmonary effort is normal.     Breath sounds: Normal breath sounds.  Abdominal:     General: Bowel sounds are normal.     Palpations: Abdomen is soft.     Tenderness: There is no abdominal tenderness.     Comments: Rounded abdomen, soft, nontender  Musculoskeletal:        General: Swelling present.     Right lower leg: Edema (trace) present.     Left lower leg: Edema (trace) present.  Skin:    General: Skin is warm and dry.     Findings: Bruising (noted to bilateral shins in different stages of healing) and erythema present.          Comments: Abrasion noted to right anterior lower leg with scab, erythema noted with warmth extended to mid lower leg. No drainage noted. Area marked and redness slightly over marked area. (pt refuses to cover)  Large skin tear with skin flap under the skin to left anterior wrist surrounding tissue has improved. No longer red, warm or swollen. Without drainage. Wound base 75% red and 25% yellow.   Neurological:     Mental Status: She is alert. She is disoriented.     Motor: Weakness present.     Gait: Gait abnormal.  Psychiatric:        Cognition and Memory: Cognition is impaired. Memory is impaired.     Labs reviewed: Basic Metabolic Panel: Recent Labs    05/19/18 1657 08/25/18 1449 10/05/18 1619  NA 140 141 141  K 3.9 3.0* 3.5  CL 105 104 102  CO2 24 26 29   GLUCOSE 92 95 95  BUN 19 13 18   CREATININE 0.88 0.77 1.03*  CALCIUM 9.0 9.4 9.5  TSH  --   --  0.78   Liver Function Tests: Recent Labs    03/26/18 1510 05/19/18 1657 08/25/18 1449  AST 19 27 27   ALT 17 21 22   ALKPHOS  --  98 95  BILITOT 0.5 0.8 0.8  PROT 7.0 7.7 7.8   ALBUMIN  --  4.4 4.6   No results for input(s): LIPASE, AMYLASE in the last 8760 hours. No results for input(s): AMMONIA in the last 8760 hours. CBC: Recent Labs    05/19/18 1657 08/25/18 1449 10/05/18 1619  WBC 8.6 7.0 6.4  NEUTROABS 5.8 4.8 3,962  HGB 14.6 16.0* 14.1  HCT 44.1 46.1* 41.6  MCV 105.3* 102.9* 101.5*  PLT 226 227 262   Lipid Panel: No results for input(s): CHOL, HDL, LDLCALC, TRIG, CHOLHDL, LDLDIRECT in the last 8760 hours. TSH: Recent Labs    10/05/18 1619  TSH 0.78   A1C: Lab Results  Component Value Date   HGBA1C 5.2 11/11/2016     Assessment/Plan 1. Cellulitis of left lower extremity Ongoing, redness slightly worse. Pt with some increase in confusion and restlessness.  - CBC with Differential/Platelet to evaluate WBC - cefTRIAXone (ROCEPHIN) injection 1 g given today in office -to continue doxycycline 100 mg by mouth twice daily for total of 2 weeks  2. Infected skin tear Surrounding tissue with improvement since Monday. Did not leave dressing on. Xeroform dressing applied and to keep on skin tear until follow up, if she takes off to re-dress and directions given. - CBC with Differential/Platelet - cefTRIAXone (ROCEPHIN) injection 1 g  Next appt: 2 days for recheck wound and infection.  Carlos American. [place], [facility] [phone]

## 2018-10-20 NOTE — Patient Instructions (Signed)
To use xeroform to forearm, change every 3 days or sooner if needed.   To follow up on Friday for wound check and cellulitis

## 2018-10-21 ENCOUNTER — Other Ambulatory Visit: Payer: Self-pay

## 2018-10-21 DIAGNOSIS — E538 Deficiency of other specified B group vitamins: Secondary | ICD-10-CM

## 2018-10-22 ENCOUNTER — Ambulatory Visit (INDEPENDENT_AMBULATORY_CARE_PROVIDER_SITE_OTHER): Payer: Medicare HMO | Admitting: Family

## 2018-10-22 ENCOUNTER — Encounter: Payer: Self-pay | Admitting: Family

## 2018-10-22 ENCOUNTER — Other Ambulatory Visit: Payer: Self-pay

## 2018-10-22 VITALS — BP 118/64 | HR 81 | Temp 97.1°F | Resp 18 | Ht 64.0 in | Wt 130.2 lb

## 2018-10-22 DIAGNOSIS — S61512D Laceration without foreign body of left wrist, subsequent encounter: Secondary | ICD-10-CM | POA: Diagnosis not present

## 2018-10-22 DIAGNOSIS — L03116 Cellulitis of left lower limb: Secondary | ICD-10-CM | POA: Diagnosis not present

## 2018-10-22 DIAGNOSIS — T148XXA Other injury of unspecified body region, initial encounter: Secondary | ICD-10-CM | POA: Diagnosis not present

## 2018-10-22 NOTE — Progress Notes (Signed)
Provider: Krissia Schreier FNP-C  [name], NP  Patient Care Team: [name], NP as PCP - General (Geriatric Medicine) Berneta Sages, NP as Nurse Practitioner [facility] and Palliative Medicine)  Extended Emergency Contact Information Primary Emergency Contact: [name],[name] Address: [address]          [place], [place] [address] [place] Phone: [phone] Relation: Daughter Secondary Emergency Contact: Bottomley,Marsha Address: [address]          [place], [place] [address] [place] Phone: [phone] Relation: Daughter  Code Status:  DNR Goals of care: Advanced Directive information Advanced Directives 10/15/2018  Does Patient Have a Medical Advance Directive? Yes  Type of Advance Directive Out of facility DNR (pink MOST or yellow form);Living will  Does patient want to make changes to medical advance directive? No - Guardian declined  Copy of [place] in Chart? -  Would patient like information on creating a medical advance directive? -  Pre-existing out of facility DNR order (yellow form or pink MOST form) Yellow form placed in chart (order not valid for inpatient use);Pink MOST form placed in chart (order not valid for inpatient use)     Chief Complaint  Patient presents with  . Follow-up    Recheck on dressing patient states she took bandage off yesterday from left wrist and legs do not look any better     HPI:  Pt is a 85 y.o. female seen today for an acute visit for follow up left wrist and left leg wound.she is here with her daughter.she states patient continues to take off dressing on the left wrist.Left leg wound and redness looks much better but states still warm to touch and swollen.Additional antibiotics ordered 10/20/2018 by Sherrie Mustache NP but daughter states will pickup antibiotics today.No fever or chills reported.Recent labs WBC 8.6,Hgb 14,MCV 101 (10/20/2018) vitamin B12 level and  folate added to current labs.   Past Medical History:  Diagnosis Date  . Abnormality of gait   . Alzheimer's disease ([place])   . Calculus of kidney   . Closed fracture of seventh cervical vertebra without mention of spinal cord injury   . Depressive disorder, not elsewhere classified   . Edema   . Edema   . External hemorrhoids without mention of complication   . Gastric ulcer, unspecified as acute or chronic, without mention of hemorrhage, perforation, or obstruction   . GERD (gastroesophageal reflux disease)   . Hypertension   . Insomnia, unspecified   . Loss of weight   . Osteoarthrosis, unspecified whether generalized or localized, unspecified site   . Other protein-calorie malnutrition   . Pain in thoracic spine   . Personal history of fall   . Unspecified constipation   . Unspecified hereditary and idiopathic peripheral neuropathy   . Unspecified hypothyroidism   . Unspecified vitamin D deficiency   . Urinary frequency    Past Surgical History:  Procedure Laterality Date  . TEE WITHOUT CARDIOVERSION N/A 11/13/2016   Procedure: TRANSESOPHAGEAL ECHOCARDIOGRAM (TEE);  Surgeon: Jerline Pain, MD;  Location: [facility] ENDOSCOPY;  Service: Cardiovascular;  Laterality: N/A;  . WRIST SURGERY Right 2013    No Known Allergies  Outpatient Encounter Medications as of 10/22/2018  Medication Sig  . acetaminophen (TYLENOL) 325 MG tablet Take 2 tablets (650 mg total) by mouth every 6 (six) hours as needed.  Marland Kitchen amLODipine (NORVASC) 5 MG tablet Take 1 tablet (5 mg total) by mouth daily.  Marland Kitchen  buPROPion (WELLBUTRIN XL) 150 MG 24 hr tablet TAKE 1 TABLET(150 MG) BY MOUTH DAILY  . doxycycline (VIBRA-TABS) 100 MG tablet Take 1 tablet (100 mg total) by mouth 2 (two) times daily.  Marland Kitchen levothyroxine (SYNTHROID) 88 MCG tablet TAKE ONE TABLET  ONCE DAILY 30 MINUTES BEFORE BREAKFAST FOR THYROID  . Memantine HCl-Donepezil HCl (NAMZARIC) 28-10 MG CP24 Take 1 capsule by mouth daily.  Marland Kitchen venlafaxine XR (EFFEXOR-XR)  75 MG 24 hr capsule TAKE 1 CAPSULE EVERY DAY WITH BREAKFAST  . Vitamin D, Ergocalciferol, (DRISDOL) 1.25 MG (50000 UT) CAPS capsule Take one capsule by mouth every 14 days   No facility-administered encounter medications on file as of 10/22/2018.     Review of Systems  Unable to perform ROS: Dementia (additional information provided by patient's daughter )  Constitutional: Negative for chills, fatigue and fever.  Respiratory: Negative for cough, chest tightness, shortness of breath and wheezing.   Cardiovascular: Negative for chest pain and palpitations.       Left leg swelling   Musculoskeletal: Positive for gait problem.  Skin: Positive for wound. Negative for color change, pallor and rash.       Wound on Left wrist and left leg   Neurological: Negative for dizziness, light-headedness and headaches.  Psychiatric/Behavioral: Positive for confusion. Negative for agitation and sleep disturbance. The patient is not nervous/anxious.    Immunization History  Administered Date(s) Administered  . Influenza, High Dose Seasonal PF 12/10/2016  . Influenza,inj,Quad PF,6+ Mos 11/27/2015  . PPD Test 01/22/2015  . Pneumococcal Conjugate-13 11/27/2015  . Pneumococcal Polysaccharide-23 03/06/2017  . Tdap 05/19/2018   Pertinent  Health Maintenance Due  Topic Date Due  . INFLUENZA VACCINE  09/18/2018  . DEXA SCAN  Completed  . PNA vac Low Risk Adult  Completed   Fall Risk  10/22/2018 10/18/2018 10/05/2018 03/26/2018 03/11/2018  Falls in the past year? 1 1 1 1 1   Number falls in past yr: 1 1 1 1  0  Comment - - - - -  Injury with Fall? 1 1 1 1  0  Comment - - Broken Finger - -  Risk Factor Category  - - - - -  Risk for fall due to : - - - - -     Vitals:   10/22/18 1532  BP: 118/64  Pulse: 81  Temp: (!) 97.1 F (36.2 C)  TempSrc: Temporal  SpO2: 96%  Weight: 130 lb 3.2 oz (59.1 kg)  Height: 5\' 4"  (1.626 m)   Body mass index is 22.35 kg/m. Physical Exam Vitals signs reviewed.   Constitutional:      General: She is not in acute distress.    Appearance: She is normal weight. She is not ill-appearing.  Eyes:     General: No scleral icterus.       Right eye: No discharge.        Left eye: No discharge.     Conjunctiva/sclera: Conjunctivae normal.     Pupils: Pupils are equal, round, and reactive to light.  Musculoskeletal: Normal range of motion.        General: Swelling present.     Comments: Left lower leg swelling,redness and warm to touch still same from previous visit.    Skin:    General: Skin is warm and dry.     Coloration: Skin is not pale.     Findings: No rash.          Comments: 1. Left anterior wrist skin tear wound bed red with  20 % yellow skin tissues.No drainage noted surrounding skin tissues without any redness.wound bed cleansed with saline,pat dry,triple antibiotics ointment applied to wound bed and covered with mepitel safetac,covered with foam dressing for protection and secured with kelix and coban. 2. Left anterior leg wound bed dark scab noted.cleanse with saline,pat dry,triple antibiotic ointment applied and covered with foam dressing.patient tolerated procedure well.    Neurological:     Mental Status: She is alert. Mental status is at baseline.     Cranial Nerves: No cranial nerve deficit.     Gait: Gait abnormal.  Psychiatric:        Mood and Affect: Mood normal.        Speech: Speech normal.        Behavior: Behavior is combative. Behavior is not agitated or aggressive. Behavior is cooperative.        Thought Content: Thought content normal.        Cognition and Memory: Memory is impaired.       Careteam: Patient Care Team: [name], NP as PCP - General (Geriatric Medicine) Berneta Sages, NP as Nurse Practitioner [facility] and Palliative Medicine)  Advanced Directive information    No Known Allergies  Chief Complaint  Patient presents with  . Follow-up    Recheck on dressing patient states she took bandage  off yesterday from left wrist and legs do not look any better      HPI: Patient is a 84 y.o. female seen in the office today for wound check. Daughter reports she is acting very strange. "like 2 different people". No sleeping, pacing around trying to get out the door.  Arguing with her daughter trying to leave.  Taking antibiotic as prescribes; No GI upset.   Redness on leg no better. No fevers or increase in pain noted. Continues with warmth to lower leg.   She took the dressing off her left forearm later that evening. Family was able to put another dressing on.  Still having an issue with hand hygiene      Review of Systems:  Review of Systems  Unable to perform ROS: Dementia   Past Medical History:  Diagnosis Date  . Abnormality of gait   . Alzheimer's disease ([place])   . Calculus of kidney   . Closed fracture of seventh cervical vertebra without mention of spinal cord injury   . Depressive disorder, not elsewhere classified   . Edema   . Edema   . External hemorrhoids without mention of complication   . Gastric ulcer, unspecified as acute or chronic, without mention of hemorrhage, perforation, or obstruction   . GERD (gastroesophageal reflux disease)   . Hypertension   . Insomnia, unspecified   . Loss of weight   . Osteoarthrosis, unspecified whether generalized or localized, unspecified site   . Other protein-calorie malnutrition   . Pain in thoracic spine   . Personal history of fall   . Unspecified constipation   . Unspecified hereditary and idiopathic peripheral neuropathy   . Unspecified hypothyroidism   . Unspecified vitamin D deficiency   . Urinary frequency    Past Surgical History:  Procedure Laterality Date  . TEE WITHOUT CARDIOVERSION N/A 11/13/2016   Procedure: TRANSESOPHAGEAL ECHOCARDIOGRAM (TEE);  Surgeon: Jerline Pain, MD;  Location: [facility] ENDOSCOPY;  Service: Cardiovascular;  Laterality: N/A;  . WRIST SURGERY Right 2013   Social History:   reports  that she has never smoked. She has never used smokeless tobacco. She reports that she  does not drink alcohol or use drugs.  Family History  Problem Relation Age of Onset  . Cancer Mother   . CVA Father   . Cancer Brother   . Alcohol abuse Sister     Medications: Patient's Medications  New Prescriptions   No medications on file  Previous Medications   ACETAMINOPHEN (TYLENOL) 325 MG TABLET    Take 2 tablets (650 mg total) by mouth every 6 (six) hours as needed.   AMLODIPINE (NORVASC) 5 MG TABLET    Take 1 tablet (5 mg total) by mouth daily.   BUPROPION (WELLBUTRIN XL) 150 MG 24 HR TABLET    TAKE 1 TABLET(150 MG) BY MOUTH DAILY   DOXYCYCLINE (VIBRA-TABS) 100 MG TABLET    Take 1 tablet (100 mg total) by mouth 2 (two) times daily.   LEVOTHYROXINE (SYNTHROID) 88 MCG TABLET    TAKE ONE TABLET  ONCE DAILY 30 MINUTES BEFORE BREAKFAST FOR THYROID   MEMANTINE HCL-DONEPEZIL HCL (NAMZARIC) 28-10 MG CP24    Take 1 capsule by mouth daily.   VENLAFAXINE XR (EFFEXOR-XR) 75 MG 24 HR CAPSULE    TAKE 1 CAPSULE EVERY DAY WITH BREAKFAST   VITAMIN D, ERGOCALCIFEROL, (DRISDOL) 1.25 MG (50000 UT) CAPS CAPSULE    Take one capsule by mouth every 14 days  Modified Medications   No medications on file  Discontinued Medications   No medications on file    Physical Exam:  Vitals:   10/22/18 1532  BP: 118/64  Pulse: 81  Resp: 18  Temp: (!) 97.1 F (36.2 C)  TempSrc: Temporal  SpO2: 96%  Weight: 130 lb 3.2 oz (59.1 kg)  Height: 5\' 4"  (1.626 m)   Body mass index is 22.35 kg/m. Wt Readings from Last 3 Encounters:  10/22/18 130 lb 3.2 oz (59.1 kg)  10/20/18 131 lb (59.4 kg)  10/18/18 131 lb (59.4 kg)    Physical Exam Constitutional:      Appearance: Normal appearance. She is normal weight.  HENT:     Head: Normocephalic and atraumatic.  Eyes:     Extraocular Movements: Extraocular movements intact.     Pupils: Pupils are equal, round, and reactive to light.  Cardiovascular:     Rate and  Rhythm: Normal rate and regular rhythm.  Pulmonary:     Effort: Pulmonary effort is normal.     Breath sounds: Normal breath sounds.  Abdominal:     General: Bowel sounds are normal.     Palpations: Abdomen is soft.     Tenderness: There is no abdominal tenderness.     Comments: Rounded abdomen, soft, nontender  Musculoskeletal:        General: Swelling present.     Right lower leg: Edema (trace) present.     Left lower leg: Edema (trace) present.  Skin:    General: Skin is warm and dry.     Findings: Bruising (noted to bilateral shins in different stages of healing) and erythema present.          Comments: Abrasion noted to right anterior lower leg with scab, erythema noted with warmth extended to mid lower leg. No drainage noted. Area marked and redness slightly over marked area. (pt refuses to cover)  Large skin tear with skin flap under the skin to left anterior wrist surrounding tissue has improved. No longer red, warm or swollen. Without drainage. Wound base 75% red and 25% yellow.   Neurological:     Mental Status: She is alert. She is disoriented.  Motor: Weakness present.     Gait: Gait abnormal.  Psychiatric:        Cognition and Memory: Cognition is impaired. Memory is impaired.     Labs reviewed: Basic Metabolic Panel: Recent Labs    05/19/18 1657 08/25/18 1449 10/05/18 1619  NA 140 141 141  K 3.9 3.0* 3.5  CL 105 104 102  CO2 24 26 29   GLUCOSE 92 95 95  BUN 19 13 18   CREATININE 0.88 0.77 1.03*  CALCIUM 9.0 9.4 9.5  TSH  --   --  0.78   Liver Function Tests: Recent Labs    03/26/18 1510 05/19/18 1657 08/25/18 1449  AST 19 27 27   ALT 17 21 22   ALKPHOS  --  98 95  BILITOT 0.5 0.8 0.8  PROT 7.0 7.7 7.8  ALBUMIN  --  4.4 4.6   No results for input(s): LIPASE, AMYLASE in the last 8760 hours. No results for input(s): AMMONIA in the last 8760 hours. CBC: Recent Labs    08/25/18 1449 10/05/18 1619 10/20/18 1614  WBC 7.0 6.4 8.6  NEUTROABS  4.8 3,962 5,848  HGB 16.0* 14.1 14.0  HCT 46.1* 41.6 41.1  MCV 102.9* 101.5* 101.0*  PLT 227 262 270   Lipid Panel: No results for input(s): CHOL, HDL, LDLCALC, TRIG, CHOLHDL, LDLDIRECT in the last 8760 hours. TSH: Recent Labs    10/05/18 1619  TSH 0.78   A1C: Lab Results  Component Value Date   HGBA1C 5.2 11/11/2016     Assessment/Plan 1. Cellulitis of left lower extremity Ongoing, redness slightly worse. Pt with some increase in confusion and restlessness.  - CBC with Differential/Platelet to evaluate WBC - cefTRIAXone (ROCEPHIN) injection 1 g given today in office -to continue doxycycline 100 mg by mouth twice daily for total of 2 weeks  2. Infected skin tear Surrounding tissue with improvement since Monday. Did not leave dressing on. Xeroform dressing applied and to keep on skin tear until follow up, if she takes off to re-dress and directions given. - CBC with Differential/Platelet - cefTRIAXone (ROCEPHIN) injection 1 g  Next appt: 2 days for recheck wound and infection.  Carlos American. Harle Battiest  [facility] [phone]   Labs reviewed: Recent Labs    05/19/18 1657 08/25/18 1449 10/05/18 1619  NA 140 141 141  K 3.9 3.0* 3.5  CL 105 104 102  CO2 24 26 29   GLUCOSE 92 95 95  BUN 19 13 18   CREATININE 0.88 0.77 1.03*  CALCIUM 9.0 9.4 9.5   Recent Labs    03/26/18 1510 05/19/18 1657 08/25/18 1449  AST 19 27 27   ALT 17 21 22   ALKPHOS  --  98 95  BILITOT 0.5 0.8 0.8  PROT 7.0 7.7 7.8  ALBUMIN  --  4.4 4.6   Recent Labs    08/25/18 1449 10/05/18 1619 10/20/18 1614  WBC 7.0 6.4 8.6  NEUTROABS 4.8 3,962 5,848  HGB 16.0* 14.1 14.0  HCT 46.1* 41.6 41.1  MCV 102.9* 101.5* 101.0*  PLT 227 262 270   Lab Results  Component Value Date   TSH 0.78 10/05/2018   Lab Results  Component Value Date   HGBA1C 5.2 11/11/2016   Lab Results  Component Value Date   CHOL 156 11/11/2016   HDL 61 11/11/2016   LDLCALC 87  11/11/2016   TRIG 42 11/11/2016   CHOLHDL 2.6 11/11/2016    Significant Diagnostic Results in last 30 days:  No results  found.  Assessment/Plan 1. Cellulitis of left lower extremity Afebrile.Skin redness has improved per daughter but still swollen and warm to touch.Daughter to pick up recent ordered antibiotics.Notify provider if running any fever > 100.5   2. Abrasion Left anterior wound bed dark in color.continue antibiotics as above and monitor for signs of infections.  3. Tear of skin of left wrist, subsequent encounter Wound bed red with 20 % yellow skin tissue.wound cleanse as above and dressing applied.patient takes off dressing per daughter.continue with current dressing changes every 3 days.Follow up in 2 weeks to evaluate.Will send to wound care center if still no improvement.   Family/ staff Communication: Reviewed plan of care with patient and daughter.  Labs/tests ordered: None   Hughes Wyndham C Carnie Bruemmer, NP

## 2018-11-16 ENCOUNTER — Emergency Department (HOSPITAL_COMMUNITY): Payer: Medicare HMO

## 2018-11-16 ENCOUNTER — Emergency Department (HOSPITAL_COMMUNITY)
Admission: EM | Admit: 2018-11-16 | Discharge: 2018-11-16 | Disposition: A | Discharge status: Home / Self Care | Payer: Medicare HMO | Attending: Emergency Medicine | Admitting: Emergency Medicine

## 2018-11-16 ENCOUNTER — Other Ambulatory Visit: Payer: Self-pay

## 2018-11-16 DIAGNOSIS — M545 Low back pain, unspecified: Secondary | ICD-10-CM

## 2018-11-16 DIAGNOSIS — S32000S Wedge compression fracture of unspecified lumbar vertebra, sequela: Secondary | ICD-10-CM

## 2018-11-16 DIAGNOSIS — I959 Hypotension, unspecified: Secondary | ICD-10-CM | POA: Diagnosis not present

## 2018-11-16 DIAGNOSIS — R52 Pain, unspecified: Secondary | ICD-10-CM | POA: Diagnosis not present

## 2018-11-16 DIAGNOSIS — E039 Hypothyroidism, unspecified: Secondary | ICD-10-CM | POA: Diagnosis not present

## 2018-11-16 DIAGNOSIS — G309 Alzheimer's disease, unspecified: Secondary | ICD-10-CM | POA: Insufficient documentation

## 2018-11-16 DIAGNOSIS — I10 Essential (primary) hypertension: Secondary | ICD-10-CM | POA: Diagnosis not present

## 2018-11-16 DIAGNOSIS — R0902 Hypoxemia: Secondary | ICD-10-CM | POA: Diagnosis not present

## 2018-11-16 DIAGNOSIS — G8929 Other chronic pain: Secondary | ICD-10-CM

## 2018-11-16 DIAGNOSIS — S299XXA Unspecified injury of thorax, initial encounter: Secondary | ICD-10-CM | POA: Diagnosis not present

## 2018-11-16 DIAGNOSIS — S32010S Wedge compression fracture of first lumbar vertebra, sequela: Secondary | ICD-10-CM | POA: Diagnosis not present

## 2018-11-16 DIAGNOSIS — R531 Weakness: Secondary | ICD-10-CM | POA: Insufficient documentation

## 2018-11-16 DIAGNOSIS — W19XXXA Unspecified fall, initial encounter: Secondary | ICD-10-CM

## 2018-11-16 DIAGNOSIS — S199XXA Unspecified injury of neck, initial encounter: Secondary | ICD-10-CM | POA: Diagnosis not present

## 2018-11-16 DIAGNOSIS — S32010A Wedge compression fracture of first lumbar vertebra, initial encounter for closed fracture: Secondary | ICD-10-CM | POA: Diagnosis not present

## 2018-11-16 DIAGNOSIS — Z79899 Other long term (current) drug therapy: Secondary | ICD-10-CM | POA: Insufficient documentation

## 2018-11-16 DIAGNOSIS — R5383 Other fatigue: Secondary | ICD-10-CM | POA: Diagnosis not present

## 2018-11-16 DIAGNOSIS — M546 Pain in thoracic spine: Secondary | ICD-10-CM | POA: Diagnosis not present

## 2018-11-16 DIAGNOSIS — R410 Disorientation, unspecified: Secondary | ICD-10-CM | POA: Diagnosis not present

## 2018-11-16 DIAGNOSIS — S0990XA Unspecified injury of head, initial encounter: Secondary | ICD-10-CM | POA: Diagnosis not present

## 2018-11-16 DIAGNOSIS — E876 Hypokalemia: Secondary | ICD-10-CM

## 2018-11-16 LAB — URINALYSIS, ROUTINE W REFLEX MICROSCOPIC
Bilirubin Urine: NEGATIVE
Glucose, UA: NEGATIVE mg/dL
Ketones, ur: NEGATIVE mg/dL
Leukocytes,Ua: NEGATIVE
Nitrite: NEGATIVE
Protein, ur: NEGATIVE mg/dL
Specific Gravity, Urine: 1.008 (ref 1.005–1.030)
pH: 8 (ref 5.0–8.0)

## 2018-11-16 LAB — COMPREHENSIVE METABOLIC PANEL
ALT: 13 U/L (ref 0–44)
AST: 19 U/L (ref 15–41)
Albumin: 3.7 g/dL (ref 3.5–5.0)
Alkaline Phosphatase: 98 U/L (ref 38–126)
Anion gap: 13 (ref 5–15)
BUN: 14 mg/dL (ref 8–23)
CO2: 25 mmol/L (ref 22–32)
Calcium: 8.9 mg/dL (ref 8.9–10.3)
Chloride: 100 mmol/L (ref 98–111)
Creatinine, Ser: 1.19 mg/dL — ABNORMAL HIGH (ref 0.44–1.00)
GFR calc Af Amer: 48 mL/min — ABNORMAL LOW (ref >60)
GFR calc non Af Amer: 42 mL/min — ABNORMAL LOW (ref >60)
Glucose, Bld: 117 mg/dL — ABNORMAL HIGH (ref 70–99)
Potassium: 2.9 mmol/L — ABNORMAL LOW (ref 3.5–5.1)
Sodium: 138 mmol/L (ref 135–145)
Total Bilirubin: 0.6 mg/dL (ref 0.3–1.2)
Total Protein: 6.6 g/dL (ref 6.5–8.1)

## 2018-11-16 LAB — CBC WITH DIFFERENTIAL/PLATELET
Abs Immature Granulocytes: 0.04 10*3/uL (ref 0.00–0.07)
Basophils Absolute: 0.1 10*3/uL (ref 0.0–0.1)
Basophils Relative: 1 %
Eosinophils Absolute: 0.1 10*3/uL (ref 0.0–0.5)
Eosinophils Relative: 1 %
HCT: 41.7 % (ref 36.0–46.0)
Hemoglobin: 13.8 g/dL (ref 12.0–15.0)
Immature Granulocytes: 1 %
Lymphocytes Relative: 19 %
Lymphs Abs: 1.5 10*3/uL (ref 0.7–4.0)
MCH: 34.3 pg — ABNORMAL HIGH (ref 26.0–34.0)
MCHC: 33.1 g/dL (ref 30.0–36.0)
MCV: 103.7 fL — ABNORMAL HIGH (ref 80.0–100.0)
Monocytes Absolute: 0.5 10*3/uL (ref 0.1–1.0)
Monocytes Relative: 6 %
Neutro Abs: 5.8 10*3/uL (ref 1.7–7.7)
Neutrophils Relative %: 72 %
Platelets: 215 10*3/uL (ref 150–400)
RBC: 4.02 MIL/uL (ref 3.87–5.11)
RDW: 12.2 % (ref 11.5–15.5)
WBC: 8 10*3/uL (ref 4.0–10.5)
nRBC: 0 % (ref 0.0–0.2)

## 2018-11-16 LAB — BRAIN NATRIURETIC PEPTIDE: B Natriuretic Peptide: 74.8 pg/mL (ref 0.0–100.0)

## 2018-11-16 LAB — LIPASE, BLOOD: Lipase: 25 U/L (ref 11–51)

## 2018-11-16 MED ORDER — POTASSIUM CHLORIDE 10 MEQ/100ML IV SOLN
10.0000 meq | Freq: Once | INTRAVENOUS | Status: AC
  Administered 2018-11-16: 10 meq via INTRAVENOUS
  Filled 2018-11-16: qty 100

## 2018-11-16 MED ORDER — SODIUM CHLORIDE 0.9 % IV SOLN
INTRAVENOUS | Status: DC
  Administered 2018-11-16: 17:00:00 via INTRAVENOUS

## 2018-11-16 MED ORDER — SODIUM CHLORIDE 0.9 % IV BOLUS
500.0000 mL | Freq: Once | INTRAVENOUS | Status: AC
  Administered 2018-11-16: 500 mL via INTRAVENOUS

## 2018-11-16 MED ORDER — NALOXONE HCL 0.4 MG/ML IJ SOLN
0.4000 mg | Freq: Once | INTRAMUSCULAR | Status: AC
  Administered 2018-11-16: 0.4 mg via INTRAVENOUS
  Filled 2018-11-16: qty 1

## 2018-11-16 NOTE — ED Notes (Signed)
Patient verbalizes understanding of discharge instructions. Opportunity for questioning and answers were provided. Armband removed by staff, pt discharged from ED via wheelchair to home with family.  

## 2018-11-16 NOTE — ED Notes (Signed)
pts daughter called to pick up patient for discharge

## 2018-11-16 NOTE — ED Notes (Signed)
Patient daughter Thurston Pounds [phone]  Call when patient is being discharged

## 2018-11-16 NOTE — ED Provider Notes (Signed)
[facility] EMERGENCY DEPARTMENT Provider Note   CSN: [id number] Arrival date & time: 11/16/18  1526     History   Chief Complaint Chief Complaint  Patient presents with  . Fall  . Weakness    HPI [name] is a 85 y.o. female.     Patient brought in by EMS.  After a fall getting up from a rocking chair on the front porch.  Fell into the door and slid down.  Not sure whether she hit her head or not.  Patient been showing some weakness and lethargy for about 3 days.  Did have a discussion with patient's daughter.  Patient's not been not drinking fluids were well she does have a history of dementia.  Daughter states she does sleep often.  Unsure if there was loss of consciousness.  Daughter does state that she has a chronically complains of lower abdominal pain.  And also has more recently been complaining of back pain.  Patient has a history of frequent falls.  Upon arrival here patient very out of it.  Very lethargic.  Minimal verbal response.  And then essentially became almost unresponsive we were not sure if she was sleeping or not.  Patient not on any blood thinners.     Past Medical History:  Diagnosis Date  . Abnormality of gait   . Alzheimer's disease ([place])   . Calculus of kidney   . Closed fracture of seventh cervical vertebra without mention of spinal cord injury   . Depressive disorder, not elsewhere classified   . Edema   . Edema   . External hemorrhoids without mention of complication   . Gastric ulcer, unspecified as acute or chronic, without mention of hemorrhage, perforation, or obstruction   . GERD (gastroesophageal reflux disease)   . Hypertension   . Insomnia, unspecified   . Loss of weight   . Osteoarthrosis, unspecified whether generalized or localized, unspecified site   . Other protein-calorie malnutrition   . Pain in thoracic spine   . Personal history of fall   . Unspecified constipation   . Unspecified hereditary and  idiopathic peripheral neuropathy   . Unspecified hypothyroidism   . Unspecified vitamin D deficiency   . Urinary frequency     Patient Active Problem List   Diagnosis Date Noted  . BRBPR (bright red blood per rectum) 06/30/2017  . Colitis 06/30/2017  . Protein-calorie malnutrition, severe 11/13/2016  . Multiple falls   . Encephalopathy   . Agitation   . Slurred speech 11/10/2016  . Vitamin D deficiency 03/07/2015  . Hx of fracture of vertebral column 12/28/2014  . Constipation 07/27/2013  . Fracture of vertebra 07/27/2013  . Loss of weight 07/13/2013  . Peripheral vascular disease, unspecified ([place]) 07/13/2013  . Epigastric mass 07/13/2013  . Dementia with behavioral disturbance ([place])   . Major depression ([place])   . Insomnia, unspecified   . Fall 09/07/2012  . Fracture of right distal radius 09/07/2012  . Hypothyroidism 08/18/2006  . HYPERCHOLESTEROLEMIA 08/18/2006  . Essential hypertension 08/18/2006  . Restless legs 08/18/2006    Past Surgical History:  Procedure Laterality Date  . TEE WITHOUT CARDIOVERSION N/A 11/13/2016   Procedure: TRANSESOPHAGEAL ECHOCARDIOGRAM (TEE);  Surgeon: Jerline Pain, MD;  Location: [facility] ENDOSCOPY;  Service: Cardiovascular;  Laterality: N/A;  . WRIST SURGERY Right 2013     OB History   No obstetric history on file.      Home Medications    Prior to Admission  medications   Medication Sig Start Date End Date Taking? Authorizing Provider  acetaminophen (TYLENOL) 325 MG tablet Take 2 tablets (650 mg total) by mouth every 6 (six) hours as needed. 07/24/17   Jacqlyn Larsen, PA-C  amLODipine (NORVASC) 5 MG tablet Take 1 tablet (5 mg total) by mouth daily. 03/11/18   Ngetich, Dinah C, NP  buPROPion (WELLBUTRIN XL) 150 MG 24 hr tablet TAKE 1 TABLET(150 MG) BY MOUTH DAILY 08/11/18   [name], NP  doxycycline (VIBRA-TABS) 100 MG tablet Take 1 tablet (100 mg total) by mouth 2 (two) times daily. 10/18/18   [name], NP  levothyroxine  (SYNTHROID) 88 MCG tablet TAKE ONE TABLET  ONCE DAILY 30 MINUTES BEFORE BREAKFAST FOR THYROID 08/11/18   [name], NP  Memantine HCl-Donepezil HCl (NAMZARIC) 28-10 MG CP24 Take 1 capsule by mouth daily. 08/11/18   [name], NP  venlafaxine XR (EFFEXOR-XR) 75 MG 24 hr capsule TAKE 1 CAPSULE EVERY DAY WITH BREAKFAST 10/08/18   [name], NP  Vitamin D, Ergocalciferol, (DRISDOL) 1.25 MG (50000 UT) CAPS capsule Take one capsule by mouth every 14 days 10/06/18   [name], NP    Family History Family History  Problem Relation Age of Onset  . Cancer Mother   . CVA Father   . Cancer Brother   . Alcohol abuse Sister     Social History Social History   Tobacco Use  . Smoking status: Never Smoker  . Smokeless tobacco: Never Used  Substance Use Topics  . Alcohol use: No  . Drug use: No     Allergies   Patient has no known allergies.   Review of Systems Review of Systems  Unable to perform ROS: Mental status change     Physical Exam Updated Vital Signs BP (!) 156/76 (BP Location: Left Arm)   Pulse 63   Temp 98 F (36.7 C) (Oral)   Resp 16   SpO2 99%   Physical Exam Vitals signs and nursing note reviewed.  Constitutional:      General: She is not in acute distress.    Appearance: Normal appearance. She is well-developed.  HENT:     Head: Normocephalic and atraumatic.  Eyes:     Extraocular Movements: Extraocular movements intact.     Conjunctiva/sclera: Conjunctivae normal.     Pupils: Pupils are equal, round, and reactive to light.  Neck:     Musculoskeletal: Neck supple.  Cardiovascular:     Rate and Rhythm: Normal rate and regular rhythm.     Heart sounds: No murmur.  Pulmonary:     Effort: Pulmonary effort is normal. No respiratory distress.     Breath sounds: Normal breath sounds.  Abdominal:     General: Bowel sounds are normal. There is distension.     Palpations: Abdomen is soft.     Tenderness: There is abdominal  tenderness.     Comments: Mild tenderness to palpation to the abdomen.  Slight distention.  No guarding.  Musculoskeletal: Normal range of motion.        General: No swelling.  Skin:    General: Skin is warm and dry.  Neurological:     General: No focal deficit present.     Mental Status: She is alert. Mental status is at baseline.     Comments: Patient went on to wake up.  Moving all extremities.  Patient verbalizing fine.  Expressing wanting to go home.  Denied any complaints even  to her back.      ED Treatments / Results  Labs (all labs ordered are listed, but only abnormal results are displayed) Labs Reviewed - No data to display  EKG EKG Interpretation  Date/Time:  Tuesday November 16 2018 15:33:53 EDT Ventricular Rate:  64 PR Interval:    QRS Duration: 115 QT Interval:  499 QTC Calculation: 515 R Axis:   47 Text Interpretation:  Sinus rhythm Nonspecific intraventricular conduction delay Abnormal inferior Q waves Minimal ST depression, diffuse leads No significant change since last tracing Confirmed by Fredia Sorrow [phone]) on 11/16/2018 3:40:21 PM   Radiology No results found.  Procedures Procedures (including critical care time)  Medications Ordered in ED Medications - No data to display   Results for orders placed or performed during the hospital encounter of 11/16/18  Comprehensive metabolic panel  Result Value Ref Range   Sodium 138 135 - 145 mmol/L   Potassium 2.9 (L) 3.5 - 5.1 mmol/L   Chloride 100 98 - 111 mmol/L   CO2 25 22 - 32 mmol/L   Glucose, Bld 117 (H) 70 - 99 mg/dL   BUN 14 8 - 23 mg/dL   Creatinine, Ser 1.19 (H) 0.44 - 1.00 mg/dL   Calcium 8.9 8.9 - 10.3 mg/dL   Total Protein 6.6 6.5 - 8.1 g/dL   Albumin 3.7 3.5 - 5.0 g/dL   AST 19 15 - 41 U/L   ALT 13 0 - 44 U/L   Alkaline Phosphatase 98 38 - 126 U/L   Total Bilirubin 0.6 0.3 - 1.2 mg/dL   GFR calc non Af Amer 42 (L) >60 mL/min   GFR calc Af Amer 48 (L) >60 mL/min   Anion gap 13 5  - 15  Lipase, blood  Result Value Ref Range   Lipase 25 11 - 51 U/L  CBC with Differential/Platelet  Result Value Ref Range   WBC 8.0 4.0 - 10.5 K/uL   RBC 4.02 3.87 - 5.11 MIL/uL   Hemoglobin 13.8 12.0 - 15.0 g/dL   HCT 41.7 36.0 - 46.0 %   MCV 103.7 (H) 80.0 - 100.0 fL   MCH 34.3 (H) 26.0 - 34.0 pg   MCHC 33.1 30.0 - 36.0 g/dL   RDW 12.2 11.5 - 15.5 %   Platelets 215 150 - 400 K/uL   nRBC 0.0 0.0 - 0.2 %   Neutrophils Relative % 72 %   Neutro Abs 5.8 1.7 - 7.7 K/uL   Lymphocytes Relative 19 %   Lymphs Abs 1.5 0.7 - 4.0 K/uL   Monocytes Relative 6 %   Monocytes Absolute 0.5 0.1 - 1.0 K/uL   Eosinophils Relative 1 %   Eosinophils Absolute 0.1 0.0 - 0.5 K/uL   Basophils Relative 1 %   Basophils Absolute 0.1 0.0 - 0.1 K/uL   Immature Granulocytes 1 %   Abs Immature Granulocytes 0.04 0.00 - 0.07 K/uL  Brain natriuretic peptide  Result Value Ref Range   B Natriuretic Peptide 74.8 0.0 - 100.0 pg/mL  Urinalysis, Routine w reflex microscopic  Result Value Ref Range   Color, Urine YELLOW YELLOW   APPearance CLEAR CLEAR   Specific Gravity, Urine 1.008 1.005 - 1.030   pH 8.0 5.0 - 8.0   Glucose, UA NEGATIVE NEGATIVE mg/dL   Hgb urine dipstick SMALL (A) NEGATIVE   Bilirubin Urine NEGATIVE NEGATIVE   Ketones, ur NEGATIVE NEGATIVE mg/dL   Protein, ur NEGATIVE NEGATIVE mg/dL   Nitrite NEGATIVE NEGATIVE   Leukocytes,Ua NEGATIVE NEGATIVE  RBC / HPF 0-5 0 - 5 RBC/hpf   WBC, UA 0-5 0 - 5 WBC/hpf   Bacteria, UA RARE (A) NONE SEEN      Initial Impression / Assessment and Plan / ED Course  I have reviewed the triage vital signs and the nursing notes.  Pertinent labs & imaging results that were available during my care of the patient were reviewed by me and considered in my medical decision making (see chart for details).        Patient went on to wake up.  Was very verbal.  Wanted to go home denied any complaints.  Re-palpitation her abdomen without any tenderness.  But did  get her to stay to have x-rays of her lumbar and thoracic back.  CT head neck without any acute findings.  Labs without any significant abnormality other than mild hypokalemia at 2.9.  Patient is already received 10 mEq of potassium x2 IV.  Patient has some renal insufficiency.  So will not discharge home on oral potassium but instead will have her follow-up with her primary care doctor to have it rechecked.  Lumbar and thoracic x-rays showed some chronic abnormalities at L1-L2 is some component of compression.  Nothing acute.  This may explain her low back pain.  No evidence of urinary tract infection.  Final Clinical Impressions(s) / ED Diagnoses   Final diagnoses:  None    ED Discharge Orders    None       Fredia Sorrow, MD 11/16/18 2147

## 2018-11-16 NOTE — ED Triage Notes (Signed)
Pt from home via ems; c/o fall after standing up from rocking chair on front porch;  fell into door and slid down; c/o weakness, lethargy x 3 days and poor oral intake; family reports dementia like symptoms but no diagnoses; unsure of loc; possibly hit back of head; family denies pt on thinners; pt c/o lowerabd pain; per family pt normally soft spoken, can carry on conversation, but confused at baseline; answering few question with ems, lethargic on arrival   Marsha (Family) [phone]  142/78 HR 60 RR 14 93% RA CBG 165

## 2018-11-16 NOTE — Discharge Instructions (Addendum)
Work-up for the fall and the altered mental status without any acute findings.  At time of discharge patient is very alert.  X-rays of the back area showed old compression injury to the first and second lumbar vertebrae.  In addition her potassium was slightly low at 2.9.  Patient was given supplemental potassium here.  Will need follow-up with her primary care doctor to have that rechecked.  CT head and neck without any acute findings.  Chest x-ray without any significant abnormalities.  No evidence of urinary tract infection.

## 2019-01-27 ENCOUNTER — Other Ambulatory Visit: Payer: Medicare HMO | Admitting: Hospice

## 2019-01-27 ENCOUNTER — Other Ambulatory Visit: Payer: Self-pay

## 2019-01-27 DIAGNOSIS — Z515 Encounter for palliative care: Secondary | ICD-10-CM

## 2019-01-27 DIAGNOSIS — F0281 Dementia in other diseases classified elsewhere with behavioral disturbance: Secondary | ICD-10-CM

## 2019-01-27 DIAGNOSIS — F02818 Dementia in other diseases classified elsewhere, unspecified severity, with other behavioral disturbance: Secondary | ICD-10-CM

## 2019-01-27 NOTE — Progress Notes (Signed)
[facility] Consult Note Telephone: [phone]  Fax: [phone]  PATIENT NAME: [name] DOB: 09-01-1931 MRN: [id number]  PRIMARY CARE PROVIDER:   [name], NP  REFERRING PROVIDER:  [name], NP [place],  [place] [address]  RESPONSIBLE PARTY:  Primary Emergency Contact: [name],[name] Address: [address] [place], [place] [address] [place] Phone: [phone] Relation: Daughter Secondary Emergency Contact: Bottomley,Marsha Address: [address] [place], [place] [address] [place] Phone: [phone] Relation: Daughter  TELEHEALTH VISIT STATEMENT Due to the COVID-19 crisis, this visit was done via telephone from my office. It was initiated and consented to by this patient and/or family.  RECOMMENDATIONS/PLAN:   Advance Care Planning/Goals of Care: Telehealth visit consisted of building trust and discussions on Palliative Medicine as specialized medical care for people living with serious illness, aimed at facilitating better quality of life through symptoms relief, assisting with advance care plan and establishing goals of care. Patient is a DNR; DNR form at home and uploaded in [facility]. Goals of care include to maximize quality of life and symptom management. Symptom management: Memory loss r/t Dementia at baseline. FAST 6b. Patient ambulatory, able to communicate her needs, requires assistance with ADLs. Privately paid caregivers come to the house 5 days a week; daughter [name] lives at home with patient. She affirmed patient's appetite is fair, no acute concerns. Patient denied pain/discomfort. Endorsed feeling cold; thankful when blanket and extra sweater was given to her by Mickel Baas as advised. Education provided on encouraging patient to use her walker to help provide support and help prevent a fall.  Follow up: Palliative care will continue  to follow patient for goals of care clarification and symptom management. I spent 30  minutes providing this consultation, from 2pm to 2.30pm. More than 50% of the time in this consultation was spent on coordinating communication   HISTORY OF PRESENT ILLNESS:  [name] is a 85 y.o. year old female with multiple medical problems including Alzhiemer's dementia, frequent falls, PVD, h/o fractures, constipation. Palliative Care was asked to help address goals of care.   CODE STATUS: DNR  PPS: 40% HOSPICE ELIGIBILITY/DIAGNOSIS: TBD  PAST MEDICAL HISTORY:  Past Medical History:  Diagnosis Date  . Abnormality of gait   . Alzheimer's disease ([place])   . Calculus of kidney   . Closed fracture of seventh cervical vertebra without mention of spinal cord injury   . Depressive disorder, not elsewhere classified   . Edema   . Edema   . External hemorrhoids without mention of complication   . Gastric ulcer, unspecified as acute or chronic, without mention of hemorrhage, perforation, or obstruction   . GERD (gastroesophageal reflux disease)   . Hypertension   . Insomnia, unspecified   . Loss of weight   . Osteoarthrosis, unspecified whether generalized or localized, unspecified site   . Other protein-calorie malnutrition   . Pain in thoracic spine   . Personal history of fall   . Unspecified constipation   . Unspecified hereditary and idiopathic peripheral neuropathy   . Unspecified hypothyroidism   . Unspecified vitamin D deficiency   . Urinary frequency     SOCIAL HX:  Social History   Tobacco Use  . Smoking status: Never Smoker  . Smokeless tobacco: Never Used  Substance Use Topics  . Alcohol use: No    ALLERGIES: No Known Allergies   PERTINENT MEDICATIONS:  Outpatient Encounter Medications  as of 01/27/2019  Medication Sig  . acetaminophen (TYLENOL) 325 MG tablet Take 2 tablets (650 mg total) by mouth every 6 (six) hours as needed.  Marland Kitchen amLODipine (NORVASC) 5 MG tablet Take 1  tablet (5 mg total) by mouth daily.  Marland Kitchen buPROPion (WELLBUTRIN XL) 150 MG 24 hr tablet TAKE 1 TABLET(150 MG) BY MOUTH DAILY  . doxycycline (VIBRA-TABS) 100 MG tablet Take 1 tablet (100 mg total) by mouth 2 (two) times daily.  Marland Kitchen levothyroxine (SYNTHROID) 88 MCG tablet TAKE ONE TABLET  ONCE DAILY 30 MINUTES BEFORE BREAKFAST FOR THYROID  . Memantine HCl-Donepezil HCl (NAMZARIC) 28-10 MG CP24 Take 1 capsule by mouth daily.  Marland Kitchen venlafaxine XR (EFFEXOR-XR) 75 MG 24 hr capsule TAKE 1 CAPSULE EVERY DAY WITH BREAKFAST  . Vitamin D, Ergocalciferol, (DRISDOL) 1.25 MG (50000 UT) CAPS capsule Take one capsule by mouth every 14 days   No facility-administered encounter medications on file as of 01/27/2019.    Teodoro Spray, NP

## 2019-02-16 ENCOUNTER — Other Ambulatory Visit: Payer: Self-pay | Admitting: Nurse Practitioner

## 2019-02-16 DIAGNOSIS — F332 Major depressive disorder, recurrent severe without psychotic features: Secondary | ICD-10-CM

## 2019-02-16 NOTE — Telephone Encounter (Signed)
3 month supply given until patient makes a follow up appointment.

## 2019-02-27 ENCOUNTER — Other Ambulatory Visit: Payer: Self-pay | Admitting: Nurse Practitioner

## 2019-02-27 DIAGNOSIS — F332 Major depressive disorder, recurrent severe without psychotic features: Secondary | ICD-10-CM

## 2019-02-27 DIAGNOSIS — G301 Alzheimer's disease with late onset: Secondary | ICD-10-CM

## 2019-02-27 DIAGNOSIS — F0281 Dementia in other diseases classified elsewhere with behavioral disturbance: Secondary | ICD-10-CM

## 2019-02-27 DIAGNOSIS — F02818 Dementia in other diseases classified elsewhere, unspecified severity, with other behavioral disturbance: Secondary | ICD-10-CM

## 2019-03-03 ENCOUNTER — Other Ambulatory Visit: Payer: Self-pay

## 2019-03-03 ENCOUNTER — Other Ambulatory Visit: Payer: Medicare HMO | Admitting: Hospice

## 2019-03-03 DIAGNOSIS — Z515 Encounter for palliative care: Secondary | ICD-10-CM

## 2019-03-03 DIAGNOSIS — F0281 Dementia in other diseases classified elsewhere with behavioral disturbance: Secondary | ICD-10-CM

## 2019-03-03 DIAGNOSIS — F02818 Dementia in other diseases classified elsewhere, unspecified severity, with other behavioral disturbance: Secondary | ICD-10-CM

## 2019-03-03 NOTE — Progress Notes (Signed)
[facility] Consult Note Telephone: [phone]  Fax: [phone]  PATIENT NAME: [name] DOB: 1931-10-16 MRN: [id number]  PRIMARY CARE PROVIDER:   [name], NP  REFERRING PROVIDER:  [name], NP [place],  [place] [address]   RESPONSIBLE PARTY:  Primary Emergency Contact: [name],[name] Address: [address] [place], [place] [address] [place] Phone: [phone] Relation: Daughter Secondary Emergency Contact: Bottomley,Marsha Address: [address] [place],  [address] [place] Phone: [phone] Relation: Daughter  TELEHEALTH VISIT STATEMENT Due to the COVID-19 crisis, this visit was done via telephone from my office. It was initiated and consented to by this patient and/or family.  RECOMMENDATIONS/PLAN:   Advance Care Planning/Goals of Care: Telehealth visit consisted of building trust and follow up on palliative care. Patient remains a DNR. Goals of care include to maximize quality of life and symptom management. Symptom management:  No reported acute concerns. Memory loss r/t Dementia at baseline. FAST 6b. Patient ambulatory, able to feed self after set up able to communicate her needs, requires assistance with ADLs. Privately paid caregivers continue to come to the house 5 days a week; daughter [name] lives at home with patient. [name] reported patient had a fall 02/25/2019 with no injuries.  Education provided on encouraging patient to use her walker to help provide support and help prevent a fall.  Follow up: Palliative care will continue to follow patient for goals of care clarification and symptom management. I spent 30 minutes providing this consultation. More than 50% of the time in this consultation was spent on coordinating communication   HISTORY OF PRESENT ILLNESS:Gabrielle M Hawksis a 86 y.o.year oldfemalewith  multiple medical problems including Alzhiemer's dementia, frequent falls, PVD, h/o fractures, constipation. Palliative Care was asked to help address goals of care.    CODE STATUS: DNR  PPS: 40% HOSPICE ELIGIBILITY/DIAGNOSIS: TBD  PAST MEDICAL HISTORY:  Past Medical History:  Diagnosis Date  . Abnormality of gait   . Alzheimer's disease ([place])   . Calculus of kidney   . Closed fracture of seventh cervical vertebra without mention of spinal cord injury   . Depressive disorder, not elsewhere classified   . Edema   . Edema   . External hemorrhoids without mention of complication   . Gastric ulcer, unspecified as acute or chronic, without mention of hemorrhage, perforation, or obstruction   . GERD (gastroesophageal reflux disease)   . Hypertension   . Insomnia, unspecified   . Loss of weight   . Osteoarthrosis, unspecified whether generalized or localized, unspecified site   . Other protein-calorie malnutrition   . Pain in thoracic spine   . Personal history of fall   . Unspecified constipation   . Unspecified hereditary and idiopathic peripheral neuropathy   . Unspecified hypothyroidism   . Unspecified vitamin D deficiency   . Urinary frequency     SOCIAL HX:  Social History   Tobacco Use  . Smoking status: Never Smoker  . Smokeless tobacco: Never Used  Substance Use Topics  . Alcohol use: No    ALLERGIES: No Known Allergies   PERTINENT MEDICATIONS:  Outpatient Encounter Medications as of 03/03/2019  Medication Sig  . acetaminophen (TYLENOL) 325 MG tablet Take 2 tablets (650 mg total) by mouth every 6 (six) hours as needed.  Marland Kitchen amLODipine (NORVASC) 5 MG tablet Take 1 tablet (5 mg total) by mouth daily.  Marland Kitchen buPROPion (WELLBUTRIN XL) 150 MG  24 hr tablet TAKE 1 TABLET EVERY DAY  . doxycycline (VIBRA-TABS) 100 MG tablet Take 1 tablet (100 mg total) by mouth 2 (two) times daily.  Marland Kitchen levothyroxine (SYNTHROID) 88 MCG tablet TAKE ONE TABLET  ONCE DAILY 30 MINUTES BEFORE BREAKFAST  FOR THYROID  . Memantine HCl-Donepezil HCl (NAMZARIC) 28-10 MG CP24 Take 1 capsule by mouth daily. Please call and schedule follow up appointment for more refills  . venlafaxine XR (EFFEXOR-XR) 75 MG 24 hr capsule TAKE 1 CAPSULE EVERY DAY WITH BREAKFAST Must make appointment for more refills  . Vitamin D, Ergocalciferol, (DRISDOL) 1.25 MG (50000 UT) CAPS capsule Take one capsule by mouth every 14 days   No facility-administered encounter medications on file as of 03/03/2019.    Teodoro Spray, NP

## 2019-04-14 ENCOUNTER — Other Ambulatory Visit: Payer: Self-pay

## 2019-04-14 ENCOUNTER — Other Ambulatory Visit: Payer: Medicare HMO | Admitting: Hospice

## 2019-04-14 DIAGNOSIS — F0281 Dementia in other diseases classified elsewhere with behavioral disturbance: Secondary | ICD-10-CM

## 2019-04-14 DIAGNOSIS — Z515 Encounter for palliative care: Secondary | ICD-10-CM

## 2019-04-14 DIAGNOSIS — F02818 Dementia in other diseases classified elsewhere, unspecified severity, with other behavioral disturbance: Secondary | ICD-10-CM

## 2019-04-14 NOTE — Progress Notes (Signed)
[facility] Consult Note Telephone: [phone]  Fax: [phone]  PATIENT NAME: [name] DOB: 1931-05-25 MRN: [id number]  PRIMARY CARE PROVIDER:   [name], NP  REFERRING PROVIDER:  [name], NP [place],  [place] [address]   RESPONSIBLE PARTY:Primary Emergency Contact: [name],[name] Address: [address] [place], [place] [address] [place] Phone: [phone] Relation: Daughter Secondary Emergency Contact: Bottomley,Marsha Address: [address] [place], [place] [address] [place] Phone: [phone] Relation: Daughter  TELEHEALTH VISIT STATEMENT Due to the COVID-19 crisis, this visit was done via telephonefrom my office. It was initiated and consented to by this patient and/or family.  RECOMMENDATIONS/PLAN:  Advance Care Planning/Goals of Care: Telehealthvisit consisted of building trust and follow up on palliative care.Patient remains a DNR. Goals of care include to maximize quality of life and symptom management. Symptom management: Memory loss r/t Dementia at baseline. FAST 6b. No  acute concerns. Privately paid caregivers continue to come to the house 5 days a week; daughter [name] lives at home with patient. Patient ambulatory, able to feed self after set up, able to communicate her needs, requires assistance with ADLs. [name] affirmed patient is compliant with her medications and with using her rolling walker. Ongoing care encouraged Follow [id number] care will continue to follow patient for goals of care clarification and symptom management. I spent9minutes providing this consultation.More than 50% of the time in this consultation was spent on coordinating communication   HISTORY OF PRESENT ILLNESS:Jhordan M Hawksis a 86 y.o.year oldfemalewith multiple medical problems including Alzhiemer's dementia, frequent  falls, PVD, h/o fractures, constipation. Palliative Care was asked to help address goals of care.   CODE STATUS: DNR  PPS: 40% HOSPICE ELIGIBILITY/DIAGNOSIS: TBD  PAST MEDICAL HISTORY:  Past Medical History:  Diagnosis Date  . Abnormality of gait   . Alzheimer's disease ([place])   . Calculus of kidney   . Closed fracture of seventh cervical vertebra without mention of spinal cord injury   . Depressive disorder, not elsewhere classified   . Edema   . Edema   . External hemorrhoids without mention of complication   . Gastric ulcer, unspecified as acute or chronic, without mention of hemorrhage, perforation, or obstruction   . GERD (gastroesophageal reflux disease)   . Hypertension   . Insomnia, unspecified   . Loss of weight   . Osteoarthrosis, unspecified whether generalized or localized, unspecified site   . Other protein-calorie malnutrition   . Pain in thoracic spine   . Personal history of fall   . Unspecified constipation   . Unspecified hereditary and idiopathic peripheral neuropathy   . Unspecified hypothyroidism   . Unspecified vitamin D deficiency   . Urinary frequency     SOCIAL HX:  Social History   Tobacco Use  . Smoking status: Never Smoker  . Smokeless tobacco: Never Used  Substance Use Topics  . Alcohol use: No    ALLERGIES: No Known Allergies   PERTINENT MEDICATIONS:  Outpatient Encounter Medications as of 04/14/2019  Medication Sig  . acetaminophen (TYLENOL) 325 MG tablet Take 2 tablets (650 mg total) by mouth every 6 (six) hours as needed.  Marland Kitchen amLODipine (NORVASC) 5 MG tablet Take 1 tablet (5 mg total) by mouth daily.  Marland Kitchen buPROPion (WELLBUTRIN XL) 150 MG 24 hr tablet TAKE 1 TABLET EVERY DAY  . doxycycline (VIBRA-TABS) 100 MG tablet Take 1 tablet (100 mg total) by mouth 2 (two)  times daily.  Marland Kitchen levothyroxine (SYNTHROID) 88 MCG tablet TAKE ONE TABLET  ONCE DAILY 30 MINUTES BEFORE BREAKFAST FOR THYROID  . Memantine HCl-Donepezil HCl (NAMZARIC) 28-10 MG  CP24 Take 1 capsule by mouth daily. Please call and schedule follow up appointment for more refills  . venlafaxine XR (EFFEXOR-XR) 75 MG 24 hr capsule TAKE 1 CAPSULE EVERY DAY WITH BREAKFAST Must make appointment for more refills  . Vitamin D, Ergocalciferol, (DRISDOL) 1.25 MG (50000 UT) CAPS capsule Take one capsule by mouth every 14 days   No facility-administered encounter medications on file as of 04/14/2019.     Teodoro Spray, NP

## 2019-05-14 ENCOUNTER — Ambulatory Visit: Payer: Medicare HMO

## 2019-05-26 ENCOUNTER — Ambulatory Visit (INDEPENDENT_AMBULATORY_CARE_PROVIDER_SITE_OTHER): Payer: Medicare HMO | Admitting: Family

## 2019-05-26 ENCOUNTER — Other Ambulatory Visit: Payer: Self-pay

## 2019-05-26 ENCOUNTER — Encounter: Payer: Self-pay | Admitting: Family

## 2019-05-26 VITALS — BP 130/70 | HR 75 | Temp 97.7°F | Ht 64.0 in | Wt 125.0 lb

## 2019-05-26 DIAGNOSIS — W19XXXD Unspecified fall, subsequent encounter: Secondary | ICD-10-CM

## 2019-05-26 DIAGNOSIS — M25562 Pain in left knee: Secondary | ICD-10-CM | POA: Diagnosis not present

## 2019-05-26 DIAGNOSIS — Y92009 Unspecified place in unspecified non-institutional (private) residence as the place of occurrence of the external cause: Secondary | ICD-10-CM | POA: Diagnosis not present

## 2019-05-26 DIAGNOSIS — R399 Unspecified symptoms and signs involving the genitourinary system: Secondary | ICD-10-CM | POA: Diagnosis not present

## 2019-05-26 DIAGNOSIS — M79652 Pain in left thigh: Secondary | ICD-10-CM

## 2019-05-26 DIAGNOSIS — R0781 Pleurodynia: Secondary | ICD-10-CM | POA: Diagnosis not present

## 2019-05-26 LAB — POCT URINALYSIS DIPSTICK
Bilirubin, UA: NEGATIVE
Glucose, UA: NEGATIVE
Ketones, UA: NEGATIVE
Nitrite, UA: NEGATIVE
Protein, UA: NEGATIVE
Spec Grav, UA: 1.025 (ref 1.010–1.025)
Urobilinogen, UA: 0.2 E.U./dL
pH, UA: 6 (ref 5.0–8.0)

## 2019-05-26 NOTE — Patient Instructions (Signed)
Take Extra strength Tylenol 500 mg tablet one by mouth every 6 hours as needed for pain  - Please get right rib cage ,left knee/femur X-ray done at [facility]. Will call you with results.  - Urine send for culture and sensitivity will call you with results in 3 days. Notify provider if running any fever > 100.5  - Increase water intake  - take over the counter AZO tablets daily

## 2019-05-26 NOTE — Progress Notes (Addendum)
Provider: Malikhi Ogan FNP-C  [name], NP  Patient Care Team: [name], NP as PCP - General (Geriatric Medicine) Berneta Sages, NP as Nurse Practitioner [facility] Pall[facility])  Extended Emergency Contact Information Primary Emergency Contact: [name],[name] Address: [address]          [place], [place] [address] [place] Phone: [phone] Mobile Phone: [phone] Relation: Daughter Secondary Emergency Contact: Bottomley,Marsha Address: [address]          [place], [place] [address] [place] Phone: [phone] Relation: Daughter  Code Status:  DNR Goals of care: Advanced Directive information Advanced Directives 10/15/2018  Does Patient Have a Medical Advance Directive? Yes  Type of Advance Directive Out of facility DNR (pink MOST or yellow form);Living will  Does patient want to make changes to medical advance directive? No - Guardian declined  Copy of [place] in Chart? -  Would patient like information on creating a medical advance directive? -  Pre-existing out of facility DNR order (yellow form or pink MOST form) Yellow form placed in chart (order not valid for inpatient use);Pink MOST form placed in chart (order not valid for inpatient use)     Chief Complaint  Patient presents with  . Acute Visit    balance problem, urine odor, fall    HPI:  Pt is a 86 y.o. female seen today for an acute visit for evaluation of balance problem, urine odor and fall.she is here with her daughter [name] [name] who provides additional HPI information.she states patient fell 3 weeks ago and has had other multiples falls since then. No injuries to head.she complains of right side rib cage pain worst with movement.denies any difficulty breathing.Daughter states did not take her to ED since nothing will be done even if she has a broken rib.she has taken tylenol though daughter states tries to limit  tylenol use due to patient's kidney problems.latest labs reviewed liver enzymes were normal.discussed with daughter that Tylenol does not affect the Kidneys but the liver.verbalized understanding will increase tylenol frequency.  Also complains of left leg pain/weakness worst with walking.Unclear how long she has had the leg pain since she has had multiple falls. [place] has stopped follow up. She has had 5 lbs weight loss since previous visit 7 months ago.states appetite is good.  Daughter also states patient urine has a very strong odor.has been encouraging her to drink water.  Past Medical History:  Diagnosis Date  . Abnormality of gait   . Alzheimer's disease ([place])   . Calculus of kidney   . Closed fracture of seventh cervical vertebra without mention of spinal cord injury   . Depressive disorder, not elsewhere classified   . Edema   . Edema   . External hemorrhoids without mention of complication   . Gastric ulcer, unspecified as acute or chronic, without mention of hemorrhage, perforation, or obstruction   . GERD (gastroesophageal reflux disease)   . Hypertension   . Insomnia, unspecified   . Loss of weight   . Osteoarthrosis, unspecified whether generalized or localized, unspecified site   . Other protein-calorie malnutrition   . Pain in thoracic spine   . Personal history of fall   . Unspecified constipation   . Unspecified hereditary and idiopathic peripheral neuropathy   . Unspecified hypothyroidism   . Unspecified vitamin D deficiency   . Urinary frequency    Past Surgical History:  Procedure Laterality Date  .  TEE WITHOUT CARDIOVERSION N/A 11/13/2016   Procedure: TRANSESOPHAGEAL ECHOCARDIOGRAM (TEE);  Surgeon: Jerline Pain, MD;  Location: [facility];  Service: Cardiovascular;  Laterality: N/A;  . WRIST SURGERY Right 2013    No Known Allergies  Outpatient Encounter Medications as of 05/26/2019  Medication Sig  . acetaminophen (TYLENOL) 325 MG  tablet Take 2 tablets (650 mg total) by mouth every 6 (six) hours as needed.  Marland Kitchen amLODipine (NORVASC) 5 MG tablet Take 1 tablet (5 mg total) by mouth daily.  Marland Kitchen buPROPion (WELLBUTRIN XL) 150 MG 24 hr tablet TAKE 1 TABLET EVERY DAY  . levothyroxine (SYNTHROID) 88 MCG tablet TAKE ONE TABLET  ONCE DAILY 30 MINUTES BEFORE BREAKFAST FOR THYROID  . Memantine HCl-Donepezil HCl (NAMZARIC) 28-10 MG CP24 Take 1 capsule by mouth daily. Please call and schedule follow up appointment for more refills  . venlafaxine XR (EFFEXOR-XR) 75 MG 24 hr capsule TAKE 1 CAPSULE EVERY DAY WITH BREAKFAST Must make appointment for more refills  . Vitamin D, Ergocalciferol, (DRISDOL) 1.25 MG (50000 UT) CAPS capsule Take one capsule by mouth every 14 days  . [DISCONTINUED] doxycycline (VIBRA-TABS) 100 MG tablet Take 1 tablet (100 mg total) by mouth 2 (two) times daily.   No facility-administered encounter medications on file as of 05/26/2019.    Review of Systems  Constitutional: Negative for appetite change, chills, fatigue and fever.  HENT: Negative for congestion, rhinorrhea, sinus pressure, sinus pain, sneezing and sore throat.   Respiratory: Negative for cough, chest tightness, shortness of breath and wheezing.        Right rib cage pain   Cardiovascular: Negative for chest pain, palpitations and leg swelling.  Gastrointestinal: Negative for abdominal distention, abdominal pain, constipation, diarrhea, nausea and vomiting.  Genitourinary: Negative for difficulty urinating, dysuria, flank pain, frequency and urgency.  Musculoskeletal: Positive for arthralgias and gait problem.       Right side pain left knee /thigh pain   Skin: Negative for color change, pallor and rash.       Healing skin tear to right forearm   Neurological: Negative for dizziness, speech difficulty, light-headedness and headaches.  Psychiatric/Behavioral: Negative for agitation and sleep disturbance. The patient is not nervous/anxious.      Immunization History  Administered Date(s) Administered  . Influenza, High Dose Seasonal PF 12/10/2016  . Influenza,inj,Quad PF,6+ Mos 11/27/2015  . PPD Test 01/22/2015  . Pneumococcal Conjugate-13 11/27/2015  . Pneumococcal Polysaccharide-23 03/06/2017  . Tdap 05/19/2018   Pertinent  Health Maintenance Due  Topic Date Due  . INFLUENZA VACCINE  09/18/2019  . DEXA SCAN  Completed  . PNA vac Low Risk Adult  Completed   Fall Risk  05/26/2019 10/22/2018 10/18/2018 10/05/2018 03/26/2018  Falls in the past year? 1 1 1 1 1   Number falls in past yr: 1 1 1 1 1   Comment - - - - -  Injury with Fall? 1 1 1 1 1   Comment - - - Broken Finger -  Risk Factor Category  - - - - -  Risk for fall due to : - - - - -    Vitals:   05/26/19 1551  BP: 130/70  Pulse: 75  Temp: 97.7 F (36.5 C)  TempSrc: Oral  SpO2: 96%  Weight: 125 lb (56.7 kg)  Height: 5\' 4"  (1.626 m)   Body mass index is 21.46 kg/m. Physical Exam Vitals reviewed.  Constitutional:      General: She is not in acute distress.    Appearance: She  is normal weight. She is not ill-appearing.  Eyes:     General: No scleral icterus.       Right eye: No discharge.        Left eye: No discharge.     Extraocular Movements: Extraocular movements intact.     Conjunctiva/sclera: Conjunctivae normal.     Pupils: Pupils are equal, round, and reactive to light.  Neck:     Vascular: No carotid bruit.  Cardiovascular:     Rate and Rhythm: Normal rate and regular rhythm.     Pulses: Normal pulses.     Heart sounds: Normal heart sounds. No murmur. No friction rub. No gallop.   Pulmonary:     Effort: Pulmonary effort is normal. No respiratory distress.     Breath sounds: Normal breath sounds. No wheezing, rhonchi or rales.    Abdominal:     General: Bowel sounds are normal. There is no distension.     Palpations: Abdomen is soft. There is no mass.     Tenderness: There is no abdominal tenderness. There is no right CVA tenderness, left  CVA tenderness, guarding or rebound.  Musculoskeletal:        General: No swelling or deformity.     Cervical back: Normal range of motion. No rigidity or tenderness.     Right lower leg: No edema.     Left lower leg: No edema.     Comments: Unsteady gait.Observed limping when walking guarding left knee and thigh with walking and movement.  Lymphadenopathy:     Cervical: No cervical adenopathy.  Skin:    General: Skin is warm.     Coloration: Skin is not pale.     Findings: No rash.       Neurological:     Mental Status: Mental status is at baseline.     Gait: Gait abnormal.  Psychiatric:        Mood and Affect: Mood normal.        Speech: Speech normal.        Behavior: Behavior normal.        Thought Content: Thought content normal.     Labs reviewed: Recent Labs    08/25/18 1449 10/05/18 1619 11/16/18 1553  NA 141 141 138  K 3.0* 3.5 2.9*  CL 104 102 100  CO2 26 29 25   GLUCOSE 95 95 117*  BUN 13 18 14   CREATININE 0.77 1.03* 1.19*  CALCIUM 9.4 9.5 8.9   Recent Labs    08/25/18 1449 11/16/18 1553  AST 27 19  ALT 22 13  ALKPHOS 95 98  BILITOT 0.8 0.6  PROT 7.8 6.6  ALBUMIN 4.6 3.7   Recent Labs    10/05/18 1619 10/20/18 1614 11/16/18 1553  WBC 6.4 8.6 8.0  NEUTROABS 3,962 5,848 5.8  HGB 14.1 14.0 13.8  HCT 41.6 41.1 41.7  MCV 101.5* 101.0* 103.7*  PLT 262 270 215   Lab Results  Component Value Date   TSH 0.78 10/05/2018   Lab Results  Component Value Date   HGBA1C 5.2 11/11/2016   Lab Results  Component Value Date   CHOL 156 11/11/2016   HDL 61 11/11/2016   LDLCALC 87 11/11/2016   TRIG 42 11/11/2016   CHOLHDL 2.6 11/11/2016    Significant Diagnostic Results in last 30 days:  No results found.  Assessment/Plan 1. Symptoms of urinary tract infection Afebrile. - POC Urinalysis Dipstick results shows cloudy urine with strong odor,large blood and Large 3+ Leukocytes.Neative for  Nitrites.will send urine for culture and sensitivity. will  call you with results in 3 days.  - Advised to notify provider if running any fever > 100.5  - Increase water intake  - take over the counter AZO tablets daily  - Urine Culture  2. Rib pain on right side Rib cage very tender to touch and worst with movement.No difficulties breathing.will obtain imaging to rule out fracture.[facility] closed past 4: 63 Pm patient's daughter advised to get X-ray done at [facility] but has declined states does not want to park far then walking on long hallways in the hospital.she would like to wait until morning 05/27/2019 to have X-ray done at [facility] on [address] over [place].  - Take Extra strength Tylenol 500 mg tablet one by mouth every 6 hours as needed for pain  - DG Ribs Unilateral Right; Future  3. Acute pain of left knee Limping with walking and guarding knee and thigh.Groaning and moaning with walking.will obtain imaging.see above concerning imaging place.  -Take Extra strength Tylenol 500 mg tablet one by mouth every 6 hours as needed for pain  - DG Knee Complete 4 Views Left; Future  4. Acute pain of left thigh Guarding thigh with walking.will obtain imaging to rule out fracture.see above  - Take Extra strength Tylenol 500 mg tablet one by mouth every 6 hours as needed for pain  - DG FEMUR 1V LEFT; Future  5. Fall at home, subsequent encounter Has had multiple fall episodes.Dementia could be contributing to her frequent falls due to poor safety awareness  - DG Hip Unilat W OR W/O Pelvis 1V Left; Future  Family/ staff Communication: Reviewed plan of care with patient  Labs/tests ordered:  - Urine Culture - DG Ribs Unilateral Right; Future - DG Knee Complete 4 Views Left; Future - DG FEMUR 1V LEFT; Future - DG Hip Unilat W OR W/O Pelvis 1V Left; Future  Next Appointment: As needed if symptoms worsen or fail to improve.   Sandrea Hughs, NP

## 2019-05-27 LAB — URINE CULTURE
MICRO NUMBER:: 10343406
SPECIMEN QUALITY:: ADEQUATE

## 2019-05-30 ENCOUNTER — Other Ambulatory Visit: Payer: Self-pay

## 2019-05-30 ENCOUNTER — Other Ambulatory Visit: Payer: Medicare HMO | Admitting: Hospice

## 2019-05-30 DIAGNOSIS — Z515 Encounter for palliative care: Secondary | ICD-10-CM | POA: Diagnosis not present

## 2019-05-30 DIAGNOSIS — G308 Other Alzheimer's disease: Secondary | ICD-10-CM

## 2019-05-30 DIAGNOSIS — F0281 Dementia in other diseases classified elsewhere with behavioral disturbance: Secondary | ICD-10-CM

## 2019-05-30 NOTE — Progress Notes (Signed)
[facility] Consult Note Telephone: [phone]  Fax: [phone]  PATIENT NAME: [name] DOB: [birth year] MRN: [id number]  PRIMARY CARE PROVIDER:   [name], NP  REFERRING PROVIDER:  [name], NP [place],  [place] [address]   RESPONSIBLE PARTY:Primary Emergency Contact: [name],[name] Address: [address] [place], [place] [address] [place] Phone: [phone] Relation: Daughter Secondary Emergency Contact: Bottomley,Marsha Address: [address] [place], [place] [address] [place] Phone: [phone] Relation: Daughter  TELEHEALTH VISIT STATEMENT Due to the COVID-19 crisis, this visit was done via telephonefrom my office. It was initiated and consented to by this patient and/or family.  RECOMMENDATIONS/PLAN:  Advance Care Planning/Goals of Care: Telehealthvisit consisted of building trust andfollow up on palliative care.[place] DNR.Goals of care include to maximize quality of life and symptom management. Symptom management:Urinary symptoms last week, currently taking AZO tablets as ordered by PCP; [name] reports urine culture negative. Patient denies further urinary symptoms; no pain/discomfort; in no acute distress. NP encouraged adequate hydration to help address urinary color/odor. [name] verbalized understanding. Patient with worsening dementia, more confused than before, decline from FAST 6b to now FAST 6c, urinary incontinence. Privately paid caregiverscontinue tocome to the house 5 days a week; daughter [name] lives at home with patient. Patient ambulatory with rolling walker,able to feed self after set up,able to communicate her needs, requires assistance with ADLs. [name] affirmed patient is compliant with her medications. Ongoing care encouraged Follow [id number] care will continue to follow patient for  goals of care clarification and symptom management. I spent 36minutes providing this consultation.More than 50% of the time in this consultation was spent on coordinating communication   HISTORY OF PRESENT ILLNESS:Gabrielle M Hawksis a 86 y.o.year oldfemalewith multiple medical problems including Alzhiemer's dementia, frequent falls, PVD, h/o fractures, constipation. Palliative Care was asked to help address goals of care. CODE STATUS: DNR  PPS: 40% HOSPICE ELIGIBILITY/DIAGNOSIS: TBD  PAST MEDICAL HISTORY:  Past Medical History:  Diagnosis Date  . Abnormality of gait   . Alzheimer's disease ([place])   . Calculus of kidney   . Closed fracture of seventh cervical vertebra without mention of spinal cord injury   . Depressive disorder, not elsewhere classified   . Edema   . Edema   . External hemorrhoids without mention of complication   . Gastric ulcer, unspecified as acute or chronic, without mention of hemorrhage, perforation, or obstruction   . GERD (gastroesophageal reflux disease)   . Hypertension   . Insomnia, unspecified   . Loss of weight   . Osteoarthrosis, unspecified whether generalized or localized, unspecified site   . Other protein-calorie malnutrition   . Pain in thoracic spine   . Personal history of fall   . Unspecified constipation   . Unspecified hereditary and idiopathic peripheral neuropathy   . Unspecified hypothyroidism   . Unspecified vitamin D deficiency   . Urinary frequency     SOCIAL HX:  Social History   Tobacco Use  . Smoking status: Never Smoker  . Smokeless tobacco: Never Used  Substance Use Topics  . Alcohol use: No    ALLERGIES: No Known Allergies   PERTINENT MEDICATIONS:  Outpatient Encounter Medications as of 05/30/2019  Medication Sig  . acetaminophen (TYLENOL) 325 MG tablet Take 2 tablets (650 mg total) by mouth every 6 (six) hours as needed.  Marland Kitchen amLODipine (NORVASC) 5 MG tablet Take 1 tablet (5 mg total) by mouth  daily.  .  buPROPion (WELLBUTRIN XL) 150 MG 24 hr tablet TAKE 1 TABLET EVERY DAY  . levothyroxine (SYNTHROID) 88 MCG tablet TAKE ONE TABLET  ONCE DAILY 30 MINUTES BEFORE BREAKFAST FOR THYROID  . Memantine HCl-Donepezil HCl (NAMZARIC) 28-10 MG CP24 Take 1 capsule by mouth daily. Please call and schedule follow up appointment for more refills  . venlafaxine XR (EFFEXOR-XR) 75 MG 24 hr capsule TAKE 1 CAPSULE EVERY DAY WITH BREAKFAST Must make appointment for more refills  . Vitamin D, Ergocalciferol, (DRISDOL) 1.25 MG (50000 UT) CAPS capsule Take one capsule by mouth every 14 days   No facility-administered encounter medications on file as of 05/30/2019.    Teodoro Spray, NP

## 2019-06-03 ENCOUNTER — Ambulatory Visit: Payer: Medicare HMO | Attending: Internal Medicine

## 2019-06-03 DIAGNOSIS — Z23 Encounter for immunization: Secondary | ICD-10-CM

## 2019-06-03 NOTE — Progress Notes (Signed)
   Covid-19 Vaccination Clinic  Name:  [name]    MRN: [id number] DOB: [birth year]  06/03/2019  [name] was observed post Covid-19 immunization for 15 minutes without incident. She was provided with Vaccine Information Sheet and instruction to access the V-Safe system.   [name] was instructed to call 911 with any severe reactions post vaccine: Marland Kitchen Difficulty breathing  . Swelling of face and throat  . A fast heartbeat  . A bad rash all over body  . Dizziness and weakness   Immunizations Administered    Name Date Dose VIS Date Route   Pfizer COVID-19 Vaccine 06/03/2019  3:51 PM 0.3 mL 01/28/2019 Intramuscular   Manufacturer: [place]   Lot: [id number]   [place]: [id number]

## 2019-06-07 ENCOUNTER — Other Ambulatory Visit: Payer: Self-pay | Admitting: *Deleted

## 2019-06-07 DIAGNOSIS — I10 Essential (primary) hypertension: Secondary | ICD-10-CM

## 2019-06-07 MED ORDER — AMLODIPINE BESYLATE 5 MG PO TABS: 5.0000 mg | ORAL_TABLET | Freq: Every day | ORAL | 1 refills | Status: DC

## 2019-06-07 NOTE — Telephone Encounter (Signed)
Patient daughter requested refill to be sent to [facility].

## 2019-06-14 ENCOUNTER — Ambulatory Visit (INDEPENDENT_AMBULATORY_CARE_PROVIDER_SITE_OTHER): Payer: Medicare HMO | Admitting: Family

## 2019-06-14 ENCOUNTER — Encounter (HOSPITAL_COMMUNITY): Payer: Self-pay

## 2019-06-14 ENCOUNTER — Other Ambulatory Visit: Payer: Self-pay

## 2019-06-14 ENCOUNTER — Inpatient Hospital Stay (HOSPITAL_COMMUNITY)
Admission: EM | Admit: 2019-06-14 | Discharge: 2019-06-24 | DRG: 178 | Disposition: A | Discharge status: Skilled Nursing Facility | Payer: Medicare HMO | Attending: Internal Medicine | Admitting: Internal Medicine

## 2019-06-14 ENCOUNTER — Emergency Department (HOSPITAL_COMMUNITY): Payer: Medicare HMO

## 2019-06-14 ENCOUNTER — Encounter: Payer: Self-pay | Admitting: Family

## 2019-06-14 DIAGNOSIS — E559 Vitamin D deficiency, unspecified: Secondary | ICD-10-CM | POA: Diagnosis present

## 2019-06-14 DIAGNOSIS — E039 Hypothyroidism, unspecified: Secondary | ICD-10-CM | POA: Diagnosis present

## 2019-06-14 DIAGNOSIS — Z79890 Hormone replacement therapy (postmenopausal): Secondary | ICD-10-CM

## 2019-06-14 DIAGNOSIS — R29810 Facial weakness: Secondary | ICD-10-CM | POA: Diagnosis not present

## 2019-06-14 DIAGNOSIS — R1311 Dysphagia, oral phase: Secondary | ICD-10-CM | POA: Diagnosis not present

## 2019-06-14 DIAGNOSIS — K625 Hemorrhage of anus and rectum: Secondary | ICD-10-CM | POA: Diagnosis not present

## 2019-06-14 DIAGNOSIS — I10 Essential (primary) hypertension: Secondary | ICD-10-CM | POA: Diagnosis present

## 2019-06-14 DIAGNOSIS — R2689 Other abnormalities of gait and mobility: Secondary | ICD-10-CM | POA: Diagnosis not present

## 2019-06-14 DIAGNOSIS — U071 COVID-19: Principal | ICD-10-CM | POA: Diagnosis present

## 2019-06-14 DIAGNOSIS — R410 Disorientation, unspecified: Secondary | ICD-10-CM | POA: Diagnosis not present

## 2019-06-14 DIAGNOSIS — M2550 Pain in unspecified joint: Secondary | ICD-10-CM | POA: Diagnosis not present

## 2019-06-14 DIAGNOSIS — G609 Hereditary and idiopathic neuropathy, unspecified: Secondary | ICD-10-CM | POA: Diagnosis present

## 2019-06-14 DIAGNOSIS — E872 Acidosis: Secondary | ICD-10-CM | POA: Diagnosis present

## 2019-06-14 DIAGNOSIS — R531 Weakness: Secondary | ICD-10-CM

## 2019-06-14 DIAGNOSIS — R0902 Hypoxemia: Secondary | ICD-10-CM | POA: Diagnosis not present

## 2019-06-14 DIAGNOSIS — G309 Alzheimer's disease, unspecified: Secondary | ICD-10-CM | POA: Diagnosis present

## 2019-06-14 DIAGNOSIS — R262 Difficulty in walking, not elsewhere classified: Secondary | ICD-10-CM | POA: Diagnosis not present

## 2019-06-14 DIAGNOSIS — F329 Major depressive disorder, single episode, unspecified: Secondary | ICD-10-CM | POA: Diagnosis present

## 2019-06-14 DIAGNOSIS — E876 Hypokalemia: Secondary | ICD-10-CM | POA: Diagnosis present

## 2019-06-14 DIAGNOSIS — R29818 Other symptoms and signs involving the nervous system: Secondary | ICD-10-CM | POA: Diagnosis not present

## 2019-06-14 DIAGNOSIS — Z9181 History of falling: Secondary | ICD-10-CM

## 2019-06-14 DIAGNOSIS — R404 Transient alteration of awareness: Secondary | ICD-10-CM | POA: Diagnosis not present

## 2019-06-14 DIAGNOSIS — F29 Unspecified psychosis not due to a substance or known physiological condition: Secondary | ICD-10-CM | POA: Diagnosis not present

## 2019-06-14 DIAGNOSIS — M6259 Muscle wasting and atrophy, not elsewhere classified, multiple sites: Secondary | ICD-10-CM | POA: Diagnosis not present

## 2019-06-14 DIAGNOSIS — R2681 Unsteadiness on feet: Secondary | ICD-10-CM | POA: Diagnosis not present

## 2019-06-14 DIAGNOSIS — Z79899 Other long term (current) drug therapy: Secondary | ICD-10-CM | POA: Diagnosis not present

## 2019-06-14 DIAGNOSIS — M1990 Unspecified osteoarthritis, unspecified site: Secondary | ICD-10-CM | POA: Diagnosis present

## 2019-06-14 DIAGNOSIS — N939 Abnormal uterine and vaginal bleeding, unspecified: Secondary | ICD-10-CM | POA: Diagnosis not present

## 2019-06-14 DIAGNOSIS — F0281 Dementia in other diseases classified elsewhere with behavioral disturbance: Secondary | ICD-10-CM | POA: Diagnosis present

## 2019-06-14 DIAGNOSIS — F02818 Dementia in other diseases classified elsewhere, unspecified severity, with other behavioral disturbance: Secondary | ICD-10-CM | POA: Diagnosis present

## 2019-06-14 DIAGNOSIS — Z87442 Personal history of urinary calculi: Secondary | ICD-10-CM

## 2019-06-14 DIAGNOSIS — S299XXA Unspecified injury of thorax, initial encounter: Secondary | ICD-10-CM | POA: Diagnosis not present

## 2019-06-14 DIAGNOSIS — S0003XA Contusion of scalp, initial encounter: Secondary | ICD-10-CM | POA: Diagnosis not present

## 2019-06-14 DIAGNOSIS — N390 Urinary tract infection, site not specified: Secondary | ICD-10-CM | POA: Diagnosis present

## 2019-06-14 DIAGNOSIS — E860 Dehydration: Secondary | ICD-10-CM | POA: Diagnosis present

## 2019-06-14 DIAGNOSIS — E785 Hyperlipidemia, unspecified: Secondary | ICD-10-CM | POA: Diagnosis present

## 2019-06-14 DIAGNOSIS — D329 Benign neoplasm of meninges, unspecified: Secondary | ICD-10-CM | POA: Diagnosis not present

## 2019-06-14 DIAGNOSIS — Z7401 Bed confinement status: Secondary | ICD-10-CM | POA: Diagnosis not present

## 2019-06-14 DIAGNOSIS — G9340 Encephalopathy, unspecified: Secondary | ICD-10-CM | POA: Diagnosis not present

## 2019-06-14 DIAGNOSIS — Z66 Do not resuscitate: Secondary | ICD-10-CM | POA: Diagnosis present

## 2019-06-14 LAB — COMPREHENSIVE METABOLIC PANEL
ALT: 17 U/L (ref 0–44)
AST: 20 U/L (ref 15–41)
Albumin: 3.8 g/dL (ref 3.5–5.0)
Alkaline Phosphatase: 106 U/L (ref 38–126)
Anion gap: 10 (ref 5–15)
BUN: 20 mg/dL (ref 8–23)
CO2: 24 mmol/L (ref 22–32)
Calcium: 8.4 mg/dL — ABNORMAL LOW (ref 8.9–10.3)
Chloride: 106 mmol/L (ref 98–111)
Creatinine, Ser: 1.07 mg/dL — ABNORMAL HIGH (ref 0.44–1.00)
GFR calc Af Amer: 54 mL/min — ABNORMAL LOW (ref >60)
GFR calc non Af Amer: 47 mL/min — ABNORMAL LOW (ref >60)
Glucose, Bld: 105 mg/dL — ABNORMAL HIGH (ref 70–99)
Potassium: 2.9 mmol/L — ABNORMAL LOW (ref 3.5–5.1)
Sodium: 140 mmol/L (ref 135–145)
Total Bilirubin: 0.4 mg/dL (ref 0.3–1.2)
Total Protein: 7.2 g/dL (ref 6.5–8.1)

## 2019-06-14 LAB — URINALYSIS, ROUTINE W REFLEX MICROSCOPIC
Bilirubin Urine: NEGATIVE
Glucose, UA: NEGATIVE mg/dL
Hgb urine dipstick: NEGATIVE
Ketones, ur: NEGATIVE mg/dL
Nitrite: NEGATIVE
Protein, ur: 100 mg/dL — AB
Specific Gravity, Urine: 1.014 (ref 1.005–1.030)
WBC, UA: >50 WBC/hpf — ABNORMAL HIGH (ref 0–5)
pH: 8 (ref 5.0–8.0)

## 2019-06-14 LAB — CBC WITH DIFFERENTIAL/PLATELET
Abs Immature Granulocytes: 0.05 10*3/uL (ref 0.00–0.07)
Basophils Absolute: 0 10*3/uL (ref 0.0–0.1)
Basophils Relative: 1 %
Eosinophils Absolute: 0 10*3/uL (ref 0.0–0.5)
Eosinophils Relative: 0 %
HCT: 41.3 % (ref 36.0–46.0)
Hemoglobin: 13.5 g/dL (ref 12.0–15.0)
Immature Granulocytes: 1 %
Lymphocytes Relative: 21 %
Lymphs Abs: 1.3 10*3/uL (ref 0.7–4.0)
MCH: 34 pg (ref 26.0–34.0)
MCHC: 32.7 g/dL (ref 30.0–36.0)
MCV: 104 fL — ABNORMAL HIGH (ref 80.0–100.0)
Monocytes Absolute: 1.1 10*3/uL — ABNORMAL HIGH (ref 0.1–1.0)
Monocytes Relative: 18 %
Neutro Abs: 3.7 10*3/uL (ref 1.7–7.7)
Neutrophils Relative %: 59 %
Platelets: 194 10*3/uL (ref 150–400)
RBC: 3.97 MIL/uL (ref 3.87–5.11)
RDW: 13.3 % (ref 11.5–15.5)
WBC: 6.2 10*3/uL (ref 4.0–10.5)
nRBC: 0 % (ref 0.0–0.2)

## 2019-06-14 MED ORDER — SODIUM CHLORIDE 0.9 % IV SOLN
1.0000 g | Freq: Once | INTRAVENOUS | Status: AC
  Administered 2019-06-14: 1 g via INTRAVENOUS
  Filled 2019-06-14: qty 10

## 2019-06-14 MED ORDER — POTASSIUM CHLORIDE CRYS ER 20 MEQ PO TBCR
40.0000 meq | EXTENDED_RELEASE_TABLET | Freq: Once | ORAL | Status: AC
  Administered 2019-06-14: 40 meq via ORAL
  Filled 2019-06-14: qty 2

## 2019-06-14 MED ORDER — SODIUM CHLORIDE 0.9 % IV BOLUS
1000.0000 mL | Freq: Once | INTRAVENOUS | Status: AC
  Administered 2019-06-14: 1000 mL via INTRAVENOUS

## 2019-06-14 MED ORDER — POTASSIUM CHLORIDE IN NACL 20-0.9 MEQ/L-% IV SOLN
Freq: Once | INTRAVENOUS | Status: AC
  Filled 2019-06-14: qty 1000

## 2019-06-14 MED ORDER — FOSFOMYCIN TROMETHAMINE 3 G PO PACK
3.0000 g | PACK | Freq: Once | ORAL | Status: DC
  Filled 2019-06-14: qty 3

## 2019-06-14 NOTE — ED Triage Notes (Signed)
Hx of dementia 

## 2019-06-14 NOTE — Patient Instructions (Signed)
Please send to ED via EMS as soon as possible for further evaluation of left side weakness,Generalized weakness and unsteady gait.

## 2019-06-14 NOTE — ED Notes (Signed)
Pt ambulatory to RR with 2 person assist.

## 2019-06-14 NOTE — ED Provider Notes (Signed)
[facility] DEPT Provider Note   CSN: [id number] Arrival date & time: 06/14/19  1717     History Chief Complaint  Patient presents with  . Weakness    [name] is a 86 y.o. female.  HPI   Patient presents with concern of weakness, anorexia. Initially patient was alone, attempting to provide her own history, but this is limited secondary to dementia, level 5 caveat. After my initial evaluation patient in which she denies pain, states that she is not sure why she is here, I spoke with her daughter who joined [place] at bedside. Daughter notes that the patient has a history of dementia, but has been doing generally well into the past few days.  Now, over this time she has had progressively less interactive, with more anorexia, and possibly with worsening dragging of her right leg.  It seems as though this has been present for some time, possibly more pronounced. No reported no fever, no fall.  She did, however a fall a few weeks ago, resulting in minor head trauma.   Past Medical History:  Diagnosis Date  . Abnormality of gait   . Alzheimer's disease ([place])   . Calculus of kidney   . Closed fracture of seventh cervical vertebra without mention of spinal cord injury   . Depressive disorder, not elsewhere classified   . Edema   . Edema   . External hemorrhoids without mention of complication   . Gastric ulcer, unspecified as acute or chronic, without mention of hemorrhage, perforation, or obstruction   . GERD (gastroesophageal reflux disease)   . Hypertension   . Insomnia, unspecified   . Loss of weight   . Osteoarthrosis, unspecified whether generalized or localized, unspecified site   . Other protein-calorie malnutrition   . Pain in thoracic spine   . Personal history of fall   . Unspecified constipation   . Unspecified hereditary and idiopathic peripheral neuropathy   . Unspecified hypothyroidism   . Unspecified vitamin D deficiency   .  Urinary frequency     Patient Active Problem List   Diagnosis Date Noted  . BRBPR (bright red blood per rectum) 06/30/2017  . Colitis 06/30/2017  . Protein-calorie malnutrition, severe 11/13/2016  . Multiple falls   . Encephalopathy   . Agitation   . Slurred speech 11/10/2016  . Vitamin D deficiency 03/07/2015  . Hx of fracture of vertebral column 12/28/2014  . Constipation 07/27/2013  . Fracture of vertebra 07/27/2013  . Loss of weight 07/13/2013  . Peripheral vascular disease, unspecified ([place]) 07/13/2013  . Epigastric mass 07/13/2013  . Dementia with behavioral disturbance ([place])   . Major depression ([place])   . Insomnia, unspecified   . Fall 09/07/2012  . Fracture of right distal radius 09/07/2012  . Hypothyroidism 08/18/2006  . HYPERCHOLESTEROLEMIA 08/18/2006  . Essential hypertension 08/18/2006  . Restless legs 08/18/2006    Past Surgical History:  Procedure Laterality Date  . TEE WITHOUT CARDIOVERSION N/A 11/13/2016   Procedure: TRANSESOPHAGEAL ECHOCARDIOGRAM (TEE);  Surgeon: Jerline Pain, MD;  Location: [facility] ENDOSCOPY;  Service: Cardiovascular;  Laterality: N/A;  . WRIST SURGERY Right 2013     OB History   No obstetric history on file.     Family History  Problem Relation Age of Onset  . Cancer Mother   . CVA Father   . Cancer Brother   . Alcohol abuse Sister     Social History   Tobacco Use  . Smoking status: Never Smoker  .  Smokeless tobacco: Never Used  Substance Use Topics  . Alcohol use: No  . Drug use: No    Home Medications Prior to Admission medications   Medication Sig Start Date End Date Taking? Authorizing Provider  acetaminophen (TYLENOL) 500 MG tablet Take 500 mg by mouth as needed for moderate pain.   Yes [provider]  amLODipine (NORVASC) 5 MG tablet Take 1 tablet (5 mg total) by mouth daily. 06/07/19  Yes [name], NP  buPROPion (WELLBUTRIN XL) 150 MG 24 hr tablet TAKE 1 TABLET EVERY DAY Patient taking  differently: Take 150 mg by mouth at bedtime.  02/16/19  Yes [name], NP  levothyroxine (SYNTHROID) 88 MCG tablet TAKE ONE TABLET  ONCE DAILY 30 MINUTES BEFORE BREAKFAST FOR THYROID Patient taking differently: Take 88 mcg by mouth daily before breakfast.  02/16/19  Yes [name], NP  Memantine HCl-Donepezil HCl (NAMZARIC) 28-10 MG CP24 Take 1 capsule by mouth daily. Please call and schedule follow up appointment for more refills Patient taking differently: Take 1 capsule by mouth at bedtime. Please call and schedule follow up appointment for more refills 02/28/19  Yes [name], NP  venlafaxine XR (EFFEXOR-XR) 75 MG 24 hr capsule TAKE 1 CAPSULE EVERY DAY WITH BREAKFAST Must make appointment for more refills Patient taking differently: Take 75 mg by mouth daily with breakfast. Must make appointment for more refills 02/28/19  Yes [name], NP  Vitamin D, Ergocalciferol, (DRISDOL) 1.25 MG (50000 UT) CAPS capsule Take one capsule by mouth every 14 days Patient taking differently: Take 50,000 Units by mouth every 14 (fourteen) days.  10/06/18  Yes [name], NP  acetaminophen (TYLENOL) 325 MG tablet Take 2 tablets (650 mg total) by mouth every 6 (six) hours as needed. Patient not taking: Reported on 06/14/2019 07/24/17   Jacqlyn Larsen, PA-C    Allergies    Patient has no known allergies.  Review of Systems   Review of Systems  Unable to perform ROS: Dementia    Physical Exam Updated Vital Signs BP (!) 154/97   Pulse 90   Temp 98.5 F (36.9 C) (Oral)   Resp (!) 22   SpO2 94%   Physical Exam Vitals and nursing note reviewed.  Constitutional:      General: She is not in acute distress.    Appearance: She is well-developed.  HENT:     Head: Normocephalic and atraumatic.  Eyes:     Conjunctiva/sclera: Conjunctivae normal.  Cardiovascular:     Rate and Rhythm: Normal rate and regular rhythm.  Pulmonary:     Effort: Pulmonary effort is normal.  No respiratory distress.     Breath sounds: Normal breath sounds. No stridor.  Abdominal:     General: There is no distension.  Skin:    General: Skin is warm and dry.  Neurological:     Mental Status: She is alert.     Cranial Nerves: No cranial nerve deficit.     Motor: Weakness, tremor and atrophy present.     Comments: MAES, LE strength 4/5 in L, slightly weaker in R  Psychiatric:        Behavior: Behavior is slowed and withdrawn.        Cognition and Memory: Cognition is impaired. Memory is impaired.     ED Results / Procedures / Treatments   Labs (all labs ordered are listed, but only abnormal results are displayed) Labs Reviewed  COMPREHENSIVE METABOLIC PANEL - Abnormal;  Notable for the following components:      Result Value   Potassium 2.9 (*)    Glucose, Bld 105 (*)    Creatinine, Ser 1.07 (*)    Calcium 8.4 (*)    GFR calc non Af Amer 47 (*)    GFR calc Af Amer 54 (*)    All other components within normal limits  CBC WITH DIFFERENTIAL/PLATELET - Abnormal; Notable for the following components:   MCV 104.0 (*)    Monocytes Absolute 1.1 (*)    All other components within normal limits  URINALYSIS, ROUTINE W REFLEX MICROSCOPIC - Abnormal; Notable for the following components:   APPearance TURBID (*)    Protein, ur 100 (*)    Leukocytes,Ua LARGE (*)    WBC, UA >50 (*)    Bacteria, UA MANY (*)    All other components within normal limits  RESPIRATORY PANEL BY RT PCR (FLU A&B, COVID)    EKG None  Radiology CT Head Wo Contrast  Result Date: 06/14/2019 CLINICAL DATA:  Encephalopathy. Generalized weakness. EXAM: CT HEAD WITHOUT CONTRAST TECHNIQUE: Contiguous axial images were obtained from the base of the skull through the vertex without intravenous contrast. COMPARISON:  11/16/2018 FINDINGS: Brain: No intracranial hemorrhage, mass effect, or midline shift. Generalized atrophy. No hydrocephalus. The basilar cisterns are patent. Moderate to advanced chronic small  vessel ischemia. No evidence of territorial infarct or acute ischemia. Stable calcified left posterior fossa wall 10 x 7 mm meningioma. No extra-axial or intracranial fluid collection. Vascular: Atherosclerosis of skullbase vasculature without hyperdense vessel or abnormal calcification. Skull: No fracture or focal lesion. Sinuses/Orbits: Paranasal sinuses and mastoid air cells are clear. The visualized orbits are unremarkable. Bilateral cataract resection. Other: None. IMPRESSION: 1. No acute intracranial abnormality. 2. Stable atrophy and chronic small vessel ischemia. 3. Stable small left posterior fossa meningioma. Electronically Signed   By: Keith Rake M.D.   On: 06/14/2019 19:09   DG Chest Port 1 View  Result Date: 06/14/2019 CLINICAL DATA:  Weakness and falling EXAM: PORTABLE CHEST 1 VIEW COMPARISON:  11/16/2018 FINDINGS: Artifact overlies the chest. Heart size is normal. Chronic aortic atherosclerosis. Lungs are clear allowing for the AP semi-erect positioning. No evidence of heart failure or effusion. Chronic spinal curvature. IMPRESSION: No active disease.  Aortic atherosclerosis. Electronically Signed   By: Nelson Chimes M.D.   On: 06/14/2019 19:15    Procedures Procedures (including critical care time)  Medications Ordered in ED Medications  cefTRIAXone (ROCEPHIN) 1 g in sodium chloride 0.9 % 100 mL IVPB (has no administration in time range)  potassium chloride SA (KLOR-CON) CR tablet 40 mEq (has no administration in time range)  0.9 % NaCl with KCl 20 mEq/ L  infusion (has no administration in time range)  sodium chloride 0.9 % bolus 1,000 mL (1,000 mLs Intravenous New Bag/Given 06/14/19 2145)    ED Course  I have reviewed the triage vital signs and the nursing notes.  Pertinent labs & imaging results that were available during my care of the patient were reviewed by me and considered in my medical decision making (see chart for details).   11:11 PM Patient in similar  condition.  She continues to seemingly have lower extremity weakness, possibly right greater than left, though her dementia makes this difficult to completely ascertain. I discussed all findings with the patient's daughter.  In particular we discussed evidence for urinary tract infection, hypokalemia, and given concern for weakness, worsening interactivity, delirium which may be secondary to her  urinary tract infection.  No early evidence for bacteremia, sepsis.  Given the need for repletion of her potassium, and treatment of her infection, consideration of her delirium, with possible evaluation of lower extremity weakness, which may have been a stroke that occurred previously given the unknown certain timeframe about her lower extremity weakness, patient will be admitted for further monitoring, management.  Final Clinical Impression(s) / ED Diagnoses Final diagnoses:  Weakness  Hypokalemia  Lower urinary tract infectious disease     Carmin Muskrat, MD 06/14/19 2313

## 2019-06-14 NOTE — Progress Notes (Signed)
This service is provided via telemedicine  No vital signs collected/recorded due to the encounter was a telemedicine visit.   Location of patient (ex: home, work): Home.  Patient consents to a telephone visit: Yes.  Location of the provider (ex: office, home): [facility].   Name of any referring provider: N/A  Names of all persons participating in the telemedicine service and their role in the encounter:  Patient, [name] Daughter, Heriberto Antigua, [place]me[place], Marlowe Sax, NP.    Time spent on call: 8 minutes spent on the phone with Medical Assistant.    Provider: Tarah Buboltz FNP-C  [name], NP  Patient Care Team: [name], NP as PCP - General (Geriatric Medicine) Berneta Sages, NP as Nurse Practitioner [facility] and Palliative Medicine)  Extended Emergency Contact Information Primary Emergency Contact: [name],[name] Address: [address]          [place], [place] [address] [place] Phone: [phone] Mobile Phone: [phone] Relation: Daughter Secondary Emergency Contact: Bottomley,Marsha Address: [address]          [place], [place] [address] [place] Phone: [phone] Relation: Daughter  Code Status:  DNR Goals of care: Advanced Directive information Advanced Directives 06/14/2019  Does Patient Have a Medical Advance Directive? Yes  Type of Advance Directive Living will;Healthcare Power of [place];Out of facility DNR (pink MOST or yellow form)  Does patient want to make changes to medical advance directive? No - Patient declined  Copy of [place] in Chart? Yes - validated most recent copy scanned in chart (See row information)  Would patient like information on creating a medical advance directive? -  Pre-existing out of facility DNR order (yellow form or pink MOST form) -     Chief Complaint  Patient presents with  . Acute Visit    Complaints of sliding, feet  jerking, can barely walk since yesterday morning.     HPI:  Pt is a 86 y.o. female seen today for an acute visit for evaluation of sliding, feet jerking, can barely walk since yesterday morning.HPI information provided by patient's daughter.States patient has had changes in her gait since yesterday seems to shuffle and cannot get leg up and cannot sit upright.she woke up this morning could get out of bed.when she got up she shuffled to her chair and just flopped on the chair.she tends to lean on her left side. Has required two person assistance to walk.Her regular seater had to get another person to assist with her transfer being held under both hands to walk and transfer.Usually walks with her walker. Her seater had to hold her milk to the mouth.she swallowed her medication without any difficulties. She answers to yes or no questions but does not carry on a conversation which is new to patient.  Daughter wonders whether patient could have had a mini-stroke though states patient has no facial drooping or slurred speech.She states no fever,chills or symptoms of urinary tract infection.states patient did cough in the morning but none since then.  She lying quietly in bed during visit.  Daughter states wanted to notify provider of the changes but will wait and see if symptoms worsen will send to hospital.States her sister works at the ED and told her there's a 8 Hour wait.she is concerned that  nothing will be done for her if she goes to the ED.States probably a CT scan then she will be send home.I've discussed with daughter early intervention  if possible TIA/Stroke states will contact her sister who will be coming out of work at 6 pm.    Past Medical History:  Diagnosis Date  . Abnormality of gait   . Alzheimer's disease ([place])   . Calculus of kidney   . Closed fracture of seventh cervical vertebra without mention of spinal cord injury   . Depressive disorder, not elsewhere classified   . Edema   .  Edema   . External hemorrhoids without mention of complication   . Gastric ulcer, unspecified as acute or chronic, without mention of hemorrhage, perforation, or obstruction   . GERD (gastroesophageal reflux disease)   . Hypertension   . Insomnia, unspecified   . Loss of weight   . Osteoarthrosis, unspecified whether generalized or localized, unspecified site   . Other protein-calorie malnutrition   . Pain in thoracic spine   . Personal history of fall   . Unspecified constipation   . Unspecified hereditary and idiopathic peripheral neuropathy   . Unspecified hypothyroidism   . Unspecified vitamin D deficiency   . Urinary frequency    Past Surgical History:  Procedure Laterality Date  . TEE WITHOUT CARDIOVERSION N/A 11/13/2016   Procedure: TRANSESOPHAGEAL ECHOCARDIOGRAM (TEE);  Surgeon: Jerline Pain, MD;  Location: [facility] ENDOSCOPY;  Service: Cardiovascular;  Laterality: N/A;  . WRIST SURGERY Right 2013    No Known Allergies  Outpatient Encounter Medications as of 06/14/2019  Medication Sig  . acetaminophen (TYLENOL) 325 MG tablet Take 2 tablets (650 mg total) by mouth every 6 (six) hours as needed.  Marland Kitchen amLODipine (NORVASC) 5 MG tablet Take 1 tablet (5 mg total) by mouth daily.  Marland Kitchen buPROPion (WELLBUTRIN XL) 150 MG 24 hr tablet TAKE 1 TABLET EVERY DAY  . levothyroxine (SYNTHROID) 88 MCG tablet TAKE ONE TABLET  ONCE DAILY 30 MINUTES BEFORE BREAKFAST FOR THYROID  . Memantine HCl-Donepezil HCl (NAMZARIC) 28-10 MG CP24 Take 1 capsule by mouth daily. Please call and schedule follow up appointment for more refills  . venlafaxine XR (EFFEXOR-XR) 75 MG 24 hr capsule TAKE 1 CAPSULE EVERY DAY WITH BREAKFAST Must make appointment for more refills  . Vitamin D, Ergocalciferol, (DRISDOL) 1.25 MG (50000 UT) CAPS capsule Take one capsule by mouth every 14 days   No facility-administered encounter medications on file as of 06/14/2019.    Review of Systems  Unable to perform ROS: Acuity of condition  (Information provided by patient's daughter)  Constitutional: Positive for fatigue. Negative for appetite change, chills and fever.  HENT: Negative for congestion, postnasal drip, rhinorrhea, sinus pressure, sinus pain, sneezing and sore throat.   Eyes: Negative for discharge, redness and itching.  Respiratory: Negative for cough, chest tightness, shortness of breath and wheezing.   Cardiovascular: Negative for chest pain, palpitations and leg swelling.  Gastrointestinal: Negative for abdominal distention, abdominal pain, constipation, diarrhea, nausea and vomiting.  Endocrine: Negative for cold intolerance, heat intolerance, polydipsia, polyphagia and polyuria.  Genitourinary:       Incontinent   Musculoskeletal: Positive for gait problem.       Shuffling   Neurological: Negative for dizziness, speech difficulty, light-headedness and headaches.  Psychiatric/Behavioral: Negative for agitation and confusion. The patient is not nervous/anxious.     Immunization History  Administered Date(s) Administered  . Influenza, High Dose Seasonal PF 12/10/2016  . Influenza,inj,Quad PF,6+ Mos 11/27/2015  . PFIZER SARS-COV-2 Vaccination 06/03/2019  . PPD Test 01/22/2015  . Pneumococcal Conjugate-13 11/27/2015  . Pneumococcal Polysaccharide-23 03/06/2017  . Tdap 05/19/2018   Pertinent  Health Maintenance Due  Topic Date Due  . INFLUENZA VACCINE  09/18/2019  . DEXA SCAN  Completed  . PNA vac Low Risk Adult  Completed   Fall Risk  06/14/2019 05/26/2019 10/22/2018 10/18/2018 10/05/2018  Falls in the past year? 1 1 1 1 1   Number falls in past yr: 1 1 1 1 1   Comment - - - - -  Injury with Fall? 1 1 1 1 1   Comment - - - - Broken Finger  Risk Factor Category  - - - - -  Risk for fall due to : - - - - -   There were no vitals filed for this visit. There is no height or weight on file to calculate BMI. Physical Exam  Unable to complete on telephone visit.   Labs reviewed: Recent Labs     08/25/18 1449 10/05/18 1619 11/16/18 1553  NA 141 141 138  K 3.0* 3.5 2.9*  CL 104 102 100  CO2 26 29 25   GLUCOSE 95 95 117*  BUN 13 18 14   CREATININE 0.77 1.03* 1.19*  CALCIUM 9.4 9.5 8.9   Recent Labs    08/25/18 1449 11/16/18 1553  AST 27 19  ALT 22 13  ALKPHOS 95 98  BILITOT 0.8 0.6  PROT 7.8 6.6  ALBUMIN 4.6 3.7   Recent Labs    10/05/18 1619 10/20/18 1614 11/16/18 1553  WBC 6.4 8.6 8.0  NEUTROABS 3,962 5,848 5.8  HGB 14.1 14.0 13.8  HCT 41.6 41.1 41.7  MCV 101.5* 101.0* 103.7*  PLT 262 270 215   Lab Results  Component Value Date   TSH 0.78 10/05/2018   Lab Results  Component Value Date   HGBA1C 5.2 11/11/2016   Lab Results  Component Value Date   CHOL 156 11/11/2016   HDL 61 11/11/2016   LDLCALC 87 11/11/2016   TRIG 42 11/11/2016   CHOLHDL 2.6 11/11/2016    Significant Diagnostic Results in last 30 days:  No results found.  Assessment/Plan 1. Left-sided weakness New onset since 06/14/2019.Gait described as shuffle.Answer yes or no question but does not carry on conversation as usually. - Advised to send patient to ED via EMS  ASAP for further evaluation due to new onset of symptoms.  2. Generalized weakness New onset of weakness.has required two person assist with her transfer.send to ED as above.  3. Unsteady gait Usually walks with her walker but now requires two person assist with transfer. - Advised daughter to send to the ED via EMS ASAP for further evaluation.she verbalized understanding will contact her sister.    Family/ staff Communication: Reviewed plan of care with patient's daughter. advised daughter to send to the ED via EMS ASAP for further evaluation.she verbalized understanding will contact her sister.   Labs/tests ordered: None advised to send to ED via EMS   Next Appointment: Advised to send to ED.   Spent 11 minutes of non-face to face with patient   I connected with  Billey Chang  Daughter on 06/14/19 by a Telephone  enabled telemedicine application and verified that I am speaking with the correct person using two identifiers.   I discussed the limitations of evaluation and management by telemedicine. The patient expressed understanding and agreed to proceed.  Sandrea Hughs, NP

## 2019-06-14 NOTE — ED Triage Notes (Signed)
Per EMS, Pt is coming from home. Pt is complaining of generalized weakness for apprx 24 hrs. Stong smelling urine for the last couple weeks.

## 2019-06-15 ENCOUNTER — Observation Stay (HOSPITAL_COMMUNITY): Payer: Medicare HMO

## 2019-06-15 ENCOUNTER — Encounter (HOSPITAL_COMMUNITY): Payer: Self-pay | Admitting: Internal Medicine

## 2019-06-15 DIAGNOSIS — E559 Vitamin D deficiency, unspecified: Secondary | ICD-10-CM | POA: Diagnosis present

## 2019-06-15 DIAGNOSIS — Z79899 Other long term (current) drug therapy: Secondary | ICD-10-CM | POA: Diagnosis not present

## 2019-06-15 DIAGNOSIS — F02818 Dementia in other diseases classified elsewhere, unspecified severity, with other behavioral disturbance: Secondary | ICD-10-CM | POA: Diagnosis present

## 2019-06-15 DIAGNOSIS — N390 Urinary tract infection, site not specified: Secondary | ICD-10-CM | POA: Diagnosis present

## 2019-06-15 DIAGNOSIS — I10 Essential (primary) hypertension: Secondary | ICD-10-CM | POA: Diagnosis present

## 2019-06-15 DIAGNOSIS — R531 Weakness: Secondary | ICD-10-CM | POA: Diagnosis not present

## 2019-06-15 DIAGNOSIS — E785 Hyperlipidemia, unspecified: Secondary | ICD-10-CM | POA: Diagnosis present

## 2019-06-15 DIAGNOSIS — E872 Acidosis: Secondary | ICD-10-CM | POA: Diagnosis present

## 2019-06-15 DIAGNOSIS — R29818 Other symptoms and signs involving the nervous system: Secondary | ICD-10-CM | POA: Diagnosis not present

## 2019-06-15 DIAGNOSIS — Z87442 Personal history of urinary calculi: Secondary | ICD-10-CM | POA: Diagnosis not present

## 2019-06-15 DIAGNOSIS — F0281 Dementia in other diseases classified elsewhere with behavioral disturbance: Secondary | ICD-10-CM | POA: Diagnosis present

## 2019-06-15 DIAGNOSIS — F329 Major depressive disorder, single episode, unspecified: Secondary | ICD-10-CM | POA: Diagnosis present

## 2019-06-15 DIAGNOSIS — Z9181 History of falling: Secondary | ICD-10-CM | POA: Diagnosis not present

## 2019-06-15 DIAGNOSIS — E860 Dehydration: Secondary | ICD-10-CM | POA: Diagnosis present

## 2019-06-15 DIAGNOSIS — G609 Hereditary and idiopathic neuropathy, unspecified: Secondary | ICD-10-CM | POA: Diagnosis present

## 2019-06-15 DIAGNOSIS — G309 Alzheimer's disease, unspecified: Secondary | ICD-10-CM | POA: Diagnosis present

## 2019-06-15 DIAGNOSIS — Z79890 Hormone replacement therapy (postmenopausal): Secondary | ICD-10-CM | POA: Diagnosis not present

## 2019-06-15 DIAGNOSIS — N939 Abnormal uterine and vaginal bleeding, unspecified: Secondary | ICD-10-CM | POA: Diagnosis not present

## 2019-06-15 DIAGNOSIS — U071 COVID-19: Secondary | ICD-10-CM | POA: Diagnosis present

## 2019-06-15 DIAGNOSIS — D329 Benign neoplasm of meninges, unspecified: Secondary | ICD-10-CM | POA: Diagnosis not present

## 2019-06-15 DIAGNOSIS — E876 Hypokalemia: Secondary | ICD-10-CM | POA: Diagnosis present

## 2019-06-15 DIAGNOSIS — M1990 Unspecified osteoarthritis, unspecified site: Secondary | ICD-10-CM | POA: Diagnosis present

## 2019-06-15 DIAGNOSIS — Z66 Do not resuscitate: Secondary | ICD-10-CM | POA: Diagnosis present

## 2019-06-15 DIAGNOSIS — E039 Hypothyroidism, unspecified: Secondary | ICD-10-CM | POA: Diagnosis present

## 2019-06-15 DIAGNOSIS — R262 Difficulty in walking, not elsewhere classified: Secondary | ICD-10-CM | POA: Diagnosis not present

## 2019-06-15 LAB — BASIC METABOLIC PANEL
Anion gap: 9 (ref 5–15)
BUN: 12 mg/dL (ref 8–23)
CO2: 25 mmol/L (ref 22–32)
Calcium: 8.3 mg/dL — ABNORMAL LOW (ref 8.9–10.3)
Chloride: 104 mmol/L (ref 98–111)
Creatinine, Ser: 0.75 mg/dL (ref 0.44–1.00)
GFR calc Af Amer: >60 mL/min (ref >60)
GFR calc non Af Amer: >60 mL/min (ref >60)
Glucose, Bld: 95 mg/dL (ref 70–99)
Potassium: 3.2 mmol/L — ABNORMAL LOW (ref 3.5–5.1)
Sodium: 138 mmol/L (ref 135–145)

## 2019-06-15 LAB — LACTIC ACID, PLASMA
Lactic Acid, Venous: 0.8 mmol/L (ref 0.5–1.9)
Lactic Acid, Venous: 1.6 mmol/L (ref 0.5–1.9)

## 2019-06-15 LAB — FERRITIN: Ferritin: 57 ng/mL (ref 11–307)

## 2019-06-15 LAB — LIPID PANEL
Cholesterol: 191 mg/dL (ref 0–200)
HDL: 63 mg/dL (ref >40)
LDL Cholesterol: 115 mg/dL — ABNORMAL HIGH (ref 0–99)
Total CHOL/HDL Ratio: 3 RATIO
Triglycerides: 64 mg/dL (ref <150)
VLDL: 13 mg/dL (ref 0–40)

## 2019-06-15 LAB — RESPIRATORY PANEL BY RT PCR (FLU A&B, COVID)
Influenza A by PCR: NEGATIVE
Influenza B by PCR: NEGATIVE
SARS Coronavirus 2 by RT PCR: POSITIVE — AB

## 2019-06-15 LAB — LACTATE DEHYDROGENASE: LDH: 219 U/L — ABNORMAL HIGH (ref 98–192)

## 2019-06-15 LAB — VITAMIN B12: Vitamin B-12: 1368 pg/mL — ABNORMAL HIGH (ref 180–914)

## 2019-06-15 LAB — ABO/RH: ABO/RH(D): O NEG

## 2019-06-15 LAB — FOLATE: Folate: 8 ng/mL (ref >5.9)

## 2019-06-15 LAB — C-REACTIVE PROTEIN: CRP: 0.9 mg/dL (ref <1.0)

## 2019-06-15 LAB — D-DIMER, QUANTITATIVE: D-Dimer, Quant: 0.91 ug/mL-FEU — ABNORMAL HIGH (ref 0.00–0.50)

## 2019-06-15 MED ORDER — DONEPEZIL HCL 10 MG PO TABS
10.0000 mg | ORAL_TABLET | Freq: Every day | ORAL | Status: DC
  Administered 2019-06-15 – 2019-06-22 (×7): 10 mg via ORAL
  Filled 2019-06-15 (×8): qty 1

## 2019-06-15 MED ORDER — LACTATED RINGERS IV SOLN: INTRAVENOUS | Status: AC

## 2019-06-15 MED ORDER — AMLODIPINE BESYLATE 5 MG PO TABS
5.0000 mg | ORAL_TABLET | Freq: Every day | ORAL | Status: DC
  Administered 2019-06-15 – 2019-06-19 (×5): 5 mg via ORAL
  Filled 2019-06-15 (×5): qty 1

## 2019-06-15 MED ORDER — ACETAMINOPHEN 325 MG PO TABS
650.0000 mg | ORAL_TABLET | Freq: Four times a day (QID) | ORAL | Status: DC | PRN
  Administered 2019-06-15 – 2019-06-22 (×3): 650 mg via ORAL
  Filled 2019-06-15 (×3): qty 2

## 2019-06-15 MED ORDER — LEVOTHYROXINE SODIUM 88 MCG PO TABS
88.0000 ug | ORAL_TABLET | Freq: Every day | ORAL | Status: DC
  Administered 2019-06-15 – 2019-06-20 (×6): 88 ug via ORAL
  Filled 2019-06-15 (×6): qty 1

## 2019-06-15 MED ORDER — ACETAMINOPHEN 650 MG RE SUPP: 650.0000 mg | Freq: Four times a day (QID) | RECTAL | Status: DC | PRN

## 2019-06-15 MED ORDER — DEXAMETHASONE SODIUM PHOSPHATE 10 MG/ML IJ SOLN
6.0000 mg | Freq: Every day | INTRAMUSCULAR | Status: DC
  Administered 2019-06-15 – 2019-06-23 (×9): 6 mg via INTRAVENOUS
  Filled 2019-06-15 (×10): qty 1

## 2019-06-15 MED ORDER — VENLAFAXINE HCL ER 75 MG PO CP24
75.0000 mg | ORAL_CAPSULE | Freq: Every day | ORAL | Status: DC
  Administered 2019-06-15 – 2019-06-24 (×10): 75 mg via ORAL
  Filled 2019-06-15 (×10): qty 1

## 2019-06-15 MED ORDER — SODIUM CHLORIDE 0.9 % IV SOLN
1.0000 g | INTRAVENOUS | Status: DC
  Administered 2019-06-15 – 2019-06-16 (×2): 1 g via INTRAVENOUS
  Filled 2019-06-15 (×2): qty 10

## 2019-06-15 MED ORDER — SODIUM CHLORIDE 0.9 % IV SOLN
100.0000 mg | Freq: Once | INTRAVENOUS | Status: AC
  Administered 2019-06-15: 100 mg via INTRAVENOUS
  Filled 2019-06-15: qty 20

## 2019-06-15 MED ORDER — MEMANTINE HCL ER 28 MG PO CP24
28.0000 mg | ORAL_CAPSULE | Freq: Every day | ORAL | Status: DC
  Administered 2019-06-15 – 2019-06-23 (×8): 28 mg via ORAL
  Filled 2019-06-15 (×10): qty 1

## 2019-06-15 MED ORDER — POLYETHYLENE GLYCOL 3350 17 G PO PACK: 17.0000 g | PACK | Freq: Every day | ORAL | Status: DC | PRN

## 2019-06-15 MED ORDER — ONDANSETRON HCL 4 MG/2ML IJ SOLN: 4.0000 mg | Freq: Four times a day (QID) | INTRAMUSCULAR | Status: DC | PRN

## 2019-06-15 MED ORDER — MEMANTINE HCL-DONEPEZIL HCL ER 28-10 MG PO CP24: 1.0000 | ORAL_CAPSULE | Freq: Every day | ORAL | Status: DC

## 2019-06-15 MED ORDER — SODIUM CHLORIDE 0.9 % IV SOLN
100.0000 mg | Freq: Every day | INTRAVENOUS | Status: AC
  Administered 2019-06-16 – 2019-06-19 (×4): 100 mg via INTRAVENOUS
  Filled 2019-06-15 (×4): qty 20

## 2019-06-15 MED ORDER — ONDANSETRON HCL 4 MG PO TABS: 4.0000 mg | ORAL_TABLET | Freq: Four times a day (QID) | ORAL | Status: DC | PRN

## 2019-06-15 MED ORDER — BUPROPION HCL ER (XL) 150 MG PO TB24
150.0000 mg | ORAL_TABLET | Freq: Every day | ORAL | Status: DC
  Administered 2019-06-15 – 2019-06-23 (×8): 150 mg via ORAL
  Filled 2019-06-15 (×8): qty 1

## 2019-06-15 MED ORDER — ENOXAPARIN SODIUM 40 MG/0.4ML ~~LOC~~ SOLN
40.0000 mg | SUBCUTANEOUS | Status: DC
  Administered 2019-06-15 – 2019-06-23 (×9): 40 mg via SUBCUTANEOUS
  Filled 2019-06-15 (×10): qty 0.4

## 2019-06-15 NOTE — Progress Notes (Signed)
PROGRESS NOTE    [name]  [id number] DOB: February 17, 1932 DOA: 06/14/2019 PCP: [name], NP   Brief Narrative: 86 year old female with significant medical history of Alzheimer's dementia GERD, depression, hypertension, hyperlipidemia, vitamin D deficiency presented to the ED with her daughter due to concern of rapid onset of weakness and difficulty with ambulation. Patient is a poor historian, history was obtained from patient's family via phone conversation on admission. As per admitting- daughter explains that as recently as this past Sunday the patient was walking down the street using her walker which is her baseline level of activity.  Daughter explains that by Monday morning shortly after breakfast the patient suddenly began to exhibit significant difficulty with ambulation, requiring a two-person assist.  The daughter denies that the patient has developed focal weakness of one side of the body.  The daughter also does not endorse slurring of words, changes in vision or difficulty with tolerating oral intake.  The daughter does state that the patient has had a dry nonproductive cough for approximately 3 days but denies any recent episodes of fever, nausea, vomiting, complaints of dysuria, pain or diarrhea. There have been no recent sick contacts.  Of note, patient has received 1 dose of her COVID-19 vaccine with the second dose due on May 12.  The daughter does not recall which brand of vaccination she is receiving.  Patient was evaluated at her primary care provider's office by Erie Noe NP earlier in the day on 4/27 which the patient was advised to after go to [facility] emergency department for further evaluation In the ED CT head no acute finding, suspected to have UTI and further work-up found out she was positive for Covid. Patient's daughter interested in MRI and was ordered.  Subjective: Seen this morning in the ED. Patient is alert awake denies any  complaint she is not having any pain, on room air. Has dementia, does not follow instruction or commands. PT has worked with her.  Assessment & Plan:  Rapidly worsening weakness/motor dysfunction, difficult ambulation:Suspecting multifactorial suspected UTI and also with COVID-19 infection. CT head no acute finding, checking B12, folate, TSH. There was question of possible left-sided weakness on admission/pcp, and MRI brain was ordered "Motion degraded study Moderate to advanced atrophy with extensive chronic microvascular ischemia No acute infarct 1 cm calcific meningioma in the left posterior fossa appears unchanged from 2013".  Continue on antibiotics, placed on Decadron/remdesivir for Covid infection and also continue PT OT.  Complicated UTI:UA Q000111Q, many bacteria, leukocytes large.Continue ceftriaxone follow-up on culture data.  COVID-19 virus infection: X-ray is unremarkable and she is not hypoxic.Inflammatory markers are relatively stable. Patient has a significant weakness which could be contributed by her COVID-19 infection, I think she will benefit with remdesivir infusion x5 days.Will start her Decadron also, her spo2 has been 91-93 at times. Recent Labs    06/15/19 0955  DDIMER 0.91*  FERRITIN 57  LDH 219*  CRP 0.9   Lab Results  Component Value Date   SARSCOV2NAA POSITIVE (A) 06/14/2019   [place] NEGATIVE 08/25/2018   Hypokalemia: Potassium 2.9 on admission. Potassium was replaced on admission  and repeat BMP is pending.  Hypothyroidism: Continue home Synthroid. TSH normal in the past years, check TSH in the morning  Essential hypertension: Pressure is well controlled, continue home amlodipine.  Alzheimer's dementia with behavioral disturbance:continue Namenda, Aricept and venlafaxine and bupropion. Continue supportive care patient has been pulling out Line and has been placed on mitten, she normally  has sitter at home.  Continue to monitor closely  DVT  prophylaxis:lovenox. Code Status: DNR Family Communication: plan of care discussed with patient's daughter over the phone. 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  The treatment plan and use of medications and known side effects were discussed with patient/family, they were clearly explained that there is no proven definitive treatment for COVID-19 infection, any medications used here are based on published clinical articles/anecdotal data which are not peer-reviewed or randomized control trials.  Complete risks and long-term side effects are unknown, however in the best clinical judgment they seem to be of some clinical benefit rather than medical risks.  Patient/family agree with the treatment plan and want to receive the given medications.  Status is: Inpatient The patient will require care spanning > 2 midnights and should be moved to inpatient because:Inpatient level of care appropriate due to severity of illness, for management of UTI, covid virus infection.  Dispo: The patient is from: Home             Anticipated d/c is to: home vs SNF             Anticipated d/c date is: > 3 days             Patient currently is not medically stable to d/c.  Diet Order            Diet Heart Room service appropriate? Yes; Fluid consistency: Thin  Diet effective now                There is no height or weight on file to calculate BMI. Pressure Ulcer:    Consultants:see note  Procedures:see note Microbiology:see note  Medications: Scheduled Meds: . amLODipine  5 mg Oral Daily  . buPROPion  150 mg Oral QHS  . memantine  28 mg Oral QHS   And  . donepezil  10 mg Oral QHS  . enoxaparin (LOVENOX) injection  40 mg Subcutaneous Q24H  . levothyroxine  88 mcg Oral Q0600  . venlafaxine XR  75 mg Oral Q breakfast   Continuous Infusions: . cefTRIAXone (ROCEPHIN)  IV    . lactated ringers 75 mL/hr at 06/15/19 1054    Antimicrobials: Anti-infectives (From  admission, onward)   Start     Dose/Rate Route Frequency Ordered Stop   06/15/19 2300  cefTRIAXone (ROCEPHIN) 1 g in sodium chloride 0.9 % 100 mL IVPB     1 g 200 mL/hr over 30 Minutes Intravenous Every 24 hours 06/15/19 0248     06/14/19 2330  fosfomycin (MONUROL) packet 3 g  Status:  Discontinued     3 g Oral  Once 06/14/19 2247 06/14/19 2310   06/14/19 2315  cefTRIAXone (ROCEPHIN) 1 g in sodium chloride 0.9 % 100 mL IVPB     1 g 200 mL/hr over 30 Minutes Intravenous  Once 06/14/19 2310 06/15/19 0101     Objective: Vitals: Today's Vitals   06/15/19 1245 06/15/19 1300 06/15/19 1315 06/15/19 1330  BP: (!) 144/85 (!) 150/78 137/79 139/83  Pulse: 78 73 73 76  Resp: 18 15 19 16   Temp:      TempSrc:      SpO2: 91% 91% 93% 91%    Intake/Output Summary (Last 24 hours) at 06/15/2019 1353 Last data filed at 06/15/2019 0257 Gross per 24 hour  Intake 2108.02 ml  Output --  Net 2108.02 ml   There were no vitals filed for this visit.  Weight change:    Intake/Output from previous day: 04/27 0701 - 04/28 0700 In: 2108 [I.V.:1008; IV Piggyback:1100] Out: -  Intake/Output this shift: No intake/output data recorded.  Examination:  General exam: Patient alert denies any complaint, demented, does not follow commands. On room air.  HEENT:Oral mucosa moist, Ear/Nose WNL grossly,dentition normal. Respiratory system: bilaterally clear, no wheezing or crackles,no use of accessory muscle, non tender. Cardiovascular system: S1 & S2 +,regular, No JVD. Gastrointestinal system: Abdomen soft, NT,ND, BS+. Nervous System:Alert, awake, moving extremities and grossly nonfocal Extremities: No edema, distal peripheral pulses palpable.  Skin: No rashes,no icterus. MSK: Normal muscle bulk,tone, power  Data Reviewed: I have personally reviewed following labs and imaging studies CBC: Recent Labs  Lab 06/14/19 1831  WBC 6.2  NEUTROABS 3.7  HGB 13.5  HCT 41.3  MCV 104.0*  PLT Q000111Q   Basic  Metabolic Panel: Recent Labs  Lab 06/14/19 1831  NA 140  K 2.9*  CL 106  CO2 24  GLUCOSE 105*  BUN 20  CREATININE 1.07*  CALCIUM 8.4*   GFR: CrCl cannot be calculated (Unknown ideal weight.). Liver Function Tests: Recent Labs  Lab 06/14/19 1831  AST 20  ALT 17  ALKPHOS 106  BILITOT 0.4  PROT 7.2  ALBUMIN 3.8   No results for input(s): LIPASE, AMYLASE in the last 168 hours. No results for input(s): AMMONIA in the last 168 hours. Coagulation Profile: No results for input(s): INR, PROTIME in the last 168 hours. Cardiac Enzymes: No results for input(s): CKTOTAL, CKMB, CKMBINDEX, TROPONINI in the last 168 hours. BNP (last 3 results) No results for input(s): PROBNP in the last 8760 hours. HbA1C: No results for input(s): HGBA1C in the last 72 hours. CBG: No results for input(s): GLUCAP in the last 168 hours. Lipid Profile: Recent Labs    06/15/19 0405  CHOL 191  HDL 63  LDLCALC 115*  TRIG 64  CHOLHDL 3.0   Thyroid Function Tests: No results for input(s): TSH, T4TOTAL, FREET4, T3FREE, THYROIDAB in the last 72 hours. Anemia Panel: Recent Labs    06/15/19 0405 06/15/19 0955  VITAMINB12 1,368*  --   FOLATE 8.0  --   FERRITIN  --  57   Sepsis Labs: Recent Labs  Lab 06/15/19 0225 06/15/19 0412  LATICACIDVEN 0.8 1.6    Recent Results (from the past 240 hour(s))  Respiratory Panel by RT PCR (Flu A&B, Covid) - Nasopharyngeal Swab     Status: Abnormal   Collection Time: 06/14/19 11:11 PM   Specimen: Nasopharyngeal Swab  Result Value Ref Range Status   SARS Coronavirus 2 by RT PCR POSITIVE (A) NEGATIVE Final    Comment: RESULT CALLED TO, READ BACK BY AND VERIFIED WITH: BULLOCK,A @ [id number] ON [id number] BY POTEAT,S (NOTE) SARS-CoV-2 target nucleic acids are DETECTED. SARS-CoV-2 RNA is generally detectable in upper respiratory specimens  during the acute phase of infection. Positive results are indicative of the presence of the identified virus, but do not rule  out bacterial infection or co-infection with other pathogens not detected by the test. Clinical correlation with patient history and other diagnostic information is necessary to determine patient infection status. The expected result is Negative. Fact Sheet for Patients:  PinkCheek.be Fact Sheet for Healthcare Providers: GravelBags.it This test is not yet approved or cleared by the [place] FDA and  has been authorized for detection and/or diagnosis of SARS-CoV-2 by FDA under an Emergency Use Authorization (EUA).  This EUA will remain in effect (meaning this test can be  used ) for the duration of  the COVID-19 declaration under Section 564(b)(1) of the Act, 21 U.S.C. section 360bbb-3(b)(1), unless the authorization is terminated or revoked sooner.    Influenza A by PCR NEGATIVE NEGATIVE Final   Influenza B by PCR NEGATIVE NEGATIVE Final    Comment: (NOTE) The Xpert Xpress SARS-CoV-2/FLU/RSV assay is intended as an aid in  the diagnosis of influenza from Nasopharyngeal swab specimens and  should not be used as a sole basis for treatment. Nasal washings and  aspirates are unacceptable for Xpert Xpress SARS-CoV-2/FLU/RSV  testing. Fact Sheet for Patients: PinkCheek.be Fact Sheet for Healthcare Providers: GravelBags.it This test is not yet approved or cleared by the [place] FDA and  has been authorized for detection and/or diagnosis of SARS-CoV-2 by  FDA under an Emergency Use Authorization (EUA). This EUA will remain  in effect (meaning this test can be used) for the duration of the  Covid-19 declaration under Section 564(b)(1) of the Act, 21  U.S.C. section 360bbb-3(b)(1), unless the authorization is  terminated or revoked. Performed at [facility], [place] [address], [place], [place] [address]   Culture, blood (routine x 2)     Status:  None (Preliminary result)   Collection Time: 06/15/19  2:25 AM   Specimen: BLOOD  Result Value Ref Range Status   Specimen Description   Final    BLOOD LEFT ANTECUBITAL Performed at [facility] [address] [place], [place] [address]    Special Requests   Final    BOTTLES DRAWN AEROBIC AND ANAEROBIC Blood Culture adequate volume Performed at [facility] [address], [place], [place] [address]    Culture   Final    NO GROWTH < 12 HOURS Performed at [facility] [address] [place], [place] [address]    Report Status PENDING  Incomplete  Culture, blood (routine x 2)     Status: None (Preliminary result)   Collection Time: 06/15/19  2:30 AM   Specimen: BLOOD  Result Value Ref Range Status   Specimen Description   Final    BLOOD RIGHT ANTECUBITAL Performed at [facility] [address] [place], [place] [address]    Special Requests   Final    BOTTLES DRAWN AEROBIC AND ANAEROBIC Blood Culture results may not be optimal due to an inadequate volume of blood received in culture bottles Performed at [facility] [address] [place], [place] [address]    Culture   Final    NO GROWTH < 12 HOURS Performed at [facility] [address] [place], [place] [address]    Report Status PENDING  Incomplete      Radiology Studies: CT Head Wo Contrast  Result Date: 06/14/2019 CLINICAL DATA:  Encephalopathy. Generalized weakness. EXAM: CT HEAD WITHOUT CONTRAST TECHNIQUE: Contiguous axial images were obtained from the base of the skull through the vertex without intravenous contrast. COMPARISON:  11/16/2018 FINDINGS: Brain: No intracranial hemorrhage, mass effect, or midline shift. Generalized atrophy. No hydrocephalus. The basilar cisterns are patent. Moderate to advanced chronic small vessel ischemia. No evidence of territorial infarct or acute ischemia. Stable calcified left posterior  fossa wall 10 x 7 mm meningioma. No extra-axial or intracranial fluid collection. Vascular: Atherosclerosis of skullbase vasculature without hyperdense vessel or abnormal calcification. Skull: No fracture or focal lesion. Sinuses/Orbits: Paranasal sinuses and mastoid air cells are clear. The visualized orbits are unremarkable. Bilateral cataract resection. Other: None. IMPRESSION:  1. No acute intracranial abnormality. 2. Stable atrophy and chronic small vessel ischemia. 3. Stable small left posterior fossa meningioma. Electronically Signed   By: Keith Rake M.D.   On: 06/14/2019 19:09   MR BRAIN WO CONTRAST  Result Date: 06/15/2019 CLINICAL DATA:  Focal neuro deficit. Rule out stroke. Alzheimer's, COVID positive history. EXAM: MRI HEAD WITHOUT CONTRAST TECHNIQUE: Multiplanar, multiecho pulse sequences of the brain and surrounding structures were obtained without intravenous contrast. COMPARISON:  CT head 06/14/2019 FINDINGS: Brain: Motion degraded study. Moderate to advanced atrophy.  Negative for hydrocephalus. Negative for acute infarct. Moderate to advanced chronic microvascular ischemic changes in the white matter bilaterally. Negative for hemorrhage. 10 mm calcific mass posterior to the left cerebellum compatible with meningioma. No cerebral edema is present. No change from 2013 MRI. Vascular: Normal arterial flow voids. Skull and upper cervical spine: No focal skeletal lesion. Sinuses/Orbits: Paranasal sinuses clear. Bilateral cataract extraction Other: None IMPRESSION: Motion degraded study Moderate to advanced atrophy with extensive chronic microvascular ischemia No acute infarct 1 cm calcific meningioma in the left posterior fossa appears unchanged from 2013. Electronically Signed   By: Franchot Gallo M.D.   On: 06/15/2019 11:23   DG Chest Port 1 View  Result Date: 06/14/2019 CLINICAL DATA:  Weakness and falling EXAM: PORTABLE CHEST 1 VIEW COMPARISON:  11/16/2018 FINDINGS: Artifact overlies the  chest. Heart size is normal. Chronic aortic atherosclerosis. Lungs are clear allowing for the AP semi-erect positioning. No evidence of heart failure or effusion. Chronic spinal curvature. IMPRESSION: No active disease.  Aortic atherosclerosis. Electronically Signed   By: Nelson Chimes M.D.   On: 06/14/2019 19:15     LOS: 0 days   Time spent: More than 50% of that time was spent in counseling and/or coordination of care.  Antonieta Pert, MD [facility]  06/15/2019, 1:53 PM

## 2019-06-15 NOTE — Progress Notes (Signed)
PT able to perform therapy with Flutter device- non productive, dry cough at this time. Most recent CXR (06/14/19) clear.

## 2019-06-15 NOTE — ED Notes (Signed)
Pt transported to MRI 

## 2019-06-15 NOTE — ED Notes (Signed)
Attempted EKG. Pt will not tolerate.

## 2019-06-15 NOTE — ED Notes (Signed)
Pt has dry brief on. Pt repositioned and given warm blanket. NAD.

## 2019-06-15 NOTE — Evaluation (Signed)
Physical Therapy Evaluation Patient Details Name: [name] MRN: [id number] DOB: Jun 26, 1931 Today's Date: 06/15/2019   History of Present Illness  86 year old female with past medical history of Alzheimer's dementia, gastroesophageal reflux disease, depression, hypertension, hyperlipidemia and vitamin D deficiency who presents to [facility] long hospital with her daughter due to concerns for rapid onset of weakness and difficulty with ambulation. CT head  no acute findings, MRI pending. Suspected UTI  Clinical Impression  The patient is pleasant and confused. Patient assisted to sitting on bed edge, attempted standing x 2 but unsuccessful( requires 2 assistants which were not available). Per  Chart, patient had been ambulatory until decline.  patient did feed herself a few bites of  breakfast, strangled x 1 on water but not again once sitting more upright.  Patient performed flutter valve x 5 with verbal cues.   Pt currently with functional limitations due to the deficits listed below (see PT Problem List). Pt will benefit from skilled PT to increase their independence and safety with mobility to allow discharge to the venue listed below.  Pt admitted with above diagnosis.  Pt currently with functional limitations due to the deficits listed below (see PT Problem List). Pt will benefit from skilled PT to increase their independence and safety with mobility to allow discharge to the venue listed below.       Follow Up Recommendations SNF;Supervision/Assistance - 24 hour    Equipment Recommendations  None recommended by PT    Recommendations for Other Services       Precautions / Restrictions Precautions Precaution Comments: + covid      Mobility  Bed Mobility Overal bed mobility: Needs Assistance Bed Mobility: Supine to Sit;Sit to Supine     Supine to sit: HOB elevated;Max assist Sit to supine: HOB elevated;Max assist   General bed mobility comments: assisted with legs and trunk to  sit up and to return to supine/HOB raised  Transfers Overall transfer level: Needs assistance   Transfers: Sit to/from Stand Sit to Stand: Max assist         General transfer comment: attempted  x2 to stand on foot stool  with a rail and holding to back of chair as stretcher was tall and the feet did not reach and only 1 person availble to assist. Patient partially cleared buttocks.  Ambulation/Gait                Stairs            Wheelchair Mobility    Modified Rankin (Stroke Patients Only)       Balance Overall balance assessment: Needs assistance Sitting-balance support: Bilateral upper extremity supported;Feet supported Sitting balance-Leahy Scale: Fair Sitting balance - Comments: sits at midline     Standing balance-Leahy Scale: Zero                               Pertinent Vitals/Pain      Home Living Family/patient expects to be discharged to:: Private residence Living Arrangements: Children Available Help at Discharge: Family Type of Home: House Home Access: Stairs to enter Entrance Stairs-Rails: Can reach both Entrance Stairs-Number of Steps: 3 Home Layout: One level Home Equipment: Walker - 2 wheels Additional Comments: information taken from previous admit - pt is not reliable historian    Prior Function           Comments: recently unable to ambulate per admission history     Hand  Dominance   Dominant Hand: Right    Extremity/Trunk Assessment   Upper Extremity Assessment Upper Extremity Assessment: Generalized weakness(abl;e to feed self)    Lower Extremity Assessment Lower Extremity Assessment: Generalized weakness    Cervical / Trunk Assessment Cervical / Trunk Assessment: Kyphotic  Communication   Communication: HOH  Cognition Arousal/Alertness: Awake/alert Behavior During Therapy: Restless Overall Cognitive Status: No family/caregiver present to determine baseline cognitive functioning                                  General Comments: patient talking about her sister coming, that she wants to get up and walk to the hall and sit down. Does follow simple directions. Fed herself a little breakfast.      General Comments      Exercises     Assessment/Plan    PT Assessment Patient needs continued PT services  PT Problem List Decreased strength;Decreased cognition;Decreased knowledge of precautions;Decreased balance;Decreased mobility;Decreased knowledge of use of DME;Decreased activity tolerance;Decreased safety awareness       PT Treatment Interventions DME instruction;Functional mobility training;Patient/family education;Gait training;Therapeutic activities;Therapeutic exercise    PT Goals (Current goals can be found in the Care Plan section)  Acute Rehab PT Goals PT Goal Formulation: Patient unable to participate in goal setting Time For Goal Achievement: 06/29/19 Potential to Achieve Goals: Fair    Frequency Min 2X/week   Barriers to discharge        Co-evaluation               AM-PAC PT "6 Clicks" Mobility  Outcome Measure Help needed turning from your back to your side while in a flat bed without using bedrails?: A Lot Help needed moving from lying on your back to sitting on the side of a flat bed without using bedrails?: A Lot Help needed moving to and from a bed to a chair (including a wheelchair)?: Total Help needed standing up from a chair using your arms (e.g., wheelchair or bedside chair)?: Total Help needed to walk in hospital room?: Total Help needed climbing 3-5 steps with a railing? : Total 6 Click Score: 8    End of Session Equipment Utilized During Treatment: Gait belt Activity Tolerance: Patient tolerated treatment well Patient left: in bed;with call bell/phone within reach Nurse Communication: Mobility status PT Visit Diagnosis: Difficulty in walking, not elsewhere classified (R26.2)    Time: 0825-0901 PT Time Calculation  (min) (ACUTE ONLY): 36 min   Charges:   PT Evaluation $PT Eval Moderate Complexity: 1 Mod PT Treatments $Therapeutic Activity: 8-22 mins        [place] Pager [phone] Office [phone]   Claretha Cooper 06/15/2019, 10:41 AM

## 2019-06-15 NOTE — ED Notes (Signed)
Pt rings and watch given to daughter [name].

## 2019-06-15 NOTE — ED Notes (Signed)
Patient is asleep and unable to eat at this time

## 2019-06-15 NOTE — H&P (Signed)
History and Physical    [name] [id number] DOB: Jul 11, 1931 DOA: 06/14/2019  PCP: [name], NP  Patient coming from: Home   Chief Complaint:  Chief Complaint  Patient presents with  . Weakness     HPI:  86 year old female with past medical history of Alzheimer's dementia, gastroesophageal reflux disease, depression, hypertension, hyperlipidemia and vitamin D deficiency who presents to [facility] long hospital with her daughter due to concerns for rapid onset of weakness and difficulty with ambulation.  Patient is an extremely poor historian due to known history of advanced Alzheimer's dementia and therefore history is been obtained from the daughter via phone conversation.  Daughter explains that as recently as this past Sunday the patient was walking down the street using her walker which is her baseline level of activity.  Daughter explains that by Monday morning shortly after breakfast the patient suddenly began to exhibit significant difficulty with ambulation, requiring a two-person assist.  The daughter denies that the patient has developed focal weakness of one side of the body.  The daughter also does not endorse slurring of words, changes in vision or difficulty with tolerating oral intake.  The daughter does state that the patient has had a dry nonproductive cough for approximately 3 days but denies any recent episodes of fever, nausea, vomiting, complaints of dysuria, pain or diarrhea.  There have been no recent sick contacts.  Of note, patient has received 1 dose of her COVID-19 vaccine with the second dose due on May 12.  The daughter does not recall which brand of vaccination she is receiving.  Patient was evaluated at her primary care provider's office by Erie Noe NP earlier in the day on 4/27 which the patient was advised to after go to [facility] emergency department for further evaluation.  Upon evaluation in the emergency department, CT  imaging of the head without contrast revealed no evidence of acute disease.  Urinalysis was somewhat suggestive of a urinary tract infection.  Patient was initiated on intravenous fluids as well as intravenous ceftriaxone for suspected urinary tract infection.  The hospitalist group was then called to assess the patient for admission to the hospital.  Review of Systems: Unable to perform due to advanced dementia.   Past Medical History:  Diagnosis Date  . Abnormality of gait   . Alzheimer's disease ([place])   . Calculus of kidney   . Closed fracture of seventh cervical vertebra without mention of spinal cord injury   . Depressive disorder, not elsewhere classified   . Edema   . Edema   . External hemorrhoids without mention of complication   . Gastric ulcer, unspecified as acute or chronic, without mention of hemorrhage, perforation, or obstruction   . GERD (gastroesophageal reflux disease)   . Hypertension   . Insomnia, unspecified   . Loss of weight   . Osteoarthrosis, unspecified whether generalized or localized, unspecified site   . Other protein-calorie malnutrition   . Pain in thoracic spine   . Personal history of fall   . Unspecified constipation   . Unspecified hereditary and idiopathic peripheral neuropathy   . Unspecified hypothyroidism   . Unspecified vitamin D deficiency   . Urinary frequency     Past Surgical History:  Procedure Laterality Date  . TEE WITHOUT CARDIOVERSION N/A 11/13/2016   Procedure: TRANSESOPHAGEAL ECHOCARDIOGRAM (TEE);  Surgeon: Jerline Pain, MD;  Location: [facility] ENDOSCOPY;  Service: Cardiovascular;  Laterality: N/A;  . WRIST SURGERY Right 2013  reports that she has never smoked. She has never used smokeless tobacco. She reports that she does not drink alcohol or use drugs.  No Known Allergies  Family History  Problem Relation Age of Onset  . Cancer Mother   . CVA Father   . Cancer Brother   . Alcohol abuse Sister      Prior to  Admission medications   Medication Sig Start Date End Date Taking? Authorizing Provider  acetaminophen (TYLENOL) 500 MG tablet Take 500 mg by mouth as needed for moderate pain.   Yes [provider]  amLODipine (NORVASC) 5 MG tablet Take 1 tablet (5 mg total) by mouth daily. 06/07/19  Yes [name], NP  buPROPion (WELLBUTRIN XL) 150 MG 24 hr tablet TAKE 1 TABLET EVERY DAY Patient taking differently: Take 150 mg by mouth at bedtime.  02/16/19  Yes [name], NP  levothyroxine (SYNTHROID) 88 MCG tablet TAKE ONE TABLET  ONCE DAILY 30 MINUTES BEFORE BREAKFAST FOR THYROID Patient taking differently: Take 88 mcg by mouth daily before breakfast.  02/16/19  Yes [name], NP  Memantine HCl-Donepezil HCl (NAMZARIC) 28-10 MG CP24 Take 1 capsule by mouth daily. Please call and schedule follow up appointment for more refills Patient taking differently: Take 1 capsule by mouth at bedtime. Please call and schedule follow up appointment for more refills 02/28/19  Yes [name], NP  venlafaxine XR (EFFEXOR-XR) 75 MG 24 hr capsule TAKE 1 CAPSULE EVERY DAY WITH BREAKFAST Must make appointment for more refills Patient taking differently: Take 75 mg by mouth daily with breakfast. Must make appointment for more refills 02/28/19  Yes [name], NP  Vitamin D, Ergocalciferol, (DRISDOL) 1.25 MG (50000 UT) CAPS capsule Take one capsule by mouth every 14 days Patient taking differently: Take 50,000 Units by mouth every 14 (fourteen) days.  10/06/18  Yes [name], NP  acetaminophen (TYLENOL) 325 MG tablet Take 2 tablets (650 mg total) by mouth every 6 (six) hours as needed. Patient not taking: Reported on 06/14/2019 07/24/17   Jacqlyn Larsen, PA-C    Physical Exam: Vitals:   06/14/19 2056 06/14/19 2330 06/15/19 0030 06/15/19 0200  BP: (!) 154/97 (!) 184/99 (!) 153/82 (!) 146/98  Pulse: 90 78 73 77  Resp: (!) 22 15 17    Temp:      TempSrc:      SpO2: 94% 93% 98%  97%    Constitutional: Lethargic but arousable and oriented x1, patient is currently not in any associated distress. Skin: no rashes, no lesions, somewhat poor skin turgor noted. Eyes: Pupils are equally reactive to light.  No evidence of scleral icterus or conjunctival pallor.  ENMT: Slightly dry mucous membranes noted.  Posterior pharynx clear of any exudate or lesions. Normal dentition.   Neck: normal, supple, no masses, no thyromegaly Respiratory: clear to auscultation bilaterally, no wheezing, no crackles. Normal respiratory effort. No accessory muscle use.  Cardiovascular: Regular rate and rhythm, no murmurs / rubs / gallops. No extremity edema. 2+ pedal pulses. No carotid bruits.  Back:   Nontender without crepitus or deformity. Abdomen: Mild lower abdominal tenderness noted.  Abdomen is soft.  No evidence of intra-abdominal masses.  Positive bowel sounds noted in all quadrants.   Musculoskeletal: Poor muscle tone.  No joint deformity upper and lower extremities. Good ROM, no contractures. Normal muscle tone.  Neurologic: No focal cranial nerve deficit noted.  Sensation is noted to be intact.  Patient is moving all 4  extremities spontaneously.  Patient is responsive to verbal stimuli.  Patient is only intermittently following commands.   Psychiatric: Patient presents as a normal mood with appropriate affect.  Patient currently does not seem to possess insight as to her current situation.  Labs on Admission: I have personally reviewed following labs and imaging studies -   CBC: Recent Labs  Lab 06/14/19 1831  WBC 6.2  NEUTROABS 3.7  HGB 13.5  HCT 41.3  MCV 104.0*  PLT [id number]   Basic Metabolic Panel: Recent Labs  Lab 06/14/19 1831  NA 140  K 2.9*  CL 106  CO2 24  GLUCOSE 105*  BUN 20  CREATININE 1.07*  CALCIUM 8.4*   GFR: CrCl cannot be calculated (Unknown ideal weight.). Liver Function Tests: Recent Labs  Lab 06/14/19 1831  AST 20  ALT 17  ALKPHOS 106  BILITOT  0.4  PROT 7.2  ALBUMIN 3.8   No results for input(s): LIPASE, AMYLASE in the last 168 hours. No results for input(s): AMMONIA in the last 168 hours. Coagulation Profile: No results for input(s): INR, PROTIME in the last 168 hours. Cardiac Enzymes: No results for input(s): CKTOTAL, CKMB, CKMBINDEX, TROPONINI in the last 168 hours. BNP (last 3 results) No results for input(s): PROBNP in the last 8760 hours. HbA1C: No results for input(s): HGBA1C in the last 72 hours. CBG: No results for input(s): GLUCAP in the last 168 hours. Lipid Profile: No results for input(s): CHOL, HDL, LDLCALC, TRIG, CHOLHDL, LDLDIRECT in the last 72 hours. Thyroid Function Tests: No results for input(s): TSH, T4TOTAL, FREET4, T3FREE, THYROIDAB in the last 72 hours. Anemia Panel: No results for input(s): VITAMINB12, FOLATE, FERRITIN, TIBC, IRON, RETICCTPCT in the last 72 hours. Urine analysis:    Component Value Date/Time   COLORURINE YELLOW 06/14/2019 2157   APPEARANCEUR TURBID (A) 06/14/2019 2157   LABSPEC 1.014 06/14/2019 2157   PHURINE 8.0 06/14/2019 2157   GLUCOSEU NEGATIVE 06/14/2019 2157   HGBUR NEGATIVE 06/14/2019 2157   BILIRUBINUR NEGATIVE 06/14/2019 2157   BILIRUBINUR neg 05/26/2019 [place] 06/14/2019 2157   PROTEINUR 100 (A) 06/14/2019 2157   UROBILINOGEN 0.2 05/26/2019 1646   UROBILINOGEN 0.2 12/19/2014 2002   NITRITE NEGATIVE 06/14/2019 2157   LEUKOCYTESUR LARGE (A) 06/14/2019 2157    Radiological Exams on Admission personally reviewed: CT Head Wo Contrast  Result Date: 06/14/2019 CLINICAL DATA:  Encephalopathy. Generalized weakness. EXAM: CT HEAD WITHOUT CONTRAST TECHNIQUE: Contiguous axial images were obtained from the base of the skull through the vertex without intravenous contrast. COMPARISON:  11/16/2018 FINDINGS: Brain: No intracranial hemorrhage, mass effect, or midline shift. Generalized atrophy. No hydrocephalus. The basilar cisterns are patent. Moderate to  advanced chronic small vessel ischemia. No evidence of territorial infarct or acute ischemia. Stable calcified left posterior fossa wall 10 x 7 mm meningioma. No extra-axial or intracranial fluid collection. Vascular: Atherosclerosis of skullbase vasculature without hyperdense vessel or abnormal calcification. Skull: No fracture or focal lesion. Sinuses/Orbits: Paranasal sinuses and mastoid air cells are clear. The visualized orbits are unremarkable. Bilateral cataract resection. Other: None. IMPRESSION: 1. No acute intracranial abnormality. 2. Stable atrophy and chronic small vessel ischemia. 3. Stable small left posterior fossa meningioma. Electronically Signed   By: Keith Rake M.D.   On: 06/14/2019 19:09   DG Chest Port 1 View  Result Date: 06/14/2019 CLINICAL DATA:  Weakness and falling EXAM: PORTABLE CHEST 1 VIEW COMPARISON:  11/16/2018 FINDINGS: Artifact overlies the chest. Heart size is normal. Chronic aortic atherosclerosis. Lungs are  clear allowing for the AP semi-erect positioning. No evidence of heart failure or effusion. Chronic spinal curvature. IMPRESSION: No active disease.  Aortic atherosclerosis. Electronically Signed   By: Nelson Chimes M.D.   On: 06/14/2019 19:15     Assessment/Plan Principal Problem:   Complicated UTI (urinary tract infection)   Patient is presenting with 2-day history of rapidly worsening weakness and difficulty with ambulation in the setting of lower abdominal tenderness on examination and a urinalysis suggestive of urinary tract infection.  It is possible that a complicated urinary tract infection as the cause of the patient's weakness and difficulty with ambulation.  Treating patient empirically with intravenous ceftriaxone  Urine and blood cultures have been ordered  Patient is being gently hydrated with intravenous isotonic fluids  Monitoring for symptomatic improvement  Active Problems:   Generalized weakness   Patient is exhibiting a  rather sudden onset of generalized weakness and difficulty with ambulation  Complicated urinary tract infection might be the cause  Distantly obtaining vitamin B12, folate -TSH performed within the past year was unremarkable  PT evaluation ordered for the morning  Outpatient provider note on 4/27 notes that there was some question as to left-sided weakness which I do not note on my examination.  Because of this, as well as the family's inquiry about getting an MRI, I will order an MRI for the morning although I believe that acute stroke is unlikely.  Furthermore, due to patient's advanced dementia it is unlikely the patient will lay still for the test.  I do not believe that sedating the patient for this particular test is appropriate.    Hypothyroidism   Continue home regimen of Synthroid  TSH performed within the last year is at target    Essential hypertension   Continue home regimen of antihypertensive therapy    Alzheimer's dementia with behavioral disturbance (HCC)   Known history of advanced Alzheimer's dementia is complicating our assessment of the patient  Attempting to minimize uncomfortable stimuli as much as possible, encouraging family to remain at bedside as much as possible.     Code Status:  DNR Family Communication: Dust patient's condition and plan of care with daughter over the phone who has power of attorney  Status is: Observation  The patient remains OBS appropriate and will d/c before 2 midnights.  Dispo: The patient is from: Home              Anticipated d/c is to: Home              Anticipated d/c date is: 2 days              Patient currently is not medically stable to d/c.        Vernelle Emerald MD [facility] Pager [phone]  If 7PM-7AM, please contact night-coverage [email or web address] Use universal [place] password for that web site. If you do not have the password, please call the hospital operator.  06/15/2019, 3:00  AM

## 2019-06-15 NOTE — Progress Notes (Signed)
ED TO INPATIENT HANDOFF REPORT  Name/Age/Gender [name] 86 y.o. female  Code Status    Code Status Orders  (From admission, onward)         Start     Ordered   06/15/19 0534  Do not attempt resuscitation (DNR)  Continuous    Question Answer Comment  In the event of cardiac or respiratory ARREST Do not call a "code blue"   In the event of cardiac or respiratory ARREST Do not perform Intubation, CPR, defibrillation or ACLS   In the event of cardiac or respiratory ARREST Use medication by any route, position, wound care, and other measures to relive pain and suffering. May use oxygen, suction and manual treatment of airway obstruction as needed for comfort.      06/15/19 0533        Code Status History    Date Active Date Inactive Code Status Order ID Comments User Context   10/05/2018 [id number] 11/16/2018 1526 DNR [id number]  [name], NP Outpatient   06/30/2017 0829 07/01/2017 1934 DNR [id number]  Radene Gunning, NP ED   06/30/2017 0828 06/30/2017 0829 Full Code [id number]  Radene Gunning, NP ED   11/10/2016 2158 11/14/2016 2251 DNR [id number]  Florencia Reasons, MD Inpatient   11/10/2016 1708 11/10/2016 2158 DNR [id number]  Florencia Reasons, MD ED   11/27/2015 1606 11/10/2016 1119 DNR [id number]  [name], NP Outpatient   Advance Care Planning Activity      Home/SNF/Other Home  Chief Complaint Complicated UTI (urinary tract infection) [N39.0] COVID-19 virus infection [U07.1]  Level of Care/Admitting Diagnosis ED Disposition    ED Disposition Condition [facility] Area: [facility] [[id number]]  Level of Care: Med-Surg [16]  Covid Evaluation: Confirmed COVID Positive  Diagnosis: COVID-19 virus infection [id number]  Admitting Physician: Antonieta Pert [id number]  Attending Physician: Antonieta Pert [id number]  Estimated length of stay: 3 - 4 days  Certification:: I certify this patient will need inpatient services for at least 2 midnights        Medical History Past Medical History:  Diagnosis Date  . Abnormality of gait   . Alzheimer's disease ([employer])   . Calculus of kidney   . Closed fracture of seventh cervical vertebra without mention of spinal cord injury   . Depressive disorder, not elsewhere classified   . Edema   . Edema   . External hemorrhoids without mention of complication   . Gastric ulcer, unspecified as acute or chronic, without mention of hemorrhage, perforation, or obstruction   . GERD (gastroesophageal reflux disease)   . Hypertension   . Insomnia, unspecified   . Loss of weight   . Osteoarthrosis, unspecified whether generalized or localized, unspecified site   . Other protein-calorie malnutrition   . Pain in thoracic spine   . Personal history of fall   . Unspecified constipation   . Unspecified hereditary and idiopathic peripheral neuropathy   . Unspecified hypothyroidism   . Unspecified vitamin D deficiency   . Urinary frequency     Allergies No Known Allergies  IV Location/Drains/Wounds Patient Lines/Drains/Airways Status   Active Line/Drains/Airways    Name:   Placement date:   Placement time:   Site:   Days:   Peripheral IV 06/15/19 Left Forearm   06/15/19    0956    Forearm   less than 1          Labs/Imaging Results for orders placed or performed during the  hospital encounter of 06/14/19 (from the past 48 hour(s))  Comprehensive metabolic panel     Status: Abnormal   Collection Time: 06/14/19  6:31 PM  Result Value Ref Range   Sodium 140 135 - 145 mmol/L   Potassium 2.9 (L) 3.5 - 5.1 mmol/L   Chloride 106 98 - 111 mmol/L   CO2 24 22 - 32 mmol/L   Glucose, Bld 105 (H) 70 - 99 mg/dL    Comment: Glucose reference range applies only to samples taken after fasting for at least 8 hours.   BUN 20 8 - 23 mg/dL   Creatinine, Ser 1.07 (H) 0.44 - 1.00 mg/dL   Calcium 8.4 (L) 8.9 - 10.3 mg/dL   Total Protein 7.2 6.5 - 8.1 g/dL   Albumin 3.8 3.5 - 5.0 g/dL   AST 20 15 - 41 U/L   ALT  17 0 - 44 U/L   Alkaline Phosphatase 106 38 - 126 U/L   Total Bilirubin 0.4 0.3 - 1.2 mg/dL   GFR calc non Af Amer 47 (L) >60 mL/min   GFR calc Af Amer 54 (L) >60 mL/min   Anion gap 10 5 - 15    Comment: Performed at [facility], [place] [address] [place], [place] [address]  CBC with Differential     Status: Abnormal   Collection Time: 06/14/19  6:31 PM  Result Value Ref Range   WBC 6.2 4.0 - 10.5 K/uL   RBC 3.97 3.87 - 5.11 MIL/uL   Hemoglobin 13.5 12.0 - 15.0 g/dL   HCT 41.3 36.0 - 46.0 %   MCV 104.0 (H) 80.0 - 100.0 fL   MCH 34.0 26.0 - 34.0 pg   MCHC 32.7 30.0 - 36.0 g/dL   RDW 13.3 11.5 - 15.5 %   Platelets 194 150 - 400 K/uL   nRBC 0.0 0.0 - 0.2 %   Neutrophils Relative % 59 %   Neutro Abs 3.7 1.7 - 7.7 K/uL   Lymphocytes Relative 21 %   Lymphs Abs 1.3 0.7 - 4.0 K/uL   Monocytes Relative 18 %   Monocytes Absolute 1.1 (H) 0.1 - 1.0 K/uL   Eosinophils Relative 0 %   Eosinophils Absolute 0.0 0.0 - 0.5 K/uL   Basophils Relative 1 %   Basophils Absolute 0.0 0.0 - 0.1 K/uL   Immature Granulocytes 1 %   Abs Immature Granulocytes 0.05 0.00 - 0.07 K/uL    Comment: Performed at [facility], [place] [address] [place], [place] [address]  Urinalysis, Routine w reflex microscopic     Status: Abnormal   Collection Time: 06/14/19  9:57 PM  Result Value Ref Range   Color, Urine YELLOW YELLOW   APPearance TURBID (A) CLEAR   Specific Gravity, Urine 1.014 1.005 - 1.030   pH 8.0 5.0 - 8.0   Glucose, UA NEGATIVE NEGATIVE mg/dL   Hgb urine dipstick NEGATIVE NEGATIVE   Bilirubin Urine NEGATIVE NEGATIVE   Ketones, ur NEGATIVE NEGATIVE mg/dL   Protein, ur 100 (A) NEGATIVE mg/dL   Nitrite NEGATIVE NEGATIVE   Leukocytes,Ua LARGE (A) NEGATIVE   RBC / HPF 21-50 0 - 5 RBC/hpf   WBC, UA >50 (H) 0 - 5 WBC/hpf   Bacteria, UA MANY (A) NONE SEEN   Squamous Epithelial / LPF 0-5 0 - 5   WBC Clumps PRESENT     Comment: Performed at [facility], [place] [address] [place], [place] [address]  Respiratory Panel by RT PCR (  Flu A&B, Covid) - Nasopharyngeal Swab     Status: Abnormal   Collection Time: 06/14/19 11:11 PM   Specimen: Nasopharyngeal Swab  Result Value Ref Range   SARS Coronavirus 2 by RT PCR POSITIVE (A) NEGATIVE    Comment: RESULT CALLED TO, READ BACK BY AND VERIFIED WITH: BULLOCK,A @ [id number] ON [id number] BY POTEAT,S (NOTE) SARS-CoV-2 target nucleic acids are DETECTED. SARS-CoV-2 RNA is generally detectable in upper respiratory specimens  during the acute phase of infection. Positive results are indicative of the presence of the identified virus, but do not rule out bacterial infection or co-infection with other pathogens not detected by the test. Clinical correlation with patient history and other diagnostic information is necessary to determine patient infection status. The expected result is Negative. Fact Sheet for Patients:  PinkCheek.be Fact Sheet for Healthcare Providers: GravelBags.it This test is not yet approved or cleared by the [place] FDA and  has been authorized for detection and/or diagnosis of SARS-CoV-2 by FDA under an Emergency Use Authorization (EUA).  This EUA will remain in effect (meaning this test can be used ) for the duration of  the COVID-19 declaration under Section 564(b)(1) of the Act, 21 U.S.C. section 360bbb-3(b)(1), unless the authorization is terminated or revoked sooner.    Influenza A by PCR NEGATIVE NEGATIVE   Influenza B by PCR NEGATIVE NEGATIVE    Comment: (NOTE) The Xpert Xpress SARS-CoV-2/FLU/RSV assay is intended as an aid in  the diagnosis of influenza from Nasopharyngeal swab specimens and  should not be used as a sole basis for treatment. Nasal washings and  aspirates are unacceptable for Xpert Xpress SARS-CoV-2/FLU/RSV  testing. Fact Sheet for Patients: PinkCheek.be Fact  Sheet for Healthcare Providers: GravelBags.it This test is not yet approved or cleared by the [place] FDA and  has been authorized for detection and/or diagnosis of SARS-CoV-2 by  FDA under an Emergency Use Authorization (EUA). This EUA will remain  in effect (meaning this test can be used) for the duration of the  Covid-19 declaration under Section 564(b)(1) of the Act, 21  U.S.C. section 360bbb-3(b)(1), unless the authorization is  terminated or revoked. Performed at [facility], [place] [address], [place], [place] [address]   Culture, blood (routine x 2)     Status: None (Preliminary result)   Collection Time: 06/15/19  2:25 AM   Specimen: BLOOD  Result Value Ref Range   Specimen Description      BLOOD LEFT ANTECUBITAL Performed at [facility], [place] [address] [place], [place] [address]    Special Requests      BOTTLES DRAWN AEROBIC AND ANAEROBIC Blood Culture adequate volume Performed at [place] [address] [place], [place] [address]    Culture      NO GROWTH < 12 HOURS Performed at [place] [address], [place], [place] [address]    Report Status PENDING   Lactic acid, plasma     Status: None   Collection Time: 06/15/19  2:25 AM  Result Value Ref Range   Lactic Acid, Venous 0.8 0.5 - 1.9 mmol/L    Comment: Performed at [facility], [place] [address], [place], [place] [address]  Culture, blood (routine x 2)     Status: None (Preliminary result)   Collection Time: 06/15/19  2:30 AM   Specimen: BLOOD  Result Value Ref Range   Specimen Description      BLOOD RIGHT ANTECUBITAL Performed at [facility]  [place] [address], [place], [place] [address]    Special Requests      BOTTLES DRAWN AEROBIC AND ANAEROBIC Blood Culture results may not be optimal due to an inadequate volume of blood received in culture bottles Performed at  [facility], [place] [address], [place], [place] [address]    Culture      NO GROWTH < 12 HOURS Performed at [place] [address], [place], [place] [address]    Report Status PENDING   Vitamin B12     Status: Abnormal   Collection Time: 06/15/19  4:05 AM  Result Value Ref Range   Vitamin B-12 1,368 (H) 180 - 914 pg/mL    Comment: (NOTE) This assay is not validated for testing neonatal or myeloproliferative syndrome specimens for Vitamin B12 levels. Performed at [facility], [place] [address], [place], [place] [address]   Folate     Status: None   Collection Time: 06/15/19  4:05 AM  Result Value Ref Range   Folate 8.0 >5.9 ng/mL    Comment: Performed at [facility], [place] [address], [place], [place] [address]  Lipid panel     Status: Abnormal   Collection Time: 06/15/19  4:05 AM  Result Value Ref Range   Cholesterol 191 0 - 200 mg/dL   Triglycerides 64 <150 mg/dL   HDL 63 >40 mg/dL   Total CHOL/HDL Ratio 3.0 RATIO   VLDL 13 0 - 40 mg/dL   LDL Cholesterol 115 (H) 0 - 99 mg/dL    Comment:        Total Cholesterol/HDL:CHD Risk Coronary Heart Disease Risk Table                     Men   Women  1/2 Average Risk   3.4   3.3  Average Risk       5.0   4.4  2 X Average Risk   9.6   7.1  3 X Average Risk  23.4   11.0        Use the calculated Patient Ratio above and the CHD Risk Table to determine the patient's CHD Risk.        ATP III CLASSIFICATION (LDL):  <100     mg/dL   Optimal  100-129  mg/dL   Near or Above                    Optimal  130-159  mg/dL   Borderline  160-189  mg/dL   High  >190     mg/dL   Very High Performed at [place] [address] [place], [place] [address]   Lactic acid, plasma     Status: None   Collection Time: 06/15/19  4:12 AM  Result Value Ref Range   Lactic Acid, Venous 1.6 0.5 - 1.9 mmol/L    Comment: Performed at [facility],  [place] [address], [place], [place] [address]  ABO/Rh     Status: None   Collection Time: 06/15/19  9:55 AM  Result Value Ref Range   ABO/RH(D)      Jenetta Downer NEG Performed at [place] [address] [place], [place] [address]   D-dimer, quantitative (not at [facility])     Status: Abnormal   Collection Time: 06/15/19  9:55 AM  Result Value Ref Range   D-Dimer, Quant 0.91 (H) 0.00 - 0.50 ug/mL-FEU  Comment: (NOTE) At the manufacturer cut-off of 0.50 ug/mL FEU, this assay has been documented to exclude PE with a sensitivity and negative predictive value of 97 to 99%.  At this time, this assay has not been approved by the FDA to exclude DVT/VTE. Results should be correlated with clinical presentation. Performed at [facility], [place] [address] [place], [place] [address]   C-reactive protein     Status: None   Collection Time: 06/15/19  9:55 AM  Result Value Ref Range   CRP 0.9 <1.0 mg/dL    Comment: Performed at [facility], [place] [address], [place], [place] [address]  Lactate dehydrogenase     Status: Abnormal   Collection Time: 06/15/19  9:55 AM  Result Value Ref Range   LDH 219 (H) 98 - 192 U/L    Comment: Performed at [facility], [place] [address] [place], [place] [address]  Ferritin     Status: None   Collection Time: 06/15/19  9:55 AM  Result Value Ref Range   Ferritin 57 11 - 307 ng/mL    Comment: Performed at [facility], [place] [address], [place], [place] [id number]  Basic metabolic panel     Status: Abnormal   Collection Time: 06/15/19  2:52 PM  Result Value Ref Range   Sodium 138 135 - 145 mmol/L   Potassium 3.2 (L) 3.5 - 5.1 mmol/L   Chloride 104 98 - 111 mmol/L   CO2 25 22 - 32 mmol/L   Glucose, Bld 95 70 - 99 mg/dL    Comment: Glucose reference range applies only to samples taken after fasting for at least 8 hours.   BUN 12 8 - 23 mg/dL   Creatinine, Ser 0.75 0.44 - 1.00  mg/dL   Calcium 8.3 (L) 8.9 - 10.3 mg/dL   GFR calc non Af Amer >60 >60 mL/min   GFR calc Af Amer >60 >60 mL/min   Anion gap 9 5 - 15    Comment: Performed at [facility], [place] [address], [place], [place] [address]   CT Head Wo Contrast  Result Date: 06/14/2019 CLINICAL DATA:  Encephalopathy. Generalized weakness. EXAM: CT HEAD WITHOUT CONTRAST TECHNIQUE: Contiguous axial images were obtained from the base of the skull through the vertex without intravenous contrast. COMPARISON:  11/16/2018 FINDINGS: Brain: No intracranial hemorrhage, mass effect, or midline shift. Generalized atrophy. No hydrocephalus. The basilar cisterns are patent. Moderate to advanced chronic small vessel ischemia. No evidence of territorial infarct or acute ischemia. Stable calcified left posterior fossa wall 10 x 7 mm meningioma. No extra-axial or intracranial fluid collection. Vascular: Atherosclerosis of skullbase vasculature without hyperdense vessel or abnormal calcification. Skull: No fracture or focal lesion. Sinuses/Orbits: Paranasal sinuses and mastoid air cells are clear. The visualized orbits are unremarkable. Bilateral cataract resection. Other: None. IMPRESSION: 1. No acute intracranial abnormality. 2. Stable atrophy and chronic small vessel ischemia. 3. Stable small left posterior fossa meningioma. Electronically Signed   By: Keith Rake M.D.   On: 06/14/2019 19:09   MR BRAIN WO CONTRAST  Result Date: 06/15/2019 CLINICAL DATA:  Focal neuro deficit. Rule out stroke. Alzheimer's, COVID positive history. EXAM: MRI HEAD WITHOUT CONTRAST TECHNIQUE: Multiplanar, multiecho pulse sequences of the brain and surrounding structures were obtained without intravenous contrast. COMPARISON:  CT head 06/14/2019 FINDINGS: Brain: Motion degraded study. Moderate to advanced atrophy.  Negative for hydrocephalus. Negative for acute infarct. Moderate to advanced chronic microvascular ischemic changes in the white  matter bilaterally. Negative for hemorrhage. 10 mm calcific mass posterior to the left cerebellum compatible with meningioma. No cerebral edema is present. No change from 2013 MRI. Vascular: Normal arterial flow voids. Skull and upper cervical spine: No focal skeletal lesion. Sinuses/Orbits: Paranasal sinuses clear. Bilateral cataract extraction Other: None IMPRESSION: Motion degraded study Moderate to advanced atrophy with extensive chronic microvascular ischemia No acute infarct 1 cm calcific meningioma in the left posterior fossa appears unchanged from 2013. Electronically Signed   By: Franchot Gallo M.D.   On: 06/15/2019 11:23   DG Chest Port 1 View  Result Date: 06/14/2019 CLINICAL DATA:  Weakness and falling EXAM: PORTABLE CHEST 1 VIEW COMPARISON:  11/16/2018 FINDINGS: Artifact overlies the chest. Heart size is normal. Chronic aortic atherosclerosis. Lungs are clear allowing for the AP semi-erect positioning. No evidence of heart failure or effusion. Chronic spinal curvature. IMPRESSION: No active disease.  Aortic atherosclerosis. Electronically Signed   By: Nelson Chimes M.D.   On: 06/14/2019 19:15    Pending Labs Unresulted Labs (From admission, onward)    Start     Ordered   06/16/19 0500  Magnesium  Tomorrow morning,   R     06/15/19 0533   06/16/19 0500  CBC WITH DIFFERENTIAL  Tomorrow morning,   R     06/15/19 0533   06/16/19 0500  Comprehensive metabolic panel  Tomorrow morning,   R     06/15/19 0533   06/16/19 0500  C-reactive protein  Daily,   R     06/15/19 0725   06/16/19 0500  D-dimer, quantitative (not at [facility])  Daily,   R     06/15/19 0725   06/16/19 0500  Ferritin  Daily,   R     06/15/19 0725   06/16/19 0500  TSH  Tomorrow morning,   R     06/15/19 1406   06/15/19 0225  Urine Culture  Add-on,   AD     06/15/19 0224          Vitals/Pain Today's Vitals   06/15/19 1730 06/15/19 1745 06/15/19 1800 06/15/19 1815  BP: (!) 150/79 (!) 152/95 (!) 161/89 (!) 147/80   Pulse: 74 86 79 80  Resp: (!) 22 20 19  (!) 21  Temp:      TempSrc:      SpO2: 94% (!) 87% 93%     Isolation Precautions Airborne and Contact precautions  Medications Medications  cefTRIAXone (ROCEPHIN) 1 g in sodium chloride 0.9 % 100 mL IVPB (has no administration in time range)  amLODipine (NORVASC) tablet 5 mg (5 mg Oral Given 06/15/19 1009)  buPROPion (WELLBUTRIN XL) 24 hr tablet 150 mg (has no administration in time range)  venlafaxine XR (EFFEXOR-XR) 24 hr capsule 75 mg (75 mg Oral Given 06/15/19 1009)  levothyroxine (SYNTHROID) tablet 88 mcg (88 mcg Oral Given 06/15/19 1008)  polyethylene glycol (MIRALAX / GLYCOLAX) packet 17 g (has no administration in time range)  ondansetron (ZOFRAN) tablet 4 mg (has no administration in time range)    Or  ondansetron (ZOFRAN) injection 4 mg (has no administration in time range)  enoxaparin (LOVENOX) injection 40 mg (40 mg Subcutaneous Given 06/15/19 1309)  acetaminophen (TYLENOL) tablet 650 mg (has no administration in time range)    Or  acetaminophen (TYLENOL) suppository 650 mg (has no administration in time range)  lactated ringers infusion ( Intravenous Restarted 06/15/19 1054)  memantine (NAMENDA XR) 24 hr capsule 28 mg (has no administration in time range)  And  donepezil (ARICEPT) tablet 10 mg (has no administration in time range)  dexamethasone (DECADRON) injection 6 mg (6 mg Intravenous Given 06/15/19 1644)  remdesivir 100 mg in sodium chloride 0.9 % 100 mL IVPB (100 mg Intravenous New Bag/Given 06/15/19 1649)    Followed by  remdesivir 100 mg in sodium chloride 0.9 % 100 mL IVPB (100 mg Intravenous New Bag/Given 06/15/19 1650)    Followed by  remdesivir 100 mg in sodium chloride 0.9 % 100 mL IVPB (has no administration in time range)  sodium chloride 0.9 % bolus 1,000 mL (0 mLs Intravenous Stopped 06/15/19 0101)  cefTRIAXone (ROCEPHIN) 1 g in sodium chloride 0.9 % 100 mL IVPB (0 g Intravenous Stopped 06/15/19 0101)  potassium  chloride SA (KLOR-CON) CR tablet 40 mEq (40 mEq Oral Given 06/14/19 2326)  0.9 % NaCl with KCl 20 mEq/ L  infusion ( Intravenous Stopped 06/15/19 0257)    Mobility walks

## 2019-06-15 NOTE — ED Notes (Signed)
Pt removed second IV. Catheter intact.

## 2019-06-16 LAB — CBC WITH DIFFERENTIAL/PLATELET
Abs Immature Granulocytes: 0.02 10*3/uL (ref 0.00–0.07)
Basophils Absolute: 0 10*3/uL (ref 0.0–0.1)
Basophils Relative: 0 %
Eosinophils Absolute: 0 10*3/uL (ref 0.0–0.5)
Eosinophils Relative: 0 %
HCT: 42.3 % (ref 36.0–46.0)
Hemoglobin: 13.8 g/dL (ref 12.0–15.0)
Immature Granulocytes: 1 %
Lymphocytes Relative: 22 %
Lymphs Abs: 0.8 10*3/uL (ref 0.7–4.0)
MCH: 33.6 pg (ref 26.0–34.0)
MCHC: 32.6 g/dL (ref 30.0–36.0)
MCV: 102.9 fL — ABNORMAL HIGH (ref 80.0–100.0)
Monocytes Absolute: 0.1 10*3/uL (ref 0.1–1.0)
Monocytes Relative: 3 %
Neutro Abs: 2.6 10*3/uL (ref 1.7–7.7)
Neutrophils Relative %: 74 %
Platelets: 192 10*3/uL (ref 150–400)
RBC: 4.11 MIL/uL (ref 3.87–5.11)
RDW: 13.1 % (ref 11.5–15.5)
WBC: 3.4 10*3/uL — ABNORMAL LOW (ref 4.0–10.5)
nRBC: 0 % (ref 0.0–0.2)

## 2019-06-16 LAB — COMPREHENSIVE METABOLIC PANEL
ALT: 16 U/L (ref 0–44)
AST: 23 U/L (ref 15–41)
Albumin: 3.4 g/dL — ABNORMAL LOW (ref 3.5–5.0)
Alkaline Phosphatase: 90 U/L (ref 38–126)
Anion gap: 13 (ref 5–15)
BUN: 14 mg/dL (ref 8–23)
CO2: 21 mmol/L — ABNORMAL LOW (ref 22–32)
Calcium: 8.4 mg/dL — ABNORMAL LOW (ref 8.9–10.3)
Chloride: 104 mmol/L (ref 98–111)
Creatinine, Ser: 0.66 mg/dL (ref 0.44–1.00)
GFR calc Af Amer: >60 mL/min (ref >60)
GFR calc non Af Amer: >60 mL/min (ref >60)
Glucose, Bld: 121 mg/dL — ABNORMAL HIGH (ref 70–99)
Potassium: 3.1 mmol/L — ABNORMAL LOW (ref 3.5–5.1)
Sodium: 138 mmol/L (ref 135–145)
Total Bilirubin: 0.5 mg/dL (ref 0.3–1.2)
Total Protein: 6.5 g/dL (ref 6.5–8.1)

## 2019-06-16 LAB — D-DIMER, QUANTITATIVE: D-Dimer, Quant: 0.62 ug/mL-FEU — ABNORMAL HIGH (ref 0.00–0.50)

## 2019-06-16 LAB — FERRITIN: Ferritin: 63 ng/mL (ref 11–307)

## 2019-06-16 LAB — C-REACTIVE PROTEIN: CRP: 1.3 mg/dL — ABNORMAL HIGH (ref <1.0)

## 2019-06-16 LAB — MAGNESIUM: Magnesium: 1.8 mg/dL (ref 1.7–2.4)

## 2019-06-16 LAB — TSH: TSH: 0.227 u[IU]/mL — ABNORMAL LOW (ref 0.350–4.500)

## 2019-06-16 MED ORDER — POTASSIUM CHLORIDE 10 MEQ/100ML IV SOLN
10.0000 meq | INTRAVENOUS | Status: AC
  Administered 2019-06-16 (×3): 10 meq via INTRAVENOUS
  Filled 2019-06-16 (×3): qty 100

## 2019-06-16 MED ORDER — OLANZAPINE 10 MG IM SOLR
2.5000 mg | Freq: Once | INTRAMUSCULAR | Status: DC
  Filled 2019-06-16: qty 10

## 2019-06-16 MED ORDER — ENSURE ENLIVE PO LIQD
237.0000 mL | Freq: Two times a day (BID) | ORAL | Status: DC
  Administered 2019-06-17 – 2019-06-22 (×6): 237 mL via ORAL

## 2019-06-16 MED ORDER — HALOPERIDOL LACTATE 5 MG/ML IJ SOLN
2.0000 mg | Freq: Once | INTRAMUSCULAR | Status: AC
  Administered 2019-06-16: 2 mg via INTRAMUSCULAR
  Filled 2019-06-16: qty 1

## 2019-06-16 NOTE — Progress Notes (Signed)
Initial Nutrition Assessment  DOCUMENTATION CODES:   Not applicable  INTERVENTION:  Ensure Enlive po BID, each supplement provides 350 kcal and 20 grams of protein Magic cup TID with meals, each supplement provides 290 kcal and 9 grams of protein Recommend regular diet order Consider feeding assistance with meals to encourage po intake Recommend obtaining new weight as able to fully assess needs   NUTRITION DIAGNOSIS:   Inadequate oral intake related to acute illness, chronic illness(complicated UTI, [id number] virus infection; Alzheimer's dementia) as evidenced by meal completion < 25%.    GOAL:   Patient will meet greater than or equal to 90% of their needs    MONITOR:   PO intake, Weight trends, Supplement acceptance, Diet advancement, I & O's, Labs  REASON FOR ASSESSMENT:   Consult Assessment of nutrition requirement/status  ASSESSMENT:  RD working remotely.  86 year old female with past medical history of Alzheimer's dementia, GERD, depression, HTN, HLD, and vitamin D deficiency admitted with complicated UTI and found to be COVID-19 positive on admission after presenting with acute onset significant difficulty with ambulation and family reports nonproductive cough for approximately 3 days prior to presentation.  Unable to obtain nutrition history at this time. Per chart review, patient is a very poor historian secondary to Alzheimer's dementia, HPI was obtained from patient's daughter via phone conversation on admission.   She is on heart healthy diet, per flowsheets she consumed 5% of her lunch today. Recommend regular diet as well as considering feeding assistance with meals given dementia to encourage po intake. Will order Ensure and Magic Cup supplements to aid with meeting needs.  No new wt on admission Per history pt weighed 124.74 lbs on 05/26/19, on 10/22/18 pt weighed 130.02 lbs, noted stable 130-134 lbs from January 2020 - September 2020. Recommend obtaining  current weight as able to assess recent trends.   Medications reviewed and include: Decadron IVPB: Rocephin, Remdesivir  Labs: K 3.1 (L) trending down   NUTRITION - FOCUSED PHYSICAL EXAM: Unable to complete at this time, RD working remotely.  Diet Order:   Diet Order            Diet Heart Room service appropriate? Yes; Fluid consistency: Thin  Diet effective now              EDUCATION NEEDS:   Not appropriate for education at this time  Skin:  Skin Assessment: Reviewed RN Assessment  Last BM:  PTA  Height:   Ht Readings from Last 1 Encounters:  06/16/19 5\' 4"  (1.626 m)    Weight:   Wt Readings from Last 1 Encounters:  05/26/19 56.7 kg    BMI:  Body mass index is 21.46 kg/m.  Estimated Nutritional Needs:   Kcal:  1750-2000  Protein:  90-100  Fluid:  >/= 1.8 L/day   Lajuan Lines, RD, LDN Clinical Nutrition After Hours/Weekend Pager # in [place]

## 2019-06-16 NOTE — Progress Notes (Signed)
PROGRESS NOTE    SINDEL LEY  [id number] DOB: [birth year] DOA: 06/14/2019 PCP: [name], NP   Brief Narrative: 86 year old female with significant medical history of Alzheimer's dementia GERD, depression, hypertension, hyperlipidemia, vitamin D deficiency presented to the ED with her daughter due to concern of rapid onset of weakness and difficulty with ambulation. Patient is a poor historian, history was obtained from patient's family via phone conversation on admission. As per admitting- daughter explains that as recently as this past Sunday the patient was walking down the street using her walker which is her baseline level of activity.  Daughter explains that by Monday morning shortly after breakfast the patient suddenly began to exhibit significant difficulty with ambulation, requiring a two-person assist.  The daughter denies that the patient has developed focal weakness of one side of the body.  The daughter also does not endorse slurring of words, changes in vision or difficulty with tolerating oral intake.  The daughter does state that the patient has had a dry nonproductive cough for approximately 3 days but denies any recent episodes of fever, nausea, vomiting, complaints of dysuria, pain or diarrhea. There have been no recent sick contacts.  Of note, patient has received 1 dose of her COVID-19 vaccine with the second dose due on May 12.  The daughter does not recall which brand of vaccination she is receiving.  Patient was evaluated at her primary care provider's office by Erie Noe NP earlier in the day on 4/27 which the patient was advised to after go to [facility] emergency department for further evaluation. In the ED,CT head no acute finding, suspected to have UTI and further work-up found out she was positive for Covid. Patient's daughter interested in MRI and was ordered.  Subjective:  Patient is more alert awake today able to tell me her name,follows  instruction moving all her extremities. Nursing reports she has been sleeping more, not eating well. Leaning to right. Remains on room air spo2 varies:91-97% Afebrile overnight,TSH 0.22.  Assessment & Plan:  Rapidly worsening weakness/motor dysfunction,difficult ambulation:Suspecting multifactorial secondary to UTI, COVID-19 infection. CT head no acute finding. B12/Folate-Nl,TSH low-0 check FT4. There was question of possible left-sided weakness on admission-MRI brain was ordered "Motion degraded study Moderate to advanced atrophy with extensive chronic microvascular ischemia No acute infarct 1 cm calcific meningioma in the left posterior fossa appears unchanged from 2013". Will  Continue on antibiotics, placed on Decadron/remdesivir for Covid infection and also continue PT OT.  Complicated UTI:UA [id number], many bacteria, leukocytes large.urine culture more than 100,000 gram-negative rods, continue ceftriaxone follow-up culture.  Blood culture no growth so far.    COVID-19 virus infection:X-ray is unremarkable and she is not hypoxic.Inflammatory markers are relatively stable. Patient has a significant weakness which could be contributed by her COVID-19 infection as well.cont remdesivir  Day # 2/5 and Decadron day #2/10 (spot <94 on RA). Recent Labs    06/15/19 0955 06/16/19 0109  DDIMER 0.91* 0.62*  FERRITIN 57 63  LDH 219*  --   CRP 0.9 1.3*   Lab Results  Component Value Date   SARSCOV2NAA POSITIVE (A) 06/14/2019   [place] NEGATIVE 08/25/2018   Hypokalemia: Potassium at 3.1-will replete again.  Metabolic acidosis with bicarb at 21.  encouarged po hydration.  Dehydration BUN/creatinine up at 20/1.1 admission now at 14/0.6.  Encourage po, nutrition eval  Hypothyroidism: Continue home Synthroid. TSH normal in the past years, her low- check FT4.  Essential hypertension: Pressure is well controlled, continue home  amlodipine.  Alzheimer's dementia with behavioral disturbance:continue  Namenda, Aricept and venlafaxine and bupropion.  On admission needed to be placed on mitten.  Continue supportive care. Encourage ambulation PT OT eval.  DVT prophylaxis:lovenox. Code Status: DNR Family Communication: plan of care discussed with patient's daughter over the phone.  The treatment plan and use of medications and known side effects were discussed with patient/family, they were clearly explained that there is no proven definitive treatment for COVID-19 infection, any medications used here are based on published clinical articles/anecdotal data which are not peer-reviewed or randomized control trials.  Complete risks and long-term side effects are unknown, however in the best clinical judgment they seem to be of some clinical benefit rather than medical risks.  Patient/family agree with the treatment plan and want to receive the given medications.  Status is: Inpatient The patient will require care spanning > 2 midnights and should be moved to inpatient because:Inpatient level of care appropriate due to severity of illness, for management of UTI, covid virus infection.  Dispo: The patient is from: Home             Anticipated d/c is to: home vs SNF.  Obtain PT OT evaluation             Anticipated d/c date is: > 3 days             Patient currently is not medically stable to d/c.  Diet Order            Diet Heart Room service appropriate? Yes; Fluid consistency: Thin  Diet effective now             Body mass index is 21.46 kg/m.  Consultants:see note  Procedures:see note Microbiology: Gram-negative rods in urine culture.    Medications: Scheduled Meds: . amLODipine  5 mg Oral Daily  . buPROPion  150 mg Oral QHS  . dexamethasone (DECADRON) injection  6 mg Intravenous Daily  . memantine  28 mg Oral QHS   And  . donepezil  10 mg Oral QHS  . enoxaparin (LOVENOX) injection  40 mg Subcutaneous Q24H  . levothyroxine  88 mcg Oral Q0600  . venlafaxine XR  75 mg Oral Q  breakfast   Continuous Infusions: . cefTRIAXone (ROCEPHIN)  IV Stopped (06/15/19 2226)  . potassium chloride 10 mEq (06/16/19 1232)  . remdesivir 100 mg in NS 100 mL Stopped (06/16/19 0906)    Antimicrobials: Anti-infectives (From admission, onward)   Start     Dose/Rate Route Frequency Ordered Stop   06/16/19 1000  remdesivir 100 mg in sodium chloride 0.9 % 100 mL IVPB     100 mg 200 mL/hr over 30 Minutes Intravenous Daily 06/15/19 1422 06/20/19 0959   06/15/19 2300  cefTRIAXone (ROCEPHIN) 1 g in sodium chloride 0.9 % 100 mL IVPB     1 g 200 mL/hr over 30 Minutes Intravenous Every 24 hours 06/15/19 0248     06/15/19 1530  remdesivir 100 mg in sodium chloride 0.9 % 100 mL IVPB     100 mg 200 mL/hr over 30 Minutes Intravenous  Once 06/15/19 1422 06/15/19 1720   06/15/19 1430  remdesivir 100 mg in sodium chloride 0.9 % 100 mL IVPB     100 mg 200 mL/hr over 30 Minutes Intravenous  Once 06/15/19 1422 06/15/19 1719   06/14/19 2330  fosfomycin (MONUROL) packet 3 g  Status:  Discontinued     3 g Oral  Once 06/14/19 2247 06/14/19 2310  06/14/19 2315  cefTRIAXone (ROCEPHIN) 1 g in sodium chloride 0.9 % 100 mL IVPB     1 g 200 mL/hr over 30 Minutes Intravenous  Once 06/14/19 2310 06/15/19 0101     Objective: Vitals: Today's Vitals   06/16/19 0359 06/16/19 0800 06/16/19 1112 06/16/19 1220  BP: 125/81   (!) 141/81  Pulse: 71   77  Resp: 16   20  Temp: (!) 97.4 F (36.3 C)   97.7 F (36.5 C)  TempSrc: Oral   Axillary  SpO2: 97%   93%  Height:   5\' 4"  (1.626 m)   PainSc:  0-No pain      Intake/Output Summary (Last 24 hours) at 06/16/2019 1311 Last data filed at 06/16/2019 1221 Gross per 24 hour  Intake 330 ml  Output 100 ml  Net 230 ml   There were no vitals filed for this visit. Weight change:    Intake/Output from previous day: No intake/output data recorded. Intake/Output this shift: Total I/O In: 330 [P.O.:30; IV Piggyback:300] Out: 100  [Urine:100]  Examination:  General exam: Alert awake oriented x2, appears ill frail, on room air. HEENT:Oral mucosa moist, Ear/Nose WNL grossly,dentition normal. Respiratory system: bilaterally clear, no wheezing or crackles,no use of accessory muscle, non tender. Cardiovascular system: S1 & S2 +,regular, No JVD. Gastrointestinal system: Abdomen soft, NT,ND, BS+. Nervous System:Alert, awake, moving all extremities and grossly nonfocal Extremities: No edema, distal peripheral pulses palpable.  Skin: No rashes,no icterus. MSK: Normal muscle bulk,tone, power  Data Reviewed: I have personally reviewed following labs and imaging studies CBC: Recent Labs  Lab 06/14/19 1831 06/16/19 0109  WBC 6.2 3.4*  NEUTROABS 3.7 2.6  HGB 13.5 13.8  HCT 41.3 42.3  MCV 104.0* 102.9*  PLT 194 [id number]   Basic Metabolic Panel: Recent Labs  Lab 06/14/19 1831 06/15/19 1452 06/16/19 0109  NA 140 138 138  K 2.9* 3.2* 3.1*  CL 106 104 104  CO2 24 25 21*  GLUCOSE 105* 95 121*  BUN 20 12 14   CREATININE 1.07* 0.75 0.66  CALCIUM 8.4* 8.3* 8.4*  MG  --   --  1.8   GFR: CrCl cannot be calculated (Unknown ideal weight.). Liver Function Tests: Recent Labs  Lab 06/14/19 1831 06/16/19 0109  AST 20 23  ALT 17 16  ALKPHOS 106 90  BILITOT 0.4 0.5  PROT 7.2 6.5  ALBUMIN 3.8 3.4*   No results for input(s): LIPASE, AMYLASE in the last 168 hours. No results for input(s): AMMONIA in the last 168 hours. Coagulation Profile: No results for input(s): INR, PROTIME in the last 168 hours. Cardiac Enzymes: No results for input(s): CKTOTAL, CKMB, CKMBINDEX, TROPONINI in the last 168 hours. BNP (last 3 results) No results for input(s): PROBNP in the last 8760 hours. HbA1C: No results for input(s): HGBA1C in the last 72 hours. CBG: No results for input(s): GLUCAP in the last 168 hours. Lipid Profile: Recent Labs    06/15/19 0405  CHOL 191  HDL 63  LDLCALC 115*  TRIG 64  CHOLHDL 3.0   Thyroid  Function Tests: Recent Labs    06/16/19 0109  TSH 0.227*   Anemia Panel: Recent Labs    06/15/19 0405 06/15/19 0955 06/16/19 0109  VITAMINB12 1,368*  --   --   FOLATE 8.0  --   --   FERRITIN  --  57 63   Sepsis Labs: Recent Labs  Lab 06/15/19 0225 06/15/19 0412  LATICACIDVEN 0.8 1.6    Recent Results (from  the past 240 hour(s))  Urine Culture     Status: Abnormal (Preliminary result)   Collection Time: 06/14/19  9:57 PM   Specimen: Urine, Random  Result Value Ref Range Status   Specimen Description   Final    URINE, RANDOM Performed at [facility] [address], [place], [place] [address]    Special Requests   Final    NONE Performed at [facility], [place] [address] [place], [place] [address]    Culture (A)  Final    >=100,000 COLONIES/mL GRAM NEGATIVE RODS IDENTIFICATION AND SUSCEPTIBILITIES TO FOLLOW Performed at [facility], [place] [address] [place], [place] [address]    Report Status PENDING  Incomplete  Respiratory Panel by RT PCR (Flu A&B, Covid) - Nasopharyngeal Swab     Status: Abnormal   Collection Time: 06/14/19 11:11 PM   Specimen: Nasopharyngeal Swab  Result Value Ref Range Status   SARS Coronavirus 2 by RT PCR POSITIVE (A) NEGATIVE Final    Comment: RESULT CALLED TO, READ BACK BY AND VERIFIED WITH: BULLOCK,A @ [id number] ON [id number] BY POTEAT,S (NOTE) SARS-CoV-2 target nucleic acids are DETECTED. SARS-CoV-2 RNA is generally detectable in upper respiratory specimens  during the acute phase of infection. Positive results are indicative of the presence of the identified virus, but do not rule out bacterial infection or co-infection with other pathogens not detected by the test. Clinical correlation with patient history and other diagnostic information is necessary to determine patient infection status. The expected result is Negative. Fact Sheet for Patients:  PinkCheek.be Fact  Sheet for Healthcare Providers: GravelBags.it This test is not yet approved or cleared by the [place] FDA and  has been authorized for detection and/or diagnosis of SARS-CoV-2 by FDA under an Emergency Use Authorization (EUA).  This EUA will remain in effect (meaning this test can be used ) for the duration of  the COVID-19 declaration under Section 564(b)(1) of the Act, 21 U.S.C. section 360bbb-3(b)(1), unless the authorization is terminated or revoked sooner.    Influenza A by PCR NEGATIVE NEGATIVE Final   Influenza B by PCR NEGATIVE NEGATIVE Final    Comment: (NOTE) The Xpert Xpress SARS-CoV-2/FLU/RSV assay is intended as an aid in  the diagnosis of influenza from Nasopharyngeal swab specimens and  should not be used as a sole basis for treatment. Nasal washings and  aspirates are unacceptable for Xpert Xpress SARS-CoV-2/FLU/RSV  testing. Fact Sheet for Patients: PinkCheek.be Fact Sheet for Healthcare Providers: GravelBags.it This test is not yet approved or cleared by the [place] FDA and  has been authorized for detection and/or diagnosis of SARS-CoV-2 by  FDA under an Emergency Use Authorization (EUA). This EUA will remain  in effect (meaning this test can be used) for the duration of the  Covid-19 declaration under Section 564(b)(1) of the Act, 21  U.S.C. section 360bbb-3(b)(1), unless the authorization is  terminated or revoked. Performed at [facility], [place] [address], [place], [place] [address]   Culture, blood (routine x 2)     Status: None (Preliminary result)   Collection Time: 06/15/19  2:25 AM   Specimen: BLOOD  Result Value Ref Range Status   Specimen Description   Final    BLOOD LEFT ANTECUBITAL Performed at [facility] [address] [place], [place] [address]    Special Requests   Final    BOTTLES DRAWN AEROBIC AND  ANAEROBIC Blood Culture adequate volume Performed at [facility], [place] [address], [place], [place] [address]    Culture   Final    NO GROWTH 1 DAY Performed at [facility], [place] [address], [place], [place] [address]    Report Status PENDING  Incomplete  Culture, blood (routine x 2)     Status: None (Preliminary result)   Collection Time: 06/15/19  2:30 AM   Specimen: BLOOD  Result Value Ref Range Status   Specimen Description   Final    BLOOD RIGHT ANTECUBITAL Performed at [facility] [address] [place], [place] [address]    Special Requests   Final    BOTTLES DRAWN AEROBIC AND ANAEROBIC Blood Culture results may not be optimal due to an inadequate volume of blood received in culture bottles Performed at [facility] [address], [place], [place] [address]    Culture   Final    NO GROWTH 1 DAY Performed at [facility], [place] [address], [place], [place] [address]    Report Status PENDING  Incomplete      Radiology Studies: CT Head Wo Contrast  Result Date: 06/14/2019 CLINICAL DATA:  Encephalopathy. Generalized weakness. EXAM: CT HEAD WITHOUT CONTRAST TECHNIQUE: Contiguous axial images were obtained from the base of the skull through the vertex without intravenous contrast. COMPARISON:  11/16/2018 FINDINGS: Brain: No intracranial hemorrhage, mass effect, or midline shift. Generalized atrophy. No hydrocephalus. The basilar cisterns are patent. Moderate to advanced chronic small vessel ischemia. No evidence of territorial infarct or acute ischemia. Stable calcified left posterior fossa wall 10 x 7 mm meningioma. No extra-axial or intracranial fluid collection. Vascular: Atherosclerosis of skullbase vasculature without hyperdense vessel or abnormal calcification. Skull: No fracture or focal lesion. Sinuses/Orbits: Paranasal sinuses and mastoid air cells are clear. The visualized orbits are unremarkable.  Bilateral cataract resection. Other: None. IMPRESSION: 1. No acute intracranial abnormality. 2. Stable atrophy and chronic small vessel ischemia. 3. Stable small left posterior fossa meningioma. Electronically Signed   By: Keith Rake M.D.   On: 06/14/2019 19:09   MR BRAIN WO CONTRAST  Result Date: 06/15/2019 CLINICAL DATA:  Focal neuro deficit. Rule out stroke. Alzheimer's, COVID positive history. EXAM: MRI HEAD WITHOUT CONTRAST TECHNIQUE: Multiplanar, multiecho pulse sequences of the brain and surrounding structures were obtained without intravenous contrast. COMPARISON:  CT head 06/14/2019 FINDINGS: Brain: Motion degraded study. Moderate to advanced atrophy.  Negative for hydrocephalus. Negative for acute infarct. Moderate to advanced chronic microvascular ischemic changes in the white matter bilaterally. Negative for hemorrhage. 10 mm calcific mass posterior to the left cerebellum compatible with meningioma. No cerebral edema is present. No change from 2013 MRI. Vascular: Normal arterial flow voids. Skull and upper cervical spine: No focal skeletal lesion. Sinuses/Orbits: Paranasal sinuses clear. Bilateral cataract extraction Other: None IMPRESSION: Motion degraded study Moderate to advanced atrophy with extensive chronic microvascular ischemia No acute infarct 1 cm calcific meningioma in the left posterior fossa appears unchanged from 2013. Electronically Signed   By: Franchot Gallo M.D.   On: 06/15/2019 11:23   DG Chest Port 1 View  Result Date: 06/14/2019 CLINICAL DATA:  Weakness and falling EXAM: PORTABLE CHEST 1 VIEW COMPARISON:  11/16/2018 FINDINGS: Artifact overlies the chest. Heart size is normal. Chronic aortic atherosclerosis. Lungs are clear allowing for the AP semi-erect positioning. No evidence of heart failure or effusion. Chronic spinal curvature. IMPRESSION: No active disease.  Aortic atherosclerosis. Electronically Signed   By: Nelson Chimes M.D.   On: 06/14/2019 19:15  LOS: 1  day   Time spent: More than 50% of that time was spent in counseling and/or coordination of care.  Antonieta Pert, MD [facility]  06/16/2019, 1:11 PM

## 2019-06-17 LAB — CBC
HCT: 38.8 % (ref 36.0–46.0)
Hemoglobin: 13.4 g/dL (ref 12.0–15.0)
MCH: 34.6 pg — ABNORMAL HIGH (ref 26.0–34.0)
MCHC: 34.5 g/dL (ref 30.0–36.0)
MCV: 100.3 fL — ABNORMAL HIGH (ref 80.0–100.0)
Platelets: 207 10*3/uL (ref 150–400)
RBC: 3.87 MIL/uL (ref 3.87–5.11)
RDW: 12.9 % (ref 11.5–15.5)
WBC: 7.2 10*3/uL (ref 4.0–10.5)
nRBC: 0 % (ref 0.0–0.2)

## 2019-06-17 LAB — COMPREHENSIVE METABOLIC PANEL
ALT: 16 U/L (ref 0–44)
AST: 25 U/L (ref 15–41)
Albumin: 3.4 g/dL — ABNORMAL LOW (ref 3.5–5.0)
Alkaline Phosphatase: 85 U/L (ref 38–126)
Anion gap: 10 (ref 5–15)
BUN: 26 mg/dL — ABNORMAL HIGH (ref 8–23)
CO2: 23 mmol/L (ref 22–32)
Calcium: 8.4 mg/dL — ABNORMAL LOW (ref 8.9–10.3)
Chloride: 105 mmol/L (ref 98–111)
Creatinine, Ser: 0.9 mg/dL (ref 0.44–1.00)
GFR calc Af Amer: >60 mL/min (ref >60)
GFR calc non Af Amer: 58 mL/min — ABNORMAL LOW (ref >60)
Glucose, Bld: 105 mg/dL — ABNORMAL HIGH (ref 70–99)
Potassium: 3.2 mmol/L — ABNORMAL LOW (ref 3.5–5.1)
Sodium: 138 mmol/L (ref 135–145)
Total Bilirubin: 0.7 mg/dL (ref 0.3–1.2)
Total Protein: 6.1 g/dL — ABNORMAL LOW (ref 6.5–8.1)

## 2019-06-17 LAB — URINE CULTURE: Culture: >=100000 — AB

## 2019-06-17 LAB — FERRITIN: Ferritin: 66 ng/mL (ref 11–307)

## 2019-06-17 LAB — C-REACTIVE PROTEIN: CRP: 0.7 mg/dL (ref <1.0)

## 2019-06-17 LAB — D-DIMER, QUANTITATIVE: D-Dimer, Quant: 0.7 ug/mL-FEU — ABNORMAL HIGH (ref 0.00–0.50)

## 2019-06-17 MED ORDER — LORAZEPAM 2 MG/ML IJ SOLN
0.5000 mg | Freq: Once | INTRAMUSCULAR | Status: AC
  Administered 2019-06-17: 0.5 mg via INTRAMUSCULAR
  Filled 2019-06-17: qty 1

## 2019-06-17 MED ORDER — POTASSIUM CHLORIDE 20 MEQ PO PACK
40.0000 meq | PACK | Freq: Two times a day (BID) | ORAL | Status: AC
  Administered 2019-06-17 (×2): 40 meq via ORAL
  Filled 2019-06-17 (×3): qty 2

## 2019-06-17 MED ORDER — CEFAZOLIN SODIUM-DEXTROSE 1-4 GM/50ML-% IV SOLN
1.0000 g | Freq: Two times a day (BID) | INTRAVENOUS | Status: DC
  Administered 2019-06-18 – 2019-06-21 (×7): 1 g via INTRAVENOUS
  Filled 2019-06-17 (×8): qty 50

## 2019-06-17 MED ORDER — LORAZEPAM 2 MG/ML IJ SOLN: 0.5000 mg | Freq: Once | INTRAMUSCULAR | Status: DC

## 2019-06-17 NOTE — Progress Notes (Signed)
This IV Vast Nurse at bedside, pt. REFUSED IV start, RN notified.

## 2019-06-17 NOTE — NC FL2 (Signed)
[place] LEVEL OF CARE SCREENING TOOL     IDENTIFICATION  Patient Name: [name] Birthdate: March 29, 1931 Sex: female Admission Date (Current Location): 06/14/2019  [facility] [place] Number:  Herbalist and Address:  [facility],  [place] [address] [place]      Provider Number:    Attending Physician Name and Address:  Shawna Clamp, MD  Relative Name and Phone Number:  Yaakov Guthrie (Daughter) [phone] [facility])    Current Level of Care: Hospital Recommended Level of Care: [place] Prior Approval Number:    Date Approved/Denied:   PASRR Number: [id number]  Discharge Plan: SNF    Current Diagnoses: Patient Active Problem List   Diagnosis Date Noted  . Complicated UTI (urinary tract infection) 06/15/2019  . Generalized weakness 06/15/2019  . Alzheimer's dementia with behavioral disturbance ([place]) 06/15/2019  . COVID-19 virus infection 06/15/2019  . BRBPR (bright red blood per rectum) 06/30/2017  . Colitis 06/30/2017  . Protein-calorie malnutrition, severe 11/13/2016  . Multiple falls   . Encephalopathy   . Agitation   . Slurred speech 11/10/2016  . Vitamin D deficiency 03/07/2015  . Hx of fracture of vertebral column 12/28/2014  . Constipation 07/27/2013  . Fracture of vertebra 07/27/2013  . Loss of weight 07/13/2013  . Peripheral vascular disease, unspecified ([place]) 07/13/2013  . Epigastric mass 07/13/2013  . Dementia with behavioral disturbance ([place])   . Major depression ([place])   . Insomnia, unspecified   . Fall 09/07/2012  . Fracture of right distal radius 09/07/2012  . Hypothyroidism 08/18/2006  . HYPERCHOLESTEROLEMIA 08/18/2006  . Essential hypertension 08/18/2006  . Restless legs 08/18/2006    Orientation RESPIRATION BLADDER Height & Weight     Self  Normal External catheter Weight:   Height:  5\' 4"  (162.6 cm)  BEHAVIORAL SYMPTOMS/MOOD NEUROLOGICAL BOWEL NUTRITION STATUS   (none) Continent Diet(see dc summary)  AMBULATORY STATUS COMMUNICATION OF NEEDS Skin   Extensive Assist Verbally Normal                       Personal Care Assistance Level of Assistance  Bathing, Feeding, Dressing Bathing Assistance: Maximum assistance Feeding assistance: Limited assistance Dressing Assistance: Maximum assistance     Functional Limitations Info  Sight, Hearing, Speech Sight Info: Adequate Hearing Info: Adequate Speech Info: Adequate    SPECIAL CARE FACTORS FREQUENCY  PT (By licensed PT), OT (By licensed OT)     PT Frequency: 5x/week OT Frequency: 5x/week            Contractures Contractures Info: Not present    Additional Factors Info  Code Status, Allergies, Isolation Precautions Code Status Info: DNR Allergies Info: no known allergies     Isolation Precautions Info: Covid positive on 06/14/19     Current Medications (06/17/2019):  This is the current hospital active medication list Current Facility-Administered Medications  Medication Dose Route Frequency Provider Last Rate Last Admin  . acetaminophen (TYLENOL) tablet 650 mg  650 mg Oral Q6H PRN Vernelle Emerald, MD   650 mg at 06/15/19 2156   Or  . acetaminophen (TYLENOL) suppository 650 mg  650 mg Rectal Q6H PRN Vernelle Emerald, MD      . amLODipine (NORVASC) tablet 5 mg  5 mg Oral Daily Shalhoub, Sherryll Burger, MD   5 mg at 06/16/19 0834  . buPROPion (WELLBUTRIN XL) 24 hr tablet 150 mg  150 mg Oral QHS Shalhoub, Sherryll Burger, MD   150  mg at 06/16/19 2159  . cefTRIAXone (ROCEPHIN) 1 g in sodium chloride 0.9 % 100 mL IVPB  1 g Intravenous Q24H Vernelle Emerald, MD 200 mL/hr at 06/16/19 2159 1 g at 06/16/19 2159  . dexamethasone (DECADRON) injection 6 mg  6 mg Intravenous Daily Kc, Ramesh, MD   6 mg at 06/16/19 0834  . memantine (NAMENDA XR) 24 hr capsule 28 mg  28 mg Oral QHS Green, Terri L, RPH   28 mg at 06/16/19 2201   And  . donepezil (ARICEPT) tablet 10 mg  10 mg Oral QHS Minda Ditto,  RPH   10 mg at 06/16/19 2159  . enoxaparin (LOVENOX) injection 40 mg  40 mg Subcutaneous Q24H Shalhoub, Sherryll Burger, MD   40 mg at 06/16/19 0834  . feeding supplement (ENSURE ENLIVE) (ENSURE ENLIVE) liquid 237 mL  237 mL Oral BID BM Kc, Ramesh, MD      . levothyroxine (SYNTHROID) tablet 88 mcg  88 mcg Oral Q0600 Vernelle Emerald, MD   88 mcg at 06/17/19 [id number]  . ondansetron (ZOFRAN) tablet 4 mg  4 mg Oral Q6H PRN Shalhoub, Sherryll Burger, MD       Or  . ondansetron [facility]) injection 4 mg  4 mg Intravenous Q6H PRN Shalhoub, Sherryll Burger, MD      . polyethylene glycol (MIRALAX / GLYCOLAX) packet 17 g  17 g Oral Daily PRN Shalhoub, Sherryll Burger, MD      . potassium chloride (KLOR-CON) packet 40 mEq  40 mEq Oral BID Shawna Clamp, MD      . remdesivir 100 mg in sodium chloride 0.9 % 100 mL IVPB  100 mg Intravenous Daily Angela Adam, [place] at 06/16/19 [id number]  . venlafaxine XR (EFFEXOR-XR) 24 hr capsule 75 mg  75 mg Oral Q breakfast Shalhoub, Sherryll Burger, MD   75 mg at 06/16/19 [id number]     Discharge Medications: Please see discharge summary for a list of discharge medications.  Relevant Imaging Results:  Relevant Lab Results:   Additional Information SS#: [id number]  Bethann Berkshire, LCSW

## 2019-06-17 NOTE — Progress Notes (Signed)
PROGRESS NOTE    [name]  [id number] DOB: July 26, 1931 DOA: 06/14/2019 PCP: [name], NP   Brief Narrative:  This 86 year old female with significant medical history of Alzheimer's dementia GERD, depression, hypertension, hyperlipidemia, vitamin D deficiency presented to the ED with her daughter due to concerns of rapid onset of weakness and difficulty with ambulation. Patient is a poor historian, history was obtained from patient's family via phone conversation on admission. As per admitting- daughter explains that as recently as this past Sunday the patient was walking down the street using her walker which is her baseline level of activity. Daughter explains that by Monday morning shortly after breakfast the patient suddenly began to exhibit significant difficulty with ambulation, requiring a two-person assist. The daughter denies that the patient has developed focal weakness of one side of the body. The daughter also does not endorse slurring of words, changes in vision or difficulty with tolerating oral intake. The daughter does state that the patient has had a dry nonproductive cough for approximately 3 days but denies any recent episodes of fever, nausea, vomiting, complaints of dysuria, pain or diarrhea. There have been no recent sick contacts.  Of note, patient has received 1 dose of her COVID-19 vaccine with the second dose due on May 12. The daughter does not recall which brand of vaccination she is receiving.  Patient was evaluated at her primary care provider's office byDina Ngetich NPearlier in the day on 4/27 which the patient was advised to go to [facility] emergency department for further evaluation. In the ED, CT head:  no acute finding, suspected to have UTI and further work-up found out she was positive for Covid. Patient's daughter interested in MRI and was ordered.   Assessment & Plan:   Principal Problem:   Complicated UTI (urinary tract  infection) Active Problems:   Hypothyroidism   Essential hypertension   Generalized weakness   Alzheimer's dementia with behavioral disturbance ([place])   COVID-19 virus infection  Rapidly worsening weakness/motor dysfunction,difficult ambulation:Suspecting multifactorial secondary to UTI, COVID-19 infection. CT head:  no acute findings. B12/Folate-NL,TSH low-0 check FT4. There was question of possible left-sided weakness on admission-MRI brain was ordered "Motion degraded study Moderate to advanced atrophy with extensive chronic microvascular ischemia No acute infarct,  1 cm calcific meningioma in the left posterior fossa appears unchanged from 2013". Will  Continue on antibiotics, placed on Decadron /remdesivir for Covid infection and also continue PT OT.  Complicated UTI:UA [id number], many bacteria, leukocytes large.urine culture more than 100,000 gram-negative rods, continue ceftriaxone follow-up culture.  Blood culture no growth so far.    COVID-19 virus infection:X-ray is unremarkable and she is not hypoxic.Inflammatory markers are relatively stable. Patient has a significant weakness which could be contributed by her COVID-19 infection as well. Cont remdesivir  Day # 3/5 and Decadron day #3/10 (spot <94 on RA).   COVID-19 Labs  Recent Labs    06/15/19 0955 06/16/19 0109 06/17/19 0725  DDIMER 0.91* 0.62* 0.70*  FERRITIN 57 63 66  LDH 219*  --   --   CRP 0.9 1.3* 0.7    Lab Results  Component Value Date   SARSCOV2NAA POSITIVE (A) 06/14/2019   [place] NEGATIVE 08/25/2018    Hypokalemia: Potassium at 3.2-will replete again.  Metabolic acidosis -Resolved.  bicarb 23.  encouarged po hydration.  Dehydration - Resolved.  BUN/creatinine up at 20/1.1 admission now at 14/0.6.  Encourage po, nutrition eval  Hypothyroidism: Continue home Synthroid. TSH normal in the past  years, her low- check FT4.  Essential hypertension: Pressure is well controlled, continue home  amlodipine.  Alzheimer's dementia with behavioral disturbance:continue Namenda, Aricept and venlafaxine and bupropion.  On admission needed to be placed on mitten.  Continue supportive care. Encourage ambulation PT OT eval.  DVT prophylaxis:lovenox. Code Status: DNR Family Communication: plan of care discussed with patient's daughter over the phone yesterday.  The treatment plan and use of medications and known side effects were discussed with patient/family, they were clearly explained that there is no proven definitive treatment for COVID-19 infection, any medications used here are based on published clinical articles/anecdotal data which are not peer-reviewed or randomized control trials.  Complete risks and long-term side effects are unknown, however in the best clinical judgment they seem to be of some clinical benefit rather than medical risks.  Patient/family agree with the treatment plan and want to receive the given medications.  Status is: Inpatient The patient will require care spanning > 2 midnights and should be moved to inpatient because:Inpatient level of care appropriate due to severity of illness, for management of UTI, covid virus infection.  Dispo: The patient is from: Home  Anticipated d/c is to: home vs SNF.  Obtain PT OT evaluation Anticipated d/c date is: > 3 days Patient currently is not medically stable to d/c.     Consultants:   None  Procedures: None.  Antimicrobials:  Anti-infectives (From admission, onward)   Start     Dose/Rate Route Frequency Ordered Stop   06/17/19 2200  ceFAZolin (ANCEF) IVPB 1 g/50 mL premix     1 g 100 mL/hr over 30 Minutes Intravenous Every 12 hours 06/17/19 1231     06/16/19 1000  remdesivir 100 mg in sodium chloride 0.9 % 100 mL IVPB     100 mg 200 mL/hr over 30 Minutes Intravenous Daily 06/15/19 1422 06/20/19 0959   06/15/19 2300  cefTRIAXone (ROCEPHIN) 1 g in sodium chloride 0.9 % 100 mL  IVPB  Status:  Discontinued     1 g 200 mL/hr over 30 Minutes Intravenous Every 24 hours 06/15/19 0248 06/17/19 1231   06/15/19 1530  remdesivir 100 mg in sodium chloride 0.9 % 100 mL IVPB     100 mg 200 mL/hr over 30 Minutes Intravenous  Once 06/15/19 1422 06/15/19 1720   06/15/19 1430  remdesivir 100 mg in sodium chloride 0.9 % 100 mL IVPB     100 mg 200 mL/hr over 30 Minutes Intravenous  Once 06/15/19 1422 06/15/19 1719   06/14/19 2330  fosfomycin (MONUROL) packet 3 g  Status:  Discontinued     3 g Oral  Once 06/14/19 2247 06/14/19 2310   06/14/19 2315  cefTRIAXone (ROCEPHIN) 1 g in sodium chloride 0.9 % 100 mL IVPB     1 g 200 mL/hr over 30 Minutes Intravenous  Once 06/14/19 2310 06/15/19 0101       Subjective: Patient was seen and examined at bedside.  She is alert awake but confused.  She follows directions.  Moving all extremities.  He remains on room air saturation 91 to 97%.  Objective: Vitals:   06/16/19 1220 06/16/19 2121 06/17/19 0517 06/17/19 1331  BP: (!) 141/81 (!) 162/90 (!) 172/89   Pulse: 77 71 64 62  Resp: 20 14 18    Temp: 97.7 F (36.5 C) (!) 97.4 F (36.3 C) 97.7 F (36.5 C)   TempSrc: Axillary Oral Oral   SpO2: 93% 94% 99% 95%  Height:  Intake/Output Summary (Last 24 hours) at 06/17/2019 1528 Last data filed at 06/17/2019 1030 Gross per 24 hour  Intake 120 ml  Output 200 ml  Net -80 ml   There were no vitals filed for this visit.  Examination:  General exam: Appears calm and comfortable  Respiratory system: Clear to auscultation. Respiratory effort normal. Cardiovascular system: S1 & S2 heard, RRR. No JVD, murmurs, rubs, gallops or clicks. No pedal edema. Gastrointestinal system: Abdomen is nondistended, soft and nontender. No organomegaly or masses felt. Normal bowel sounds heard. Central nervous system: Alert and oriented. No focal neurological deficits. Extremities:  Moves all 4 extremities. Skin: No rashes, lesions or  ulcers Psychiatry: Judgement and insight appear normal. Mood & affect appropriate.     Data Reviewed: I have personally reviewed following labs and imaging studies  CBC: Recent Labs  Lab 06/14/19 1831 06/16/19 0109 06/17/19 0725  WBC 6.2 3.4* 7.2  NEUTROABS 3.7 2.6  --   HGB 13.5 13.8 13.4  HCT 41.3 42.3 38.8  MCV 104.0* 102.9* 100.3*  PLT 194 192 [id number]   Basic Metabolic Panel: Recent Labs  Lab 06/14/19 1831 06/15/19 1452 06/16/19 0109 06/17/19 0725  NA 140 138 138 138  K 2.9* 3.2* 3.1* 3.2*  CL 106 104 104 105  CO2 24 25 21* 23  GLUCOSE 105* 95 121* 105*  BUN 20 12 14  26*  CREATININE 1.07* 0.75 0.66 0.90  CALCIUM 8.4* 8.3* 8.4* 8.4*  MG  --   --  1.8  --    GFR: CrCl cannot be calculated (Unknown ideal weight.). Liver Function Tests: Recent Labs  Lab 06/14/19 1831 06/16/19 0109 06/17/19 0725  AST 20 23 25   ALT 17 16 16   ALKPHOS 106 90 85  BILITOT 0.4 0.5 0.7  PROT 7.2 6.5 6.1*  ALBUMIN 3.8 3.4* 3.4*   No results for input(s): LIPASE, AMYLASE in the last 168 hours. No results for input(s): AMMONIA in the last 168 hours. Coagulation Profile: No results for input(s): INR, PROTIME in the last 168 hours. Cardiac Enzymes: No results for input(s): CKTOTAL, CKMB, CKMBINDEX, TROPONINI in the last 168 hours. BNP (last 3 results) No results for input(s): PROBNP in the last 8760 hours. HbA1C: No results for input(s): HGBA1C in the last 72 hours. CBG: No results for input(s): GLUCAP in the last 168 hours. Lipid Profile: Recent Labs    06/15/19 0405  CHOL 191  HDL 63  LDLCALC 115*  TRIG 64  CHOLHDL 3.0   Thyroid Function Tests: Recent Labs    06/16/19 0109  TSH 0.227*   Anemia Panel: Recent Labs    06/15/19 0405 06/15/19 0955 06/16/19 0109 06/17/19 0725  [id number] 1,368*  --   --   --   FOLATE 8.0  --   --   --   FERRITIN  --    < > 63 66   < > = values in this interval not displayed.   Sepsis Labs: Recent Labs  Lab 06/15/19 0225  06/15/19 0412  LATICACIDVEN 0.8 1.6    Recent Results (from the past 240 hour(s))  Urine Culture     Status: Abnormal   Collection Time: 06/14/19  9:57 PM   Specimen: Urine, Random  Result Value Ref Range Status   Specimen Description   Final    URINE, RANDOM Performed at [facility] [address] [place], [place] [address]    Special Requests   Final    NONE Performed at [facility], [place] [address] [place], [place] [address]    Culture >=100,000 COLONIES/mL PROTEUS MIRABILIS (A)  Final   Report Status 06/17/2019 FINAL  Final   Organism ID, Bacteria PROTEUS MIRABILIS (A)  Final      Susceptibility   Proteus mirabilis - MIC*    AMPICILLIN <=2 SENSITIVE Sensitive     CEFAZOLIN <=4 SENSITIVE Sensitive     CEFTRIAXONE <=1 SENSITIVE Sensitive     CIPROFLOXACIN <=0.25 SENSITIVE Sensitive     GENTAMICIN <=1 SENSITIVE Sensitive     IMIPENEM 1 SENSITIVE Sensitive     NITROFURANTOIN 256 RESISTANT Resistant     TRIMETH/SULFA <=20 SENSITIVE Sensitive     AMPICILLIN/SULBACTAM <=2 SENSITIVE Sensitive     * >=100,000 COLONIES/mL PROTEUS MIRABILIS  Respiratory Panel by RT PCR (Flu A&B, Covid) - Nasopharyngeal Swab     Status: Abnormal   Collection Time: 06/14/19 11:11 PM   Specimen: Nasopharyngeal Swab  Result Value Ref Range Status   SARS Coronavirus 2 by RT PCR POSITIVE (A) NEGATIVE Final    Comment: RESULT CALLED TO, READ BACK BY AND VERIFIED WITH: BULLOCK,A @ [id number] ON [id number] BY POTEAT,S (NOTE) SARS-CoV-2 target nucleic acids are DETECTED. SARS-CoV-2 RNA is generally detectable in upper respiratory specimens  during the acute phase of infection. Positive results are indicative of the presence of the identified virus, but do not rule out bacterial infection or co-infection with other pathogens not detected by the test. Clinical correlation with patient history and other diagnostic information is necessary to determine patient infection  status. The expected result is Negative. Fact Sheet for Patients:  PinkCheek.be Fact Sheet for Healthcare Providers: GravelBags.it This test is not yet approved or cleared by the [place] FDA and  has been authorized for detection and/or diagnosis of SARS-CoV-2 by FDA under an Emergency Use Authorization (EUA).  This EUA will remain in effect (meaning this test can be used ) for the duration of  the COVID-19 declaration under Section 564(b)(1) of the Act, 21 U.S.C. section 360bbb-3(b)(1), unless the authorization is terminated or revoked sooner.    Influenza A by PCR NEGATIVE NEGATIVE Final   Influenza B by PCR NEGATIVE NEGATIVE Final    Comment: (NOTE) The Xpert Xpress SARS-CoV-2/FLU/RSV assay is intended as an aid in  the diagnosis of influenza from Nasopharyngeal swab specimens and  should not be used as a sole basis for treatment. Nasal washings and  aspirates are unacceptable for Xpert Xpress SARS-CoV-2/FLU/RSV  testing. Fact Sheet for Patients: PinkCheek.be Fact Sheet for Healthcare Providers: GravelBags.it This test is not yet approved or cleared by the [place] FDA and  has been authorized for detection and/or diagnosis of SARS-CoV-2 by  FDA under an Emergency Use Authorization (EUA). This EUA will remain  in effect (meaning this test can be used) for the duration of the  Covid-19 declaration under Section 564(b)(1) of the Act, 21  U.S.C. section 360bbb-3(b)(1), unless the authorization is  terminated or revoked. Performed at [facility], [place] [address], [place], [place] [address]   Culture, blood (routine x 2)     Status: None (Preliminary result)   Collection Time: 06/15/19  2:25 AM   Specimen: BLOOD  Result Value Ref Range Status   Specimen Description   Final    BLOOD LEFT ANTECUBITAL Performed at [facility] [address] [place], [place] [address]    Special Requests   Final    BOTTLES DRAWN AEROBIC AND ANAEROBIC Blood Culture adequate volume  Performed at [facility], [place] [address], [place], [place] [address]    Culture   Final    NO GROWTH 2 DAYS Performed at [facility] [address], [place], [place] [address]    Report Status PENDING  Incomplete  Culture, blood (routine x 2)     Status: None (Preliminary result)   Collection Time: 06/15/19  2:30 AM   Specimen: BLOOD  Result Value Ref Range Status   Specimen Description   Final    BLOOD RIGHT ANTECUBITAL Performed at [facility] [address] [place], [place] [address]    Special Requests   Final    BOTTLES DRAWN AEROBIC AND ANAEROBIC Blood Culture results may not be optimal due to an inadequate volume of blood received in culture bottles Performed at [facility] [address] [place], [place] [address]    Culture   Final    NO GROWTH 2 DAYS Performed at [facility] [address] [place],  [address]    Report Status PENDING  Incomplete     Radiology Studies: No results found.  Scheduled Meds: . amLODipine  5 mg Oral Daily  . buPROPion  150 mg Oral QHS  . dexamethasone (DECADRON) injection  6 mg Intravenous Daily  . memantine  28 mg Oral QHS   And  . donepezil  10 mg Oral QHS  . enoxaparin (LOVENOX) injection  40 mg Subcutaneous Q24H  . feeding supplement (ENSURE ENLIVE)  237 mL Oral BID BM  . levothyroxine  88 mcg Oral Q0600  . potassium chloride  40 mEq Oral BID  . venlafaxine XR  75 mg Oral Q breakfast   Continuous Infusions: .  ceFAZolin (ANCEF) IV    . remdesivir 100 mg in NS 100 mL 100 mg (06/17/19 1319)     LOS: 2 days    Time spent: 35 mins.   Shawna Clamp, MD [facility]   If 7PM-7AM, please contact night-coverage

## 2019-06-17 NOTE — TOC Progression Note (Addendum)
Transition of Care [facility]) - Progression Note    Patient Details  Name: [name] MRN: [id number] Date of Birth: 03-14-1931  Transition of Care [facility]) CM/SW [place], [place] Phone Number: 06/17/2019, 3:01 PM  Clinical Narrative:     Contacted daughter, [name] and notified her that [facility] denied and [facility] accepted. Daughter wants CSW to send request again to [facility] at later time since pt is not discharging yet.    Expected Discharge Plan: Skilled Nursing Facility Barriers to Discharge: Continued Medical Work up  Expected Discharge Plan and Services Expected Discharge Plan: [place]                                               Social Determinants of Health (SDOH) Interventions    Readmission Risk Interventions No flowsheet data found.

## 2019-06-17 NOTE — TOC Initial Note (Addendum)
Transition of Care [facility]) - Initial/Assessment Note    Patient Details  Name: [name] MRN: [id number] Date of Birth: [birth year]  Transition of Care [facility]) CM/SW Contact:    Bethann Berkshire, [place] Phone Number: 06/17/2019, 9:36 AM  Clinical Narrative:                  CSW called daughter [name]. Daughter is okay with SNF recommendation and understand that options are limited due to pts covid status. CSW presents medicare star ratings to [place] for [place], [place], and [place]. Daughter requests to send info to [place] and [place] and wait to send request to [place]. [name] requests CSW to leave a message if she does not answer. SNF requests sent to [facility] and [facility].   Expected Discharge Plan: Skilled Nursing Facility Barriers to Discharge: Continued Medical Work up   Patient Goals and CMS Choice   CMS Medicare.gov Compare Post Acute Care list provided to:: Patient Represenative (must comment)([name]) Choice offered to / list presented to : Adult Children  Expected Discharge Plan and Services Expected Discharge Plan: [place]                                              Prior Living Arrangements/Services     Patient language and need for interpreter reviewed:: Yes        Need for Family Participation in Patient Care: Yes (Comment)     Criminal Activity/Legal Involvement Pertinent to Current Situation/Hospitalization: No - Comment as needed  Activities of Daily Living Home Assistive Devices/Equipment: Eyeglasses, Environmental consultant (specify type), Wheelchair, Other (Comment)(front wheeled walker, tub bench, tub/shower unit) ADL Screening (condition at time of admission) Patient's cognitive ability adequate to safely complete daily activities?: No Is the patient deaf or have difficulty hearing?: Yes(HOH) Does the patient have difficulty seeing, even when wearing glasses/contacts?: No Does the patient have difficulty concentrating, remembering,  or making decisions?: Yes Patient able to express need for assistance with ADLs?: No Does the patient have difficulty dressing or bathing?: Yes Independently performs ADLs?: No Communication: Independent Dressing (OT): Needs assistance Is this a change from baseline?: Pre-admission baseline Grooming: Needs assistance Is this a change from baseline?: Pre-admission baseline Feeding: Independent Bathing: Needs assistance Is this a change from baseline?: Pre-admission baseline Toileting: Needs assistance Is this a change from baseline?: Pre-admission baseline In/Out Bed: Needs assistance Is this a change from baseline?: Pre-admission baseline Walks in Home: Needs assistance Is this a change from baseline?: Pre-admission baseline Does the patient have difficulty walking or climbing stairs?: Yes(secondary to weakness) Weakness of Legs: Both Weakness of Arms/Hands: None  Permission Sought/Granted                  Emotional Assessment         Alcohol / Substance Use: Not Applicable Psych Involvement: No (comment)  Admission diagnosis:  Hypokalemia [E87.6] Lower urinary tract infectious disease [N39.0] Weakness [id number] Complicated UTI (urinary tract infection) [N39.0] COVID-19 virus infection [U07.1] Patient Active Problem List   Diagnosis Date Noted  . Complicated UTI (urinary tract infection) 06/15/2019  . Generalized weakness 06/15/2019  . Alzheimer's dementia with behavioral disturbance ([place]) 06/15/2019  . COVID-19 virus infection 06/15/2019  . BRBPR (bright red blood per rectum) 06/30/2017  . Colitis 06/30/2017  . Protein-calorie malnutrition, severe 11/13/2016  . Multiple falls   . Encephalopathy   . Agitation   .  Slurred speech 11/10/2016  . Vitamin D deficiency 03/07/2015  . Hx of fracture of vertebral column 12/28/2014  . Constipation 07/27/2013  . Fracture of vertebra 07/27/2013  . Loss of weight 07/13/2013  . Peripheral vascular disease, unspecified  ([place]) 07/13/2013  . Epigastric mass 07/13/2013  . Dementia with behavioral disturbance ([place])   . Major depression ([place])   . Insomnia, unspecified   . Fall 09/07/2012  . Fracture of right distal radius 09/07/2012  . Hypothyroidism 08/18/2006  . HYPERCHOLESTEROLEMIA 08/18/2006  . Essential hypertension 08/18/2006  . Restless legs 08/18/2006   PCP:  [name], NP Pharmacy:   [id number] - [place], [place] [address] Phone: [phone] Fax: [phone]  [facility] - [place], [place] [address] Phone: [phone] Fax: [place], [address] AT [facility] [address] [address] [place] [address] Phone: [phone] Fax: [phone]     Social Determinants of Health (SDOH) Interventions    Readmission Risk Interventions No flowsheet data found.

## 2019-06-17 NOTE — Progress Notes (Signed)
Patient was very sleepy today on 1 of occassions that she was awake patient wanted paper and pencil to draw while she ate her breakfast. Patient did take her am medications.RN encouraged PO intake during rounds without much success pt stated that she was too sleepy. Patient O2 sats remained 95 and above on RA this shift.

## 2019-06-17 NOTE — Progress Notes (Signed)
Pt. Is without access. She pulled out pervious PIV. She is currently refusing and fighting against staff to put in another one. Provider [place] notified.

## 2019-06-18 ENCOUNTER — Inpatient Hospital Stay (HOSPITAL_COMMUNITY): Payer: Medicare HMO

## 2019-06-18 DIAGNOSIS — U071 COVID-19: Secondary | ICD-10-CM

## 2019-06-18 HISTORY — DX: COVID-19: U07.1

## 2019-06-18 LAB — CBC
HCT: 40.9 % (ref 36.0–46.0)
HCT: 41.9 % (ref 36.0–46.0)
Hemoglobin: 13.7 g/dL (ref 12.0–15.0)
Hemoglobin: 13.7 g/dL (ref 12.0–15.0)
MCH: 33.4 pg (ref 26.0–34.0)
MCH: 33.7 pg (ref 26.0–34.0)
MCHC: 33.5 g/dL (ref 30.0–36.0)
MCHC: 32.7 g/dL (ref 30.0–36.0)
MCV: 99.8 fL (ref 80.0–100.0)
MCV: 102.9 fL — ABNORMAL HIGH (ref 80.0–100.0)
Platelets: 215 10*3/uL (ref 150–400)
Platelets: 235 10*3/uL (ref 150–400)
RBC: 4.1 MIL/uL (ref 3.87–5.11)
RBC: 4.07 MIL/uL (ref 3.87–5.11)
RDW: 13 % (ref 11.5–15.5)
RDW: 13 % (ref 11.5–15.5)
WBC: 7 10*3/uL (ref 4.0–10.5)
WBC: 7.1 10*3/uL (ref 4.0–10.5)
nRBC: 0 % (ref 0.0–0.2)
nRBC: 0 % (ref 0.0–0.2)

## 2019-06-18 LAB — FERRITIN: Ferritin: 67 ng/mL (ref 11–307)

## 2019-06-18 LAB — C-REACTIVE PROTEIN: CRP: 0.6 mg/dL (ref <1.0)

## 2019-06-18 LAB — COMPREHENSIVE METABOLIC PANEL
ALT: 22 U/L (ref 0–44)
AST: 31 U/L (ref 15–41)
Albumin: 3.7 g/dL (ref 3.5–5.0)
Alkaline Phosphatase: 85 U/L (ref 38–126)
Anion gap: 9 (ref 5–15)
BUN: 29 mg/dL — ABNORMAL HIGH (ref 8–23)
CO2: 25 mmol/L (ref 22–32)
Calcium: 8.6 mg/dL — ABNORMAL LOW (ref 8.9–10.3)
Chloride: 108 mmol/L (ref 98–111)
Creatinine, Ser: 0.88 mg/dL (ref 0.44–1.00)
GFR calc Af Amer: >60 mL/min (ref >60)
GFR calc non Af Amer: 59 mL/min — ABNORMAL LOW (ref >60)
Glucose, Bld: 108 mg/dL — ABNORMAL HIGH (ref 70–99)
Potassium: 3.6 mmol/L (ref 3.5–5.1)
Sodium: 142 mmol/L (ref 135–145)
Total Bilirubin: 0.6 mg/dL (ref 0.3–1.2)
Total Protein: 6.5 g/dL (ref 6.5–8.1)

## 2019-06-18 LAB — D-DIMER, QUANTITATIVE: D-Dimer, Quant: 0.59 ug/mL-FEU — ABNORMAL HIGH (ref 0.00–0.50)

## 2019-06-18 MED ORDER — SODIUM CHLORIDE 0.9 % IV SOLN
INTRAVENOUS | Status: DC | PRN
  Administered 2019-06-18 – 2019-06-20 (×2): 250 mL via INTRAVENOUS

## 2019-06-18 MED ORDER — QUETIAPINE FUMARATE 25 MG PO TABS: 25.0000 mg | ORAL_TABLET | Freq: Once | ORAL | Status: DC

## 2019-06-18 NOTE — Progress Notes (Signed)
Patient is cooperative with moving in bed. Patient does not open her eyes, follows commands. Unable to get her alert enough to take morning medications. Will try again soon

## 2019-06-18 NOTE — Progress Notes (Signed)
CSW received a call from RN stating pt's daughter [name] at ph: [phone] is requesting an update to her question if pt's admission to [facility] can be re-attempted as pt was denied on Friday 06/17/19 per the notes.  Sat shift CSW will leave handoff for 1st shift Sunday CSW.  CSW will continue to follow for D/C needs.  [name]. [name]  MSW, LCSW, LCAS, CSI Transitions of Care Clinical Social Worker Care Coordination Department Ph: [phone]

## 2019-06-18 NOTE — Progress Notes (Signed)
PROGRESS NOTE    [name]  [id number] DOB: 04-Aug-1931 DOA: 06/14/2019 PCP: [name], NP   Brief Narrative:  This 86 year old female with significant medical history of Alzheimer's dementia GERD, depression, hypertension, hyperlipidemia, vitamin D deficiency presented to the ED with her daughter due to concerns of rapid onset of weakness and difficulty with ambulation. Patient is a poor historian, history was obtained from patient's family via phone conversation on admission. As per admitting- daughter explains that as recently as this past Sunday the patient was walking down the street using her walker which is her baseline level of activity. Daughter explains that by Monday morning shortly after breakfast the patient suddenly began to exhibit significant difficulty with ambulation, requiring a two-person assist. The daughter denies that the patient has developed focal weakness of one side of the body. The daughter also does not endorse slurring of words, changes in vision or difficulty with tolerating oral intake. The daughter does state that the patient has had a dry nonproductive cough for approximately 3 days but denies any recent episodes of fever, nausea, vomiting, complaints of dysuria, pain or diarrhea. There have been no recent sick contacts.  Of note, patient has received 1 dose of her COVID-19 vaccine with the second dose due on May 12. The daughter does not recall which brand of vaccination she is receiving.  Patient was evaluated at her primary care provider's office byDina Ngetich NPearlier in the day on 4/27 which the patient was advised to go to [facility] emergency department for further evaluation. In the ED, CT head:  no acute findings, suspected to have UTI and further work-up found out,  she was positive for Covid. Patient's daughter interested in MRI and was ordered.   Assessment & Plan:   Principal Problem:   Complicated UTI (urinary  tract infection) Active Problems:   Hypothyroidism   Essential hypertension   Generalized weakness   Alzheimer's dementia with behavioral disturbance ([name])   COVID-19 virus infection  Rapidly worsening weakness/motor dysfunction,difficult ambulation:Suspecting multifactorial secondary to UTI, COVID-19 infection. CT head:  no acute findings. B12/Folate-NL,TSH low-0 check FT4. There was question of possible left-sided weakness on admission-MRI brain was ordered "Motion degraded study Moderate to advanced atrophy with extensive chronic microvascular ischemia No acute infarct,  1 cm calcific meningioma in the left posterior fossa appears unchanged from 2013". Will  Continue on antibiotics, placed on Decadron /remdesivir for Covid infection and also continue PT OT.  Complicated UTI:UA [id number], many bacteria, leukocytes large.urine culture more than 100,000 gram-negative rods, continue ceftriaxone follow-up culture.  Blood culture no growth so far.    COVID-19 virus infection:X-ray is unremarkable and she is not hypoxic.Inflammatory markers are relatively stable. Patient has a significant weakness which could be contributed by her COVID-19 infection as well. Cont remdesivir  Day # 4/5 and Decadron day #4/10 (spot <94 on RA).   COVID-19 Labs  Recent Labs    06/16/19 0109 06/17/19 0725 06/18/19 0418  DDIMER 0.62* 0.70* 0.59*  FERRITIN 63 66 67  CRP 1.3* 0.7 0.6    Lab Results  Component Value Date   SARSCOV2NAA POSITIVE (A) 06/14/2019   [place] NEGATIVE 08/25/2018    Hypokalemia:Resolved.   Metabolic acidosis -Resolved.  bicarb 23.  encouarged po hydration.  Dehydration - Resolved.  BUN/creatinine up at 20/1.1 admission now at 14/0.6.  Encourage po, nutrition eval  Hypothyroidism: Continue home Synthroid. TSH normal in the past years, her low- check FT4.  Essential hypertension: Pressure is well controlled,  continue home amlodipine.  Alzheimer's dementia with behavioral  disturbance:continue Namenda, Aricept and venlafaxine and bupropion.  On admission needed to be placed on mitten.  Continue supportive care. Encourage ambulation PT OT eval.  DVT prophylaxis:lovenox. Code Status: DNR Family Communication: plan of care discussed with patient's daughter over the phone yesterday.  The treatment plan and use of medications and known side effects were discussed with patient/family, they were clearly explained that there is no proven definitive treatment for COVID-19 infection, any medications used here are based on published clinical articles/anecdotal data which are not peer-reviewed or randomized control trials.  Complete risks and long-term side effects are unknown, however in the best clinical judgment they seem to be of some clinical benefit rather than medical risks.  Patient/family agree with the treatment plan and want to receive the given medications.  Status is: Inpatient The patient will require care spanning > 2 midnights and should be moved to inpatient because:Inpatient level of care appropriate due to severity of illness, for management of UTI, covid virus infection.  Dispo: The patient is from: Home  Anticipated d/c is to: home vs SNF.  Obtain PT OT evaluation Anticipated d/c date is: > 3 days Patient currently is not medically stable to d/c.     Consultants:   None  Procedures: None.  Antimicrobials:  Anti-infectives (From admission, onward)   Start     Dose/Rate Route Frequency Ordered Stop   06/17/19 2200  ceFAZolin (ANCEF) IVPB 1 g/50 mL premix     1 g 100 mL/hr over 30 Minutes Intravenous Every 12 hours 06/17/19 1231     06/16/19 1000  remdesivir 100 mg in sodium chloride 0.9 % 100 mL IVPB     100 mg 200 mL/hr over 30 Minutes Intravenous Daily 06/15/19 1422 06/20/19 0959   06/15/19 2300  cefTRIAXone (ROCEPHIN) 1 g in sodium chloride 0.9 % 100 mL IVPB  Status:  Discontinued     1 g 200 mL/hr over  30 Minutes Intravenous Every 24 hours 06/15/19 0248 06/17/19 1231   06/15/19 1530  remdesivir 100 mg in sodium chloride 0.9 % 100 mL IVPB     100 mg 200 mL/hr over 30 Minutes Intravenous  Once 06/15/19 1422 06/15/19 1720   06/15/19 1430  remdesivir 100 mg in sodium chloride 0.9 % 100 mL IVPB     100 mg 200 mL/hr over 30 Minutes Intravenous  Once 06/15/19 1422 06/15/19 1719   06/14/19 2330  fosfomycin (MONUROL) packet 3 g  Status:  Discontinued     3 g Oral  Once 06/14/19 2247 06/14/19 2310   06/14/19 2315  cefTRIAXone (ROCEPHIN) 1 g in sodium chloride 0.9 % 100 mL IVPB     1 g 200 mL/hr over 30 Minutes Intravenous  Once 06/14/19 2310 06/15/19 0101       Subjective: Patient was seen and examined at bedside.  She is alert, awake but confused.  She follows directions.  Moving all extremities.  He remains on room air saturation 91 to 97%.  Objective: Vitals:   06/17/19 2321 06/18/19 0548 06/18/19 0909 06/18/19 1231  BP: 130/74 130/74 (!) 153/97 (!) 167/85  Pulse: 75 73 74 88  Resp: 16 20  19   Temp: 97.7 F (36.5 C) 98.2 F (36.8 C)  (!) 97.5 F (36.4 C)  TempSrc: Oral Oral  Oral  SpO2: 94% 95% 100% 96%  Weight:      Height:        Intake/Output Summary (Last 24 hours) at  06/18/2019 1346 Last data filed at 06/18/2019 1239 Gross per 24 hour  Intake 0 ml  Output 1300 ml  Net -1300 ml   Filed Weights   06/17/19 1712  Weight: 55.9 kg    Examination:  General exam: Appears calm and comfortable  Respiratory system: Clear to auscultation. Respiratory effort normal. Cardiovascular system: S1 & S2 heard, RRR. No JVD, murmurs, rubs, gallops or clicks. No pedal edema. Gastrointestinal system: Abdomen is nondistended, soft and nontender. No organomegaly or masses felt. Normal bowel sounds heard. Central nervous system: Alert and oriented. No focal neurological deficits. Extremities:  Moves all 4 extremities. Skin: No rashes, lesions or ulcers Psychiatry: Judgement and insight  appear normal. Mood & affect appropriate.     Data Reviewed: I have personally reviewed following labs and imaging studies  CBC: Recent Labs  Lab 06/14/19 1831 06/16/19 0109 06/17/19 0725 06/18/19 0418  WBC 6.2 3.4* 7.2 7.0  NEUTROABS 3.7 2.6  --   --   HGB 13.5 13.8 13.4 13.7  HCT 41.3 42.3 38.8 40.9  MCV 104.0* 102.9* 100.3* 99.8  PLT 194 192 207 [id number]   Basic Metabolic Panel: Recent Labs  Lab 06/14/19 1831 06/15/19 1452 06/16/19 0109 06/17/19 0725 06/18/19 0418  NA 140 138 138 138 142  K 2.9* 3.2* 3.1* 3.2* 3.6  CL 106 104 104 105 108  CO2 24 25 21* 23 25  GLUCOSE 105* 95 121* 105* 108*  BUN 20 12 14  26* 29*  CREATININE 1.07* 0.75 0.66 0.90 0.88  CALCIUM 8.4* 8.3* 8.4* 8.4* 8.6*  MG  --   --  1.8  --   --    GFR: Estimated Creatinine Clearance: 39.6 mL/min (by C-G formula based on SCr of 0.88 mg/dL). Liver Function Tests: Recent Labs  Lab 06/14/19 1831 06/16/19 0109 06/17/19 0725 06/18/19 0418  AST 20 23 25 31   ALT 17 16 16 22   ALKPHOS 106 90 85 85  BILITOT 0.4 0.5 0.7 0.6  PROT 7.2 6.5 6.1* 6.5  ALBUMIN 3.8 3.4* 3.4* 3.7   No results for input(s): LIPASE, AMYLASE in the last 168 hours. No results for input(s): AMMONIA in the last 168 hours. Coagulation Profile: No results for input(s): INR, PROTIME in the last 168 hours. Cardiac Enzymes: No results for input(s): CKTOTAL, CKMB, CKMBINDEX, TROPONINI in the last 168 hours. BNP (last 3 results) No results for input(s): PROBNP in the last 8760 hours. HbA1C: No results for input(s): HGBA1C in the last 72 hours. CBG: No results for input(s): GLUCAP in the last 168 hours. Lipid Profile: No results for input(s): CHOL, HDL, LDLCALC, TRIG, CHOLHDL, LDLDIRECT in the last 72 hours. Thyroid Function Tests: Recent Labs    06/16/19 0109  TSH 0.227*   Anemia Panel: Recent Labs    06/17/19 0725 06/18/19 0418  FERRITIN 66 67   Sepsis Labs: Recent Labs  Lab 06/15/19 0225 06/15/19 0412  LATICACIDVEN  0.8 1.6    Recent Results (from the past 240 hour(s))  Urine Culture     Status: Abnormal   Collection Time: 06/14/19  9:57 PM   Specimen: Urine, Random  Result Value Ref Range Status   Specimen Description   Final    URINE, RANDOM Performed at [facility] [address], [place], [place] [address]    Special Requests   Final    NONE Performed at [facility], [place] [address] [place], [place] [address]    Culture >=100,000 COLONIES/mL PROTEUS MIRABILIS (A)  Final  Report Status 06/17/2019 FINAL  Final   Organism ID, Bacteria PROTEUS MIRABILIS (A)  Final      Susceptibility   Proteus mirabilis - MIC*    AMPICILLIN <=2 SENSITIVE Sensitive     CEFAZOLIN <=4 SENSITIVE Sensitive     CEFTRIAXONE <=1 SENSITIVE Sensitive     CIPROFLOXACIN <=0.25 SENSITIVE Sensitive     GENTAMICIN <=1 SENSITIVE Sensitive     IMIPENEM 1 SENSITIVE Sensitive     NITROFURANTOIN 256 RESISTANT Resistant     TRIMETH/SULFA <=20 SENSITIVE Sensitive     AMPICILLIN/SULBACTAM <=2 SENSITIVE Sensitive     * >=100,000 COLONIES/mL PROTEUS MIRABILIS  Respiratory Panel by RT PCR (Flu A&B, Covid) - Nasopharyngeal Swab     Status: Abnormal   Collection Time: 06/14/19 11:11 PM   Specimen: Nasopharyngeal Swab  Result Value Ref Range Status   SARS Coronavirus 2 by RT PCR POSITIVE (A) NEGATIVE Final    Comment: RESULT CALLED TO, READ BACK BY AND VERIFIED WITH: BULLOCK,A @ [id number] ON [id number] BY POTEAT,S (NOTE) SARS-CoV-2 target nucleic acids are DETECTED. SARS-CoV-2 RNA is generally detectable in upper respiratory specimens  during the acute phase of infection. Positive results are indicative of the presence of the identified virus, but do not rule out bacterial infection or co-infection with other pathogens not detected by the test. Clinical correlation with patient history and other diagnostic information is necessary to determine patient infection status. The expected result is  Negative. Fact Sheet for Patients:  PinkCheek.be Fact Sheet for Healthcare Providers: GravelBags.it This test is not yet approved or cleared by the [place] FDA and  has been authorized for detection and/or diagnosis of SARS-CoV-2 by FDA under an Emergency Use Authorization (EUA).  This EUA will remain in effect (meaning this test can be used ) for the duration of  the COVID-19 declaration under Section 564(b)(1) of the Act, 21 U.S.C. section 360bbb-3(b)(1), unless the authorization is terminated or revoked sooner.    Influenza A by PCR NEGATIVE NEGATIVE Final   Influenza B by PCR NEGATIVE NEGATIVE Final    Comment: (NOTE) The Xpert Xpress SARS-CoV-2/FLU/RSV assay is intended as an aid in  the diagnosis of influenza from Nasopharyngeal swab specimens and  should not be used as a sole basis for treatment. Nasal washings and  aspirates are unacceptable for Xpert Xpress SARS-CoV-2/FLU/RSV  testing. Fact Sheet for Patients: PinkCheek.be Fact Sheet for Healthcare Providers: GravelBags.it This test is not yet approved or cleared by the [place] FDA and  has been authorized for detection and/or diagnosis of SARS-CoV-2 by  FDA under an Emergency Use Authorization (EUA). This EUA will remain  in effect (meaning this test can be used) for the duration of the  Covid-19 declaration under Section 564(b)(1) of the Act, 21  U.S.C. section 360bbb-3(b)(1), unless the authorization is  terminated or revoked. Performed at [facility], [place] [address] [place], [place] [address]   Culture, blood (routine x 2)     Status: None (Preliminary result)   Collection Time: 06/15/19  2:25 AM   Specimen: BLOOD  Result Value Ref Range Status   Specimen Description   Final    BLOOD LEFT ANTECUBITAL Performed at [facility] [address]  [place], [place] [address]    Special Requests   Final    BOTTLES DRAWN AEROBIC AND ANAEROBIC Blood Culture adequate volume Performed at [facility] [address], [place], [place] [address]    Culture   Final  NO GROWTH 3 DAYS Performed at [facility], [place] [address] [place], [place] [address]    Report Status PENDING  Incomplete  Culture, blood (routine x 2)     Status: None (Preliminary result)   Collection Time: 06/15/19  2:30 AM   Specimen: BLOOD  Result Value Ref Range Status   Specimen Description   Final    BLOOD RIGHT ANTECUBITAL Performed at [facility] [address], [place], [place] [address]    Special Requests   Final    BOTTLES DRAWN AEROBIC AND ANAEROBIC Blood Culture results may not be optimal due to an inadequate volume of blood received in culture bottles Performed at [facility] [address] [place], [place] [address]    Culture   Final    NO GROWTH 3 DAYS Performed at [facility], [place] [address], [place], [place] [address]    Report Status PENDING  Incomplete     Radiology Studies: No results found.  Scheduled Meds: . amLODipine  5 mg Oral Daily  . buPROPion  150 mg Oral QHS  . dexamethasone (DECADRON) injection  6 mg Intravenous Daily  . memantine  28 mg Oral QHS   And  . donepezil  10 mg Oral QHS  . enoxaparin (LOVENOX) injection  40 mg Subcutaneous Q24H  . feeding supplement (ENSURE ENLIVE)  237 mL Oral BID BM  . levothyroxine  88 mcg Oral Q0600  . venlafaxine XR  75 mg Oral Q breakfast   Continuous Infusions: . sodium chloride 250 mL (06/18/19 0924)  .  ceFAZolin (ANCEF) IV 1 g (06/18/19 0926)  . remdesivir 100 mg in NS 100 mL 100 mg (06/18/19 1135)     LOS: 3 days    Time spent: 25 mins.   Shawna Clamp, MD [facility]   If 7PM-7AM, please contact night-coverage

## 2019-06-18 NOTE — Progress Notes (Signed)
Daughter called into room. Patient given phone and patient woke up to talk to her.

## 2019-06-18 NOTE — Progress Notes (Signed)
Patient noted to be agitated and angry earlier today about not being able to go home or see her family. Paged Dr Dwyane Dee and received order for one time Seroquel. Was able to distract the patient with snacks,  allowing her to sit in the chair and talking to her. Patient sat in the chair for a few hours and is now lying in bed calmly resting. Patient was calmed without needing Seroquel.

## 2019-06-19 DIAGNOSIS — R262 Difficulty in walking, not elsewhere classified: Secondary | ICD-10-CM

## 2019-06-19 DIAGNOSIS — U071 COVID-19: Principal | ICD-10-CM

## 2019-06-19 DIAGNOSIS — N939 Abnormal uterine and vaginal bleeding, unspecified: Secondary | ICD-10-CM

## 2019-06-19 LAB — COMPREHENSIVE METABOLIC PANEL
ALT: 32 U/L (ref 0–44)
AST: 44 U/L — ABNORMAL HIGH (ref 15–41)
Albumin: 3.8 g/dL (ref 3.5–5.0)
Alkaline Phosphatase: 88 U/L (ref 38–126)
Anion gap: 8 (ref 5–15)
BUN: 25 mg/dL — ABNORMAL HIGH (ref 8–23)
CO2: 27 mmol/L (ref 22–32)
Calcium: 8.7 mg/dL — ABNORMAL LOW (ref 8.9–10.3)
Chloride: 105 mmol/L (ref 98–111)
Creatinine, Ser: 0.84 mg/dL (ref 0.44–1.00)
GFR calc Af Amer: >60 mL/min (ref >60)
GFR calc non Af Amer: >60 mL/min (ref >60)
Glucose, Bld: 99 mg/dL (ref 70–99)
Potassium: 3.3 mmol/L — ABNORMAL LOW (ref 3.5–5.1)
Sodium: 140 mmol/L (ref 135–145)
Total Bilirubin: 0.8 mg/dL (ref 0.3–1.2)
Total Protein: 6.7 g/dL (ref 6.5–8.1)

## 2019-06-19 LAB — CBC
HCT: 42.2 % (ref 36.0–46.0)
Hemoglobin: 14.3 g/dL (ref 12.0–15.0)
MCH: 34.2 pg — ABNORMAL HIGH (ref 26.0–34.0)
MCHC: 33.9 g/dL (ref 30.0–36.0)
MCV: 101 fL — ABNORMAL HIGH (ref 80.0–100.0)
Platelets: 216 10*3/uL (ref 150–400)
RBC: 4.18 MIL/uL (ref 3.87–5.11)
RDW: 13 % (ref 11.5–15.5)
WBC: 8.9 10*3/uL (ref 4.0–10.5)
nRBC: 0 % (ref 0.0–0.2)

## 2019-06-19 LAB — D-DIMER, QUANTITATIVE: D-Dimer, Quant: 0.83 ug/mL-FEU — ABNORMAL HIGH (ref 0.00–0.50)

## 2019-06-19 LAB — GLUCOSE, CAPILLARY: Glucose-Capillary: 104 mg/dL — ABNORMAL HIGH (ref 70–99)

## 2019-06-19 LAB — FERRITIN: Ferritin: 74 ng/mL (ref 11–307)

## 2019-06-19 LAB — C-REACTIVE PROTEIN: CRP: 0.6 mg/dL (ref <1.0)

## 2019-06-19 MED ORDER — POTASSIUM CHLORIDE CRYS ER 10 MEQ PO TBCR
10.0000 meq | EXTENDED_RELEASE_TABLET | Freq: Two times a day (BID) | ORAL | Status: DC
  Administered 2019-06-19 (×2): 10 meq via ORAL
  Filled 2019-06-19 (×2): qty 1

## 2019-06-19 MED ORDER — AMLODIPINE BESYLATE 10 MG PO TABS
10.0000 mg | ORAL_TABLET | Freq: Every day | ORAL | Status: DC
  Administered 2019-06-20 – 2019-06-24 (×5): 10 mg via ORAL
  Filled 2019-06-19 (×5): qty 1

## 2019-06-19 NOTE — Progress Notes (Signed)
Lump on patient's left side of her head has decreased from golf ball size to slightly raised lump. Ice has been in place all night.

## 2019-06-19 NOTE — Progress Notes (Signed)
   06/18/19 2128  What Happened  Was fall witnessed? No  Was patient injured? Yes  Patient found on floor  Found by Staff-comment (NT)  Stated prior activity ambulating-unassisted  Follow Up  MD notified Jeannette Corpus, NP  Time MD notified 2130  Additional tests Yes-comment (CT head, EKG, CBG, CBC)  Simple treatment Ice  Progress note created (see row info) Yes  Adult Fall Risk Assessment  Risk Factor Category (scoring not indicated) High fall risk per protocol (document High fall risk)  Age 85  Fall History: Fall within 6 months prior to admission 0  Elimination; Bowel and/or Urine Incontinence 2  Elimination; Bowel and/or Urine Urgency/Frequency 2  Medications: includes PCA/Opiates, Anti-convulsants, Anti-hypertensives, Diuretics, Hypnotics, Laxatives, Sedatives, and Psychotropics 3  Patient Care Equipment 1  Mobility-Assistance 2  Mobility-Gait 2  Mobility-Sensory Deficit 0  Altered awareness of immediate physical environment 1  Impulsiveness 0  Lack of understanding of one's physical/cognitive limitations 4  Total Score 20  Patient Fall Risk Level High fall risk  Adult Fall Risk Interventions  Required Bundle Interventions *See Row Information* High fall risk - low, moderate, and high requirements implemented  Additional Interventions Use of appropriate toileting equipment (bedpan, BSC, etc.);Room near nurses station  Screening for Fall Injury Risk (To be completed on HIGH fall risk patients) - Assessing Need for Low Bed  Risk For Fall Injury- Low Bed Criteria 85 years or older  Will Implement Low Bed and Floor Mats Yes  Vitals  BP (!) 189/107  MAP (mmHg) 130  BP Method Automatic  Pulse Rate (!) 103  Cardiac Rhythm NSR  Oxygen Therapy  SpO2 96 %  O2 Device Room Air  Pain Assessment  Pain Scale PAINAD  PAINAD (Pain Assessment in Advanced Dementia)  Breathing 0  Negative Vocalization 1  Facial Expression 0  Body Language 0  Consolability 0  PAINAD Score 1   Neurological  Neuro (WDL) X  Level of Consciousness Responds to Pain  Orientation Level Oriented to person  Cognition Appropriate at baseline;Memory impairment  Speech Clear  Pupil Assessment  Yes  R Pupil Size (mm) 3  R Pupil Shape Round  R Pupil Reaction Brisk  L Pupil Size (mm) 3  L Pupil Shape Round  L Pupil Reaction Brisk  Neuro Symptoms Forgetful  Glasgow Coma Scale  Eye Opening 4  Best Verbal Response (NON-intubated) 5  Best Motor Response 6  Glasgow Coma Scale Score 15  Musculoskeletal  Musculoskeletal (WDL) X  Assistive Device None  Generalized Weakness Yes  Weight Bearing Restrictions No  Integumentary  Integumentary (WDL) X  Skin Color Appropriate for ethnicity  Skin Condition Dry  Skin Integrity Abrasion;Ecchymosis;Other (Comment) (Golf ball sized lump on L side of head d/t fall)  Abrasion Location Back;Leg  Abrasion Location Orientation Medial  Abrasion Intervention Other (Comment) (assessed)  Ecchymosis Location Leg;Arm  Ecchymosis Location Orientation Bilateral  Ecchymosis Intervention Other (Comment) (assessed)   X. Blount, NP notified, Colvin Caroli, RN (rapid response) notified, Family notified at 0000. CT head obtained, EKG obtained (results in chart), CBG obtained (results in chart), Vitals taken (see flowsheet), Neuro assessment completed. Patient has a golf ball size lump on the left side of her head, ice applied and tylenol given for pain.

## 2019-06-19 NOTE — Progress Notes (Signed)
Patient removed IV in R forearm, removed IV in left AC and  kerlix that was covering the IV. Patient removed DNR arm band and Blood bank armband. Teeth marks on Fall armband. RN attempted to insert an IV, patient became aggressive and yelled "you aint stickin me no more. My blood is same as other blood, get blood from you. You ain't stickin me." RN explained the importance of an IV. Patient states she doesn't need any medicine or an IV.   RN notified Jeannette Corpus, NP of the above information.

## 2019-06-19 NOTE — Progress Notes (Addendum)
Rapid Response Event Note  Overview:  Called at 2122, arrived at 2129 d/t pt has fallen.        Initial Focused Assessment: as I entered pt room, pt is lying on the floor surrounded by staff.  Pt is able to follow commands and answer simple questions.  Pt baseline orientation is to self.  Pt has a large hematoma on the left back side of her head.  She is complaining of a bad headache.  No other bruises noted.    Interventions: Pt placed on monitor, see VS flow sheet.  CT scan and neuro checks. [facility], NP informed.   Plan of Care (if not transferred):  Pt will stay in current room, bedside RN continue neuro checks and closely monitor patient. Continue any orders per [facility], NP also.  Please give [facility] a call if needed.  Dyann Ruddle

## 2019-06-19 NOTE — Plan of Care (Signed)
  Problem: Education: Goal: Knowledge of General Education information will improve Description: Including pain rating scale, medication(s)/side effects and non-pharmacologic comfort measures Outcome: Progressing Note: Patient Oriented x1. Will continue to re-educate   Problem: Health Behavior/Discharge Planning: Goal: Ability to manage health-related needs will improve Outcome: Progressing Note: Patient Oriented x1. Will continue to re-educate   Problem: Clinical Measurements: Goal: Respiratory complications will improve Outcome: Progressing Note: Patient Oriented x1. Will continue to re-educate Goal: Cardiovascular complication will be avoided Outcome: Progressing Note: Patient Oriented x1. Will continue to re-educate   Problem: Pain Managment: Goal: General experience of comfort will improve Outcome: Progressing Note: Patient Oriented x1. Will continue to re-educate   Problem: Skin Integrity: Goal: Risk for impaired skin integrity will decrease Outcome: Progressing Note: Patient Oriented x1. Will continue to re-educate

## 2019-06-19 NOTE — Progress Notes (Signed)
PROGRESS NOTE    [name]  [id number] DOB: 1931/11/18 DOA: 06/14/2019 PCP: [name], NP    Brief Narrative:  This 86 year old female with significant medical history of Alzheimer's dementia GERD, depression, hypertension, hyperlipidemia, vitamin D deficiency presented to the ED with her daughter due to concerns of rapid onset of weakness and difficulty with ambulation. Patient is a poor historian, history was obtained from patient's family via phone conversation on admission. As per admitting- daughter explains that as recently as this past Sunday the patient was walking down the street using her walker which is her baseline level of activity. Daughter explains that by Monday morning shortly after breakfast the patient suddenly began to exhibit significant difficulty with ambulation, requiring a two-person assist. The daughter denies that the patient has developed focal weakness of one side of the body. The daughter also does not endorse slurring of words, changes in vision or difficulty with tolerating oral intake. The daughter does state that the patient has had a dry nonproductive cough for approximately 3 days but denies any recent episodes of fever, nausea, vomiting, complaints of dysuria, pain or diarrhea. There have been no recent sick contacts.  Of note, patient has received 1 dose of her COVID-19 vaccine with the second dose due on May 12. The daughter does not recall which brand of vaccination she is receiving.  Patient was evaluated at her primary care provider\'s office byDina Ngetich NPearlier in the day on 4/27 which the patient was advised to go to [facility] emergency department for further evaluation. In the ED, CT head:  no acute findings, suspected to have UTI and further work-up found out,  she was positive for Covid. Patient\'s daughter interested in MRI and was ordered.   Assessment & Plan:   Principal Problem:   Complicated UTI (urinary  tract infection) Active Problems:   Hypothyroidism   Essential hypertension   Generalized weakness   Alzheimer\'s dementia with behavioral disturbance (HCC)   COVID-19 virus infection  Rapidly worsening weakness/motor dysfunction,difficult ambulation:Suspecting multifactorialsecondary to UTI, COVID-19 infection.CT head:  no acute findings.B12/Folate-NL,TSHlow-0 check FT4.There was question of possible left-sided weakness on admission-MRI brain was ordered "Motion degraded study Moderate to advanced atrophy with extensive chronic microvascular ischemia No acute infarct,  1 cm calcific meningioma in the left posterior fossa appears unchanged from 2013".WillContinue on antibiotics, placed on Decadron /remdesivir for Covid infection and also continue PT OT.  Complicated UTI:UA >50, many bacteria, leukocytes large.urine culture more than 100,000 Proteus, continue ceftriaxone follow-up culture. Blood culture no growth so far.   COVID-19 virus infection:X-ray is unremarkable and she is not hypoxic.Inflammatory markers are relatively stable to improving. Patient has a significant weakness which could be contributed by her COVID-19 infectionas well. ContremdesivirDay # 5/5and Decadronday #5/10(spot <94 on RA).   COVID-19 Labs     04 /29/21 0109 06/17/19 0725 06/18/19 06/19/19 0418  DDIMER 0.62* 0.70* 0.59 0.83  FERRITIN 63 66 67 74  CRP 1.3* 0.7 0.6 0.6    Recent Labs       Lab Results  Component Value Date   SARSCOV2NAA POSITIVE (A) 06/14/2019   [place] NEGATIVE 08/25/2018     Hypokalemia:Today is 3.3 - repleted  Metabolic acidosis-Resolved.  bicarb 23. encouarged pohydration.  Dehydration - Resolved. BUN/creatinine up at 20/1.1 admission now at 14/0.6. Encourage po, nutrition eval  Hypothyroidism:Continue home Synthroid. TSH normal in the past years, her low- check FT4.  Essential hypertension: Pressure is not well controlled, increased home  amlodipine.  Alzheimer's dementia with behavioral disturbance:continue Namenda, Aricept and venlafaxine and bupropion.On admission needed to be placed on mitten. Continue supportive care. Encourage ambulation PT OTeval.    DVT prophylaxis: Lovenox SQ Code Status: DNR  Family Communication: None today--awaiting SNF Disposition Plan: SNF   The treatment plan and use of medications and known side effects were discussed with patient/family, they were clearly explained that there is no proven definitive treatment for COVID-19 infection, any medications used here are based on published clinical articles/anecdotal data which are not peer-reviewed or randomized control trials. Complete risks and long-term side effects are unknown, however in the best clinical judgment they seem to be of some clinical benefit rather than medical risks. Patient/family agree with the treatment plan and want to receive the given medications.  Status is: Inpatient The patient will require care spanning > 2 midnights and should be moved to inpatient because:Inpatient level of care appropriate due to severity of illness, for management of UTI, covid virus infection.  Dispo: The patient is from: Home Anticipated d/c is [id number] vs SNF. Obtain PT OT evaluation Anticipated d/c date is: > 3 days Patient currentlyis not medically stable to d/c. Consultants:   None  Procedures:  None   Antimicrobials: Anti-infectives (From admission, onward)   Start     Dose/Rate Route Frequency Ordered Stop   06/17/19 2200  ceFAZolin (ANCEF) IVPB 1 g/50 mL premix     1 g 100 mL/hr over 30 Minutes Intravenous Every 12 hours 06/17/19 1231     06/16/19 1000  remdesivir 100 mg in sodium chloride 0.9 % 100 mL IVPB     100 mg 200 mL/hr over 30 Minutes Intravenous Daily 06/15/19 1422 06/20/19 0959   06/15/19 2300  cefTRIAXone (ROCEPHIN) 1 g in sodium chloride 0.9 % 100 mL IVPB  Status:   Discontinued     1 g 200 mL/hr over 30 Minutes Intravenous Every 24 hours 06/15/19 0248 06/17/19 1231   06/15/19 1530  remdesivir 100 mg in sodium chloride 0.9 % 100 mL IVPB     100 mg 200 mL/hr over 30 Minutes Intravenous  Once 06/15/19 1422 06/15/19 1720   06/15/19 1430  remdesivir 100 mg in sodium chloride 0.9 % 100 mL IVPB     100 mg 200 mL/hr over 30 Minutes Intravenous  Once 06/15/19 1422 06/15/19 1719   06/14/19 2330  fosfomycin (MONUROL) packet 3 g  Status:  Discontinued     3 g Oral  Once 06/14/19 2247 06/14/19 2310   06/14/19 2315  cefTRIAXone (ROCEPHIN) 1 g in sodium chloride 0.9 % 100 mL IVPB     1 g 200 mL/hr over 30 Minutes Intravenous  Once 06/14/19 2310 06/15/19 0101       Subjective: S/p fall overnight with negative head CT--no acute issues. Limited po intake.  Objective: Vitals:   06/18/19 2141 06/18/19 2225 06/19/19 0145 06/19/19 0636  BP: (!) 169/106 (!) 162/92 (!) 150/85 (!) 154/97  Pulse: 91 83 84 66  Resp:  14 20 17   Temp:  (!) 97.4 F (36.3 C) 97.6 F (36.4 C) 97.8 F (36.6 C)  TempSrc:  Oral Oral Oral  SpO2: 94% 98% 94% 98%  Weight:      Height:        Intake/Output Summary (Last 24 hours) at 06/19/2019 0950 Last data filed at 06/19/2019 0200 Gross per 24 hour  Intake 455.94 ml  Output 400 ml  Net 55.94 ml   Filed Weights   06/17/19 1712  Weight: 55.9  kg    Examination:  General exam: Appears calm and comfortable  Respiratory system: Clear to auscultation. Respiratory effort normal. Cardiovascular system: S1 & S2 heard, RRR.  Gastrointestinal system: Abdomen is nondistended, soft and nontender.  Central nervous system: Alert and confused. No focal neurological deficits. Extremities: Symmetric  Skin: No rashes  Data Reviewed: I have personally reviewed following labs and imaging studies  CBC: Recent Labs  Lab 06/14/19 1831 06/14/19 1831 06/16/19 0109 06/17/19 0725 06/18/19 0418 06/18/19 2314 06/19/19 0544  WBC 6.2   < > 3.4*  7.2 7.0 7.1 8.9  NEUTROABS 3.7  --  2.6  --   --   --   --   HGB 13.5   < > 13.8 13.4 13.7 13.7 14.3  HCT 41.3   < > 42.3 38.8 40.9 41.9 42.2  MCV 104.0*   < > 102.9* 100.3* 99.8 102.9* 101.0*  PLT 194   < > 192 207 215 235 216   < > = values in this interval not displayed.   Basic Metabolic Panel: Recent Labs  Lab 06/15/19 1452 06/16/19 0109 06/17/19 0725 06/18/19 0418 06/19/19 0544  NA 138 138 138 142 140  K 3.2* 3.1* 3.2* 3.6 3.3*  CL 104 104 105 108 105  CO2 25 21* 23 25 27   GLUCOSE 95 121* 105* 108* 99  BUN 12 14 26* 29* 25*  CREATININE 0.75 0.66 0.90 0.88 0.84  CALCIUM 8.3* 8.4* 8.4* 8.6* 8.7*  MG  --  1.8  --   --   --    GFR: Estimated Creatinine Clearance: 41.5 mL/min (by C-G formula based on SCr of 0.84 mg/dL). Liver Function Tests: Recent Labs  Lab 06/14/19 1831 06/16/19 0109 06/17/19 0725 06/18/19 0418 06/19/19 0544  AST 20 23 25 31  44*  ALT 17 16 16 22  32  ALKPHOS 106 90 85 85 88  BILITOT 0.4 0.5 0.7 0.6 0.8  PROT 7.2 6.5 6.1* 6.5 6.7  ALBUMIN 3.8 3.4* 3.4* 3.7 3.8   CBG: Recent Labs  Lab 06/18/19 2227  GLUCAP 104*   Anemia Panel: Recent Labs    06/18/19 0418 06/19/19 0544  FERRITIN 67 74   Sepsis Labs: Recent Labs  Lab 06/15/19 0225 06/15/19 0412  LATICACIDVEN 0.8 1.6    Recent Results (from the past 240 hour(s))  Urine Culture     Status: Abnormal   Collection Time: 06/14/19  9:57 PM   Specimen: Urine, Random  Result Value Ref Range Status   Specimen Description   Final    URINE, RANDOM Performed at [facility], [place] [address] [place], [place] [address]    Special Requests   Final    NONE Performed at [facility], [place] [address], [place], [place] [address]    Culture >=100,000 COLONIES/mL PROTEUS MIRABILIS (A)  Final   Report Status 06/17/2019 FINAL  Final   Organism ID, Bacteria PROTEUS MIRABILIS (A)  Final      Susceptibility   Proteus mirabilis - MIC*    AMPICILLIN <=2  SENSITIVE Sensitive     CEFAZOLIN <=4 SENSITIVE Sensitive     CEFTRIAXONE <=1 SENSITIVE Sensitive     CIPROFLOXACIN <=0.25 SENSITIVE Sensitive     GENTAMICIN <=1 SENSITIVE Sensitive     IMIPENEM 1 SENSITIVE Sensitive     NITROFURANTOIN 256 RESISTANT Resistant     TRIMETH/SULFA <=20 SENSITIVE Sensitive     AMPICILLIN/SULBACTAM <=2 SENSITIVE Sensitive     * >=100,000 COLONIES/mL PROTEUS MIRABILIS  Respiratory  Panel by RT PCR (Flu A&B, Covid) - Nasopharyngeal Swab     Status: Abnormal   Collection Time: 06/14/19 11:11 PM   Specimen: Nasopharyngeal Swab  Result Value Ref Range Status   SARS Coronavirus 2 by RT PCR POSITIVE (A) NEGATIVE Final    Comment: RESULT CALLED TO, READ BACK BY AND VERIFIED WITH: BULLOCK,A @ [id number] ON [id number] BY POTEAT,S (NOTE) SARS-CoV-2 target nucleic acids are DETECTED. SARS-CoV-2 RNA is generally detectable in upper respiratory specimens  during the acute phase of infection. Positive results are indicative of the presence of the identified virus, but do not rule out bacterial infection or co-infection with other pathogens not detected by the test. Clinical correlation with patient history and other diagnostic information is necessary to determine patient infection status. The expected result is Negative. Fact Sheet for Patients:  PinkCheek.be Fact Sheet for Healthcare Providers: GravelBags.it This test is not yet approved or cleared by the [place] FDA and  has been authorized for detection and/or diagnosis of SARS-CoV-2 by FDA under an Emergency Use Authorization (EUA).  This EUA will remain in effect (meaning this test can be used ) for the duration of  the COVID-19 declaration under Section 564(b)(1) of the Act, 21 U.S.C. section 360bbb-3(b)(1), unless the authorization is terminated or revoked sooner.    Influenza A by PCR NEGATIVE NEGATIVE Final   Influenza B by PCR NEGATIVE NEGATIVE Final     Comment: (NOTE) The Xpert Xpress SARS-CoV-2/FLU/RSV assay is intended as an aid in  the diagnosis of influenza from Nasopharyngeal swab specimens and  should not be used as a sole basis for treatment. Nasal washings and  aspirates are unacceptable for Xpert Xpress SARS-CoV-2/FLU/RSV  testing. Fact Sheet for Patients: PinkCheek.be Fact Sheet for Healthcare Providers: GravelBags.it This test is not yet approved or cleared by the [place] FDA and  has been authorized for detection and/or diagnosis of SARS-CoV-2 by  FDA under an Emergency Use Authorization (EUA). This EUA will remain  in effect (meaning this test can be used) for the duration of the  Covid-19 declaration under Section 564(b)(1) of the Act, 21  U.S.C. section 360bbb-3(b)(1), unless the authorization is  terminated or revoked. Performed at [facility], [place] [address], [place], [place] [address]   Culture, blood (routine x 2)     Status: None (Preliminary result)   Collection Time: 06/15/19  2:25 AM   Specimen: BLOOD  Result Value Ref Range Status   Specimen Description   Final    BLOOD LEFT ANTECUBITAL Performed at [place] [address], [place], [place] [address]    Special Requests   Final    BOTTLES DRAWN AEROBIC AND ANAEROBIC Blood Culture adequate volume Performed at [place] [address], [place], [place] [address]    Culture   Final    NO GROWTH 4 DAYS Performed at [facility], [place] [address], [place], [place] [address]    Report Status PENDING  Incomplete  Culture, blood (routine x 2)     Status: None (Preliminary result)   Collection Time: 06/15/19  2:30 AM   Specimen: BLOOD  Result Value Ref Range Status   Specimen Description   Final    BLOOD RIGHT ANTECUBITAL Performed at [place] [address] [place], [place] [address]    Special  Requests   Final    BOTTLES DRAWN AEROBIC AND ANAEROBIC Blood Culture results may not be optimal due to  an inadequate volume of blood received in culture bottles Performed at [place] [address] [place], [place] [address]    Culture   Final    NO GROWTH 4 DAYS Performed at [facility], [place] [address], [place], [place] [address]    Report Status PENDING  Incomplete      Radiology Studies: CT HEAD WO CONTRAST  Result Date: 06/18/2019 CLINICAL DATA:  Fall today.  Head injury EXAM: CT HEAD WITHOUT CONTRAST TECHNIQUE: Contiguous axial images were obtained from the base of the skull through the vertex without intravenous contrast. COMPARISON:  CT head 06/14/2019 FINDINGS: Brain: Moderate to advanced atrophy. Extensive chronic microvascular ischemic change in the white matter. Negative for acute infarct or hemorrhage. 1 cm calcific meningioma in the left posterior fossa unchanged. Vascular: Negative for hyperdense vessel Skull: Negative for skull fracture. Moderately large left parietal scalp hematoma. Sinuses/Orbits: Paranasal sinuses clear. Bilateral cataract extraction. Other: None IMPRESSION: No acute intracranial abnormality. Prominent left parietal scalp hematoma. Electronically Signed   By: Franchot Gallo M.D.   On: 06/18/2019 22:16     Scheduled Meds: . amLODipine  5 mg Oral Daily  . buPROPion  150 mg Oral QHS  . dexamethasone (DECADRON) injection  6 mg Intravenous Daily  . memantine  28 mg Oral QHS   And  . donepezil  10 mg Oral QHS  . enoxaparin (LOVENOX) injection  40 mg Subcutaneous Q24H  . feeding supplement (ENSURE ENLIVE)  237 mL Oral BID BM  . levothyroxine  88 mcg Oral Q0600  . QUEtiapine  25 mg Oral Once  . venlafaxine XR  75 mg Oral Q breakfast   Continuous Infusions: . sodium chloride Stopped (06/18/19 1312)  .  ceFAZolin (ANCEF) IV 1 g (06/18/19 2318)  . remdesivir 100 mg in NS 100 mL Stopped (06/18/19 1205)     LOS: 4 days     Donnamae Jude, MD 06/19/2019 9:50 AM [phone] [facility] If 7PM-7AM, please contact night-coverage 06/19/2019, 9:50 AM

## 2019-06-19 NOTE — Progress Notes (Signed)
Patient have been confused, getting ou of bed, pulling lines out, and not redirectable, wanting to go home. Ambulated patient in the hall and up in the chair, still trying to get up, requiring staff frequency in the room. Patient's daughter updated. Dr. Kennon Rounds notified, order given for sitter, charge nurse and A/C informed.

## 2019-06-20 LAB — T4, FREE: Free T4: 1.4 ng/dL — ABNORMAL HIGH (ref 0.61–1.12)

## 2019-06-20 LAB — CULTURE, BLOOD (ROUTINE X 2)
Culture: NO GROWTH
Culture: NO GROWTH
Special Requests: ADEQUATE

## 2019-06-20 LAB — D-DIMER, QUANTITATIVE: D-Dimer, Quant: 0.58 ug/mL-FEU — ABNORMAL HIGH (ref 0.00–0.50)

## 2019-06-20 LAB — FERRITIN: Ferritin: 72 ng/mL (ref 11–307)

## 2019-06-20 LAB — C-REACTIVE PROTEIN: CRP: 0.5 mg/dL (ref <1.0)

## 2019-06-20 MED ORDER — POTASSIUM CHLORIDE CRYS ER 20 MEQ PO TBCR
40.0000 meq | EXTENDED_RELEASE_TABLET | Freq: Once | ORAL | Status: AC
  Administered 2019-06-20: 40 meq via ORAL
  Filled 2019-06-20: qty 2

## 2019-06-20 MED ORDER — LORAZEPAM 2 MG/ML IJ SOLN
1.0000 mg | Freq: Once | INTRAMUSCULAR | Status: AC
  Administered 2019-06-20: 1 mg via INTRAVENOUS
  Filled 2019-06-20: qty 1

## 2019-06-20 NOTE — Plan of Care (Addendum)
Patient is determined to climb from the bed.  Has been swinging on the sitter and bit the tag off her mitts.  Dr. Informed and requested PRN to assist.

## 2019-06-20 NOTE — TOC Progression Note (Addendum)
Transition of Care [facility]) - Progression Note    Patient Details  Name: [name] MRN: [id number] Date of Birth: 1931/04/29  Transition of Care [facility]) CM/SW [place], [place] Phone Number: 06/20/2019, 8:45 AM  Clinical Narrative:     CSW received call from daughter [name] [name]. Daughter expresses preference for [place] after research of facilities. Daughter also requests to be updated about time of transition and where to bring her mothers belongings for transition. CSW sent request to [facility]   1600: Leslie Dales from [facility] to follow up on request. She will call back with more info. Also called daughter [name] to notify that still waiting on response from [facility].   Expected Discharge Plan: Skilled Nursing Facility Barriers to Discharge: Continued Medical Work up  Expected Discharge Plan and Services Expected Discharge Plan: [place]                                               Social Determinants of Health (SDOH) Interventions    Readmission Risk Interventions No flowsheet data found.

## 2019-06-20 NOTE — Progress Notes (Signed)
PROGRESS NOTE    [name]  [id number] DOB: 01-14-1932 DOA: 06/14/2019 PCP: [name], NP   Brief Narrative: This 86 year old female with significant medical history of Alzheimer's dementia GERD, depression, hypertension, hyperlipidemia, vitamin D deficiency presented to the ED with her daughter due to concerns of rapid onset of weakness and difficulty with ambulation.Patient has received 1 dose of her COVID-19 vaccine .Patient was evaluated at her primary care provider's office byDina Ngetich NPearlier in the day on 4/27 and  the patient was advised to go to [facility] emergency department for further evaluation. In the ED, CT head: no acute findings, suspected to have UTI and further work-up initiated.She was also found to be  positive for Covid.  Now waiting for placement.   Assessment & Plan:   Principal Problem:   Complicated UTI (urinary tract infection) Active Problems:   Hypothyroidism   Essential hypertension   Generalized weakness   Alzheimer's dementia with behavioral disturbance ([place])   COVID-19 virus infection   Rapidly worsening weakness/motor dysfunction,difficult ambulation:Suspecting multifactorialsecondary to UTI, COVID-19 infection.CT head did not show any acute intracranial normalities.  MRI brain showed moderate to advanced atrophy with extensive microvascular ischemia, no acute infarct, 1 cm calcified meningioma in the left posterior fossa which is unchanged from last imaging. Patient seen by OT and PT had recommended skilled nursing facility.  Complicated UTI:UA [id number], many bacteria, leukocytes large.urine culture more than 100,000 Proteus, currently on cefazolin. Blood culture no growth so far.   COVID-19 virus infection:X-ray is unremarkable and she is not hypoxic.Inflammatory markers are relatively stable to improving. Patient has a significant weakness which could be contributed by her COVID-19 infectionas well. She completed  remdesivirand she is on Decadronday #6/10.  Hypokalemia: Supplemented with potassium.  Hypothyroidism:She is on Synthyroid at home.  Currently her TSH is low and her T4 is high so Synthyroid discontinued.  Essential hypertension: Pressure is  well controlled,continue current meds.   Alzheimer's dementia with behavioral disturbance:continue Namenda, Aricept and venlafaxine and bupropion.On admission needed to be placed on mitten. Continue supportive care.   Nutrition Problem: Inadequate oral intake Etiology: acute illness, chronic illness(complicated UTI, [id number] virus infection; Alzheimer's dementia)      DVT prophylaxis:Lovenox Code Status: DNR Family Communication: Called daughter on phone on 06/20/19 Status is: Inpatient  Remains inpatient appropriate because:Unsafe d/c plan   Dispo: The patient is from: Home              Anticipated d/c is to: SNF              Anticipated d/c date is: 2 days              Patient currently is medically stable to d/c.   Consultants: None  Procedures:None  Antimicrobials:  Anti-infectives (From admission, onward)   Start     Dose/Rate Route Frequency Ordered Stop   06/17/19 2200  ceFAZolin (ANCEF) IVPB 1 g/50 mL premix     1 g 100 mL/hr over 30 Minutes Intravenous Every 12 hours 06/17/19 1231     06/16/19 1000  remdesivir 100 mg in sodium chloride 0.9 % 100 mL IVPB     100 mg 200 mL/hr over 30 Minutes Intravenous Daily 06/15/19 1422 06/19/19 1030   06/15/19 2300  cefTRIAXone (ROCEPHIN) 1 g in sodium chloride 0.9 % 100 mL IVPB  Status:  Discontinued     1 g 200 mL/hr over 30 Minutes Intravenous Every 24 hours 06/15/19 0248 06/17/19 1231   06/15/19 1530  remdesivir 100 mg in sodium chloride 0.9 % 100 mL IVPB     100 mg 200 mL/hr over 30 Minutes Intravenous  Once 06/15/19 1422 06/15/19 1720   06/15/19 1430  remdesivir 100 mg in sodium chloride 0.9 % 100 mL IVPB     100 mg 200 mL/hr over 30 Minutes Intravenous  Once 06/15/19  1422 06/15/19 1719   06/14/19 2330  fosfomycin (MONUROL) packet 3 g  Status:  Discontinued     3 g Oral  Once 06/14/19 2247 06/14/19 2310   06/14/19 2315  cefTRIAXone (ROCEPHIN) 1 g in sodium chloride 0.9 % 100 mL IVPB     1 g 200 mL/hr over 30 Minutes Intravenous  Once 06/14/19 2310 06/15/19 0101      Subjective: Patient seen and examined at the bedside this morning.  Hemodynamically stable.  Lying on the bed.  Oriented to place but not time.  Denies any complaints of shortness of breath or chest pain.  Objective: Vitals:   06/19/19 0636 06/19/19 0956 06/19/19 1327 06/20/19 0814  BP: (!) 154/97 (!) 139/97 (!) 142/85 138/88  Pulse: 66  97 94  Resp: 17  18 18   Temp: 97.8 F (36.6 C)  97.7 F (36.5 C) 98.4 F (36.9 C)  TempSrc: Oral  Oral Axillary  SpO2: 98%  (!) 89% 92%  Weight:      Height:        Intake/Output Summary (Last 24 hours) at 06/20/2019 1141 Last data filed at 06/20/2019 1100 Gross per 24 hour  Intake 610 ml  Output --  Net 610 ml   Filed Weights   06/17/19 1712  Weight: 55.9 kg    Examination:  General exam: Appears calm and comfortable ,Not in distress,elderly demented lady HEENT:PERRL,Oral mucosa moist, Ear/Nose normal on gross exam Respiratory system: Bilateral equal air entry, normal vesicular breath sounds, no wheezes or crackles  Cardiovascular system: S1 & S2 heard, RRR. No JVD, murmurs, rubs, gallops or clicks. No pedal edema. Gastrointestinal system: Abdomen is nondistended, soft and nontender. No organomegaly or masses felt. Normal bowel sounds heard. Central nervous system: Alert and oriented. No focal neurological deficits. Extremities: No edema, no clubbing ,no cyanosis, distal peripheral pulses palpable. Skin: No rashes, lesions or ulcers,no icterus ,no pallor    Data Reviewed: I have personally reviewed following labs and imaging studies  CBC: Recent Labs  Lab 06/14/19 1831 06/14/19 1831 06/16/19 0109 06/17/19 0725 06/18/19 0418  06/18/19 2314 06/19/19 0544  WBC 6.2   < > 3.4* 7.2 7.0 7.1 8.9  NEUTROABS 3.7  --  2.6  --   --   --   --   HGB 13.5   < > 13.8 13.4 13.7 13.7 14.3  HCT 41.3   < > 42.3 38.8 40.9 41.9 42.2  MCV 104.0*   < > 102.9* 100.3* 99.8 102.9* 101.0*  PLT 194   < > 192 207 215 235 216   < > = values in this interval not displayed.   Basic Metabolic Panel: Recent Labs  Lab 06/15/19 1452 06/16/19 0109 06/17/19 0725 06/18/19 0418 06/19/19 0544  NA 138 138 138 142 140  K 3.2* 3.1* 3.2* 3.6 3.3*  CL 104 104 105 108 105  CO2 25 21* 23 25 27   GLUCOSE 95 121* 105* 108* 99  BUN 12 14 26* 29* 25*  CREATININE 0.75 0.66 0.90 0.88 0.84  CALCIUM 8.3* 8.4* 8.4* 8.6* 8.7*  MG  --  1.8  --   --   --  GFR: Estimated Creatinine Clearance: 41.5 mL/min (by C-G formula based on SCr of 0.84 mg/dL). Liver Function Tests: Recent Labs  Lab 06/14/19 1831 06/16/19 0109 06/17/19 0725 06/18/19 0418 06/19/19 0544  AST 20 23 25 31  44*  ALT 17 16 16 22  32  ALKPHOS 106 90 85 85 88  BILITOT 0.4 0.5 0.7 0.6 0.8  PROT 7.2 6.5 6.1* 6.5 6.7  ALBUMIN 3.8 3.4* 3.4* 3.7 3.8   No results for input(s): LIPASE, AMYLASE in the last 168 hours. No results for input(s): AMMONIA in the last 168 hours. Coagulation Profile: No results for input(s): INR, PROTIME in the last 168 hours. Cardiac Enzymes: No results for input(s): CKTOTAL, CKMB, CKMBINDEX, TROPONINI in the last 168 hours. BNP (last 3 results) No results for input(s): PROBNP in the last 8760 hours. HbA1C: No results for input(s): HGBA1C in the last 72 hours. CBG: Recent Labs  Lab 06/18/19 2227  GLUCAP 104*   Lipid Profile: No results for input(s): CHOL, HDL, LDLCALC, TRIG, CHOLHDL, LDLDIRECT in the last 72 hours. Thyroid Function Tests: Recent Labs    06/20/19 0543  FREET4 1.40*   Anemia Panel: Recent Labs    06/19/19 0544 06/20/19 0543  FERRITIN 74 72   Sepsis Labs: Recent Labs  Lab 06/15/19 0225 06/15/19 0412  LATICACIDVEN 0.8 1.6     Recent Results (from the past 240 hour(s))  Urine Culture     Status: Abnormal   Collection Time: 06/14/19  9:57 PM   Specimen: Urine, Random  Result Value Ref Range Status   Specimen Description   Final    URINE, RANDOM Performed at [facility] [address] [place], [place] [address]    Special Requests   Final    NONE Performed at [facility], [place] [address] [place], [place] [address]    Culture >=100,000 COLONIES/mL PROTEUS MIRABILIS (A)  Final   Report Status 06/17/2019 FINAL  Final   Organism ID, Bacteria PROTEUS MIRABILIS (A)  Final      Susceptibility   Proteus mirabilis - MIC*    AMPICILLIN <=2 SENSITIVE Sensitive     CEFAZOLIN <=4 SENSITIVE Sensitive     CEFTRIAXONE <=1 SENSITIVE Sensitive     CIPROFLOXACIN <=0.25 SENSITIVE Sensitive     GENTAMICIN <=1 SENSITIVE Sensitive     IMIPENEM 1 SENSITIVE Sensitive     NITROFURANTOIN 256 RESISTANT Resistant     TRIMETH/SULFA <=20 SENSITIVE Sensitive     AMPICILLIN/SULBACTAM <=2 SENSITIVE Sensitive     * >=100,000 COLONIES/mL PROTEUS MIRABILIS  Respiratory Panel by RT PCR (Flu A&B, Covid) - Nasopharyngeal Swab     Status: Abnormal   Collection Time: 06/14/19 11:11 PM   Specimen: Nasopharyngeal Swab  Result Value Ref Range Status   SARS Coronavirus 2 by RT PCR POSITIVE (A) NEGATIVE Final    Comment: RESULT CALLED TO, READ BACK BY AND VERIFIED WITH: BULLOCK,A @ [id number] ON [id number] BY POTEAT,S (NOTE) SARS-CoV-2 target nucleic acids are DETECTED. SARS-CoV-2 RNA is generally detectable in upper respiratory specimens  during the acute phase of infection. Positive results are indicative of the presence of the identified virus, but do not rule out bacterial infection or co-infection with other pathogens not detected by the test. Clinical correlation with patient history and other diagnostic information is necessary to determine patient infection status. The expected result is  Negative. Fact Sheet for Patients:  PinkCheek.be Fact Sheet for Healthcare Providers: GravelBags.it This test is not yet approved or cleared by the [place]  FDA and  has been authorized for detection and/or diagnosis of SARS-CoV-2 by FDA under an Emergency Use Authorization (EUA).  This EUA will remain in effect (meaning this test can be used ) for the duration of  the COVID-19 declaration under Section 564(b)(1) of the Act, 21 U.S.C. section 360bbb-3(b)(1), unless the authorization is terminated or revoked sooner.    Influenza A by PCR NEGATIVE NEGATIVE Final   Influenza B by PCR NEGATIVE NEGATIVE Final    Comment: (NOTE) The Xpert Xpress SARS-CoV-2/FLU/RSV assay is intended as an aid in  the diagnosis of influenza from Nasopharyngeal swab specimens and  should not be used as a sole basis for treatment. Nasal washings and  aspirates are unacceptable for Xpert Xpress SARS-CoV-2/FLU/RSV  testing. Fact Sheet for Patients: PinkCheek.be Fact Sheet for Healthcare Providers: GravelBags.it This test is not yet approved or cleared by the [place] FDA and  has been authorized for detection and/or diagnosis of SARS-CoV-2 by  FDA under an Emergency Use Authorization (EUA). This EUA will remain  in effect (meaning this test can be used) for the duration of the  Covid-19 declaration under Section 564(b)(1) of the Act, 21  U.S.C. section 360bbb-3(b)(1), unless the authorization is  terminated or revoked. Performed at [facility], [place] [address] [place], [place] [address]   Culture, blood (routine x 2)     Status: None   Collection Time: 06/15/19  2:25 AM   Specimen: BLOOD  Result Value Ref Range Status   Specimen Description   Final    BLOOD LEFT ANTECUBITAL Performed at [facility] [address] [place], [place] [address]     Special Requests   Final    BOTTLES DRAWN AEROBIC AND ANAEROBIC Blood Culture adequate volume Performed at [facility] [address] [place], [place] [address]    Culture   Final    NO GROWTH 5 DAYS Performed at [facility], [place] [address] [place], [place] [address]    Report Status 06/20/2019 FINAL  Final  Culture, blood (routine x 2)     Status: None   Collection Time: 06/15/19  2:30 AM   Specimen: BLOOD  Result Value Ref Range Status   Specimen Description   Final    BLOOD RIGHT ANTECUBITAL Performed at [facility] [address], [place], [place] [address]    Special Requests   Final    BOTTLES DRAWN AEROBIC AND ANAEROBIC Blood Culture results may not be optimal due to an inadequate volume of blood received in culture bottles Performed at [facility] [address], [place], [place] [address]    Culture   Final    NO GROWTH 5 DAYS Performed at [facility], [place] [address], [place], [place] [address]    Report Status 06/20/2019 FINAL  Final         Radiology Studies: CT HEAD WO CONTRAST  Result Date: 06/18/2019 CLINICAL DATA:  Fall today.  Head injury EXAM: CT HEAD WITHOUT CONTRAST TECHNIQUE: Contiguous axial images were obtained from the base of the skull through the vertex without intravenous contrast. COMPARISON:  CT head 06/14/2019 FINDINGS: Brain: Moderate to advanced atrophy. Extensive chronic microvascular ischemic change in the white matter. Negative for acute infarct or hemorrhage. 1 cm calcific meningioma in the left posterior fossa unchanged. Vascular: Negative for hyperdense vessel Skull: Negative for skull fracture. Moderately large left parietal scalp hematoma. Sinuses/Orbits: Paranasal sinuses clear. Bilateral cataract extraction.  Other: None IMPRESSION: No acute intracranial abnormality. Prominent left parietal scalp hematoma. Electronically Signed   By: Franchot Gallo M.D.   On:  06/18/2019 22:16        Scheduled Meds: . amLODipine  10 mg Oral Daily  . buPROPion  150 mg Oral QHS  . dexamethasone (DECADRON) injection  6 mg Intravenous Daily  . memantine  28 mg Oral QHS   And  . donepezil  10 mg Oral QHS  . enoxaparin (LOVENOX) injection  40 mg Subcutaneous Q24H  . feeding supplement (ENSURE ENLIVE)  237 mL Oral BID BM  . levothyroxine  88 mcg Oral Q0600  . QUEtiapine  25 mg Oral Once  . venlafaxine XR  75 mg Oral Q breakfast   Continuous Infusions: . sodium chloride Stopped (06/18/19 1312)  .  ceFAZolin (ANCEF) IV 1 g (06/20/19 1022)     LOS: 5 days    Time spent: 25 mins,More than 50% of that time was spent in counseling and/or coordination of care.      Shelly Coss, MD [facility] P5/04/2019, 11:41 AM

## 2019-06-20 NOTE — Progress Notes (Signed)
Physical Therapy Treatment Patient Details Name: EMREIGH BOTBYL MRN: [id number] DOB: 25-Jul-1931 Today's Date: 06/20/2019    History of Present Illness 86 year old female with past medical history of Alzheimer's dementia, gastroesophageal reflux disease, depression, hypertension, hyperlipidemia and vitamin D deficiency who presents to [facility] with her daughter due to concerns for rapid onset of weakness and difficulty with ambulation. CT head  no acute findings, MRI pending. Suspected UTI    PT Comments    Pt in recliner chair fully reclined and curled up. General Comments: following all functional commands but loess engaging this session.  Disinterested.  Untouched lunch tray.  Declining to get up. So offered [facility] as an option.   Pt required + 2 assist for safety.  General Gait Details: pt did amb from [facility] to bed approx 4 feet away with walker and + 2 assist for safety.  Pt required increased time.  Slow, unsteady with poor forward flex posture. Assisted back to bed and positioned to comfort.    Follow Up Recommendations  SNF;Supervision/Assistance - 24 hour     Equipment Recommendations  None recommended by PT    Recommendations for Other Services       Precautions / Restrictions Precautions Precautions: Fall Precaution Comments: fell in hospital 5/01 Restrictions Weight Bearing Restrictions: No    Mobility  Bed Mobility Overal bed mobility: Needs Assistance Bed Mobility: Sit to Supine       Sit to supine: HOB elevated;Max assist   General bed mobility comments: Assisted back to bved by supporting B LE.  Pt curled in fetal right  Transfers Overall transfer level: Needs assistance Equipment used: Rolling walker (2 wheeled);None Transfers: Sit to/from [facility] Sit to Stand: Mod assist;Max assist Stand pivot transfers: Max assist       General transfer comment: initaially pt declined to "get up: from recliner so offered [facility] instead.   Required + 2 for safety to rise and complete 1/4 pivot turn.  Required 50% VC's on proper hand transfer.  Required assist with peri care.  Ambulation/Gait Ambulation/Gait assistance: +2 safety/equipment;Min assist Gait Distance (Feet): 4 Feet Assistive device: Rolling walker (2 wheeled) Gait Pattern/deviations: Step-to pattern;Decreased stride length;Shuffle;Trunk flexed Gait velocity: decreased   General Gait Details: pt did amb from [facility] to bed approx 4 feet away with walker and + 2 assist for safety.  Pt required increased time.  Slow, unsteady with poor forward flex posture.   Stairs             Wheelchair Mobility    Modified Rankin (Stroke Patients Only)       Balance                                            Cognition Arousal/Alertness: Awake/alert   Overall Cognitive Status: No family/caregiver present to determine baseline cognitive functioning                                 General Comments: following all functional commands but loess engaging this session.  Disinterested.  Untouched lunch tray.  Declining to get up.      Exercises      General Comments        Pertinent Vitals/Pain Pain Assessment: No/denies pain    Home Living  Prior Function            PT Goals (current goals can now be found in the care plan section) Progress towards PT goals: Progressing toward goals    Frequency    Min 2X/week      PT Plan Current plan remains appropriate    Co-evaluation              AM-PAC PT "6 Clicks" Mobility   Outcome Measure  Help needed turning from your back to your side while in a flat bed without using bedrails?: A Lot Help needed moving from lying on your back to sitting on the side of a flat bed without using bedrails?: A Lot Help needed moving to and from a bed to a chair (including a wheelchair)?: Total Help needed standing up from a chair using your arms  (e.g., wheelchair or bedside chair)?: Total Help needed to walk in hospital room?: Total Help needed climbing 3-5 steps with a railing? : Total 6 Click Score: 8    End of Session Equipment Utilized During Treatment: Gait belt Activity Tolerance: Patient limited by fatigue Patient left: in bed;with call bell/phone within reach Nurse Communication: Mobility status PT Visit Diagnosis: Difficulty in walking, not elsewhere classified (R26.2)     Time: 1455-1520 PT Time Calculation (min) (ACUTE ONLY): 25 min  Charges:  $Gait Training: 8-22 mins $Therapeutic Activity: 8-22 mins                     Rica Koyanagi  PTA Acute  Rehabilitation Services Pager      [phone] Office      [phone]

## 2019-06-20 NOTE — Plan of Care (Signed)

## 2019-06-20 NOTE — Plan of Care (Addendum)
Spoke with daughter Sharyn Lull to inform of update, current POC and status.  Also talked briefly and possible discharge process and current meds and care.  No further questions at time. * Appreciates being "kept in the loop."

## 2019-06-21 LAB — T3, FREE: T3, Free: 1.1 pg/mL — ABNORMAL LOW (ref 2.0–4.4)

## 2019-06-21 MED ORDER — QUETIAPINE FUMARATE 25 MG PO TABS
25.0000 mg | ORAL_TABLET | Freq: Every day | ORAL | Status: DC
  Administered 2019-06-21 – 2019-06-23 (×3): 25 mg via ORAL
  Filled 2019-06-21 (×3): qty 1

## 2019-06-21 NOTE — Care Management Important Message (Signed)
Important Message  Patient Details IM Letter given to Gabriel Earing RN Case Manager to present to the Patient Name: [name] MRN: [id number] Date of Birth: [birth year]   Medicare Important Message Given:  Yes     Kerin Salen 06/21/2019, 11:28 AM

## 2019-06-21 NOTE — Progress Notes (Signed)
PROGRESS NOTE    [name]  [id number] DOB: Sep 07, 1931 DOA: 06/14/2019 PCP: [name], NP   Brief Narrative: This 86 year old female with significant medical history of Alzheimer's dementia GERD, depression, hypertension, hyperlipidemia, vitamin D deficiency presented to the ED with her daughter due to concerns of rapid onset of weakness and difficulty with ambulation.Patient has received 1 dose of her COVID-19 vaccine .Patient was evaluated at her primary care provider's office byDina Ngetich NPearlier in the day on 4/27 and  the patient was advised to go to [facility] emergency department for further evaluation. In the ED, CT head did not show any  acute findings, she was suspected to have UTI and further work-up initiated.She was also found to be  positive for Covid.  Now waiting for placement. Patient is medically stable for discharge to skilled nursing facility as soon as bed is available.   Assessment & Plan:   Principal Problem:   Complicated UTI (urinary tract infection) Active Problems:   Hypothyroidism   Essential hypertension   Generalized weakness   Alzheimer's dementia with behavioral disturbance ([place])   COVID-19 virus infection   Rapidly worsening weakness/motor dysfunction,difficult ambulation:Suspecting multifactorialsecondary to UTI, COVID-19 infection.CT head did not show any acute intracranial normalities.  MRI brain showed moderate to advanced atrophy with extensive microvascular ischemia, no acute infarct, 1 cm calcified meningioma in the left posterior fossa which is unchanged from last imaging. Patient seen by OT and PT had recommended skilled nursing facility.  Complicated UTI:UA [id number], many bacteria, leukocytes large.urine culture more than 100,000 Proteus, she was  on cefazolin. Blood culture no growth so far. She completed Abx course.  COVID-19 virus infection:X-ray is unremarkable and she is not hypoxic.Inflammatory markers are  relatively stable . Patient has a significant weakness which could be contributed by her COVID-19 infectionas well. She completed remdesivirand she is on Decadronday #7/10.  Hypokalemia: Supplemented with potassium.  Hypothyroidism:She is on Synthyroid at home.  Currently her TSH is low and her T4 is high so Synthyroid discontinued.  Essential hypertension: Pressure is  well controlled,continue current meds.   Alzheimer's dementia with behavioral disturbance:continue Namenda, Aricept and venlafaxine and bupropion.On admission needed to be placed on mittens. Continue supportive care. Sitter was initiated due to agitation today.  Will start on low-dose Seroquel.   Nutrition Problem: Inadequate oral intake Etiology: acute illness, chronic illness(complicated UTI, [id number] virus infection; Alzheimer's dementia)      DVT prophylaxis:Lovenox Code Status: DNR Family Communication: Called daughter on phone on 06/20/19 Status is: Inpatient  Remains inpatient appropriate because:Unsafe d/c plan   Dispo: The patient is from: Home              Anticipated d/c is to: SNF              Anticipated d/c date is: As soon as bed is available              Patient currently is medically stable to d/c.   Consultants: None  Procedures:None  Antimicrobials:  Anti-infectives (From admission, onward)   Start     Dose/Rate Route Frequency Ordered Stop   06/17/19 2200  ceFAZolin (ANCEF) IVPB 1 g/50 mL premix     1 g 100 mL/hr over 30 Minutes Intravenous Every 12 hours 06/17/19 1231     06/16/19 1000  remdesivir 100 mg in sodium chloride 0.9 % 100 mL IVPB     100 mg 200 mL/hr over 30 Minutes Intravenous Daily 06/15/19 1422 06/19/19 1030  06/15/19 2300  cefTRIAXone (ROCEPHIN) 1 g in sodium chloride 0.9 % 100 mL IVPB  Status:  Discontinued     1 g 200 mL/hr over 30 Minutes Intravenous Every 24 hours 06/15/19 0248 06/17/19 1231   06/15/19 1530  remdesivir 100 mg in sodium chloride 0.9 % 100  mL IVPB     100 mg 200 mL/hr over 30 Minutes Intravenous  Once 06/15/19 1422 06/15/19 1720   06/15/19 1430  remdesivir 100 mg in sodium chloride 0.9 % 100 mL IVPB     100 mg 200 mL/hr over 30 Minutes Intravenous  Once 06/15/19 1422 06/15/19 1719   06/14/19 2330  fosfomycin (MONUROL) packet 3 g  Status:  Discontinued     3 g Oral  Once 06/14/19 2247 06/14/19 2310   06/14/19 2315  cefTRIAXone (ROCEPHIN) 1 g in sodium chloride 0.9 % 100 mL IVPB     1 g 200 mL/hr over 30 Minutes Intravenous  Once 06/14/19 2310 06/15/19 0101      Subjective: Patient seen and examined at the bedside this morning.  Hemodynamically stable.  Was agitated this morning and sitter had to be ordered.  Respiratory status is stable.  On room air  Objective: Vitals:   06/20/19 1247 06/20/19 2228 06/21/19 0633 06/21/19 0800  BP: 137/80 (!) 152/81 (!) 152/97 (!) 148/76  Pulse: 72 76 78 82  Resp: 14 20 20 18   Temp: 98.4 F (36.9 C) 97.7 F (36.5 C) 98 F (36.7 C) 98.2 F (36.8 C)  TempSrc: Axillary Oral Oral Axillary  SpO2: 95% 95% 96% 97%  Weight:      Height:        Intake/Output Summary (Last 24 hours) at 06/21/2019 1350 Last data filed at 06/21/2019 1027 Gross per 24 hour  Intake 240 ml  Output 650 ml  Net -410 ml   Filed Weights   06/17/19 1712  Weight: 55.9 kg    Examination:  General exam: Elderly demented female, confused, agitated Respiratory system: Bilateral equal air entry, normal vesicular breath sounds, no wheezes or crackles  Cardiovascular system: S1 & S2 heard, RRR. No JVD, murmurs, rubs, gallops or clicks. Gastrointestinal system: Abdomen is nondistended, soft and nontender. No organomegaly or masses felt. Normal bowel sounds heard. Central nervous system: Not alert,awake  or oriented  extremities: No edema, no clubbing ,no cyanosis, distal peripheral pulses palpable. Skin: No rashes, lesions or ulcers,no icterus ,no pallor   Data Reviewed: I have personally reviewed following labs  and imaging studies  CBC: Recent Labs  Lab 06/14/19 1831 06/14/19 1831 06/16/19 0109 06/17/19 0725 06/18/19 0418 06/18/19 2314 06/19/19 0544  WBC 6.2   < > 3.4* 7.2 7.0 7.1 8.9  NEUTROABS 3.7  --  2.6  --   --   --   --   HGB 13.5   < > 13.8 13.4 13.7 13.7 14.3  HCT 41.3   < > 42.3 38.8 40.9 41.9 42.2  MCV 104.0*   < > 102.9* 100.3* 99.8 102.9* 101.0*  PLT 194   < > 192 207 215 235 216   < > = values in this interval not displayed.   Basic Metabolic Panel: Recent Labs  Lab 06/15/19 1452 06/16/19 0109 06/17/19 0725 06/18/19 0418 06/19/19 0544  NA 138 138 138 142 140  K 3.2* 3.1* 3.2* 3.6 3.3*  CL 104 104 105 108 105  CO2 25 21* 23 25 27   GLUCOSE 95 121* 105* 108* 99  BUN 12 14 26* 29* 25*  CREATININE 0.75 0.66 0.90 0.88 0.84  CALCIUM 8.3* 8.4* 8.4* 8.6* 8.7*  MG  --  1.8  --   --   --    GFR: Estimated Creatinine Clearance: 41.5 mL/min (by C-G formula based on SCr of 0.84 mg/dL). Liver Function Tests: Recent Labs  Lab 06/14/19 1831 06/16/19 0109 06/17/19 0725 06/18/19 0418 06/19/19 0544  AST 20 23 25 31  44*  ALT 17 16 16 22  32  ALKPHOS 106 90 85 85 88  BILITOT 0.4 0.5 0.7 0.6 0.8  PROT 7.2 6.5 6.1* 6.5 6.7  ALBUMIN 3.8 3.4* 3.4* 3.7 3.8   No results for input(s): LIPASE, AMYLASE in the last 168 hours. No results for input(s): AMMONIA in the last 168 hours. Coagulation Profile: No results for input(s): INR, PROTIME in the last 168 hours. Cardiac Enzymes: No results for input(s): CKTOTAL, CKMB, CKMBINDEX, TROPONINI in the last 168 hours. BNP (last 3 results) No results for input(s): PROBNP in the last 8760 hours. HbA1C: No results for input(s): HGBA1C in the last 72 hours. CBG: Recent Labs  Lab 06/18/19 2227  GLUCAP 104*   Lipid Profile: No results for input(s): CHOL, HDL, LDLCALC, TRIG, CHOLHDL, LDLDIRECT in the last 72 hours. Thyroid Function Tests: Recent Labs    06/19/19 0544 06/20/19 0543  FREET4  --  1.40*  T3FREE 1.1*  --    Anemia  Panel: Recent Labs    06/19/19 0544 06/20/19 0543  FERRITIN 74 72   Sepsis Labs: Recent Labs  Lab 06/15/19 0225 06/15/19 0412  LATICACIDVEN 0.8 1.6    Recent Results (from the past 240 hour(s))  Urine Culture     Status: Abnormal   Collection Time: 06/14/19  9:57 PM   Specimen: Urine, Random  Result Value Ref Range Status   Specimen Description   Final    URINE, RANDOM Performed at [facility] [address] [place], [name] [address]    Special Requests   Final    NONE Performed at [facility], [place] [address] [place], [place] [address]    Culture >=100,000 COLONIES/mL PROTEUS MIRABILIS (A)  Final   Report Status 06/17/2019 FINAL  Final   Organism ID, Bacteria PROTEUS MIRABILIS (A)  Final      Susceptibility   Proteus mirabilis - MIC*    AMPICILLIN <=2 SENSITIVE Sensitive     CEFAZOLIN <=4 SENSITIVE Sensitive     CEFTRIAXONE <=1 SENSITIVE Sensitive     CIPROFLOXACIN <=0.25 SENSITIVE Sensitive     GENTAMICIN <=1 SENSITIVE Sensitive     IMIPENEM 1 SENSITIVE Sensitive     NITROFURANTOIN 256 RESISTANT Resistant     TRIMETH/SULFA <=20 SENSITIVE Sensitive     AMPICILLIN/SULBACTAM <=2 SENSITIVE Sensitive     * >=100,000 COLONIES/mL PROTEUS MIRABILIS  Respiratory Panel by RT PCR (Flu A&B, Covid) - Nasopharyngeal Swab     Status: Abnormal   Collection Time: 06/14/19 11:11 PM   Specimen: Nasopharyngeal Swab  Result Value Ref Range Status   SARS Coronavirus 2 by RT PCR POSITIVE (A) NEGATIVE Final    Comment: RESULT CALLED TO, READ BACK BY AND VERIFIED WITH: BULLOCK,A @ [id number] ON [id number] BY POTEAT,S (NOTE) SARS-CoV-2 target nucleic acids are DETECTED. SARS-CoV-2 RNA is generally detectable in upper respiratory specimens  during the acute phase of infection. Positive results are indicative of the presence of the identified virus, but do not rule out bacterial infection or co-infection with other pathogens not detected by the test.  Clinical correlation with patient  history and other diagnostic information is necessary to determine patient infection status. The expected result is Negative. Fact Sheet for Patients:  PinkCheek.be Fact Sheet for Healthcare Providers: GravelBags.it This test is not yet approved or cleared by the [place] FDA and  has been authorized for detection and/or diagnosis of SARS-CoV-2 by FDA under an Emergency Use Authorization (EUA).  This EUA will remain in effect (meaning this test can be used ) for the duration of  the COVID-19 declaration under Section 564(b)(1) of the Act, 21 U.S.C. section 360bbb-3(b)(1), unless the authorization is terminated or revoked sooner.    Influenza A by PCR NEGATIVE NEGATIVE Final   Influenza B by PCR NEGATIVE NEGATIVE Final    Comment: (NOTE) The Xpert Xpress SARS-CoV-2/FLU/RSV assay is intended as an aid in  the diagnosis of influenza from Nasopharyngeal swab specimens and  should not be used as a sole basis for treatment. Nasal washings and  aspirates are unacceptable for Xpert Xpress SARS-CoV-2/FLU/RSV  testing. Fact Sheet for Patients: PinkCheek.be Fact Sheet for Healthcare Providers: GravelBags.it This test is not yet approved or cleared by the [place] FDA and  has been authorized for detection and/or diagnosis of SARS-CoV-2 by  FDA under an Emergency Use Authorization (EUA). This EUA will remain  in effect (meaning this test can be used) for the duration of the  Covid-19 declaration under Section 564(b)(1) of the Act, 21  U.S.C. section 360bbb-3(b)(1), unless the authorization is  terminated or revoked. Performed at [facility], [place] [address] [place], [place] [address]   Culture, blood (routine x 2)     Status: None   Collection Time: 06/15/19  2:25 AM   Specimen: BLOOD  Result Value Ref Range  Status   Specimen Description   Final    BLOOD LEFT ANTECUBITAL Performed at [facility] [address], [place], [place] [address]    Special Requests   Final    BOTTLES DRAWN AEROBIC AND ANAEROBIC Blood Culture adequate volume Performed at [facility] [address] [place], [place] [address]    Culture   Final    NO GROWTH 5 DAYS Performed at [facility], [place] [address] [place], [place] [address]    Report Status 06/20/2019 FINAL  Final  Culture, blood (routine x 2)     Status: None   Collection Time: 06/15/19  2:30 AM   Specimen: BLOOD  Result Value Ref Range Status   Specimen Description   Final    BLOOD RIGHT ANTECUBITAL Performed at [facility] [address] [place], [place] [address]    Special Requests   Final    BOTTLES DRAWN AEROBIC AND ANAEROBIC Blood Culture results may not be optimal due to an inadequate volume of blood received in culture bottles Performed at [facility] [address], [place], [place] [address]    Culture   Final    NO GROWTH 5 DAYS Performed at [facility], [place] [address] [place],  [address]    Report Status 06/20/2019 FINAL  Final         Radiology Studies: No results found.      Scheduled Meds: . amLODipine  10 mg Oral Daily  . buPROPion  150 mg Oral QHS  . dexamethasone (DECADRON) injection  6 mg Intravenous Daily  . memantine  28 mg Oral QHS   And  . donepezil  10 mg Oral QHS  . enoxaparin (  LOVENOX) injection  40 mg Subcutaneous Q24H  . feeding supplement (ENSURE ENLIVE)  237 mL Oral BID BM  . QUEtiapine  25 mg Oral QHS  . venlafaxine XR  75 mg Oral Q breakfast   Continuous Infusions: . sodium chloride 250 mL (06/20/19 2022)  .  ceFAZolin (ANCEF) IV 1 g (06/21/19 0942)     LOS: 6 days    Time spent: 25 mins,More than 50% of that time was spent in counseling and/or coordination of care.      Shelly Coss, MD [facility] P5/05/2019, 1:50 PM

## 2019-06-22 LAB — BASIC METABOLIC PANEL
Anion gap: 11 (ref 5–15)
BUN: 35 mg/dL — ABNORMAL HIGH (ref 8–23)
CO2: 23 mmol/L (ref 22–32)
Calcium: 8.7 mg/dL — ABNORMAL LOW (ref 8.9–10.3)
Chloride: 103 mmol/L (ref 98–111)
Creatinine, Ser: 1 mg/dL (ref 0.44–1.00)
GFR calc Af Amer: 59 mL/min — ABNORMAL LOW (ref >60)
GFR calc non Af Amer: 51 mL/min — ABNORMAL LOW (ref >60)
Glucose, Bld: 123 mg/dL — ABNORMAL HIGH (ref 70–99)
Potassium: 3.6 mmol/L (ref 3.5–5.1)
Sodium: 137 mmol/L (ref 135–145)

## 2019-06-22 NOTE — Progress Notes (Signed)
PROGRESS NOTE    [name]  [id number] DOB: [birth year] DOA: 06/14/2019 PCP: [name], NP   Brief Narrative: This 86 year old female with significant medical history of Alzheimer's dementia GERD, depression, hypertension, hyperlipidemia, vitamin D deficiency presented to the ED with her daughter due to concerns of rapid onset of weakness and difficulty with ambulation.Patient has received 1 dose of her COVID-19 vaccine .Patient was evaluated at her primary care provider's office byDina Ngetich NPearlier in the day on 4/27 and  the patient was advised to go to [facility] emergency department for further evaluation. In the ED, CT head did not show any  acute findings, she was suspected to have UTI and further work-up initiated.She was also found to be  positive for Covid.  Now waiting for placement. Patient is medically stable for discharge to skilled nursing facility as soon as bed is available.   Assessment & Plan:   Principal Problem:   Complicated UTI (urinary tract infection) Active Problems:   Hypothyroidism   Essential hypertension   Generalized weakness   Alzheimer's dementia with behavioral disturbance ([place])   COVID-19 virus infection   Rapidly worsening weakness/motor dysfunction,difficult ambulation:Suspecting multifactorialsecondary to UTI, COVID-19 infection.CT head did not show any acute intracranial normalities.  MRI brain showed moderate to advanced atrophy with extensive microvascular ischemia, no acute infarct, 1 cm calcified meningioma in the left posterior fossa which is unchanged from last imaging. Patient seen by OT and PT had recommended skilled nursing facility.  Complicated UTI:UA [id number], many bacteria, leukocytes large.urine culture more than 100,000 Proteus, she was  on cefazolin. Blood culture no growth so far. She completed Abx course.  COVID-19 virus infection:X-ray is unremarkable and she is not hypoxic.Inflammatory markers are  relatively stable . Patient has a significant weakness which could be contributed by her COVID-19 infectionas well. She completed remdesivirand she is on Decadronday #8/10.  Hypokalemia: Supplemented with potassium.  Hypothyroidism:She is on Synthyroid at home.  Currently her TSH is low and her T4 is high so Synthyroid discontinued.  Essential hypertension: Pressure is  well controlled,continue current meds.   Alzheimer's dementia with behavioral disturbance:continue Namenda, Aricept and venlafaxine and bupropion.On admission needed to be placed on mittens. Continue supportive care. Currently on  low-dose Seroquel.  Sitter discontinued   Nutrition Problem: Inadequate oral intake Etiology: acute illness, chronic illness(complicated UTI, [id number] virus infection; Alzheimer's dementia)      DVT prophylaxis:Lovenox Code Status: DNR Family Communication: Called daughter on phone on 06/20/19 Status is: Inpatient  Remains inpatient appropriate because:Unsafe d/c plan   Dispo: The patient is from: Home              Anticipated d/c is to: SNF              Anticipated d/c date is: As soon as bed is available              Patient currently is medically stable to d/c.   Consultants: None  Procedures:None  Antimicrobials:  Anti-infectives (From admission, onward)   Start     Dose/Rate Route Frequency Ordered Stop   06/17/19 2200  ceFAZolin (ANCEF) IVPB 1 g/50 mL premix  Status:  Discontinued     1 g 100 mL/hr over 30 Minutes Intravenous Every 12 hours 06/17/19 1231 06/21/19 1351   06/16/19 1000  remdesivir 100 mg in sodium chloride 0.9 % 100 mL IVPB     100 mg 200 mL/hr over 30 Minutes Intravenous Daily 06/15/19 1422 06/19/19 1030  06/15/19 2300  cefTRIAXone (ROCEPHIN) 1 g in sodium chloride 0.9 % 100 mL IVPB  Status:  Discontinued     1 g 200 mL/hr over 30 Minutes Intravenous Every 24 hours 06/15/19 0248 06/17/19 1231   06/15/19 1530  remdesivir 100 mg in sodium chloride  0.9 % 100 mL IVPB     100 mg 200 mL/hr over 30 Minutes Intravenous  Once 06/15/19 1422 06/15/19 1720   06/15/19 1430  remdesivir 100 mg in sodium chloride 0.9 % 100 mL IVPB     100 mg 200 mL/hr over 30 Minutes Intravenous  Once 06/15/19 1422 06/15/19 1719   06/14/19 2330  fosfomycin (MONUROL) packet 3 g  Status:  Discontinued     3 g Oral  Once 06/14/19 2247 06/14/19 2310   06/14/19 2315  cefTRIAXone (ROCEPHIN) 1 g in sodium chloride 0.9 % 100 mL IVPB     1 g 200 mL/hr over 30 Minutes Intravenous  Once 06/14/19 2310 06/15/19 0101      Subjective: Patient seen and examined at the bedside this morning.  Hemodynamically stable.  Calm, not agitated today.  Sitter discontinued.  Hopefully she can go to the skilled nursing facility tomorrow.  Objective: Vitals:   06/21/19 1300 06/21/19 2100 06/22/19 0518 06/22/19 1030  BP: (!) 147/82 (!) 148/88 (!) 144/80 135/84  Pulse: 88 91 66   Resp: 16 16 16    Temp: 97.9 F (36.6 C)     TempSrc: Axillary     SpO2: 94% 91% (!) 86%   Weight:      Height:        Intake/Output Summary (Last 24 hours) at 06/22/2019 1306 Last data filed at 06/22/2019 [id number] Gross per 24 hour  Intake 110 ml  Output 275 ml  Net -165 ml   Filed Weights   06/17/19 1712  Weight: 55.9 kg    Examination:  General exam: Elderly female, confused Respiratory system: Bilateral equal air entry, normal vesicular breath sounds, no wheezes or crackles  Cardiovascular system: S1 & S2 heard, RRR. No JVD, murmurs, rubs, gallops or clicks. Gastrointestinal system: Abdomen is nondistended, soft and nontender. No organomegaly or masses felt. Normal bowel sounds heard. Central nervous system: Not alert or oriented  extremities: No edema, no clubbing ,no cyanosis, distal peripheral pulses palpable. Skin: No rashes, lesions or ulcers,no icterus ,no pallor  Data Reviewed: I have personally reviewed following labs and imaging studies  CBC: Recent Labs  Lab 06/16/19 0109  06/17/19 0725 06/18/19 0418 06/18/19 2314 06/19/19 0544  WBC 3.4* 7.2 7.0 7.1 8.9  NEUTROABS 2.6  --   --   --   --   HGB 13.8 13.4 13.7 13.7 14.3  HCT 42.3 38.8 40.9 41.9 42.2  MCV 102.9* 100.3* 99.8 102.9* 101.0*  PLT 192 207 215 235 [id number]   Basic Metabolic Panel: Recent Labs  Lab 06/16/19 0109 06/17/19 0725 06/18/19 0418 06/19/19 0544 06/22/19 0328  NA 138 138 142 140 137  K 3.1* 3.2* 3.6 3.3* 3.6  CL 104 105 108 105 103  CO2 21* 23 25 27 23   GLUCOSE 121* 105* 108* 99 123*  BUN 14 26* 29* 25* 35*  CREATININE 0.66 0.90 0.88 0.84 1.00  CALCIUM 8.4* 8.4* 8.6* 8.7* 8.7*  MG 1.8  --   --   --   --    GFR: Estimated Creatinine Clearance: 34.9 mL/min (by C-G formula based on SCr of 1 mg/dL). Liver Function Tests: Recent Labs  Lab 06/16/19 0109 06/17/19 0725  06/18/19 0418 06/19/19 0544  AST 23 25 31  44*  ALT 16 16 22  32  ALKPHOS 90 85 85 88  BILITOT 0.5 0.7 0.6 0.8  PROT 6.5 6.1* 6.5 6.7  ALBUMIN 3.4* 3.4* 3.7 3.8   No results for input(s): LIPASE, AMYLASE in the last 168 hours. No results for input(s): AMMONIA in the last 168 hours. Coagulation Profile: No results for input(s): INR, PROTIME in the last 168 hours. Cardiac Enzymes: No results for input(s): CKTOTAL, CKMB, CKMBINDEX, TROPONINI in the last 168 hours. BNP (last 3 results) No results for input(s): PROBNP in the last 8760 hours. HbA1C: No results for input(s): HGBA1C in the last 72 hours. CBG: Recent Labs  Lab 06/18/19 2227  GLUCAP 104*   Lipid Profile: No results for input(s): CHOL, HDL, LDLCALC, TRIG, CHOLHDL, LDLDIRECT in the last 72 hours. Thyroid Function Tests: Recent Labs    06/20/19 0543  FREET4 1.40*   Anemia Panel: Recent Labs    06/20/19 0543  FERRITIN 72   Sepsis Labs: No results for input(s): PROCALCITON, LATICACIDVEN in the last 168 hours.  Recent Results (from the past 240 hour(s))  Urine Culture     Status: Abnormal   Collection Time: 06/14/19  9:57 PM   Specimen:  Urine, Random  Result Value Ref Range Status   Specimen Description   Final    URINE, RANDOM Performed at [facility] [address], [place], [place] [address]    Special Requests   Final    NONE Performed at [facility], [place] [address] [place], [place] [address]    Culture >=100,000 COLONIES/mL PROTEUS MIRABILIS (A)  Final   Report Status 06/17/2019 FINAL  Final   Organism ID, Bacteria PROTEUS MIRABILIS (A)  Final      Susceptibility   Proteus mirabilis - MIC*    AMPICILLIN <=2 SENSITIVE Sensitive     CEFAZOLIN <=4 SENSITIVE Sensitive     CEFTRIAXONE <=1 SENSITIVE Sensitive     CIPROFLOXACIN <=0.25 SENSITIVE Sensitive     GENTAMICIN <=1 SENSITIVE Sensitive     IMIPENEM 1 SENSITIVE Sensitive     NITROFURANTOIN 256 RESISTANT Resistant     TRIMETH/SULFA <=20 SENSITIVE Sensitive     AMPICILLIN/SULBACTAM <=2 SENSITIVE Sensitive     * >=100,000 COLONIES/mL PROTEUS MIRABILIS  Respiratory Panel by RT PCR (Flu A&B, Covid) - Nasopharyngeal Swab     Status: Abnormal   Collection Time: 06/14/19 11:11 PM   Specimen: Nasopharyngeal Swab  Result Value Ref Range Status   SARS Coronavirus 2 by RT PCR POSITIVE (A) NEGATIVE Final    Comment: RESULT CALLED TO, READ BACK BY AND VERIFIED WITH: BULLOCK,A @ [id number] ON [id number] BY POTEAT,S (NOTE) SARS-CoV-2 target nucleic acids are DETECTED. SARS-CoV-2 RNA is generally detectable in upper respiratory specimens  during the acute phase of infection. Positive results are indicative of the presence of the identified virus, but do not rule out bacterial infection or co-infection with other pathogens not detected by the test. Clinical correlation with patient history and other diagnostic information is necessary to determine patient infection status. The expected result is Negative. Fact Sheet for Patients:  PinkCheek.be Fact Sheet for Healthcare  Providers: GravelBags.it This test is not yet approved or cleared by the [place] FDA and  has been authorized for detection and/or diagnosis of SARS-CoV-2 by FDA under an Emergency Use Authorization (EUA).  This EUA will remain in effect (meaning this test can be used ) for the duration of  the  COVID-19 declaration under Section 564(b)(1) of the Act, 21 U.S.C. section 360bbb-3(b)(1), unless the authorization is terminated or revoked sooner.    Influenza A by PCR NEGATIVE NEGATIVE Final   Influenza B by PCR NEGATIVE NEGATIVE Final    Comment: (NOTE) The Xpert Xpress SARS-CoV-2/FLU/RSV assay is intended as an aid in  the diagnosis of influenza from Nasopharyngeal swab specimens and  should not be used as a sole basis for treatment. Nasal washings and  aspirates are unacceptable for Xpert Xpress SARS-CoV-2/FLU/RSV  testing. Fact Sheet for Patients: PinkCheek.be Fact Sheet for Healthcare Providers: GravelBags.it This test is not yet approved or cleared by the [place] FDA and  has been authorized for detection and/or diagnosis of SARS-CoV-2 by  FDA under an Emergency Use Authorization (EUA). This EUA will remain  in effect (meaning this test can be used) for the duration of the  Covid-19 declaration under Section 564(b)(1) of the Act, 21  U.S.C. section 360bbb-3(b)(1), unless the authorization is  terminated or revoked. Performed at [facility], [place] [address], [place], [place] [address]   Culture, blood (routine x 2)     Status: None   Collection Time: 06/15/19  2:25 AM   Specimen: BLOOD  Result Value Ref Range Status   Specimen Description   Final    BLOOD LEFT ANTECUBITAL Performed at [facility] [address], [place], [place] [address]    Special Requests   Final    BOTTLES DRAWN AEROBIC AND ANAEROBIC Blood Culture adequate  volume Performed at [facility] [address], [place], [place] [address]    Culture   Final    NO GROWTH 5 DAYS Performed at [facility], [place] [address], [place], [place] [address]    Report Status 06/20/2019 FINAL  Final  Culture, blood (routine x 2)     Status: None   Collection Time: 06/15/19  2:30 AM   Specimen: BLOOD  Result Value Ref Range Status   Specimen Description   Final    BLOOD RIGHT ANTECUBITAL Performed at [facility] [address] [place], [place] [address]    Special Requests   Final    BOTTLES DRAWN AEROBIC AND ANAEROBIC Blood Culture results may not be optimal due to an inadequate volume of blood received in culture bottles Performed at [facility] [address], [place], [place] [address]    Culture   Final    NO GROWTH 5 DAYS Performed at [facility], [place] [address], [place],  [address]    Report Status 06/20/2019 FINAL  Final         Radiology Studies: No results found.      Scheduled Meds: . amLODipine  10 mg Oral Daily  . buPROPion  150 mg Oral QHS  . dexamethasone (DECADRON) injection  6 mg Intravenous Daily  . memantine  28 mg Oral QHS   And  . donepezil  10 mg Oral QHS  . enoxaparin (LOVENOX) injection  40 mg Subcutaneous Q24H  . feeding supplement (ENSURE ENLIVE)  237 mL Oral BID BM  . QUEtiapine  25 mg Oral QHS  . venlafaxine XR  75 mg Oral Q breakfast   Continuous Infusions: . sodium chloride 250 mL (06/20/19 2022)     LOS: 7 days    Time spent: 25 mins,More than 50% of that time was spent in counseling and/or coordination of care.      Jamonte Curfman  Tawanna Solo, MD [facility] P5/06/2019, 1:06 PM

## 2019-06-22 NOTE — Progress Notes (Signed)
Daughter called checking on her mother and if PT saw patient today. Advised of last note. Will ask day nurse on 5/6 to contact PT to work with her if she does not get discharged to facility. Eulas Post, RN

## 2019-06-23 NOTE — Progress Notes (Signed)
Encouraged PO intake all- pt took a few bites of lunch and dinner. Continues to state she is not hungry and pushes staffs hand away.

## 2019-06-23 NOTE — Progress Notes (Signed)
PROGRESS NOTE    [name]  [id number] DOB: Sep 21, 1931 DOA: 06/14/2019 PCP: [name], NP   Brief Narrative: This 86 year old female with significant medical history of Alzheimer's dementia GERD, depression, hypertension, hyperlipidemia, vitamin D deficiency presented to the ED with her daughter due to concerns of rapid onset of weakness and difficulty with ambulation.Patient has received 1 dose of her COVID-19 vaccine .Patient was evaluated at her primary care provider's office on the day of 4/27 and the patient was advised to go to [facility] emergency department for further evaluation. In the ED, CT head did not show any acute findings, she was suspected to have UTI and further work-up initiated.She was also found to be positive for Covid.   Her hospital course treatment stable.  Currently she is maintaining her saturation on room air.  She completed the course of remdesivir and almost completing the course of Decadron.  She has been seen by PT and OT and recommended skilled nursing facility Patient is medically stable for discharge to skilled nursing facility today.  Assessment & Plan:   Principal Problem:   Complicated UTI (urinary tract infection) Active Problems:   Hypothyroidism   Essential hypertension   Generalized weakness   Alzheimer's dementia with behavioral disturbance ([name])   COVID-19 virus infection   Rapidly worsening weakness/motor dysfunction,difficult ambulation:Suspecting multifactorialsecondary to UTI, COVID-19 infection.CT head did not show any acute intracranial normalities.  MRI brain showed moderate to advanced atrophy with extensive microvascular ischemia, no acute infarct, 1 cm calcified meningioma in the left posterior fossa which is unchanged from last imaging. Patient seen by OT and PT had recommended skilled nursing facility.  Complicated UTI:UA [id number], many bacteria, leukocytes large.urine culture more than 100,000 Proteus,  she was  on cefazolin. Blood culture no growth so far. She completed Abx course.  COVID-19 virus infection:X-ray is unremarkable and she is not hypoxic.Inflammatory markers are relatively stable . Patient has a significant weakness which could be contributed by her COVID-19 infectionas well. She completed remdesivirand she is on Decadronday #9/10.  Hypokalemia: Supplemented with potassium.  Hypothyroidism:She is on Synthyroid at home.  Currently her TSH is low and her T4 is high so Synthyroid discontinued.  Essential hypertension: Pressure is  well controlled,continue current meds.   Alzheimer's dementia with behavioral disturbance:continue Namenda, Aricept and venlafaxine and bupropion.On admission needed to be placed on mittens. Continue supportive care. Currently on  low-dose Seroquel.  Sitter discontinued   Nutrition Problem: Inadequate oral intake Etiology: acute illness, chronic illness(complicated UTI, [id number] virus infection; Alzheimer's dementia)      DVT prophylaxis:Lovenox Code Status: DNR Family Communication: Called daughter on phone on 06/20/19 Status is: Inpatient  Remains inpatient appropriate because:Unsafe d/c plan   Dispo: The patient is from: Home              Anticipated d/c is to: SNF              Anticipated d/c date is: As soon as bed is available              Patient currently is medically stable to d/c.   Consultants: None  Procedures:None  Antimicrobials:  Anti-infectives (From admission, onward)   Start     Dose/Rate Route Frequency Ordered Stop   06/17/19 2200  ceFAZolin (ANCEF) IVPB 1 g/50 mL premix  Status:  Discontinued     1 g 100 mL/hr over 30 Minutes Intravenous Every 12 hours 06/17/19 1231 06/21/19 1351   06/16/19 1000  remdesivir 100  mg in sodium chloride 0.9 % 100 mL IVPB     100 mg 200 mL/hr over 30 Minutes Intravenous Daily 06/15/19 1422 06/19/19 1030   06/15/19 2300  cefTRIAXone (ROCEPHIN) 1 g in sodium chloride 0.9 %  100 mL IVPB  Status:  Discontinued     1 g 200 mL/hr over 30 Minutes Intravenous Every 24 hours 06/15/19 0248 06/17/19 1231   06/15/19 1530  remdesivir 100 mg in sodium chloride 0.9 % 100 mL IVPB     100 mg 200 mL/hr over 30 Minutes Intravenous  Once 06/15/19 1422 06/15/19 1720   06/15/19 1430  remdesivir 100 mg in sodium chloride 0.9 % 100 mL IVPB     100 mg 200 mL/hr over 30 Minutes Intravenous  Once 06/15/19 1422 06/15/19 1719   06/14/19 2330  fosfomycin (MONUROL) packet 3 g  Status:  Discontinued     3 g Oral  Once 06/14/19 2247 06/14/19 2310   06/14/19 2315  cefTRIAXone (ROCEPHIN) 1 g in sodium chloride 0.9 % 100 mL IVPB     1 g 200 mL/hr over 30 Minutes Intravenous  Once 06/14/19 2310 06/15/19 0101      Subjective: Patient seen and examined the bedside this morning.  Hemodynamically stable.  Sitting in the chair today.  Not agitated.  Calm and comfortable.  Objective: Vitals:   06/22/19 2059 06/23/19 0453 06/23/19 1435 06/23/19 1437  BP: 125/71 (!) 142/76 126/73   Pulse: 85 72 72   Resp: 18 17 12    Temp: 98 F (36.7 C)   98.7 F (37.1 C)  TempSrc: Oral   Axillary  SpO2: 96% 98% 97%   Weight:      Height:        Intake/Output Summary (Last 24 hours) at 06/23/2019 1453 Last data filed at 06/23/2019 1230 Gross per 24 hour  Intake 400 ml  Output 600 ml  Net -200 ml   Filed Weights   06/17/19 1712  Weight: 55.9 kg    Examination:   General exam: elderly female,comfortable,mot in any kind of distress Respiratory system: Bilateral equal air entry, normal vesicular breath sounds, no wheezes or crackles  Cardiovascular system: S1 & S2 heard, RRR. No JVD, murmurs, rubs, gallops or clicks. Gastrointestinal system: Abdomen is nondistended, soft and nontender. No organomegaly or masses felt. Normal bowel sounds heard. Central nervous system: Alert and awake but not oriented Extremities: No edema, no clubbing ,no cyanosis, distal peripheral pulses palpable. Skin: No  rashes, lesions or ulcers,no icterus ,no pallor   Data Reviewed: I have personally reviewed following labs and imaging studies  CBC: Recent Labs  Lab 06/17/19 0725 06/18/19 0418 06/18/19 2314 06/19/19 0544  WBC 7.2 7.0 7.1 8.9  HGB 13.4 13.7 13.7 14.3  HCT 38.8 40.9 41.9 42.2  MCV 100.3* 99.8 102.9* 101.0*  PLT 207 215 235 [id number]   Basic Metabolic Panel: Recent Labs  Lab 06/17/19 0725 06/18/19 0418 06/19/19 0544 06/22/19 0328  NA 138 142 140 137  K 3.2* 3.6 3.3* 3.6  CL 105 108 105 103  CO2 23 25 27 23   GLUCOSE 105* 108* 99 123*  BUN 26* 29* 25* 35*  CREATININE 0.90 0.88 0.84 1.00  CALCIUM 8.4* 8.6* 8.7* 8.7*   GFR: Estimated Creatinine Clearance: 34.9 mL/min (by C-G formula based on SCr of 1 mg/dL). Liver Function Tests: Recent Labs  Lab 06/17/19 0725 06/18/19 0418 06/19/19 0544  AST 25 31 44*  ALT 16 22 32  ALKPHOS 85 85 88  BILITOT  0.7 0.6 0.8  PROT 6.1* 6.5 6.7  ALBUMIN 3.4* 3.7 3.8   No results for input(s): LIPASE, AMYLASE in the last 168 hours. No results for input(s): AMMONIA in the last 168 hours. Coagulation Profile: No results for input(s): INR, PROTIME in the last 168 hours. Cardiac Enzymes: No results for input(s): CKTOTAL, CKMB, CKMBINDEX, TROPONINI in the last 168 hours. BNP (last 3 results) No results for input(s): PROBNP in the last 8760 hours. HbA1C: No results for input(s): HGBA1C in the last 72 hours. CBG: Recent Labs  Lab 06/18/19 2227  GLUCAP 104*   Lipid Profile: No results for input(s): CHOL, HDL, LDLCALC, TRIG, CHOLHDL, LDLDIRECT in the last 72 hours. Thyroid Function Tests: No results for input(s): TSH, T4TOTAL, FREET4, T3FREE, THYROIDAB in the last 72 hours. Anemia Panel: No results for input(s): VITAMINB12, FOLATE, FERRITIN, TIBC, IRON, RETICCTPCT in the last 72 hours. Sepsis Labs: No results for input(s): PROCALCITON, LATICACIDVEN in the last 168 hours.  Recent Results (from the past 240 hour(s))  Urine Culture      Status: Abnormal   Collection Time: 06/14/19  9:57 PM   Specimen: Urine, Random  Result Value Ref Range Status   Specimen Description   Final    URINE, RANDOM Performed at [facility] [address] [place], [place] [address]    Special Requests   Final    NONE Performed at [facility], [place] [address] [place], [place] [address]    Culture >=100,000 COLONIES/mL PROTEUS MIRABILIS (A)  Final   Report Status 06/17/2019 FINAL  Final   Organism ID, Bacteria PROTEUS MIRABILIS (A)  Final      Susceptibility   Proteus mirabilis - MIC*    AMPICILLIN <=2 SENSITIVE Sensitive     CEFAZOLIN <=4 SENSITIVE Sensitive     CEFTRIAXONE <=1 SENSITIVE Sensitive     CIPROFLOXACIN <=0.25 SENSITIVE Sensitive     GENTAMICIN <=1 SENSITIVE Sensitive     IMIPENEM 1 SENSITIVE Sensitive     NITROFURANTOIN 256 RESISTANT Resistant     TRIMETH/SULFA <=20 SENSITIVE Sensitive     AMPICILLIN/SULBACTAM <=2 SENSITIVE Sensitive     * >=100,000 COLONIES/mL PROTEUS MIRABILIS  Respiratory Panel by RT PCR (Flu A&B, Covid) - Nasopharyngeal Swab     Status: Abnormal   Collection Time: 06/14/19 11:11 PM   Specimen: Nasopharyngeal Swab  Result Value Ref Range Status   SARS Coronavirus 2 by RT PCR POSITIVE (A) NEGATIVE Final    Comment: RESULT CALLED TO, READ BACK BY AND VERIFIED WITH: BULLOCK,A @ [id number] ON [id number] BY POTEAT,S (NOTE) SARS-CoV-2 target nucleic acids are DETECTED. SARS-CoV-2 RNA is generally detectable in upper respiratory specimens  during the acute phase of infection. Positive results are indicative of the presence of the identified virus, but do not rule out bacterial infection or co-infection with other pathogens not detected by the test. Clinical correlation with patient history and other diagnostic information is necessary to determine patient infection status. The expected result is Negative. Fact Sheet for Patients:   PinkCheek.be Fact Sheet for Healthcare Providers: GravelBags.it This test is not yet approved or cleared by the [place] FDA and  has been authorized for detection and/or diagnosis of SARS-CoV-2 by FDA under an Emergency Use Authorization (EUA).  This EUA will remain in effect (meaning this test can be used ) for the duration of  the COVID-19 declaration under Section 564(b)(1) of the Act, 21 U.S.C. section 360bbb-3(b)(1), unless the authorization is terminated or revoked sooner.  Influenza A by PCR NEGATIVE NEGATIVE Final   Influenza B by PCR NEGATIVE NEGATIVE Final    Comment: (NOTE) The Xpert Xpress SARS-CoV-2/FLU/RSV assay is intended as an aid in  the diagnosis of influenza from Nasopharyngeal swab specimens and  should not be used as a sole basis for treatment. Nasal washings and  aspirates are unacceptable for Xpert Xpress SARS-CoV-2/FLU/RSV  testing. Fact Sheet for Patients: PinkCheek.be Fact Sheet for Healthcare Providers: GravelBags.it This test is not yet approved or cleared by the [place] FDA and  has been authorized for detection and/or diagnosis of SARS-CoV-2 by  FDA under an Emergency Use Authorization (EUA). This EUA will remain  in effect (meaning this test can be used) for the duration of the  Covid-19 declaration under Section 564(b)(1) of the Act, 21  U.S.C. section 360bbb-3(b)(1), unless the authorization is  terminated or revoked. Performed at [facility], [place] [address], [place], [place] [address]   Culture, blood (routine x 2)     Status: None   Collection Time: 06/15/19  2:25 AM   Specimen: BLOOD  Result Value Ref Range Status   Specimen Description   Final    BLOOD LEFT ANTECUBITAL Performed at [facility] [address], [place], [place] [address]    Special Requests   Final     BOTTLES DRAWN AEROBIC AND ANAEROBIC Blood Culture adequate volume Performed at [facility] [address] [place], [place] [address]    Culture   Final    NO GROWTH 5 DAYS Performed at [facility], [place] [address], [place], [place] [address]    Report Status 06/20/2019 FINAL  Final  Culture, blood (routine x 2)     Status: None   Collection Time: 06/15/19  2:30 AM   Specimen: BLOOD  Result Value Ref Range Status   Specimen Description   Final    BLOOD RIGHT ANTECUBITAL Performed at [facility] [address], [place], [place] [address]    Special Requests   Final    BOTTLES DRAWN AEROBIC AND ANAEROBIC Blood Culture results may not be optimal due to an inadequate volume of blood received in culture bottles Performed at [facility] [address] [place], [place] [address]    Culture   Final    NO GROWTH 5 DAYS Performed at [facility], [place] [address], [place], [place] [address]    Report Status 06/20/2019 FINAL  Final         Radiology Studies: No results found.      Scheduled Meds: . amLODipine  10 mg Oral Daily  . buPROPion  150 mg Oral QHS  . dexamethasone (DECADRON) injection  6 mg Intravenous Daily  . memantine  28 mg Oral QHS   And  . donepezil  10 mg Oral QHS  . enoxaparin (LOVENOX) injection  40 mg Subcutaneous Q24H  . feeding supplement (ENSURE ENLIVE)  237 mL Oral BID BM  . QUEtiapine  25 mg Oral QHS  . venlafaxine XR  75 mg Oral Q breakfast   Continuous Infusions: . sodium chloride 250 mL (06/20/19 2022)     LOS: 8 days    Time spent: 25 mins,More than 50% of that time was spent in counseling and/or coordination of care.      Shelly Coss, MD [facility] P5/07/2019, 2:53 PM

## 2019-06-23 NOTE — Progress Notes (Signed)
Physical Therapy Treatment Patient Details Name: [name] MRN: [id number] DOB: May 24, 1931 Today's Date: 06/23/2019    History of Present Illness 86 year old female with past medical history of Alzheimer's dementia, gastroesophageal reflux disease, depression, hypertension, hyperlipidemia and vitamin D deficiency who presents to [facility] long hospital with her daughter due to concerns for rapid onset of weakness and difficulty with ambulation. CT head  no acute findings, MRI pending. Suspected UTI    PT Comments    Patient requires max assist for sitting up, following simple directions with increased time. 2 person  arm hold assist to stand and pivot to recliner. Continue PT for mobility.  Follow Up Recommendations  SNF;Supervision/Assistance - 24 hour     Equipment Recommendations  None recommended by PT    Recommendations for Other Services       Precautions / Restrictions Precautions Precautions: Fall Precaution Comments: fell in hospital 5/01    Mobility  Bed Mobility   Bed Mobility: Supine to Sit     Supine to sit: Max assist;HOB elevated     General bed mobility comments: assist with legs and trunk, using bed pad to scoop the patient to sitting  Transfers Overall transfer level: Needs assistance Equipment used: 2 person hand held assist Transfers: Sit to/from [facility] Sit to Stand: Mod assist;Max assist Stand pivot transfers: Max assist       General transfer comment: patient did bear weight and  take small shuffle steps with tctile cues  Ambulation/Gait                 Stairs             Wheelchair Mobility    Modified Rankin (Stroke Patients Only)       Balance Overall balance assessment: Needs assistance Sitting-balance support: Bilateral upper extremity supported;Feet supported Sitting balance-Leahy Scale: Fair Sitting balance - Comments: sits at midline     Standing balance-Leahy Scale: Poor                               Cognition Arousal/Alertness: Lethargic Behavior During Therapy: Flat affect Overall Cognitive Status: No family/caregiver present to determine baseline cognitive functioning                                 General Comments: follows somple commands with cues and increased time      Exercises      General Comments        Pertinent Vitals/Pain Pain Assessment: No/denies pain    Home Living                      Prior Function            PT Goals (current goals can now be found in the care plan section) Progress towards PT goals: Progressing toward goals    Frequency    Min 2X/week      PT Plan Current plan remains appropriate    Co-evaluation              AM-PAC PT "6 Clicks" Mobility   Outcome Measure  Help needed turning from your back to your side while in a flat bed without using bedrails?: A Lot Help needed moving from lying on your back to sitting on the side of a flat bed without using bedrails?: A Lot Help needed moving  to and from a bed to a chair (including a wheelchair)?: Total Help needed standing up from a chair using your arms (e.g., wheelchair or bedside chair)?: Total Help needed to walk in hospital room?: Total Help needed climbing 3-5 steps with a railing? : Total 6 Click Score: 8    End of Session   Activity Tolerance: Patient limited by fatigue Patient left: in chair;with call bell/phone within reach;with chair alarm set;with nursing/sitter in room Nurse Communication: Mobility status PT Visit Diagnosis: Difficulty in walking, not elsewhere classified (R26.2)     Time: [id number] PT Time Calculation (min) (ACUTE ONLY): 15 min  Charges:  $Therapeutic Activity: 8-22 mins                     [place] Pager [phone] Office [phone]    Claretha Cooper 06/23/2019, 1:12 PM

## 2019-06-23 NOTE — Progress Notes (Signed)
Nutrition Follow-up  DOCUMENTATION CODES:   Not applicable  INTERVENTION:  Continue Ensure Enlive BID Continue Magic Cup TID with meals Continue to encourage po intake and offer feeding assistance as needed  Consider giving PRN Miralax, no documented BMs this admission  NUTRITION DIAGNOSIS:   Inadequate oral intake related to acute illness, chronic illness(complicated UTI, [id number] virus infection; Alzheimer's dementia) as evidenced by meal completion < 25%. Ongoing, addressing via nutrition supplements    GOAL:   Patient will meet greater than or equal to 90% of their needs Progrressing    MONITOR:   PO intake, Weight trends, Supplement acceptance, Diet advancement, I & O's, Labs  REASON FOR ASSESSMENT:   Consult Assessment of nutrition requirement/status  ASSESSMENT:  RD working remotely.  86 year old female with past medical history of Alzheimer's dementia, GERD, depression, HTN, HLD, and vitamin D deficiency admitted with complicated UTI and found to be COVID-19 positive on admission after presenting with acute onset significant difficulty with ambulation and family reports nonproductive cough for approximately 3 days prior to presentation.  Patient is medically stable for discharge to SNF pending bed availability.  Diet liberated to regular on 4/29, per flowsheets she has been eating 20-35% of meals from 5/2-5/6. She is provided Ensure BID and has been consuming them ~50% of the time. There are no documented BMs this admission, ?constipation.  Pt may benefit from a stool softener and PRN Miralax.  Current wt 122.98 No new weight obtained on admission, per history pt weighed 124.74 lbs on 05/26/19, on 10/22/18 pt weighed 130.02 lbs, noted stable 130-134 lbs from January 2020 - September 2020. This indicates a 1.76 lb (1.4%) wt loss in the past 3 weeks and a 7 lb (5.4%) wt loss over the past 8 months which is insignificant, however concerning given advanced age and  Alzheimer's dementia.   Medications reviewed and include: Decadron, Namenda, Seroquel Labs reviewed  Diet Order:   Diet Order            Diet regular Room service appropriate? Yes; Fluid consistency: Thin  Diet effective now              EDUCATION NEEDS:   Not appropriate for education at this time  Skin:  Skin Assessment: Reviewed RN Assessment  Last BM:  PTA  Height:   Ht Readings from Last 1 Encounters:  06/17/19 5\' 4"  (1.626 m)    Weight:   Wt Readings from Last 1 Encounters:  06/17/19 55.9 kg    BMI:  Body mass index is 21.15 kg/m.  Estimated Nutritional Needs:   Kcal:  1750-2000  Protein:  90-100  Fluid:  >/= 1.8 L/day   Lajuan Lines, RD, LDN Clinical Nutrition After Hours/Weekend Pager # in [place]

## 2019-06-24 DIAGNOSIS — K625 Hemorrhage of anus and rectum: Secondary | ICD-10-CM | POA: Diagnosis not present

## 2019-06-24 DIAGNOSIS — R410 Disorientation, unspecified: Secondary | ICD-10-CM | POA: Diagnosis not present

## 2019-06-24 DIAGNOSIS — R05 Cough: Secondary | ICD-10-CM | POA: Diagnosis not present

## 2019-06-24 DIAGNOSIS — J189 Pneumonia, unspecified organism: Secondary | ICD-10-CM | POA: Diagnosis not present

## 2019-06-24 DIAGNOSIS — G9340 Encephalopathy, unspecified: Secondary | ICD-10-CM | POA: Diagnosis not present

## 2019-06-24 DIAGNOSIS — M2550 Pain in unspecified joint: Secondary | ICD-10-CM | POA: Diagnosis not present

## 2019-06-24 DIAGNOSIS — M6259 Muscle wasting and atrophy, not elsewhere classified, multiple sites: Secondary | ICD-10-CM | POA: Diagnosis not present

## 2019-06-24 DIAGNOSIS — G301 Alzheimer's disease with late onset: Secondary | ICD-10-CM | POA: Diagnosis not present

## 2019-06-24 DIAGNOSIS — R7989 Other specified abnormal findings of blood chemistry: Secondary | ICD-10-CM | POA: Diagnosis not present

## 2019-06-24 DIAGNOSIS — K5909 Other constipation: Secondary | ICD-10-CM | POA: Diagnosis not present

## 2019-06-24 DIAGNOSIS — E039 Hypothyroidism, unspecified: Secondary | ICD-10-CM | POA: Diagnosis not present

## 2019-06-24 DIAGNOSIS — I10 Essential (primary) hypertension: Secondary | ICD-10-CM | POA: Diagnosis not present

## 2019-06-24 DIAGNOSIS — R531 Weakness: Secondary | ICD-10-CM | POA: Diagnosis not present

## 2019-06-24 DIAGNOSIS — E876 Hypokalemia: Secondary | ICD-10-CM | POA: Diagnosis not present

## 2019-06-24 DIAGNOSIS — R42 Dizziness and giddiness: Secondary | ICD-10-CM | POA: Diagnosis not present

## 2019-06-24 DIAGNOSIS — R1311 Dysphagia, oral phase: Secondary | ICD-10-CM | POA: Diagnosis not present

## 2019-06-24 DIAGNOSIS — F29 Unspecified psychosis not due to a substance or known physiological condition: Secondary | ICD-10-CM | POA: Diagnosis not present

## 2019-06-24 DIAGNOSIS — R109 Unspecified abdominal pain: Secondary | ICD-10-CM | POA: Diagnosis not present

## 2019-06-24 DIAGNOSIS — U071 COVID-19: Secondary | ICD-10-CM | POA: Diagnosis not present

## 2019-06-24 DIAGNOSIS — D72829 Elevated white blood cell count, unspecified: Secondary | ICD-10-CM | POA: Diagnosis not present

## 2019-06-24 DIAGNOSIS — K5900 Constipation, unspecified: Secondary | ICD-10-CM | POA: Diagnosis not present

## 2019-06-24 DIAGNOSIS — N390 Urinary tract infection, site not specified: Secondary | ICD-10-CM | POA: Diagnosis not present

## 2019-06-24 DIAGNOSIS — Z79899 Other long term (current) drug therapy: Secondary | ICD-10-CM | POA: Diagnosis not present

## 2019-06-24 DIAGNOSIS — R799 Abnormal finding of blood chemistry, unspecified: Secondary | ICD-10-CM | POA: Diagnosis not present

## 2019-06-24 DIAGNOSIS — R2689 Other abnormalities of gait and mobility: Secondary | ICD-10-CM | POA: Diagnosis not present

## 2019-06-24 DIAGNOSIS — R2681 Unsteadiness on feet: Secondary | ICD-10-CM | POA: Diagnosis not present

## 2019-06-24 DIAGNOSIS — Z7401 Bed confinement status: Secondary | ICD-10-CM | POA: Diagnosis not present

## 2019-06-24 MED ORDER — QUETIAPINE FUMARATE 25 MG PO TABS: 25.0000 mg | ORAL_TABLET | Freq: Every day | ORAL | Status: DC

## 2019-06-24 MED ORDER — AMLODIPINE BESYLATE 10 MG PO TABS: 10.0000 mg | ORAL_TABLET | Freq: Every day | ORAL | Status: DC

## 2019-06-24 MED ORDER — DEXAMETHASONE 4 MG PO TABS
6.0000 mg | ORAL_TABLET | Freq: Every day | ORAL | Status: AC
  Administered 2019-06-24: 6 mg via ORAL
  Filled 2019-06-24: qty 2

## 2019-06-24 NOTE — Discharge Summary (Signed)
Physician Discharge Summary  [name] [id number] DOB: 1931-07-26 DOA: 06/14/2019  PCP: [name], NP  Admit date: 06/14/2019 Discharge date: 06/24/2019  Admitted From: Home Disposition:  Home  Discharge Condition:Stable CODE STATUS: DNR Diet recommendation: Heart Healthy   Brief/Interim Summary: This 86 year old female with significant medical history of Alzheimer's dementia GERD, depression, hypertension, hyperlipidemia, vitamin D deficiency presented to the ED with her daughter due to concerns of rapid onset of weakness and difficulty with ambulation.Patient has received 1 dose of her COVID-19 vaccine .Patient was evaluated at her primary care provider's officeonthe dayof4/27 and the patient was advised to go to [facility] emergency department for further evaluation. In the ED, CT head did not show any acute findings, she was suspected to have UTI and further work-up initiated.She was also found to be positive for Covid.Her hospital course treatment stable. Currently she is maintaining her saturation on room air. She completed the course of remdesivir and Decadron. She has been seen by PT and OT and recommended skilled nursing facility Patient is medically stable for discharge to skilled nursing facilityas soon as bed is available.  Following problems were addressed during her hospitalization:   Rapidly worsening weakness/motor dysfunction,difficult ambulation:Suspecting multifactorialsecondary to UTI, COVID-19 infection.CT head did not show any acute intracranial normalities.  MRI brain showed moderate to advanced atrophy with extensive microvascular ischemia, no acute infarct, 1 cm calcified meningioma in the left posterior fossa which is unchanged from last imaging. Patient seen by OT and PT had recommended skilled nursing facility.  Complicated UTI:UA [id number], many bacteria, leukocytes large.urine culture more than 100,000Proteus, she was  on  cefazolin. Blood culture no growth so far. She completed Abx course.  COVID-19 virus infection:X-ray is unremarkable and she is not hypoxic.Inflammatory markers are relatively stable.Patient has a significant weakness which could be contributed by her COVID-19 infectionas well. She completed remdesivirand  Decadron.  Hypokalemia: Supplemented and corrected.  Hypothyroidism:She is on Synthyroid at home.  Currently her TSH is low and her T4 is high so Synthyroid discontinued.  Essential hypertension: Pressure iswell controlled,continue current meds.   Alzheimer's dementia with behavioral disturbance:continue Namenda, Aricept and venlafaxine and bupropion.On admission needed to be placed on mittens. Continue supportive care. Currently on  low-dose Seroquel.  Sitter discontinued  Discharge Diagnoses:  Principal Problem:   Complicated UTI (urinary tract infection) Active Problems:   Hypothyroidism   Essential hypertension   Generalized weakness   Alzheimer's dementia with behavioral disturbance ([place])   COVID-19 virus infection    Discharge Instructions  Discharge Instructions    Diet - low sodium heart healthy   Complete by: As directed    Discharge instructions   Complete by: As directed    1)Please take prescribed medications as instructed. 2)Infection Prevention Recommendations for Individuals Confirmed to have, or Being Evaluated for, 2019 Novel Coronavirus (COVID-19) Infection Who Receive Care at Home  Individuals who are confirmed to have, or are being evaluated for, COVID-19 should follow the prevention steps below until a healthcare provider or local or state health department says they can return to normal activities.  Stay home except to get medical care You should restrict activities outside your home, except for getting medical care. Do not go to work, school, or public areas, and do not use public transportation or taxis.  Call ahead before  visiting your doctor Before your medical appointment, call the healthcare provider and tell them that you have, or are being evaluated for, COVID-19 infection. This will help the healthcare  provider's office take steps to keep other people from getting infected. Ask your healthcare provider to call the local or state health department.  Monitor your symptoms Seek prompt medical attention if your illness is worsening (e.g., difficulty breathing). Before going to your medical appointment, call the healthcare provider and tell them that you have, or are being evaluated for, COVID-19 infection. Ask your healthcare provider to call the local or state health department.  Wear a facemask You should wear a facemask that covers your nose and mouth when you are in the same room with other people and when you visit a healthcare provider. People who live with or visit you should also wear a facemask while they are in the same room with you.  Separate yourself from other people in your home As much as possible, you should stay in a different room from other people in your home. Also, you should use a separate bathroom, if available.  Avoid sharing household items You should not share dishes, drinking glasses, cups, eating utensils, towels, bedding, or other items with other people in your home. After using these items, you should wash them thoroughly with soap and water.  Cover your coughs and sneezes Cover your mouth and nose with a tissue when you cough or sneeze, or you can cough or sneeze into your sleeve. Throw used tissues in a lined trash can, and immediately wash your hands with soap and water for at least 20 seconds or use an alcohol-based hand rub.  Wash your [facility] your hands often and thoroughly with soap and water for at least 20 seconds. You can use an alcohol-based hand sanitizer if soap and water are not available and if your hands are not visibly dirty. Avoid touching your  eyes, nose, and mouth with unwashed hands.   Prevention Steps for Caregivers and Household Members of Individuals Confirmed to have, or Being Evaluated for, COVID-19 Infection Being Cared for in the Home  If you live with, or provide care at home for, a person confirmed to have, or being evaluated for, COVID-19 infection please follow these guidelines to prevent infection:  Follow healthcare provider's instructions Make sure that you understand and can help the patient follow any healthcare provider instructions for all care.  Provide for the patient's basic needs You should help the patient with basic needs in the home and provide support for getting groceries, prescriptions, and other personal needs.  Monitor the patient's symptoms If they are getting sicker, call his or her medical provider and tell them that the patient has, or is being evaluated for, COVID-19 infection. This will help the healthcare provider's office take steps to keep other people from getting infected. Ask the healthcare provider to call the local or state health department.  Limit the number of people who have contact with the patient If possible, have only one caregiver for the patient. Other household members should stay in another home or place of residence. If this is not possible, they should stay in another room, or be separated from the patient as much as possible. Use a separate bathroom, if available. Restrict visitors who do not have an essential need to be in the home.  Keep older adults, very young children, and other sick people away from the patient Keep older adults, very young children, and those who have compromised immune systems or chronic health conditions away from the patient. This includes people with chronic heart, lung, or kidney conditions, diabetes, and cancer.  Ensure good  ventilation Make sure that shared spaces in the home have good air flow, such as from an air  conditioner or an opened window, weather permitting.  Wash your hands often Wash your hands often and thoroughly with soap and water for at least 20 seconds. You can use an alcohol based hand sanitizer if soap and water are not available and if your hands are not visibly dirty. Avoid touching your eyes, nose, and mouth with unwashed hands. Use disposable paper towels to dry your hands. If not available, use dedicated cloth towels and replace them when they become wet.  Wear a facemask and gloves Wear a disposable facemask at all times in the room and gloves when you touch or have contact with the patient's blood, body fluids, and/or secretions or excretions, such as sweat, saliva, sputum, nasal mucus, vomit, urine, or feces. Ensure the mask fits over your nose and mouth tightly, and do not touch it during use. Throw out disposable facemasks and gloves after using them. Do not reuse. Wash your hands immediately after removing your facemask and gloves. If your personal clothing becomes contaminated, carefully remove clothing and launder. Wash your hands after handling contaminated clothing. Place all used disposable facemasks, gloves, and other waste in a lined container before disposing them with other household waste. Remove gloves and wash your hands immediately after handling these items.  Do not share dishes, glasses, or other household items with the patient Avoid sharing household items. You should not share dishes, drinking glasses, cups, eating utensils, towels, bedding, or other items with a patient who is confirmed to have, or being evaluated for, COVID-19 infection. After the person uses these items, you should wash them thoroughly with soap and water.  Wash laundry thoroughly Immediately remove and wash clothes or bedding that have blood, body fluids, and/or secretions or excretions, such as sweat, saliva, sputum, nasal mucus, vomit, urine, or feces, on them. Wear gloves when  handling laundry from the patient. Read and follow directions on labels of laundry or clothing items and detergent. In general, wash and dry with the warmest temperatures recommended on the label.  Clean all areas the individual has used often Clean all touchable surfaces, such as counters, tabletops, doorknobs, bathroom fixtures, toilets, phones, keyboards, tablets, and bedside tables, every day. Also, clean any surfaces that may have blood, body fluids, and/or secretions or excretions on them. Wear gloves when cleaning surfaces the patient has come in contact with. Use a diluted bleach solution (e.g., dilute bleach with 1 part bleach and 10 parts water) or a household disinfectant with a label that says EPA-registered for coronaviruses. To make a bleach solution at home, add 1 tablespoon of bleach to 1 quart (4 cups) of water. For a larger supply, add  cup of bleach to 1 gallon (16 cups) of water. Read labels of cleaning products and follow recommendations provided on product labels. Labels contain instructions for safe and effective use of the cleaning product including precautions you should take when applying the product, such as wearing gloves or eye protection and making sure you have good ventilation during use of the product. Remove gloves and wash hands immediately after cleaning.  Monitor yourself for signs and symptoms of illness Caregivers and household members are considered close contacts, should monitor their health, and will be asked to limit movement outside of the home to the extent possible. Follow the monitoring steps for close contacts listed on the symptom monitoring form.    Increase activity slowly  Complete by: As directed      Allergies as of 06/24/2019   No Known Allergies     Medication List    STOP taking these medications   levothyroxine 88 MCG tablet Commonly known as: SYNTHROID     TAKE these medications   acetaminophen 325 MG tablet Commonly known  as: Tylenol Take 2 tablets (650 mg total) by mouth every 6 (six) hours as needed. What changed: Another medication with the same name was removed. Continue taking this medication, and follow the directions you see here.   amLODipine 10 MG tablet Commonly known as: NORVASC Take 1 tablet (10 mg total) by mouth daily. What changed:   medication strength  how much to take   buPROPion 150 MG 24 hr tablet Commonly known as: WELLBUTRIN XL TAKE 1 TABLET EVERY DAY What changed:   how much to take  how to take this  when to take this  additional instructions   Namzaric 28-10 MG Cp24 Generic drug: Memantine HCl-Donepezil HCl Take 1 capsule by mouth daily. Please call and schedule follow up appointment for more refills What changed: when to take this   QUEtiapine 25 MG tablet Commonly known as: SEROQUEL Take 1 tablet (25 mg total) by mouth at bedtime.   venlafaxine XR 75 MG 24 hr capsule Commonly known as: EFFEXOR-XR TAKE 1 CAPSULE EVERY DAY WITH BREAKFAST Must make appointment for more refills What changed:   how much to take  how to take this  when to take this  additional instructions   Vitamin D (Ergocalciferol) 1.25 MG (50000 UNIT) Caps capsule Commonly known as: DRISDOL Take one capsule by mouth every 14 days What changed:   how much to take  how to take this  when to take this  additional instructions       No Known Allergies  Consultations:  None   Procedures/Studies: CT HEAD WO CONTRAST  Result Date: 06/18/2019 CLINICAL DATA:  Fall today.  Head injury EXAM: CT HEAD WITHOUT CONTRAST TECHNIQUE: Contiguous axial images were obtained from the base of the skull through the vertex without intravenous contrast. COMPARISON:  CT head 06/14/2019 FINDINGS: Brain: Moderate to advanced atrophy. Extensive chronic microvascular ischemic change in the white matter. Negative for acute infarct or hemorrhage. 1 cm calcific meningioma in the left posterior fossa  unchanged. Vascular: Negative for hyperdense vessel Skull: Negative for skull fracture. Moderately large left parietal scalp hematoma. Sinuses/Orbits: Paranasal sinuses clear. Bilateral cataract extraction. Other: None IMPRESSION: No acute intracranial abnormality. Prominent left parietal scalp hematoma. Electronically Signed   By: Franchot Gallo M.D.   On: 06/18/2019 22:16   CT Head Wo Contrast  Result Date: 06/14/2019 CLINICAL DATA:  Encephalopathy. Generalized weakness. EXAM: CT HEAD WITHOUT CONTRAST TECHNIQUE: Contiguous axial images were obtained from the base of the skull through the vertex without intravenous contrast. COMPARISON:  11/16/2018 FINDINGS: Brain: No intracranial hemorrhage, mass effect, or midline shift. Generalized atrophy. No hydrocephalus. The basilar cisterns are patent. Moderate to advanced chronic small vessel ischemia. No evidence of territorial infarct or acute ischemia. Stable calcified left posterior fossa wall 10 x 7 mm meningioma. No extra-axial or intracranial fluid collection. Vascular: Atherosclerosis of skullbase vasculature without hyperdense vessel or abnormal calcification. Skull: No fracture or focal lesion. Sinuses/Orbits: Paranasal sinuses and mastoid air cells are clear. The visualized orbits are unremarkable. Bilateral cataract resection. Other: None. IMPRESSION: 1. No acute intracranial abnormality. 2. Stable atrophy and chronic small vessel ischemia. 3. Stable small left posterior fossa meningioma. Electronically Signed  By: Keith Rake M.D.   On: 06/14/2019 19:09   MR BRAIN WO CONTRAST  Result Date: 06/15/2019 CLINICAL DATA:  Focal neuro deficit. Rule out stroke. Alzheimer's, COVID positive history. EXAM: MRI HEAD WITHOUT CONTRAST TECHNIQUE: Multiplanar, multiecho pulse sequences of the brain and surrounding structures were obtained without intravenous contrast. COMPARISON:  CT head 06/14/2019 FINDINGS: Brain: Motion degraded study. Moderate to advanced  atrophy.  Negative for hydrocephalus. Negative for acute infarct. Moderate to advanced chronic microvascular ischemic changes in the white matter bilaterally. Negative for hemorrhage. 10 mm calcific mass posterior to the left cerebellum compatible with meningioma. No cerebral edema is present. No change from 2013 MRI. Vascular: Normal arterial flow voids. Skull and upper cervical spine: No focal skeletal lesion. Sinuses/Orbits: Paranasal sinuses clear. Bilateral cataract extraction Other: None IMPRESSION: Motion degraded study Moderate to advanced atrophy with extensive chronic microvascular ischemia No acute infarct 1 cm calcific meningioma in the left posterior fossa appears unchanged from 2013. Electronically Signed   By: Franchot Gallo M.D.   On: 06/15/2019 11:23   DG Chest Port 1 View  Result Date: 06/14/2019 CLINICAL DATA:  Weakness and falling EXAM: PORTABLE CHEST 1 VIEW COMPARISON:  11/16/2018 FINDINGS: Artifact overlies the chest. Heart size is normal. Chronic aortic atherosclerosis. Lungs are clear allowing for the AP semi-erect positioning. No evidence of heart failure or effusion. Chronic spinal curvature. IMPRESSION: No active disease.  Aortic atherosclerosis. Electronically Signed   By: Nelson Chimes M.D.   On: 06/14/2019 19:15       Subjective: Patient seen and examined at the bedside this morning.  Hemodynamically stable for discharge today.  Discharge Exam: Vitals:   06/23/19 2019 06/24/19 0538  BP: 138/82 127/73  Pulse: 81 67  Resp: 16 16  Temp: 97.7 F (36.5 C) 98 F (36.7 C)  SpO2: 100% 98%   Vitals:   06/23/19 1435 06/23/19 1437 06/23/19 2019 06/24/19 0538  BP: 126/73  138/82 127/73  Pulse: 72  81 67  Resp: 12  16 16   Temp:  98.7 F (37.1 C) 97.7 F (36.5 C) 98 F (36.7 C)  TempSrc:  Axillary Oral Oral  SpO2: 97%  100% 98%  Weight:      Height:        General: Pt is alert, awake, not in acute distress Cardiovascular: RRR, S1/S2 +, no rubs, no  gallops Respiratory: CTA bilaterally, no wheezing, no rhonchi Abdominal: Soft, NT, ND, bowel sounds + Extremities: no edema, no cyanosis    The results of significant diagnostics from this hospitalization (including imaging, microbiology, ancillary and laboratory) are listed below for reference.     Microbiology: Recent Results (from the past 240 hour(s))  Urine Culture     Status: Abnormal   Collection Time: 06/14/19  9:57 PM   Specimen: Urine, Random  Result Value Ref Range Status   Specimen Description   Final    URINE, RANDOM Performed at [facility] [address], [place], [place] [address]    Special Requests   Final    NONE Performed at [facility], [place] [address], [place], [place] [address]    Culture >=100,000 COLONIES/mL PROTEUS MIRABILIS (A)  Final   Report Status 06/17/2019 FINAL  Final   Organism ID, Bacteria PROTEUS MIRABILIS (A)  Final      Susceptibility   Proteus mirabilis - MIC*    AMPICILLIN <=2 SENSITIVE Sensitive     CEFAZOLIN <=4 SENSITIVE Sensitive     CEFTRIAXONE <=1 SENSITIVE  Sensitive     CIPROFLOXACIN <=0.25 SENSITIVE Sensitive     GENTAMICIN <=1 SENSITIVE Sensitive     IMIPENEM 1 SENSITIVE Sensitive     NITROFURANTOIN 256 RESISTANT Resistant     TRIMETH/SULFA <=20 SENSITIVE Sensitive     AMPICILLIN/SULBACTAM <=2 SENSITIVE Sensitive     * >=100,000 COLONIES/mL PROTEUS MIRABILIS  Respiratory Panel by RT PCR (Flu A&B, Covid) - Nasopharyngeal Swab     Status: Abnormal   Collection Time: 06/14/19 11:11 PM   Specimen: Nasopharyngeal Swab  Result Value Ref Range Status   SARS Coronavirus 2 by RT PCR POSITIVE (A) NEGATIVE Final    Comment: RESULT CALLED TO, READ BACK BY AND VERIFIED WITH: BULLOCK,A @ [id number] ON [id number] BY POTEAT,S (NOTE) SARS-CoV-2 target nucleic acids are DETECTED. SARS-CoV-2 RNA is generally detectable in upper respiratory specimens  during the acute phase of infection. Positive results are  indicative of the presence of the identified virus, but do not rule out bacterial infection or co-infection with other pathogens not detected by the test. Clinical correlation with patient history and other diagnostic information is necessary to determine patient infection status. The expected result is Negative. Fact Sheet for Patients:  PinkCheek.be Fact Sheet for Healthcare Providers: GravelBags.it This test is not yet approved or cleared by the [place] FDA and  has been authorized for detection and/or diagnosis of SARS-CoV-2 by FDA under an Emergency Use Authorization (EUA).  This EUA will remain in effect (meaning this test can be used ) for the duration of  the COVID-19 declaration under Section 564(b)(1) of the Act, 21 U.S.C. section 360bbb-3(b)(1), unless the authorization is terminated or revoked sooner.    Influenza A by PCR NEGATIVE NEGATIVE Final   Influenza B by PCR NEGATIVE NEGATIVE Final    Comment: (NOTE) The Xpert Xpress SARS-CoV-2/FLU/RSV assay is intended as an aid in  the diagnosis of influenza from Nasopharyngeal swab specimens and  should not be used as a sole basis for treatment. Nasal washings and  aspirates are unacceptable for Xpert Xpress SARS-CoV-2/FLU/RSV  testing. Fact Sheet for Patients: PinkCheek.be Fact Sheet for Healthcare Providers: GravelBags.it This test is not yet approved or cleared by the [place] FDA and  has been authorized for detection and/or diagnosis of SARS-CoV-2 by  FDA under an Emergency Use Authorization (EUA). This EUA will remain  in effect (meaning this test can be used) for the duration of the  Covid-19 declaration under Section 564(b)(1) of the Act, 21  U.S.C. section 360bbb-3(b)(1), unless the authorization is  terminated or revoked. Performed at [facility], [place] [address] [place], [place] [address]   Culture, blood (routine x 2)     Status: None   Collection Time: 06/15/19  2:25 AM   Specimen: BLOOD  Result Value Ref Range Status   Specimen Description   Final    BLOOD LEFT ANTECUBITAL Performed at [facility] [address] [place], [place] [address]    Special Requests   Final    BOTTLES DRAWN AEROBIC AND ANAEROBIC Blood Culture adequate volume Performed at [facility] [address], [place], [place] [address]    Culture   Final    NO GROWTH 5 DAYS Performed at [facility], [place] [address] [place], [place] [address]    Report Status 06/20/2019 FINAL  Final  Culture, blood (routine x 2)     Status: None   Collection Time: 06/15/19  2:30 AM   Specimen: BLOOD  Result Value Ref Range Status   Specimen Description   Final    BLOOD RIGHT ANTECUBITAL Performed at [facility] [address], [place], [place] [address]    Special Requests   Final    BOTTLES DRAWN AEROBIC AND ANAEROBIC Blood Culture results may not be optimal due to an inadequate volume of blood received in culture bottles Performed at [facility] [address] [place], [place] [address]    Culture   Final    NO GROWTH 5 DAYS Performed at [facility], [place] [address], [place], [place] [address]    Report Status 06/20/2019 FINAL  Final     Labs: BNP (last 3 results) Recent Labs    11/16/18 1553  BNP 0000000   Basic Metabolic Panel: Recent Labs  Lab 06/18/19 0418 06/19/19 0544 06/22/19 0328  NA 142 140 137  K 3.6 3.3* 3.6  CL 108 105 103  CO2 25 27 23   GLUCOSE 108* 99 123*  BUN 29* 25* 35*  CREATININE 0.88 0.84 1.00  CALCIUM 8.6* 8.7* 8.7*   Liver Function Tests: Recent Labs  Lab 06/18/19 0418 06/19/19 0544  AST 31 44*  ALT 22 32  ALKPHOS 85 88  BILITOT 0.6 0.8  PROT 6.5 6.7  ALBUMIN 3.7 3.8   No results for input(s): LIPASE, AMYLASE in the last 168  hours. No results for input(s): AMMONIA in the last 168 hours. CBC: Recent Labs  Lab 06/18/19 0418 06/18/19 2314 06/19/19 0544  WBC 7.0 7.1 8.9  HGB 13.7 13.7 14.3  HCT 40.9 41.9 42.2  MCV 99.8 102.9* 101.0*  PLT 215 235 216   Cardiac Enzymes: No results for input(s): CKTOTAL, CKMB, CKMBINDEX, TROPONINI in the last 168 hours. BNP: Invalid input(s): POCBNP CBG: Recent Labs  Lab 06/18/19 2227  GLUCAP 104*   D-Dimer No results for input(s): DDIMER in the last 72 hours. Hgb A1c No results for input(s): HGBA1C in the last 72 hours. Lipid Profile No results for input(s): CHOL, HDL, LDLCALC, TRIG, CHOLHDL, LDLDIRECT in the last 72 hours. Thyroid function studies No results for input(s): TSH, T4TOTAL, T3FREE, THYROIDAB in the last 72 hours.  Invalid input(s): FREET3 Anemia work up No results for input(s): VITAMINB12, FOLATE, FERRITIN, TIBC, IRON, RETICCTPCT in the last 72 hours. Urinalysis    Component Value Date/Time   COLORURINE YELLOW 06/14/2019 2157   APPEARANCEUR TURBID (A) 06/14/2019 2157   LABSPEC 1.014 06/14/2019 2157   PHURINE 8.0 06/14/2019 2157   GLUCOSEU NEGATIVE 06/14/2019 2157   HGBUR NEGATIVE 06/14/2019 2157   BILIRUBINUR NEGATIVE 06/14/2019 2157   BILIRUBINUR neg 05/26/2019 1646   KETONESUR NEGATIVE 06/14/2019 2157   PROTEINUR 100 (A) 06/14/2019 2157   UROBILINOGEN 0.2 05/26/2019 1646   UROBILINOGEN 0.2 12/19/2014 2002   NITRITE NEGATIVE 06/14/2019 2157   LEUKOCYTESUR LARGE (A) 06/14/2019 2157   Sepsis Labs Invalid input(s): PROCALCITONIN,  WBC,  LACTICIDVEN Microbiology Recent Results (from the past 240 hour(s))  Urine Culture     Status: Abnormal   Collection Time: 06/14/19  9:57 PM   Specimen: Urine, Random  Result Value Ref Range Status   Specimen Description   Final    URINE, RANDOM Performed at [facility], [place] [address] [place], [place] [address]    Special Requests   Final    NONE Performed at [facility], [place] [address], [place], [place] [address]    Culture >=100,000 COLONIES/mL PROTEUS MIRABILIS (A)  Final  Report Status 06/17/2019 FINAL  Final   Organism ID, Bacteria PROTEUS MIRABILIS (A)  Final      Susceptibility   Proteus mirabilis - MIC*    AMPICILLIN <=2 SENSITIVE Sensitive     CEFAZOLIN <=4 SENSITIVE Sensitive     CEFTRIAXONE <=1 SENSITIVE Sensitive     CIPROFLOXACIN <=0.25 SENSITIVE Sensitive     GENTAMICIN <=1 SENSITIVE Sensitive     IMIPENEM 1 SENSITIVE Sensitive     NITROFURANTOIN 256 RESISTANT Resistant     TRIMETH/SULFA <=20 SENSITIVE Sensitive     AMPICILLIN/SULBACTAM <=2 SENSITIVE Sensitive     * >=100,000 COLONIES/mL PROTEUS MIRABILIS  Respiratory Panel by RT PCR (Flu A&B, Covid) - Nasopharyngeal Swab     Status: Abnormal   Collection Time: 06/14/19 11:11 PM   Specimen: Nasopharyngeal Swab  Result Value Ref Range Status   SARS Coronavirus 2 by RT PCR POSITIVE (A) NEGATIVE Final    Comment: RESULT CALLED TO, READ BACK BY AND VERIFIED WITH: BULLOCK,A @ [id number] ON [id number] BY POTEAT,S (NOTE) SARS-CoV-2 target nucleic acids are DETECTED. SARS-CoV-2 RNA is generally detectable in upper respiratory specimens  during the acute phase of infection. Positive results are indicative of the presence of the identified virus, but do not rule out bacterial infection or co-infection with other pathogens not detected by the test. Clinical correlation with patient history and other diagnostic information is necessary to determine patient infection status. The expected result is Negative. Fact Sheet for Patients:  PinkCheek.be Fact Sheet for Healthcare Providers: GravelBags.it This test is not yet approved or cleared by the [place] FDA and  has been authorized for detection and/or diagnosis of SARS-CoV-2 by FDA under an Emergency Use Authorization (EUA).  This EUA will remain in effect (meaning this  test can be used ) for the duration of  the COVID-19 declaration under Section 564(b)(1) of the Act, 21 U.S.C. section 360bbb-3(b)(1), unless the authorization is terminated or revoked sooner.    Influenza A by PCR NEGATIVE NEGATIVE Final   Influenza B by PCR NEGATIVE NEGATIVE Final    Comment: (NOTE) The Xpert Xpress SARS-CoV-2/FLU/RSV assay is intended as an aid in  the diagnosis of influenza from Nasopharyngeal swab specimens and  should not be used as a sole basis for treatment. Nasal washings and  aspirates are unacceptable for Xpert Xpress SARS-CoV-2/FLU/RSV  testing. Fact Sheet for Patients: PinkCheek.be Fact Sheet for Healthcare Providers: GravelBags.it This test is not yet approved or cleared by the [place] FDA and  has been authorized for detection and/or diagnosis of SARS-CoV-2 by  FDA under an Emergency Use Authorization (EUA). This EUA will remain  in effect (meaning this test can be used) for the duration of the  Covid-19 declaration under Section 564(b)(1) of the Act, 21  U.S.C. section 360bbb-3(b)(1), unless the authorization is  terminated or revoked. Performed at [facility], [place] [address], [place], [place] [address]   Culture, blood (routine x 2)     Status: None   Collection Time: 06/15/19  2:25 AM   Specimen: BLOOD  Result Value Ref Range Status   Specimen Description   Final    BLOOD LEFT ANTECUBITAL Performed at [place] [address] [place], [place] [address]    Special Requests   Final    BOTTLES DRAWN AEROBIC AND ANAEROBIC Blood Culture adequate volume Performed at [place] [address], [place], [place] [address]    Culture   Final    NO  GROWTH 5 DAYS Performed at [facility], [place] [address] [place], [place] [address]    Report Status 06/20/2019 FINAL  Final  Culture, blood (routine x 2)     Status: None    Collection Time: 06/15/19  2:30 AM   Specimen: BLOOD  Result Value Ref Range Status   Specimen Description   Final    BLOOD RIGHT ANTECUBITAL Performed at [place] [address], [place], [place] [address]    Special Requests   Final    BOTTLES DRAWN AEROBIC AND ANAEROBIC Blood Culture results may not be optimal due to an inadequate volume of blood received in culture bottles Performed at [place] [address] [place], [place] [address]    Culture   Final    NO GROWTH 5 DAYS Performed at [facility], [place] [address] [place],  [address]    Report Status 06/20/2019 FINAL  Final    Please note: You were cared for by a hospitalist during your hospital stay. Once you are discharged, your primary care physician will handle any further medical issues. Please note that NO REFILLS for any discharge medications will be authorized once you are discharged, as it is imperative that you return to your primary care physician (or establish a relationship with a primary care physician if you do not have one) for your post hospital discharge needs so that they can reassess your need for medications and monitor your lab values.    Time coordinating discharge: 40 minutes  SIGNED:   Shelly Coss, MD  [facility] 06/24/2019, 1:51 PM Pager [phone]  If 7PM-7AM, please contact night-coverage [email or web address] Password [email or web address]

## 2019-06-24 NOTE — Progress Notes (Signed)
Report called to [place] at [facility]. Attempted to call daughter [name] to update- no answer.

## 2019-06-24 NOTE — TOC Progression Note (Addendum)
Transition of Care [facility]) - Progression Note    Patient Details  Name: [name] MRN: [id number] Date of Birth: 08/09/1931  Transition of Care [facility]) CM/SW Contact  Purcell Mouton, RN Phone Number: 06/24/2019, 1:33 PM  Clinical Narrative:    Waiting for insurance authorization for [facility]. Admission coordinator started insurance authorization this am. Pt is not Navi.  Daughter [name] [phone] was called to make her aware. Daughter [name] asked if someone will call her before pt is transported.   Expected Discharge Plan: Skilled Nursing Facility Barriers to Discharge: Continued Medical Work up  Expected Discharge Plan and Services Expected Discharge Plan: [place]                                               Social Determinants of Health (SDOH) Interventions    Readmission Risk Interventions No flowsheet data found.

## 2019-06-24 NOTE — TOC Transition Note (Signed)
Transition of Care [facility]) - CM/SW Discharge Note   Patient Details  Name: [name] MRN: [id number] Date of Birth: 06/06/1931  Transition of Care [facility]) CM/SW Contact:  Ross Ludwig, LCSW Phone Number: 06/24/2019, 3:49 PM   Clinical Narrative:     Patient to be d/c'ed today to [facility].  Patient and family agreeable to plans will transport via ems RN to call report.  CSW updated patient's daughter [name] [phone] and she is aware that patient is discharging today.     Final next level of care: Skilled Nursing Facility Barriers to Discharge: Barriers Resolved   Patient Goals and CMS Choice Patient states their goals for this hospitalization and ongoing recovery are:: To go to SNF for short term rehab, then return back home. CMS Medicare.gov Compare Post Acute Care list provided to:: Patient Represenative (must comment) Choice offered to / list presented to : Adult Children  Discharge Placement PASRR number recieved: 06/17/19            Patient [facility] bed at: [facility] Patient to be transferred to facility by: [facility] Name of family member notified: Daughter [name] [phone] Patient and family notified of of transfer: 06/24/19  Discharge Plan and Services                  DME Agency: NA       HH Arranged: NA          Social Determinants of Health (SDOH) Interventions     Readmission Risk Interventions No flowsheet data found.

## 2019-06-24 NOTE — Care Management Important Message (Signed)
Important Message  Patient Details IM Letter given to Gabriel Earing RN Case Manager to present to the Patient Name: [name] MRN: [id number] Date of Birth: Jul 01, 1931   Medicare Important Message Given:  Yes     Kerin Salen 06/24/2019, 10:51 AM

## 2019-06-24 NOTE — Progress Notes (Signed)
PROGRESS NOTE    [name]  [id number] DOB: 10-08-1931 DOA: 06/14/2019 PCP: [name], NP   Brief Narrative: This 86 year old female with significant medical history of Alzheimer's dementia GERD, depression, hypertension, hyperlipidemia, vitamin D deficiency presented to the ED with her daughter due to concerns of rapid onset of weakness and difficulty with ambulation.Patient has received 1 dose of her COVID-19 vaccine .Patient was evaluated at her primary care provider's office on the day of 4/27 and the patient was advised to go to [facility] emergency department for further evaluation. In the ED, CT head did not show any acute findings, she was suspected to have UTI and further work-up initiated.She was also found to be positive for Covid.   Her hospital course treatment stable.  Currently she is maintaining her saturation on room air.  She completed the course of remdesivir and almost completing the course of Decadron.  She has been seen by PT and OT and recommended skilled nursing facility Patient is medically stable for discharge to skilled nursing facility as soon as bed is available. Assessment & Plan:   Principal Problem:   Complicated UTI (urinary tract infection) Active Problems:   Hypothyroidism   Essential hypertension   Generalized weakness   Alzheimer's dementia with behavioral disturbance ([place])   COVID-19 virus infection   Rapidly worsening weakness/motor dysfunction,difficult ambulation:Suspecting multifactorialsecondary to UTI, COVID-19 infection.CT head did not show any acute intracranial normalities.  MRI brain showed moderate to advanced atrophy with extensive microvascular ischemia, no acute infarct, 1 cm calcified meningioma in the left posterior fossa which is unchanged from last imaging. Patient seen by OT and PT had recommended skilled nursing facility.  Complicated UTI:UA [id number], many bacteria, leukocytes large.urine culture more than  100,000 Proteus, she was  on cefazolin. Blood culture no growth so far. She completed Abx course.  COVID-19 virus infection:X-ray is unremarkable and she is not hypoxic.Inflammatory markers are relatively stable . Patient has a significant weakness which could be contributed by her COVID-19 infectionas well. She completed remdesivirand  Decadron.  Hypokalemia: Supplemented and corrected.  Hypothyroidism:She is on Synthyroid at home.  Currently her TSH is low and her T4 is high so Synthyroid discontinued.  Essential hypertension: Pressure is  well controlled,continue current meds.   Alzheimer's dementia with behavioral disturbance:continue Namenda, Aricept and venlafaxine and bupropion.On admission needed to be placed on mittens. Continue supportive care. Currently on  low-dose Seroquel.  Sitter discontinued   Nutrition Problem: Inadequate oral intake Etiology: acute illness, chronic illness(complicated UTI, [id number] virus infection; Alzheimer's dementia)      DVT prophylaxis:Lovenox Code Status: DNR Family Communication: Called daughter on phone on 06/20/19 Status is: Inpatient  Remains inpatient appropriate because:Unsafe d/c plan   Dispo: The patient is from: Home              Anticipated d/c is to: SNF              Anticipated d/c date is: As soon as bed is available              Patient currently is medically stable to d/c.   Consultants: None  Procedures:None  Antimicrobials:  Anti-infectives (From admission, onward)   Start     Dose/Rate Route Frequency Ordered Stop   06/17/19 2200  ceFAZolin (ANCEF) IVPB 1 g/50 mL premix  Status:  Discontinued     1 g 100 mL/hr over 30 Minutes Intravenous Every 12 hours 06/17/19 1231 06/21/19 1351   06/16/19 1000  remdesivir  100 mg in sodium chloride 0.9 % 100 mL IVPB     100 mg 200 mL/hr over 30 Minutes Intravenous Daily 06/15/19 1422 06/19/19 1030   06/15/19 2300  cefTRIAXone (ROCEPHIN) 1 g in sodium chloride 0.9 % 100  mL IVPB  Status:  Discontinued     1 g 200 mL/hr over 30 Minutes Intravenous Every 24 hours 06/15/19 0248 06/17/19 1231   06/15/19 1530  remdesivir 100 mg in sodium chloride 0.9 % 100 mL IVPB     100 mg 200 mL/hr over 30 Minutes Intravenous  Once 06/15/19 1422 06/15/19 1720   06/15/19 1430  remdesivir 100 mg in sodium chloride 0.9 % 100 mL IVPB     100 mg 200 mL/hr over 30 Minutes Intravenous  Once 06/15/19 1422 06/15/19 1719   06/14/19 2330  fosfomycin (MONUROL) packet 3 g  Status:  Discontinued     3 g Oral  Once 06/14/19 2247 06/14/19 2310   06/14/19 2315  cefTRIAXone (ROCEPHIN) 1 g in sodium chloride 0.9 % 100 mL IVPB     1 g 200 mL/hr over 30 Minutes Intravenous  Once 06/14/19 2310 06/15/19 0101      Subjective: Patient seen and examined at the bedside this morning. Hemodynamically  stable.  No acute issues.  Waiting for skilled nursing  facility bed.  Objective: Vitals:   06/23/19 1435 06/23/19 1437 06/23/19 2019 06/24/19 0538  BP: 126/73  138/82 127/73  Pulse: 72  81 67  Resp: 12  16 16   Temp:  98.7 F (37.1 C) 97.7 F (36.5 C) 98 F (36.7 C)  TempSrc:  Axillary Oral Oral  SpO2: 97%  100% 98%  Weight:      Height:        Intake/Output Summary (Last 24 hours) at 06/24/2019 1313 Last data filed at 06/24/2019 1035 Gross per 24 hour  Intake 590 ml  Output 600 ml  Net -10 ml   Filed Weights   06/17/19 1712  Weight: 55.9 kg    Examination:   General exam: comfortable,elderly female HEENT:PERRL,Oral mucosa moist, Ear/Nose normal on gross exam Respiratory system: Bilateral equal air entry, normal vesicular breath sounds, no wheezes or crackles  Cardiovascular system: S1 & S2 heard, RRR. No JVD, murmurs, rubs, gallops or clicks. Gastrointestinal system: Abdomen is nondistended, soft and nontender. No organomegaly or masses felt. Normal bowel sounds heard. Central nervous system: Alert but not oriented. Extremities: No edema, no clubbing ,no cyanosis Skin: No  rashes, lesions or ulcers,no icterus ,no pallor   Data Reviewed: I have personally reviewed following labs and imaging studies  CBC: Recent Labs  Lab 06/18/19 0418 06/18/19 2314 06/19/19 0544  WBC 7.0 7.1 8.9  HGB 13.7 13.7 14.3  HCT 40.9 41.9 42.2  MCV 99.8 102.9* 101.0*  PLT 215 235 [id number]   Basic Metabolic Panel: Recent Labs  Lab 06/18/19 0418 06/19/19 0544 06/22/19 0328  NA 142 140 137  K 3.6 3.3* 3.6  CL 108 105 103  CO2 25 27 23   GLUCOSE 108* 99 123*  BUN 29* 25* 35*  CREATININE 0.88 0.84 1.00  CALCIUM 8.6* 8.7* 8.7*   GFR: Estimated Creatinine Clearance: 34.9 mL/min (by C-G formula based on SCr of 1 mg/dL). Liver Function Tests: Recent Labs  Lab 06/18/19 0418 06/19/19 0544  AST 31 44*  ALT 22 32  ALKPHOS 85 88  BILITOT 0.6 0.8  PROT 6.5 6.7  ALBUMIN 3.7 3.8   No results for input(s): LIPASE, AMYLASE in the last 168  hours. No results for input(s): AMMONIA in the last 168 hours. Coagulation Profile: No results for input(s): INR, PROTIME in the last 168 hours. Cardiac Enzymes: No results for input(s): CKTOTAL, CKMB, CKMBINDEX, TROPONINI in the last 168 hours. BNP (last 3 results) No results for input(s): PROBNP in the last 8760 hours. HbA1C: No results for input(s): HGBA1C in the last 72 hours. CBG: Recent Labs  Lab 06/18/19 2227  GLUCAP 104*   Lipid Profile: No results for input(s): CHOL, HDL, LDLCALC, TRIG, CHOLHDL, LDLDIRECT in the last 72 hours. Thyroid Function Tests: No results for input(s): TSH, T4TOTAL, FREET4, T3FREE, THYROIDAB in the last 72 hours. Anemia Panel: No results for input(s): VITAMINB12, FOLATE, FERRITIN, TIBC, IRON, RETICCTPCT in the last 72 hours. Sepsis Labs: No results for input(s): PROCALCITON, LATICACIDVEN in the last 168 hours.  Recent Results (from the past 240 hour(s))  Urine Culture     Status: Abnormal   Collection Time: 06/14/19  9:57 PM   Specimen: Urine, Random  Result Value Ref Range Status   Specimen  Description   Final    URINE, RANDOM Performed at [facility] [address], [place], [place] [address]    Special Requests   Final    NONE Performed at [facility], [place] [address] [place],  [address]    Culture >=100,000 COLONIES/mL PROTEUS MIRABILIS (A)  Final   Report Status 06/17/2019 FINAL  Final   Organism ID, Bacteria PROTEUS MIRABILIS (A)  Final      Susceptibility   Proteus mirabilis - MIC*    AMPICILLIN <=2 SENSITIVE Sensitive     CEFAZOLIN <=4 SENSITIVE Sensitive     CEFTRIAXONE <=1 SENSITIVE Sensitive     CIPROFLOXACIN <=0.25 SENSITIVE Sensitive     GENTAMICIN <=1 SENSITIVE Sensitive     IMIPENEM 1 SENSITIVE Sensitive     NITROFURANTOIN 256 RESISTANT Resistant     TRIMETH/SULFA <=20 SENSITIVE Sensitive     AMPICILLIN/SULBACTAM <=2 SENSITIVE Sensitive     * >=100,000 COLONIES/mL PROTEUS MIRABILIS  Respiratory Panel by RT PCR (Flu A&B, Covid) - Nasopharyngeal Swab     Status: Abnormal   Collection Time: 06/14/19 11:11 PM   Specimen: Nasopharyngeal Swab  Result Value Ref Range Status   SARS Coronavirus 2 by RT PCR POSITIVE (A) NEGATIVE Final    Comment: RESULT CALLED TO, READ BACK BY AND VERIFIED WITH: BULLOCK,A @ [id number] ON [id number] BY POTEAT,S (NOTE) SARS-CoV-2 target nucleic acids are DETECTED. SARS-CoV-2 RNA is generally detectable in upper respiratory specimens  during the acute phase of infection. Positive results are indicative of the presence of the identified virus, but do not rule out bacterial infection or co-infection with other pathogens not detected by the test. Clinical correlation with patient history and other diagnostic information is necessary to determine patient infection status. The expected result is Negative. Fact Sheet for Patients:  PinkCheek.be Fact Sheet for Healthcare Providers: GravelBags.it This test is not yet approved or cleared by  the [place] FDA and  has been authorized for detection and/or diagnosis of SARS-CoV-2 by FDA under an Emergency Use Authorization (EUA).  This EUA will remain in effect (meaning this test can be used ) for the duration of  the COVID-19 declaration under Section 564(b)(1) of the Act, 21 U.S.C. section 360bbb-3(b)(1), unless the authorization is terminated or revoked sooner.    Influenza A by PCR NEGATIVE NEGATIVE Final   Influenza B by PCR NEGATIVE NEGATIVE Final    Comment: (NOTE) The Xpert Xpress  SARS-CoV-2/FLU/RSV assay is intended as an aid in  the diagnosis of influenza from Nasopharyngeal swab specimens and  should not be used as a sole basis for treatment. Nasal washings and  aspirates are unacceptable for Xpert Xpress SARS-CoV-2/FLU/RSV  testing. Fact Sheet for Patients: PinkCheek.be Fact Sheet for Healthcare Providers: GravelBags.it This test is not yet approved or cleared by the [place] FDA and  has been authorized for detection and/or diagnosis of SARS-CoV-2 by  FDA under an Emergency Use Authorization (EUA). This EUA will remain  in effect (meaning this test can be used) for the duration of the  Covid-19 declaration under Section 564(b)(1) of the Act, 21  U.S.C. section 360bbb-3(b)(1), unless the authorization is  terminated or revoked. Performed at [facility], [place] [address] [place], [place] [address]   Culture, blood (routine x 2)     Status: None   Collection Time: 06/15/19  2:25 AM   Specimen: BLOOD  Result Value Ref Range Status   Specimen Description   Final    BLOOD LEFT ANTECUBITAL Performed at [place] [address] [place], [place] [address]    Special Requests   Final    BOTTLES DRAWN AEROBIC AND ANAEROBIC Blood Culture adequate volume Performed at [facility] [address], [place], [place] [address]    Culture    Final    NO GROWTH 5 DAYS Performed at [facility], [place] [address], [place], [place] [address]    Report Status 06/20/2019 FINAL  Final  Culture, blood (routine x 2)     Status: None   Collection Time: 06/15/19  2:30 AM   Specimen: BLOOD  Result Value Ref Range Status   Specimen Description   Final    BLOOD RIGHT ANTECUBITAL Performed at [place] [address] [place], [place] [address]    Special Requests   Final    BOTTLES DRAWN AEROBIC AND ANAEROBIC Blood Culture results may not be optimal due to an inadequate volume of blood received in culture bottles Performed at [place] [facility] [address], [place], [place] [address]    Culture   Final    NO GROWTH 5 DAYS Performed at [facility], [place] [address], [place], [place] [address]    Report Status 06/20/2019 FINAL  Final         Radiology Studies: No results found.      Scheduled Meds: . amLODipine  10 mg Oral Daily  . buPROPion  150 mg Oral QHS  . dexamethasone (DECADRON) injection  6 mg Intravenous Daily  . memantine  28 mg Oral QHS   And  . donepezil  10 mg Oral QHS  . enoxaparin (LOVENOX) injection  40 mg Subcutaneous Q24H  . feeding supplement (ENSURE ENLIVE)  237 mL Oral BID BM  . QUEtiapine  25 mg Oral QHS  . venlafaxine XR  75 mg Oral Q breakfast   Continuous Infusions: . sodium chloride 250 mL (06/20/19 2022)     LOS: 9 days    Time spent: 25 mins,More than 50% of that time was spent in counseling and/or coordination of care.      Shelly Coss, MD [facility] P5/08/2019, 1:13 PM

## 2019-06-24 NOTE — Progress Notes (Signed)
Pt pleasant and resting in bed quietly. Ate a few bites of breakfast. No complaints at this time.

## 2019-06-27 DIAGNOSIS — R531 Weakness: Secondary | ICD-10-CM | POA: Diagnosis not present

## 2019-06-27 DIAGNOSIS — U071 COVID-19: Secondary | ICD-10-CM | POA: Diagnosis not present

## 2019-06-27 DIAGNOSIS — N390 Urinary tract infection, site not specified: Secondary | ICD-10-CM | POA: Diagnosis not present

## 2019-06-27 DIAGNOSIS — G301 Alzheimer's disease with late onset: Secondary | ICD-10-CM | POA: Diagnosis not present

## 2019-06-28 DIAGNOSIS — D72829 Elevated white blood cell count, unspecified: Secondary | ICD-10-CM | POA: Diagnosis not present

## 2019-06-28 DIAGNOSIS — U071 COVID-19: Secondary | ICD-10-CM | POA: Diagnosis not present

## 2019-06-28 DIAGNOSIS — N390 Urinary tract infection, site not specified: Secondary | ICD-10-CM | POA: Diagnosis not present

## 2019-06-29 ENCOUNTER — Ambulatory Visit: Payer: Medicare HMO

## 2019-06-29 DIAGNOSIS — K5900 Constipation, unspecified: Secondary | ICD-10-CM | POA: Diagnosis not present

## 2019-06-29 DIAGNOSIS — N390 Urinary tract infection, site not specified: Secondary | ICD-10-CM | POA: Diagnosis not present

## 2019-06-29 DIAGNOSIS — D72829 Elevated white blood cell count, unspecified: Secondary | ICD-10-CM | POA: Diagnosis not present

## 2019-06-30 DIAGNOSIS — N390 Urinary tract infection, site not specified: Secondary | ICD-10-CM | POA: Diagnosis not present

## 2019-06-30 DIAGNOSIS — K5900 Constipation, unspecified: Secondary | ICD-10-CM | POA: Diagnosis not present

## 2019-07-01 DIAGNOSIS — K5900 Constipation, unspecified: Secondary | ICD-10-CM | POA: Diagnosis not present

## 2019-07-01 DIAGNOSIS — D72829 Elevated white blood cell count, unspecified: Secondary | ICD-10-CM | POA: Diagnosis not present

## 2019-07-05 DIAGNOSIS — K5900 Constipation, unspecified: Secondary | ICD-10-CM | POA: Diagnosis not present

## 2019-07-05 DIAGNOSIS — U071 COVID-19: Secondary | ICD-10-CM | POA: Diagnosis not present

## 2019-07-05 DIAGNOSIS — I10 Essential (primary) hypertension: Secondary | ICD-10-CM | POA: Diagnosis not present

## 2019-07-11 DIAGNOSIS — R42 Dizziness and giddiness: Secondary | ICD-10-CM | POA: Diagnosis not present

## 2019-07-11 DIAGNOSIS — K5900 Constipation, unspecified: Secondary | ICD-10-CM | POA: Diagnosis not present

## 2019-07-12 DIAGNOSIS — E876 Hypokalemia: Secondary | ICD-10-CM | POA: Diagnosis not present

## 2019-07-12 DIAGNOSIS — J189 Pneumonia, unspecified organism: Secondary | ICD-10-CM | POA: Diagnosis not present

## 2019-07-12 DIAGNOSIS — R42 Dizziness and giddiness: Secondary | ICD-10-CM | POA: Diagnosis not present

## 2019-07-12 DIAGNOSIS — R799 Abnormal finding of blood chemistry, unspecified: Secondary | ICD-10-CM | POA: Diagnosis not present

## 2019-07-13 DIAGNOSIS — E876 Hypokalemia: Secondary | ICD-10-CM | POA: Diagnosis not present

## 2019-07-13 DIAGNOSIS — J189 Pneumonia, unspecified organism: Secondary | ICD-10-CM | POA: Diagnosis not present

## 2019-07-13 DIAGNOSIS — R799 Abnormal finding of blood chemistry, unspecified: Secondary | ICD-10-CM | POA: Diagnosis not present

## 2019-07-14 DIAGNOSIS — J189 Pneumonia, unspecified organism: Secondary | ICD-10-CM | POA: Diagnosis not present

## 2019-07-14 DIAGNOSIS — N390 Urinary tract infection, site not specified: Secondary | ICD-10-CM | POA: Diagnosis not present

## 2019-07-14 DIAGNOSIS — U071 COVID-19: Secondary | ICD-10-CM | POA: Diagnosis not present

## 2019-07-14 DIAGNOSIS — R531 Weakness: Secondary | ICD-10-CM | POA: Diagnosis not present

## 2019-07-15 ENCOUNTER — Other Ambulatory Visit: Payer: Self-pay

## 2019-07-15 ENCOUNTER — Other Ambulatory Visit: Payer: Medicare HMO | Admitting: Hospice

## 2019-07-15 DIAGNOSIS — F02818 Dementia in other diseases classified elsewhere, unspecified severity, with other behavioral disturbance: Secondary | ICD-10-CM

## 2019-07-15 DIAGNOSIS — F0281 Dementia in other diseases classified elsewhere with behavioral disturbance: Secondary | ICD-10-CM

## 2019-07-15 DIAGNOSIS — Z515 Encounter for palliative care: Secondary | ICD-10-CM | POA: Diagnosis not present

## 2019-07-15 NOTE — Patient Outreach (Signed)
[facility]) Care Management  07/15/2019  [name] 1931-12-09 [id number]  Referral Date: 07/15/19 Referral Source:  [employer] Report Date of Discharge: 07/14/19 Facility: [place]: [facility]    Referral received.  No outreach warranted at this time.  Transition of Care calls being completed via EMMI. RN CM will outreach patient for any red flags received.    Plan: RN CM will close case.   Jone Baseman, RN, MSN [place] Management Care Management Coordinator Direct Line [phone] Cell [phone] Toll Free: [phone]  Fax: [phone]

## 2019-07-15 NOTE — Progress Notes (Signed)
[facility] Pall[facility] Consult Note Telephone: [phone]  Fax: [phone]  PATIENT NAME: [name] DOB: 06/02/1931 MRN: [id number]  PRIMARY CARE PROVIDER:   [name], NP  REFERRING PROVIDER:  [name], NP [place],  [place] [address]   RESPONSIBLE PARTY:Primary Emergency Contact: [name] Address: [address] [place], [place] [address] [place] Phone: [phone] Relation: Daughter Secondary Emergency Contact: [name] Address: [address] [place], [place] [address] [place] Phone: [phone] Relation: Daughter  TELEHEALTH VISIT STATEMENT Due to the COVID-19 crisis, this visit was done via telephonefrom my office. It was initiated and consented to by this patient and/or family.  RECOMMENDATIONS/PLAN:  Advance Care Planning/Goals of Care: Telehealthvisit consisted of building trust andfollow up on palliative care.Amanda DNR.Goals of care include to maximize quality of life and symptom management.  In person visit scheduled for 08/18/2019 for further discussion on goals of care clarification.  Palliative care will continue to support patient,  Family, and the medical team. Symptom management: Patient was hospitalized last month for UTI, treated with Cefazolin; also diagnosed with  COVID-19 infection.  Chart review indicates she completed remdesivir and Decadron.  Upon discharge she went to [facility] for rehab and was discharged home yesterday.  [name] reports patient with no urinary symptoms, no coughing no chest pain, no shortness of breath.  Patient able to ambulate with rolling walker; appetite is good.  Overall patient well recovered, and with no concerns at this time.  Physical therapy is expected to start next week at home. Dementia at baseline fast 6c. Privately paid caregiverscontinue tocome to the house 5  days a week; daughter [name] lives at home with patient.  To provide patient with assistance in ADLs.  [name] affirmed patient is compliantwith her medications. Ongoing care encouraged Follow [id number] care will continue to follow patient for goals of care clarification and symptom management. I spent 47minutes providing this consultation; time includes chart review and documentation.  More than 50% of the time in this consultation was spent on coordinating communication   HISTORY OF PRESENT ILLNESS:Gabrielle M Hawksis a 86 y.o.year oldfemalewith multiple medical problems including Alzhiemer's dementia, frequent falls, PVD, h/o fractures, constipation. Palliative Care was asked to help address goals of care. CODE STATUS: DNR  PPS: 40% HOSPICE ELIGIBILITY/DIAGNOSIS: TBD  PAST MEDICAL HISTORY:  Past Medical History:  Diagnosis Date  . Abnormality of gait   . Alzheimer's disease ([place])   . Calculus of kidney   . Closed fracture of seventh cervical vertebra without mention of spinal cord injury   . Depressive disorder, not elsewhere classified   . Edema   . Edema   . External hemorrhoids without mention of complication   . Gastric ulcer, unspecified as acute or chronic, without mention of hemorrhage, perforation, or obstruction   . GERD (gastroesophageal reflux disease)   . Hypertension   . Insomnia, unspecified   . Loss of weight   . Osteoarthrosis, unspecified whether generalized or localized, unspecified site   . Other protein-calorie malnutrition   . Pain in thoracic spine   . Personal history of fall   . Unspecified constipation   . Unspecified hereditary and idiopathic peripheral neuropathy   . Unspecified hypothyroidism   . Unspecified vitamin D deficiency   . Urinary frequency     SOCIAL HX:  Social History   Tobacco Use  . Smoking status: Never Smoker  . Smokeless tobacco: Never Used  Substance Use Topics  . Alcohol use: No    ALLERGIES: No Known  Allergies   PERTINENT MEDICATIONS:  Outpatient Encounter Medications as of 07/15/2019  Medication Sig  . acetaminophen (TYLENOL) 325 MG tablet Take 2 tablets (650 mg total) by mouth every 6 (six) hours as needed. (Patient not taking: Reported on 06/14/2019)  . amLODipine (NORVASC) 10 MG tablet Take 1 tablet (10 mg total) by mouth daily.  Marland Kitchen buPROPion (WELLBUTRIN XL) 150 MG 24 hr tablet TAKE 1 TABLET EVERY DAY (Patient taking differently: Take 150 mg by mouth at bedtime. )  . Memantine HCl-Donepezil HCl (NAMZARIC) 28-10 MG CP24 Take 1 capsule by mouth daily. Please call and schedule follow up appointment for more refills (Patient taking differently: Take 1 capsule by mouth at bedtime. Please call and schedule follow up appointment for more refills)  . QUEtiapine (SEROQUEL) 25 MG tablet Take 1 tablet (25 mg total) by mouth at bedtime.  Marland Kitchen venlafaxine XR (EFFEXOR-XR) 75 MG 24 hr capsule TAKE 1 CAPSULE EVERY DAY WITH BREAKFAST Must make appointment for more refills (Patient taking differently: Take 75 mg by mouth daily with breakfast. Must make appointment for more refills)  . Vitamin D, Ergocalciferol, (DRISDOL) 1.25 MG (50000 UT) CAPS capsule Take one capsule by mouth every 14 days (Patient taking differently: Take 50,000 Units by mouth every 14 (fourteen) days. )   No facility-administered encounter medications on file as of 07/15/2019.   Teodoro Spray, NP

## 2019-07-19 ENCOUNTER — Telehealth: Payer: Self-pay | Admitting: *Deleted

## 2019-07-19 NOTE — Telephone Encounter (Signed)
Patient daughter, [name] called and stated that patient was in the hospital and then released to [facility] rehab for 2 1/2 weeks. Patient came home on Thursday and was constipated and they gave patient some Miralax on Friday and she started having bowel movements. Daughter is calling because the bowel movements that patient is having are black and today she saw some spotting of blood.   I offered daughter an appointment to have patient evaluated but she refused and stated that she has to call back after speaking with the sister that does the transportation. Stated that she will call back to schedule an appointment.

## 2019-07-20 DIAGNOSIS — S52501D Unspecified fracture of the lower end of right radius, subsequent encounter for closed fracture with routine healing: Secondary | ICD-10-CM | POA: Diagnosis not present

## 2019-07-20 DIAGNOSIS — G629 Polyneuropathy, unspecified: Secondary | ICD-10-CM | POA: Diagnosis not present

## 2019-07-20 DIAGNOSIS — I739 Peripheral vascular disease, unspecified: Secondary | ICD-10-CM

## 2019-07-20 DIAGNOSIS — G309 Alzheimer's disease, unspecified: Secondary | ICD-10-CM | POA: Diagnosis not present

## 2019-07-20 DIAGNOSIS — R1311 Dysphagia, oral phase: Secondary | ICD-10-CM | POA: Diagnosis not present

## 2019-07-20 DIAGNOSIS — E43 Unspecified severe protein-calorie malnutrition: Secondary | ICD-10-CM | POA: Diagnosis not present

## 2019-07-20 DIAGNOSIS — F0281 Dementia in other diseases classified elsewhere with behavioral disturbance: Secondary | ICD-10-CM | POA: Diagnosis not present

## 2019-07-20 DIAGNOSIS — I10 Essential (primary) hypertension: Secondary | ICD-10-CM | POA: Diagnosis not present

## 2019-07-20 DIAGNOSIS — M6259 Muscle wasting and atrophy, not elsewhere classified, multiple sites: Secondary | ICD-10-CM | POA: Diagnosis not present

## 2019-07-20 NOTE — Telephone Encounter (Signed)
Thank you :)

## 2019-07-21 DIAGNOSIS — M6259 Muscle wasting and atrophy, not elsewhere classified, multiple sites: Secondary | ICD-10-CM | POA: Diagnosis not present

## 2019-07-21 DIAGNOSIS — R1311 Dysphagia, oral phase: Secondary | ICD-10-CM | POA: Diagnosis not present

## 2019-07-21 DIAGNOSIS — G629 Polyneuropathy, unspecified: Secondary | ICD-10-CM | POA: Diagnosis not present

## 2019-07-21 DIAGNOSIS — I739 Peripheral vascular disease, unspecified: Secondary | ICD-10-CM | POA: Diagnosis not present

## 2019-07-21 DIAGNOSIS — G309 Alzheimer's disease, unspecified: Secondary | ICD-10-CM | POA: Diagnosis not present

## 2019-07-21 DIAGNOSIS — S52501D Unspecified fracture of the lower end of right radius, subsequent encounter for closed fracture with routine healing: Secondary | ICD-10-CM | POA: Diagnosis not present

## 2019-07-21 DIAGNOSIS — E43 Unspecified severe protein-calorie malnutrition: Secondary | ICD-10-CM | POA: Diagnosis not present

## 2019-07-21 DIAGNOSIS — F0281 Dementia in other diseases classified elsewhere with behavioral disturbance: Secondary | ICD-10-CM | POA: Diagnosis not present

## 2019-07-21 DIAGNOSIS — I10 Essential (primary) hypertension: Secondary | ICD-10-CM | POA: Diagnosis not present

## 2019-07-26 ENCOUNTER — Telehealth: Payer: Self-pay | Admitting: *Deleted

## 2019-07-26 DIAGNOSIS — I10 Essential (primary) hypertension: Secondary | ICD-10-CM | POA: Diagnosis not present

## 2019-07-26 DIAGNOSIS — F0281 Dementia in other diseases classified elsewhere with behavioral disturbance: Secondary | ICD-10-CM | POA: Diagnosis not present

## 2019-07-26 DIAGNOSIS — S52501D Unspecified fracture of the lower end of right radius, subsequent encounter for closed fracture with routine healing: Secondary | ICD-10-CM | POA: Diagnosis not present

## 2019-07-26 DIAGNOSIS — I739 Peripheral vascular disease, unspecified: Secondary | ICD-10-CM | POA: Diagnosis not present

## 2019-07-26 DIAGNOSIS — M6259 Muscle wasting and atrophy, not elsewhere classified, multiple sites: Secondary | ICD-10-CM | POA: Diagnosis not present

## 2019-07-26 DIAGNOSIS — G309 Alzheimer's disease, unspecified: Secondary | ICD-10-CM | POA: Diagnosis not present

## 2019-07-26 DIAGNOSIS — E43 Unspecified severe protein-calorie malnutrition: Secondary | ICD-10-CM | POA: Diagnosis not present

## 2019-07-26 DIAGNOSIS — R1311 Dysphagia, oral phase: Secondary | ICD-10-CM | POA: Diagnosis not present

## 2019-07-26 DIAGNOSIS — G629 Polyneuropathy, unspecified: Secondary | ICD-10-CM | POA: Diagnosis not present

## 2019-07-26 NOTE — Telephone Encounter (Signed)
[name] with [facility] Called requesting verbal orders for Speech Therapy 1x4wks.  Patient has been discharged from rehab and has an appointment to follow up on 07/29/2019. Is the orders ok. Please Advise.

## 2019-07-26 NOTE — Telephone Encounter (Signed)
Yes okay for ST

## 2019-07-27 ENCOUNTER — Ambulatory Visit: Payer: Medicare HMO | Admitting: Nurse Practitioner

## 2019-07-27 DIAGNOSIS — R1311 Dysphagia, oral phase: Secondary | ICD-10-CM | POA: Diagnosis not present

## 2019-07-27 DIAGNOSIS — F0281 Dementia in other diseases classified elsewhere with behavioral disturbance: Secondary | ICD-10-CM | POA: Diagnosis not present

## 2019-07-27 DIAGNOSIS — M6259 Muscle wasting and atrophy, not elsewhere classified, multiple sites: Secondary | ICD-10-CM | POA: Diagnosis not present

## 2019-07-27 DIAGNOSIS — E43 Unspecified severe protein-calorie malnutrition: Secondary | ICD-10-CM | POA: Diagnosis not present

## 2019-07-27 DIAGNOSIS — G309 Alzheimer's disease, unspecified: Secondary | ICD-10-CM | POA: Diagnosis not present

## 2019-07-27 DIAGNOSIS — I10 Essential (primary) hypertension: Secondary | ICD-10-CM | POA: Diagnosis not present

## 2019-07-27 DIAGNOSIS — G629 Polyneuropathy, unspecified: Secondary | ICD-10-CM | POA: Diagnosis not present

## 2019-07-27 DIAGNOSIS — I739 Peripheral vascular disease, unspecified: Secondary | ICD-10-CM | POA: Diagnosis not present

## 2019-07-27 DIAGNOSIS — S52501D Unspecified fracture of the lower end of right radius, subsequent encounter for closed fracture with routine healing: Secondary | ICD-10-CM | POA: Diagnosis not present

## 2019-07-27 NOTE — Telephone Encounter (Signed)
LMOM with verbal order request.

## 2019-07-28 DIAGNOSIS — I739 Peripheral vascular disease, unspecified: Secondary | ICD-10-CM | POA: Diagnosis not present

## 2019-07-28 DIAGNOSIS — F0281 Dementia in other diseases classified elsewhere with behavioral disturbance: Secondary | ICD-10-CM | POA: Diagnosis not present

## 2019-07-28 DIAGNOSIS — S52501D Unspecified fracture of the lower end of right radius, subsequent encounter for closed fracture with routine healing: Secondary | ICD-10-CM | POA: Diagnosis not present

## 2019-07-28 DIAGNOSIS — E43 Unspecified severe protein-calorie malnutrition: Secondary | ICD-10-CM | POA: Diagnosis not present

## 2019-07-28 DIAGNOSIS — R1311 Dysphagia, oral phase: Secondary | ICD-10-CM | POA: Diagnosis not present

## 2019-07-28 DIAGNOSIS — G629 Polyneuropathy, unspecified: Secondary | ICD-10-CM | POA: Diagnosis not present

## 2019-07-28 DIAGNOSIS — M6259 Muscle wasting and atrophy, not elsewhere classified, multiple sites: Secondary | ICD-10-CM | POA: Diagnosis not present

## 2019-07-28 DIAGNOSIS — I10 Essential (primary) hypertension: Secondary | ICD-10-CM | POA: Diagnosis not present

## 2019-07-28 DIAGNOSIS — G309 Alzheimer's disease, unspecified: Secondary | ICD-10-CM | POA: Diagnosis not present

## 2019-07-29 ENCOUNTER — Ambulatory Visit (INDEPENDENT_AMBULATORY_CARE_PROVIDER_SITE_OTHER): Payer: Medicare HMO | Admitting: Nurse Practitioner

## 2019-07-29 ENCOUNTER — Other Ambulatory Visit: Payer: Self-pay

## 2019-07-29 ENCOUNTER — Encounter: Payer: Self-pay | Admitting: Nurse Practitioner

## 2019-07-29 VITALS — BP 106/70 | HR 88 | Temp 96.4°F | Ht 64.0 in | Wt 115.0 lb

## 2019-07-29 DIAGNOSIS — G301 Alzheimer's disease with late onset: Secondary | ICD-10-CM | POA: Diagnosis not present

## 2019-07-29 DIAGNOSIS — E43 Unspecified severe protein-calorie malnutrition: Secondary | ICD-10-CM | POA: Diagnosis not present

## 2019-07-29 DIAGNOSIS — I739 Peripheral vascular disease, unspecified: Secondary | ICD-10-CM | POA: Diagnosis not present

## 2019-07-29 DIAGNOSIS — F0281 Dementia in other diseases classified elsewhere with behavioral disturbance: Secondary | ICD-10-CM | POA: Diagnosis not present

## 2019-07-29 DIAGNOSIS — K5901 Slow transit constipation: Secondary | ICD-10-CM | POA: Diagnosis not present

## 2019-07-29 DIAGNOSIS — F332 Major depressive disorder, recurrent severe without psychotic features: Secondary | ICD-10-CM

## 2019-07-29 DIAGNOSIS — M6259 Muscle wasting and atrophy, not elsewhere classified, multiple sites: Secondary | ICD-10-CM | POA: Diagnosis not present

## 2019-07-29 DIAGNOSIS — E039 Hypothyroidism, unspecified: Secondary | ICD-10-CM | POA: Diagnosis not present

## 2019-07-29 DIAGNOSIS — R2681 Unsteadiness on feet: Secondary | ICD-10-CM | POA: Diagnosis not present

## 2019-07-29 DIAGNOSIS — G309 Alzheimer's disease, unspecified: Secondary | ICD-10-CM | POA: Diagnosis not present

## 2019-07-29 DIAGNOSIS — I10 Essential (primary) hypertension: Secondary | ICD-10-CM | POA: Diagnosis not present

## 2019-07-29 DIAGNOSIS — S52501D Unspecified fracture of the lower end of right radius, subsequent encounter for closed fracture with routine healing: Secondary | ICD-10-CM | POA: Diagnosis not present

## 2019-07-29 DIAGNOSIS — R531 Weakness: Secondary | ICD-10-CM

## 2019-07-29 DIAGNOSIS — R1311 Dysphagia, oral phase: Secondary | ICD-10-CM | POA: Diagnosis not present

## 2019-07-29 DIAGNOSIS — R8290 Unspecified abnormal findings in urine: Secondary | ICD-10-CM

## 2019-07-29 DIAGNOSIS — G629 Polyneuropathy, unspecified: Secondary | ICD-10-CM | POA: Diagnosis not present

## 2019-07-29 MED ORDER — QUETIAPINE FUMARATE 25 MG PO TABS: 12.5000 mg | ORAL_TABLET | Freq: Every evening | ORAL | 1 refills | Status: DC | PRN

## 2019-07-29 NOTE — Progress Notes (Signed)
Careteam: Patient Care Team: [name], NP as PCP - General (Geriatric Medicine) Berneta Sages, NP as Nurse Practitioner (Hospice and Palliative Medicine)  PLACE OF SERVICE:  [place] Directive information    No Known Allergies  Chief Complaint  Patient presents with  . Follow-up    Follow-up from rehab stay at [facility] time 21 days. Here with daughter [name]   . Fatigue    Patient is very lethargic and family would like for patient ot be checked for UTI. Patients urines has a different odor and patient warm to the touch at times.  . Medication Management    Off Seroquel x 3 weeks, rx was not sent to pharmacy when patient was discharged from [facility]   . FYI    1 bowel movement noted within 21 day stay. Patient with 1 episode of black tarry stool (uncontrolled) on 07/15/2019. Bowels are now getting back to normal      HPI: Patient is a 86 y.o. female for rehab follow up Pt was hospitalized from April 27- may 7th due to  Rapidly worsening weakness and noted to have UTI and COVID. She was then recommended to be discharged to SNF for PT and OT  While hospitalized she completed remdesivir and decadron She completed antibiotics for UTI.   She was noted to have low TSH with T4 being high so synthroid was stopped. However while in facility got restarted to 75 mcg, daughter reports she restarted the 88 mcg when she got home (never got facility medication because it was never sent prescription to pharmacy)  While in the facility she had pneumonia. She was there 21 days per daughter and lost 14 lbs and decrease mobility. Daughter went and got her because she felt like she was not improving. Eating better now.   palliative care has been involved and done telehealth visits.   Physical therapy scheduled at home since discharge from [facility]. She has sitters during the day while her daughter works.   During rehab she only had 1 stools documented, when she got  home she had big stools daily, were very dark, black but now they are brown and normal sizes. Has no control over her bowel.   Using seroquel at the nursing home but never got the prescription fill. She is now not sleeping at time. Very agitated at night. Moving arms and legs. Hitting the walls. Very unhappy at night. Not as active where she is trying to get up but generally scoots to the floor.   Feels like urine with more of an odor and more lethargic during the day, with increase in agitation at night  Review of Systems:  Review of Systems  Unable to perform ROS: Dementia    Past Medical History:  Diagnosis Date  . Abnormality of gait   . Alzheimer's disease ([place])   . Calculus of kidney   . Closed fracture of seventh cervical vertebra without mention of spinal cord injury   . Depressive disorder, not elsewhere classified   . Edema   . Edema   . External hemorrhoids without mention of complication   . Gastric ulcer, unspecified as acute or chronic, without mention of hemorrhage, perforation, or obstruction   . GERD (gastroesophageal reflux disease)   . Hypertension   . Insomnia, unspecified   . Loss of weight   . Osteoarthrosis, unspecified whether generalized or localized, unspecified site   . Other protein-calorie malnutrition   . Pain in  thoracic spine   . Personal history of fall   . Unspecified constipation   . Unspecified hereditary and idiopathic peripheral neuropathy   . Unspecified hypothyroidism   . Unspecified vitamin D deficiency   . Urinary frequency    Past Surgical History:  Procedure Laterality Date  . TEE WITHOUT CARDIOVERSION N/A 11/13/2016   Procedure: TRANSESOPHAGEAL ECHOCARDIOGRAM (TEE);  Surgeon: Jerline Pain, MD;  Location: [facility] ENDOSCOPY;  Service: Cardiovascular;  Laterality: N/A;  . WRIST SURGERY Right 2013   Social History:   reports that she has never smoked. She has never used smokeless tobacco. She reports that she does not drink alcohol and  does not use drugs.  Family History  Problem Relation Age of Onset  . Cancer Mother   . CVA Father   . Cancer Brother   . Alcohol abuse Sister     Medications: Patient's Medications  New Prescriptions   No medications on file  Previous Medications   ACETAMINOPHEN (TYLENOL) 325 MG TABLET    Take 2 tablets (650 mg total) by mouth every 6 (six) hours as needed.   AMLODIPINE (NORVASC) 10 MG TABLET    Take 1 tablet (10 mg total) by mouth daily.   BUPROPION (WELLBUTRIN XL) 150 MG 24 HR TABLET    TAKE 1 TABLET EVERY DAY   MEMANTINE HCL-DONEPEZIL HCL (NAMZARIC) 28-10 MG CP24    Take 1 capsule by mouth daily. Please call and schedule follow up appointment for more refills   VENLAFAXINE XR (EFFEXOR-XR) 75 MG 24 HR CAPSULE    TAKE 1 CAPSULE EVERY DAY WITH BREAKFAST Must make appointment for more refills   VITAMIN D, ERGOCALCIFEROL, (DRISDOL) 1.25 MG (50000 UT) CAPS CAPSULE    Take one capsule by mouth every 14 days  Modified Medications   Modified Medication Previous Medication   QUETIAPINE (SEROQUEL) 25 MG TABLET QUEtiapine (SEROQUEL) 25 MG tablet      Take 0.5-1 tablets (12.5-25 mg total) by mouth at bedtime as needed.    Take 1 tablet (25 mg total) by mouth at bedtime.  Discontinued Medications   No medications on file    Physical Exam:  Vitals:   07/29/19 1537  BP: 106/70  Pulse: 88  Temp: (!) 96.4 F (35.8 C)  TempSrc: Temporal  Weight: 115 lb (52.2 kg)  Height: 5' 4" (1.626 m)   Body mass index is 19.74 kg/m. Wt Readings from Last 3 Encounters:  07/29/19 115 lb (52.2 kg)  06/17/19 123 lb 3.8 oz (55.9 kg)  05/26/19 125 lb (56.7 kg)    Physical Exam Constitutional:      General: She is not in acute distress.    Appearance: She is underweight. She is not diaphoretic.  HENT:     Head: Normocephalic and atraumatic.  Eyes:     Conjunctiva/sclera: Conjunctivae normal.     Pupils: Pupils are equal, round, and reactive to light.  Cardiovascular:     Rate and Rhythm:  Normal rate and regular rhythm.     Heart sounds: Normal heart sounds.  Pulmonary:     Effort: Pulmonary effort is normal.     Breath sounds: Normal breath sounds.  Abdominal:     General: Bowel sounds are normal. There is no distension.     Palpations: Abdomen is soft.     Tenderness: There is no abdominal tenderness.     Comments: Rounded abdomen, chronic for this pt  Musculoskeletal:        General: No tenderness.  Cervical back: Normal range of motion and neck supple.  Skin:    General: Skin is warm and dry.  Neurological:     Mental Status: She is easily aroused. She is lethargic and disoriented.  Psychiatric:        Behavior: Behavior is withdrawn.        Cognition and Memory: Cognition is impaired. Memory is impaired.     Labs reviewed: Basic Metabolic Panel: Recent Labs    10/05/18 1619 11/16/18 1553 06/16/19 0109 06/17/19 0725 06/18/19 0418 06/19/19 0544 06/22/19 0328  NA 141   < > 138   < > 142 140 137  K 3.5   < > 3.1*   < > 3.6 3.3* 3.6  CL 102   < > 104   < > 108 105 103  CO2 29   < > 21*   < > _0 GLUCOSE 95   < > 121*   < > 108* 99 123*  BUN 18   < > 14   < > 29* 25* 35*  CREATININE 1.03*   < > 0.66   < > 0.88 0.84 1.00  CALCIUM 9.5   < > 8.4*   < > 8.6* 8.7* 8.7*  MG  --   --  1.8  --   --   --   --   TSH 0.78  --  0.227*  --   --   --   --    < > = values in this interval not displayed.   Liver Function Tests: Recent Labs    06/17/19 0725 06/18/19 0418 06/19/19 0544  AST 25 31 44*  ALT 16 22 32  ALKPHOS 85 85 88  BILITOT 0.7 0.6 0.8  PROT 6.1* 6.5 6.7  ALBUMIN 3.4* 3.7 3.8   Recent Labs    11/16/18 1553  LIPASE 25   No results for input(s): AMMONIA in the last 8760 hours. CBC: Recent Labs    11/16/18 1553 11/16/18 1553 06/14/19 1831 06/14/19 1831 06/16/19 0109 06/17/19 0725 06/18/19 0418 06/18/19 2314 06/19/19 0544  WBC 8.0   < > 6.2   < > 3.4*   < > 7.0 7.1 8.9  NEUTROABS 5.8  --  3.7  --  2.6  --   --   --   --    HGB 13.8   < > 13.5   < > 13.8   < > 13.7 13.7 14.3  HCT 41.7   < > 41.3   < > 42.3   < > 40.9 41.9 42.2  MCV 103.7*   < > 104.0*   < > 102.9*   < > 99.8 102.9* 101.0*  PLT 215   < > 194   < > 192   < > 215 235 216   < > = values in this interval not displayed.   Lipid Panel: Recent Labs    06/15/19 0405  CHOL 191  HDL 63  LDLCALC 115*  TRIG 64  CHOLHDL 3.0   TSH: Recent Labs    10/05/18 1619 06/16/19 0109  TSH 0.78 0.227*   A1C: Lab Results  Component Value Date   HGBA1C 5.2 11/11/2016     Assessment/Plan 1. Unsteady gait -ongoing with multiple falls, has PT/OT with home health coming out to the home at this time. Also with sitters   2. Generalized weakness -ongoing, daughter questions UTI as this was the presentation with her last UTI prior to hospitalization. Educated  her that this could just be the progression of dementia with several serious illnesses over the last few months as she was hospitalized with COVID and had UTI. Will have home health nursing do a UA C&S via I&O cath at this time. - CBC with Differential/Platelet - CMP with eGFR([facility])  3. Acquired hypothyroidism -TSH low in hospital and synthroid stopped ,she was restarted to synthroid 75 mcg during rehab but daughter restarted her on 88 mcg because that is the supply they had at home. Will follow up TSH today. - TSH  4. Late onset Alzheimer's disease with behavioral disturbance ([place]) Progressive disease state, worsening physical decline after COVID and UTI with prolonged hospitalization and rehab stay. She is now back home and daughter feels like she is doing much better in this setting however with increase in agitation in the evening. Previous on seroquel in facility and daughter request this to be restated. Extensive education provided with black box warning and risk for fall with fracture given. Daughter would like to try medication in the evening because she has become so agitated and feels like she  is more at risk for falls due to this. To start Seroquel 25 mg 1/2 tablet qhs as needed, can give an additional 1/2 tablets at qhs if needed  5. Slow transit constipation Daughter has now got this controlled and without issues.   6. Abnormal urine odor -will get home health to obtain UA C&S via in and out cath due to odor with increase in weakness, lethargy and ongoing agitation in the evening.   7. Unspecified severe protein-calorie malnutrition ([place]) -lost significant weight loss at rehab, daughter reports eating better at home with positive weight gain.   8. Severe episode of recurrent major depressive disorder, without psychotic features ([place]) -stable, continues on effexor and wellbutrin   9. Peripheral vascular disease, unspecified ([place]) Stable at this time   Next appt: 3 months, sooner if needed   K. Ambrose, [facility] [phone]

## 2019-07-29 NOTE — Patient Instructions (Addendum)
To use Seroquel cautiously- risk of increase sedation and fall risk  To start with half tablet

## 2019-07-30 LAB — COMPLETE METABOLIC PANEL WITH GFR
AG Ratio: 1.6 (calc) (ref 1.0–2.5)
ALT: 12 U/L (ref 6–29)
AST: 18 U/L (ref 10–35)
Albumin: 3.7 g/dL (ref 3.6–5.1)
Alkaline phosphatase (APISO): 114 U/L (ref 37–153)
BUN/Creatinine Ratio: 19 (calc) (ref 6–22)
BUN: 19 mg/dL (ref 7–25)
CO2: 29 mmol/L (ref 20–32)
Calcium: 9.5 mg/dL (ref 8.6–10.4)
Chloride: 103 mmol/L (ref 98–110)
Creat: 1.01 mg/dL — ABNORMAL HIGH (ref 0.60–0.88)
GFR, Est African American: 58 mL/min/{1.73_m2} — ABNORMAL LOW (ref >=60)
GFR, Est Non African American: 50 mL/min/{1.73_m2} — ABNORMAL LOW (ref >=60)
Globulin: 2.3 g/dL (calc) (ref 1.9–3.7)
Glucose, Bld: 137 mg/dL (ref 65–139)
Potassium: 4.1 mmol/L (ref 3.5–5.3)
Sodium: 142 mmol/L (ref 135–146)
Total Bilirubin: 0.4 mg/dL (ref 0.2–1.2)
Total Protein: 6 g/dL — ABNORMAL LOW (ref 6.1–8.1)

## 2019-07-30 LAB — CBC WITH DIFFERENTIAL/PLATELET
Absolute Monocytes: 630 cells/uL (ref 200–950)
Basophils Absolute: 53 cells/uL (ref 0–200)
Basophils Relative: 0.7 %
Eosinophils Absolute: 98 cells/uL (ref 15–500)
Eosinophils Relative: 1.3 %
HCT: 37.7 % (ref 35.0–45.0)
Hemoglobin: 12.9 g/dL (ref 11.7–15.5)
Lymphs Abs: 1583 cells/uL (ref 850–3900)
MCH: 35.2 pg — ABNORMAL HIGH (ref 27.0–33.0)
MCHC: 34.2 g/dL (ref 32.0–36.0)
MCV: 103 fL — ABNORMAL HIGH (ref 80.0–100.0)
MPV: 9.8 fL (ref 7.5–12.5)
Monocytes Relative: 8.4 %
Neutro Abs: 5138 cells/uL (ref 1500–7800)
Neutrophils Relative %: 68.5 %
Platelets: 275 10*3/uL (ref 140–400)
RBC: 3.66 10*6/uL — ABNORMAL LOW (ref 3.80–5.10)
RDW: 12.8 % (ref 11.0–15.0)
Total Lymphocyte: 21.1 %
WBC: 7.5 10*3/uL (ref 3.8–10.8)

## 2019-07-30 LAB — TSH: TSH: 0.25 mIU/L — ABNORMAL LOW (ref 0.40–4.50)

## 2019-08-01 ENCOUNTER — Telehealth: Payer: Self-pay | Admitting: *Deleted

## 2019-08-01 ENCOUNTER — Other Ambulatory Visit: Payer: Self-pay | Admitting: Nurse Practitioner

## 2019-08-01 ENCOUNTER — Telehealth: Payer: Self-pay

## 2019-08-01 DIAGNOSIS — S52501D Unspecified fracture of the lower end of right radius, subsequent encounter for closed fracture with routine healing: Secondary | ICD-10-CM | POA: Diagnosis not present

## 2019-08-01 DIAGNOSIS — F0281 Dementia in other diseases classified elsewhere with behavioral disturbance: Secondary | ICD-10-CM | POA: Diagnosis not present

## 2019-08-01 DIAGNOSIS — G629 Polyneuropathy, unspecified: Secondary | ICD-10-CM | POA: Diagnosis not present

## 2019-08-01 DIAGNOSIS — G309 Alzheimer's disease, unspecified: Secondary | ICD-10-CM | POA: Diagnosis not present

## 2019-08-01 DIAGNOSIS — R195 Other fecal abnormalities: Secondary | ICD-10-CM

## 2019-08-01 DIAGNOSIS — M6259 Muscle wasting and atrophy, not elsewhere classified, multiple sites: Secondary | ICD-10-CM | POA: Diagnosis not present

## 2019-08-01 DIAGNOSIS — I10 Essential (primary) hypertension: Secondary | ICD-10-CM | POA: Diagnosis not present

## 2019-08-01 DIAGNOSIS — I739 Peripheral vascular disease, unspecified: Secondary | ICD-10-CM | POA: Diagnosis not present

## 2019-08-01 DIAGNOSIS — E43 Unspecified severe protein-calorie malnutrition: Secondary | ICD-10-CM | POA: Diagnosis not present

## 2019-08-01 DIAGNOSIS — E039 Hypothyroidism, unspecified: Secondary | ICD-10-CM

## 2019-08-01 DIAGNOSIS — R1311 Dysphagia, oral phase: Secondary | ICD-10-CM | POA: Diagnosis not present

## 2019-08-01 MED ORDER — LEVOTHYROXINE SODIUM 75 MCG PO TABS: 75.0000 ug | ORAL_TABLET | Freq: Every day | ORAL | 3 refills | Status: DC

## 2019-08-01 NOTE — Telephone Encounter (Signed)
[name], caregiver stated that she brought patient in on Friday and Seroquel was to be called into pharmacy. Stated that patient cannot rest and gets very agitated. Stated that is the reason for her falls.   They went to pick up the Rx on Friday but the pharmacy told them it wasn't called in, stated that the thyroid medication was called in. Daughter is requesting for the Rx to be sent to [facility] so the patient can try to get some rest.   Pended Rx and sent to [facility] for approval.

## 2019-08-01 NOTE — Telephone Encounter (Signed)
Minda with [facility] called and stated that she just wanted to let [name] know that patient had several falls yesterday with no injury. FYI

## 2019-08-01 NOTE — Telephone Encounter (Signed)
Prior authorization request received from [place] for Quetiapne 25 mg tablet take 1/2 to tablet at bedtime as needed.

## 2019-08-01 NOTE — Telephone Encounter (Signed)
Noted, we may need to stop the seroquel if she continues to have falls

## 2019-08-01 NOTE — Telephone Encounter (Signed)
Looks like Rx was sent to [facility] [address] [place]  [address] [place] [id number]

## 2019-08-02 ENCOUNTER — Telehealth: Payer: Self-pay | Admitting: *Deleted

## 2019-08-02 DIAGNOSIS — R1311 Dysphagia, oral phase: Secondary | ICD-10-CM | POA: Diagnosis not present

## 2019-08-02 DIAGNOSIS — I739 Peripheral vascular disease, unspecified: Secondary | ICD-10-CM | POA: Diagnosis not present

## 2019-08-02 DIAGNOSIS — E43 Unspecified severe protein-calorie malnutrition: Secondary | ICD-10-CM | POA: Diagnosis not present

## 2019-08-02 DIAGNOSIS — F332 Major depressive disorder, recurrent severe without psychotic features: Secondary | ICD-10-CM | POA: Insufficient documentation

## 2019-08-02 DIAGNOSIS — G309 Alzheimer's disease, unspecified: Secondary | ICD-10-CM | POA: Diagnosis not present

## 2019-08-02 DIAGNOSIS — M6259 Muscle wasting and atrophy, not elsewhere classified, multiple sites: Secondary | ICD-10-CM | POA: Diagnosis not present

## 2019-08-02 DIAGNOSIS — F0281 Dementia in other diseases classified elsewhere with behavioral disturbance: Secondary | ICD-10-CM | POA: Diagnosis not present

## 2019-08-02 DIAGNOSIS — S52501D Unspecified fracture of the lower end of right radius, subsequent encounter for closed fracture with routine healing: Secondary | ICD-10-CM | POA: Diagnosis not present

## 2019-08-02 DIAGNOSIS — I10 Essential (primary) hypertension: Secondary | ICD-10-CM | POA: Diagnosis not present

## 2019-08-02 DIAGNOSIS — G629 Polyneuropathy, unspecified: Secondary | ICD-10-CM | POA: Diagnosis not present

## 2019-08-02 NOTE — Telephone Encounter (Signed)
[name] with [facility] called and stated that patient had 6 falls over the weekend with agitation. Stated that now patient is complaining about right hip and back pain.  Nurse is calling wanting verbal orders for:  1. Mobile X-ray of Sacrum and Right Hip for pain 2. Urine Culture for the agitation.   Please Advise.

## 2019-08-02 NOTE — Telephone Encounter (Signed)
I called the pharmacy to confirm rx received and it was.  Patients daughter aware

## 2019-08-02 NOTE — Telephone Encounter (Signed)
Elmyra Ricks with [facility] notified and agreed.

## 2019-08-02 NOTE — Telephone Encounter (Signed)
Daughter called and stated that they went back to the pharmacy and she still could not get the medication. Stated that the pharmacist told her that her insurance will not cover the medication and it needed prior authorization.  Prior Authorization initiated by Evie yesterday. Awaiting insurance determination.

## 2019-08-02 NOTE — Telephone Encounter (Signed)
I had ordered a urine on Friday after they left the office and Charlene called or faxed it over.  Okay to get xray of lumbar spine and right hip due to pain.

## 2019-08-03 ENCOUNTER — Encounter: Payer: Self-pay | Admitting: Nurse Practitioner

## 2019-08-03 DIAGNOSIS — S52501D Unspecified fracture of the lower end of right radius, subsequent encounter for closed fracture with routine healing: Secondary | ICD-10-CM | POA: Diagnosis not present

## 2019-08-03 DIAGNOSIS — M25551 Pain in right hip: Secondary | ICD-10-CM | POA: Diagnosis not present

## 2019-08-03 DIAGNOSIS — F0281 Dementia in other diseases classified elsewhere with behavioral disturbance: Secondary | ICD-10-CM | POA: Diagnosis not present

## 2019-08-03 DIAGNOSIS — E43 Unspecified severe protein-calorie malnutrition: Secondary | ICD-10-CM | POA: Diagnosis not present

## 2019-08-03 DIAGNOSIS — R1311 Dysphagia, oral phase: Secondary | ICD-10-CM | POA: Diagnosis not present

## 2019-08-03 DIAGNOSIS — M25552 Pain in left hip: Secondary | ICD-10-CM | POA: Diagnosis not present

## 2019-08-03 DIAGNOSIS — I739 Peripheral vascular disease, unspecified: Secondary | ICD-10-CM | POA: Diagnosis not present

## 2019-08-03 DIAGNOSIS — Z87440 Personal history of urinary (tract) infections: Secondary | ICD-10-CM | POA: Diagnosis not present

## 2019-08-03 DIAGNOSIS — I10 Essential (primary) hypertension: Secondary | ICD-10-CM | POA: Diagnosis not present

## 2019-08-03 DIAGNOSIS — M533 Sacrococcygeal disorders, not elsewhere classified: Secondary | ICD-10-CM | POA: Diagnosis not present

## 2019-08-03 DIAGNOSIS — G629 Polyneuropathy, unspecified: Secondary | ICD-10-CM | POA: Diagnosis not present

## 2019-08-03 DIAGNOSIS — M6259 Muscle wasting and atrophy, not elsewhere classified, multiple sites: Secondary | ICD-10-CM | POA: Diagnosis not present

## 2019-08-03 DIAGNOSIS — G309 Alzheimer's disease, unspecified: Secondary | ICD-10-CM | POA: Diagnosis not present

## 2019-08-04 ENCOUNTER — Telehealth: Payer: Self-pay | Admitting: *Deleted

## 2019-08-04 DIAGNOSIS — S52501D Unspecified fracture of the lower end of right radius, subsequent encounter for closed fracture with routine healing: Secondary | ICD-10-CM | POA: Diagnosis not present

## 2019-08-04 DIAGNOSIS — R1311 Dysphagia, oral phase: Secondary | ICD-10-CM | POA: Diagnosis not present

## 2019-08-04 DIAGNOSIS — E43 Unspecified severe protein-calorie malnutrition: Secondary | ICD-10-CM | POA: Diagnosis not present

## 2019-08-04 DIAGNOSIS — M6259 Muscle wasting and atrophy, not elsewhere classified, multiple sites: Secondary | ICD-10-CM | POA: Diagnosis not present

## 2019-08-04 DIAGNOSIS — I10 Essential (primary) hypertension: Secondary | ICD-10-CM | POA: Diagnosis not present

## 2019-08-04 DIAGNOSIS — I739 Peripheral vascular disease, unspecified: Secondary | ICD-10-CM | POA: Diagnosis not present

## 2019-08-04 DIAGNOSIS — G309 Alzheimer's disease, unspecified: Secondary | ICD-10-CM | POA: Diagnosis not present

## 2019-08-04 DIAGNOSIS — F0281 Dementia in other diseases classified elsewhere with behavioral disturbance: Secondary | ICD-10-CM | POA: Diagnosis not present

## 2019-08-04 DIAGNOSIS — G629 Polyneuropathy, unspecified: Secondary | ICD-10-CM | POA: Diagnosis not present

## 2019-08-04 NOTE — Telephone Encounter (Signed)
[name] with [facility] called requesting verbal orders for a Copywriter, advertising.  Verbal order given per office protocol.

## 2019-08-05 ENCOUNTER — Telehealth: Payer: Self-pay | Admitting: Primary Care

## 2019-08-05 NOTE — Telephone Encounter (Signed)
T/c from daughter who states pt is more debilitated and falling a lot. They are paying for patient's care. They wondered if hospice pays for caregivers. I explained the hospice services and COPs. I also gave her a few ideas for pursuing in the community e.g. discussion for medicaid with dss, PACE. She will folllow up.

## 2019-08-05 NOTE — Telephone Encounter (Signed)
Received fax from [facility] Quetiapine APPROVED through 02/17/2020 ID: [id number]

## 2019-08-08 DIAGNOSIS — G629 Polyneuropathy, unspecified: Secondary | ICD-10-CM | POA: Diagnosis not present

## 2019-08-08 DIAGNOSIS — G309 Alzheimer's disease, unspecified: Secondary | ICD-10-CM | POA: Diagnosis not present

## 2019-08-08 DIAGNOSIS — F0281 Dementia in other diseases classified elsewhere with behavioral disturbance: Secondary | ICD-10-CM | POA: Diagnosis not present

## 2019-08-08 DIAGNOSIS — S52501D Unspecified fracture of the lower end of right radius, subsequent encounter for closed fracture with routine healing: Secondary | ICD-10-CM | POA: Diagnosis not present

## 2019-08-08 DIAGNOSIS — R1311 Dysphagia, oral phase: Secondary | ICD-10-CM | POA: Diagnosis not present

## 2019-08-08 DIAGNOSIS — M6259 Muscle wasting and atrophy, not elsewhere classified, multiple sites: Secondary | ICD-10-CM | POA: Diagnosis not present

## 2019-08-08 DIAGNOSIS — E43 Unspecified severe protein-calorie malnutrition: Secondary | ICD-10-CM | POA: Diagnosis not present

## 2019-08-08 DIAGNOSIS — I10 Essential (primary) hypertension: Secondary | ICD-10-CM | POA: Diagnosis not present

## 2019-08-08 DIAGNOSIS — I739 Peripheral vascular disease, unspecified: Secondary | ICD-10-CM | POA: Diagnosis not present

## 2019-08-09 DIAGNOSIS — F0281 Dementia in other diseases classified elsewhere with behavioral disturbance: Secondary | ICD-10-CM | POA: Diagnosis not present

## 2019-08-09 DIAGNOSIS — M6259 Muscle wasting and atrophy, not elsewhere classified, multiple sites: Secondary | ICD-10-CM | POA: Diagnosis not present

## 2019-08-09 DIAGNOSIS — E43 Unspecified severe protein-calorie malnutrition: Secondary | ICD-10-CM | POA: Diagnosis not present

## 2019-08-09 DIAGNOSIS — I10 Essential (primary) hypertension: Secondary | ICD-10-CM | POA: Diagnosis not present

## 2019-08-09 DIAGNOSIS — G629 Polyneuropathy, unspecified: Secondary | ICD-10-CM | POA: Diagnosis not present

## 2019-08-09 DIAGNOSIS — G309 Alzheimer's disease, unspecified: Secondary | ICD-10-CM | POA: Diagnosis not present

## 2019-08-09 DIAGNOSIS — R1311 Dysphagia, oral phase: Secondary | ICD-10-CM | POA: Diagnosis not present

## 2019-08-09 DIAGNOSIS — S52501D Unspecified fracture of the lower end of right radius, subsequent encounter for closed fracture with routine healing: Secondary | ICD-10-CM | POA: Diagnosis not present

## 2019-08-09 DIAGNOSIS — I739 Peripheral vascular disease, unspecified: Secondary | ICD-10-CM | POA: Diagnosis not present

## 2019-08-10 DIAGNOSIS — I739 Peripheral vascular disease, unspecified: Secondary | ICD-10-CM | POA: Diagnosis not present

## 2019-08-10 DIAGNOSIS — S52501D Unspecified fracture of the lower end of right radius, subsequent encounter for closed fracture with routine healing: Secondary | ICD-10-CM | POA: Diagnosis not present

## 2019-08-10 DIAGNOSIS — R1311 Dysphagia, oral phase: Secondary | ICD-10-CM | POA: Diagnosis not present

## 2019-08-10 DIAGNOSIS — I10 Essential (primary) hypertension: Secondary | ICD-10-CM | POA: Diagnosis not present

## 2019-08-10 DIAGNOSIS — G309 Alzheimer's disease, unspecified: Secondary | ICD-10-CM | POA: Diagnosis not present

## 2019-08-10 DIAGNOSIS — E43 Unspecified severe protein-calorie malnutrition: Secondary | ICD-10-CM | POA: Diagnosis not present

## 2019-08-10 DIAGNOSIS — G629 Polyneuropathy, unspecified: Secondary | ICD-10-CM | POA: Diagnosis not present

## 2019-08-10 DIAGNOSIS — M6259 Muscle wasting and atrophy, not elsewhere classified, multiple sites: Secondary | ICD-10-CM | POA: Diagnosis not present

## 2019-08-10 DIAGNOSIS — F0281 Dementia in other diseases classified elsewhere with behavioral disturbance: Secondary | ICD-10-CM | POA: Diagnosis not present

## 2019-08-12 DIAGNOSIS — E43 Unspecified severe protein-calorie malnutrition: Secondary | ICD-10-CM | POA: Diagnosis not present

## 2019-08-12 DIAGNOSIS — I10 Essential (primary) hypertension: Secondary | ICD-10-CM | POA: Diagnosis not present

## 2019-08-12 DIAGNOSIS — R1311 Dysphagia, oral phase: Secondary | ICD-10-CM | POA: Diagnosis not present

## 2019-08-12 DIAGNOSIS — G629 Polyneuropathy, unspecified: Secondary | ICD-10-CM | POA: Diagnosis not present

## 2019-08-12 DIAGNOSIS — F0281 Dementia in other diseases classified elsewhere with behavioral disturbance: Secondary | ICD-10-CM | POA: Diagnosis not present

## 2019-08-12 DIAGNOSIS — M6259 Muscle wasting and atrophy, not elsewhere classified, multiple sites: Secondary | ICD-10-CM | POA: Diagnosis not present

## 2019-08-12 DIAGNOSIS — G309 Alzheimer's disease, unspecified: Secondary | ICD-10-CM | POA: Diagnosis not present

## 2019-08-12 DIAGNOSIS — S52501D Unspecified fracture of the lower end of right radius, subsequent encounter for closed fracture with routine healing: Secondary | ICD-10-CM | POA: Diagnosis not present

## 2019-08-12 DIAGNOSIS — I739 Peripheral vascular disease, unspecified: Secondary | ICD-10-CM | POA: Diagnosis not present

## 2019-08-15 DIAGNOSIS — S52501D Unspecified fracture of the lower end of right radius, subsequent encounter for closed fracture with routine healing: Secondary | ICD-10-CM | POA: Diagnosis not present

## 2019-08-15 DIAGNOSIS — F0281 Dementia in other diseases classified elsewhere with behavioral disturbance: Secondary | ICD-10-CM | POA: Diagnosis not present

## 2019-08-15 DIAGNOSIS — I10 Essential (primary) hypertension: Secondary | ICD-10-CM | POA: Diagnosis not present

## 2019-08-15 DIAGNOSIS — I739 Peripheral vascular disease, unspecified: Secondary | ICD-10-CM | POA: Diagnosis not present

## 2019-08-15 DIAGNOSIS — E43 Unspecified severe protein-calorie malnutrition: Secondary | ICD-10-CM | POA: Diagnosis not present

## 2019-08-15 DIAGNOSIS — M6259 Muscle wasting and atrophy, not elsewhere classified, multiple sites: Secondary | ICD-10-CM | POA: Diagnosis not present

## 2019-08-15 DIAGNOSIS — R1311 Dysphagia, oral phase: Secondary | ICD-10-CM | POA: Diagnosis not present

## 2019-08-15 DIAGNOSIS — G629 Polyneuropathy, unspecified: Secondary | ICD-10-CM | POA: Diagnosis not present

## 2019-08-15 DIAGNOSIS — G309 Alzheimer's disease, unspecified: Secondary | ICD-10-CM | POA: Diagnosis not present

## 2019-08-17 DIAGNOSIS — S52501D Unspecified fracture of the lower end of right radius, subsequent encounter for closed fracture with routine healing: Secondary | ICD-10-CM | POA: Diagnosis not present

## 2019-08-17 DIAGNOSIS — G309 Alzheimer's disease, unspecified: Secondary | ICD-10-CM | POA: Diagnosis not present

## 2019-08-17 DIAGNOSIS — F0281 Dementia in other diseases classified elsewhere with behavioral disturbance: Secondary | ICD-10-CM | POA: Diagnosis not present

## 2019-08-17 DIAGNOSIS — M6259 Muscle wasting and atrophy, not elsewhere classified, multiple sites: Secondary | ICD-10-CM | POA: Diagnosis not present

## 2019-08-17 DIAGNOSIS — R1311 Dysphagia, oral phase: Secondary | ICD-10-CM | POA: Diagnosis not present

## 2019-08-17 DIAGNOSIS — E43 Unspecified severe protein-calorie malnutrition: Secondary | ICD-10-CM | POA: Diagnosis not present

## 2019-08-17 DIAGNOSIS — I10 Essential (primary) hypertension: Secondary | ICD-10-CM | POA: Diagnosis not present

## 2019-08-17 DIAGNOSIS — G629 Polyneuropathy, unspecified: Secondary | ICD-10-CM | POA: Diagnosis not present

## 2019-08-17 DIAGNOSIS — I739 Peripheral vascular disease, unspecified: Secondary | ICD-10-CM | POA: Diagnosis not present

## 2019-08-18 ENCOUNTER — Other Ambulatory Visit: Payer: Self-pay

## 2019-08-18 ENCOUNTER — Other Ambulatory Visit: Payer: Medicare HMO | Admitting: Hospice

## 2019-08-18 DIAGNOSIS — F0281 Dementia in other diseases classified elsewhere with behavioral disturbance: Secondary | ICD-10-CM

## 2019-08-18 DIAGNOSIS — G308 Other Alzheimer's disease: Secondary | ICD-10-CM | POA: Diagnosis not present

## 2019-08-18 DIAGNOSIS — F02818 Dementia in other diseases classified elsewhere, unspecified severity, with other behavioral disturbance: Secondary | ICD-10-CM

## 2019-08-18 DIAGNOSIS — Z515 Encounter for palliative care: Secondary | ICD-10-CM | POA: Diagnosis not present

## 2019-08-18 NOTE — Progress Notes (Signed)
[facility] Consult Note Telephone: [phone]  Fax: [phone]   PATIENT NAME:[name] DOB:[birth year] HYI:[id number]  PRIMARY CARE PROVIDER:[place], Carlos American, NP  [place], Carlos American, NP [place], [place] [address]  RESPONSIBLE PARTY:Primary Emergency Contact: [name],[name] Address: [address] [place] [employer], [place] [address] [place] Phone: [phone] Relation: Daughter Secondary Emergency Contact: Bottomley,Marsha Address: [address] [place], [place] [address] [place] Phone: [phone] Relation: Daughter  RECOMMENDATIONS/PLAN:  Advance Care Planning/Goals of Care: visit consisted of building trust andfollow up on palliative care.[place] DNR.  Marsha reportedsigned DNR not seen at home.  NP signed DNR form for patient today as requested; advised to place it in a conspicuous place.  Patient has MOST form in epic.  MOST form selections include DO NOT RESUSCITATE determine use of antibiotics, IV fluids for defined trial.,  Limited additional intervention, no feeding tube.  Goals of care include to maximize quality of life and symptom management.    Visit consisted of counseling and education dealing with the complex and emotionally intense issues of symptom management and palliative care in the setting of serious and potentially life-threatening illness.  Called and updated [name] on visit; discussed added services that come with hospice eligibility.  Also discussed that dementia is a progressive and terminal disease.  She expressed appreciation for the call and said she will be present next visit 10/19/2019.  Palliative care team will continue to support patient, patient's family, and medical team.  Symptom management:Physical therapy finished yesterday; ST is still going on.    Patient denies urinary symptoms, no  coughing no chest pain, no shortness of breath.  Patient able to ambulate with rolling walker; appetite is good. Patient was hospitalized April '21 for UTI, treated with Cefazolin; also diagnosed with  COVID-19 infection; rehab at [facility]. Dementia at baseline fast 6c.Privately paid caregiverscontinue tocome to the house 5 days a week; daughter [name] lives at home with patient. Barnett Applebaum - a friend-  comes 5 days to provide care.  [name] affirmed patient is compliantwith her medications. Seroquel is helpful with behavior.  Patient in no acute distress.  Ongoing care encouraged Follow CL:EXNTZGYFVC care will continue to follow patient for goals of care clarification and symptom management. I spent1 hour and 4minutes providing this consultation; time includes chart review and documentation.  More than 50% of the time in this consultation was spent on coordinating communication   HISTORY OF PRESENT ILLNESS:Gabrielle M Hawksis a 86 y.o.year oldfemalewith multiple medical problems including Alzhiemer's dementia, frequent falls, PVD, h/o fractures, constipation. Palliative Care was asked to help address goals of care. CODE STATUS:DNR  PPS:40% HOSPICE ELIGIBILITY/DIAGNOSIS: TBD  PAST MEDICAL HISTORY:  Past Medical History:  Diagnosis Date  . Abnormality of gait   . Alzheimer's disease ([place])   . Calculus of kidney   . Closed fracture of seventh cervical vertebra without mention of spinal cord injury   . Depressive disorder, not elsewhere classified   . Edema   . Edema   . External hemorrhoids without mention of complication   . Gastric ulcer, unspecified as acute or chronic, without mention of hemorrhage, perforation, or obstruction   . GERD (gastroesophageal reflux disease)   . Hypertension   . Insomnia, unspecified   . Loss of weight   . Osteoarthrosis, unspecified whether generalized or localized, unspecified site   . Other protein-calorie malnutrition   . Pain in  thoracic spine   .  Personal history of fall   . Unspecified constipation   . Unspecified hereditary and idiopathic peripheral neuropathy   . Unspecified hypothyroidism   . Unspecified vitamin D deficiency   . Urinary frequency     SOCIAL HX:  Social History   Tobacco Use  . Smoking status: Never Smoker  . Smokeless tobacco: Never Used  Substance Use Topics  . Alcohol use: No    ALLERGIES: No Known Allergies   PERTINENT MEDICATIONS:  Outpatient Encounter Medications as of 08/18/2019  Medication Sig  . acetaminophen (TYLENOL) 325 MG tablet Take 2 tablets (650 mg total) by mouth every 6 (six) hours as needed.  Marland Kitchen amLODipine (NORVASC) 10 MG tablet Take 1 tablet (10 mg total) by mouth daily.  Marland Kitchen buPROPion (WELLBUTRIN XL) 150 MG 24 hr tablet TAKE 1 TABLET EVERY DAY  . levothyroxine (SYNTHROID) 75 MCG tablet Take 1 tablet (75 mcg total) by mouth daily.  . Memantine HCl-Donepezil HCl (NAMZARIC) 28-10 MG CP24 Take 1 capsule by mouth daily. Please call and schedule follow up appointment for more refills  . QUEtiapine (SEROQUEL) 25 MG tablet Take 0.5-1 tablets (12.5-25 mg total) by mouth at bedtime as needed.  . venlafaxine XR (EFFEXOR-XR) 75 MG 24 hr capsule TAKE 1 CAPSULE EVERY DAY WITH BREAKFAST Must make appointment for more refills  . Vitamin D, Ergocalciferol, (DRISDOL) 1.25 MG (50000 UT) CAPS capsule Take one capsule by mouth every 14 days   No facility-administered encounter medications on file as of 08/18/2019.    PHYSICAL EXAM/ROS  General: NAD, cooperative Cardiovascular: regular rate and rhythm; denies chest pain Pulmonary: clear ant fields; no shortness of breath Abdomen: soft, nontender, + bowel sounds GU: no suprapubic tenderness Extremities: no edema, no joint deformities Skin: no rashes to exposed skin Neurological: Weakness but otherwise nonfocal; forgetful  Teodoro Spray, NP

## 2019-08-23 DIAGNOSIS — M6259 Muscle wasting and atrophy, not elsewhere classified, multiple sites: Secondary | ICD-10-CM | POA: Diagnosis not present

## 2019-08-23 DIAGNOSIS — G309 Alzheimer's disease, unspecified: Secondary | ICD-10-CM | POA: Diagnosis not present

## 2019-08-23 DIAGNOSIS — S52501D Unspecified fracture of the lower end of right radius, subsequent encounter for closed fracture with routine healing: Secondary | ICD-10-CM | POA: Diagnosis not present

## 2019-08-23 DIAGNOSIS — R1311 Dysphagia, oral phase: Secondary | ICD-10-CM | POA: Diagnosis not present

## 2019-08-23 DIAGNOSIS — E43 Unspecified severe protein-calorie malnutrition: Secondary | ICD-10-CM | POA: Diagnosis not present

## 2019-08-23 DIAGNOSIS — G629 Polyneuropathy, unspecified: Secondary | ICD-10-CM | POA: Diagnosis not present

## 2019-08-23 DIAGNOSIS — I739 Peripheral vascular disease, unspecified: Secondary | ICD-10-CM | POA: Diagnosis not present

## 2019-08-23 DIAGNOSIS — I10 Essential (primary) hypertension: Secondary | ICD-10-CM | POA: Diagnosis not present

## 2019-08-23 DIAGNOSIS — F0281 Dementia in other diseases classified elsewhere with behavioral disturbance: Secondary | ICD-10-CM | POA: Diagnosis not present

## 2019-08-30 DIAGNOSIS — S52501D Unspecified fracture of the lower end of right radius, subsequent encounter for closed fracture with routine healing: Secondary | ICD-10-CM | POA: Diagnosis not present

## 2019-08-30 DIAGNOSIS — I739 Peripheral vascular disease, unspecified: Secondary | ICD-10-CM | POA: Diagnosis not present

## 2019-08-30 DIAGNOSIS — F0281 Dementia in other diseases classified elsewhere with behavioral disturbance: Secondary | ICD-10-CM | POA: Diagnosis not present

## 2019-08-30 DIAGNOSIS — E43 Unspecified severe protein-calorie malnutrition: Secondary | ICD-10-CM | POA: Diagnosis not present

## 2019-08-30 DIAGNOSIS — I10 Essential (primary) hypertension: Secondary | ICD-10-CM | POA: Diagnosis not present

## 2019-08-30 DIAGNOSIS — R1311 Dysphagia, oral phase: Secondary | ICD-10-CM | POA: Diagnosis not present

## 2019-08-30 DIAGNOSIS — G309 Alzheimer's disease, unspecified: Secondary | ICD-10-CM | POA: Diagnosis not present

## 2019-08-30 DIAGNOSIS — G629 Polyneuropathy, unspecified: Secondary | ICD-10-CM | POA: Diagnosis not present

## 2019-08-30 DIAGNOSIS — M6259 Muscle wasting and atrophy, not elsewhere classified, multiple sites: Secondary | ICD-10-CM | POA: Diagnosis not present

## 2019-09-29 ENCOUNTER — Encounter: Payer: Self-pay | Admitting: Nurse Practitioner

## 2019-09-29 ENCOUNTER — Other Ambulatory Visit: Payer: Self-pay

## 2019-09-29 ENCOUNTER — Telehealth: Payer: Self-pay

## 2019-09-29 ENCOUNTER — Ambulatory Visit (INDEPENDENT_AMBULATORY_CARE_PROVIDER_SITE_OTHER): Payer: Medicare HMO | Admitting: Nurse Practitioner

## 2019-09-29 DIAGNOSIS — Z0000 Encounter for general adult medical examination without abnormal findings: Secondary | ICD-10-CM

## 2019-09-29 NOTE — Progress Notes (Signed)
Subjective:   [name] is a 86 y.o. female who presents for Medicare Annual (Subsequent) preventive examination.  Review of Systems     Cardiac Risk Factors include: advanced age (>24men, >79 women)     Objective:    There were no vitals filed for this visit. There is no height or weight on file to calculate BMI.  Advanced Directives 09/29/2019 06/14/2019 06/14/2019 10/15/2018 10/05/2018 08/25/2018 05/19/2018  Does Patient Have a Medical Advance Directive? Yes Unable to assess, patient is non-responsive or altered mental status Yes Yes No No No  Type of Advance Directive [place];Out of facility DNR (pink MOST or yellow form) - Living will;Healthcare Power of [place];Out of facility DNR (pink MOST or yellow form) Out of facility DNR (pink MOST or yellow form);Living will - - -  Does patient want to make changes to medical advance directive? No - Patient declined Yes (ED - Information included in AVS) No - Patient declined No - Guardian declined Yes (MAU/Ambulatory/Procedural Areas - Information given) - -  Copy of [place] in Chart? Yes - validated most recent copy scanned in chart (See row information) No - copy requested Yes - validated most recent copy scanned in chart (See row information) - - - -  Would patient like information on creating a medical advance directive? - - - - - No - Patient declined No - Guardian declined  Pre-existing out of facility DNR order (yellow form or pink MOST form) - - - Yellow form placed in chart (order not valid for inpatient use);Pink MOST form placed in chart (order not valid for inpatient use) - - -    Current Medications (verified) Outpatient Encounter Medications as of 09/29/2019  Medication Sig  . acetaminophen (TYLENOL) 325 MG tablet Take 2 tablets (650 mg total) by mouth every 6 (six) hours as needed.  Marland Kitchen amLODipine (NORVASC) 10 MG tablet Take 1 tablet (10 mg total) by mouth daily.  Marland Kitchen buPROPion (WELLBUTRIN  XL) 150 MG 24 hr tablet TAKE 1 TABLET EVERY DAY  . levothyroxine (SYNTHROID) 75 MCG tablet Take 1 tablet (75 mcg total) by mouth daily.  . Memantine HCl-Donepezil HCl (NAMZARIC) 28-10 MG CP24 Take 1 capsule by mouth daily. Please call and schedule follow up appointment for more refills  . QUEtiapine (SEROQUEL) 25 MG tablet Take 0.5-1 tablets (12.5-25 mg total) by mouth at bedtime as needed.  . venlafaxine XR (EFFEXOR-XR) 75 MG 24 hr capsule TAKE 1 CAPSULE EVERY DAY WITH BREAKFAST Must make appointment for more refills  . Vitamin D, Ergocalciferol, (DRISDOL) 1.25 MG (50000 UT) CAPS capsule Take one capsule by mouth every 14 days   No facility-administered encounter medications on file as of 09/29/2019.    Allergies (verified) Patient has no known allergies.   History: Past Medical History:  Diagnosis Date  . Abnormality of gait   . Alzheimer's disease ([place])   . Calculus of kidney   . Closed fracture of seventh cervical vertebra without mention of spinal cord injury   . COVID-19 06/2019  . Depressive disorder, not elsewhere classified   . Edema   . Edema   . External hemorrhoids without mention of complication   . Gastric ulcer, unspecified as acute or chronic, without mention of hemorrhage, perforation, or obstruction   . GERD (gastroesophageal reflux disease)   . Hypertension   . Insomnia, unspecified   . Loss of weight   . Osteoarthrosis, unspecified whether generalized or localized, unspecified site   .  Other protein-calorie malnutrition   . Pain in thoracic spine   . Personal history of fall   . Unspecified constipation   . Unspecified hereditary and idiopathic peripheral neuropathy   . Unspecified hypothyroidism   . Unspecified vitamin D deficiency   . Urinary frequency    Past Surgical History:  Procedure Laterality Date  . TEE WITHOUT CARDIOVERSION N/A 11/13/2016   Procedure: TRANSESOPHAGEAL ECHOCARDIOGRAM (TEE);  Surgeon: Jerline Pain, MD;  Location: [facility] ENDOSCOPY;   Service: Cardiovascular;  Laterality: N/A;  . WRIST SURGERY Right 2013   Family History  Problem Relation Age of Onset  . Cancer Mother   . CVA Father   . Cancer Brother   . Alcohol abuse Sister    Social History   Socioeconomic History  . Marital status: Widowed    Spouse name: Not on file  . Number of children: Not on file  . Years of education: Not on file  . Highest education level: Not on file  Occupational History  . Not on file  Tobacco Use  . Smoking status: Never Smoker  . Smokeless tobacco: Never Used  Vaping Use  . Vaping Use: Never used  Substance and Sexual Activity  . Alcohol use: No  . Drug use: No  . Sexual activity: Never  Other Topics Concern  . Not on file  Social History Narrative  . Not on file   Social Determinants of Health   Financial Resource Strain:   . Difficulty of Paying Living Expenses:   Food Insecurity:   . Worried About Charity fundraiser in the Last Year:   . Arboriculturist in the Last Year:   Transportation Needs:   . Film/video editor (Medical):   Marland Kitchen Lack of Transportation (Non-Medical):   Physical Activity:   . Days of Exercise per Week:   . Minutes of Exercise per Session:   Stress:   . Feeling of Stress :   Social Connections:   . Frequency of Communication with Friends and Family:   . Frequency of Social Gatherings with Friends and Family:   . Attends Religious Services:   . Active Member of Clubs or Organizations:   . Attends Archivist Meetings:   Marland Kitchen Marital Status:     Tobacco Counseling Counseling given: Not Answered   Clinical Intake:  Pre-visit preparation completed: Yes  Pain : No/denies pain     BMI - recorded: 21 Nutritional Status: BMI of 19-24  Normal Diabetes: No  How often do you need to have someone help you when you read instructions, pamphlets, or other written materials from your doctor or pharmacy?: 5 - Always  Diabetic?no         Activities of Daily Living In  your present state of health, do you have any difficulty performing the following activities: 09/29/2019 06/15/2019  Hearing? N Y  Comment - HOH  Vision? N N  Difficulty concentrating or making decisions? Tempie Donning  Walking or climbing stairs? Y Y  Comment - secondary to weakness  Dressing or bathing? Y Y  Doing errands, shopping? Tempie Donning  Preparing Food and eating ? Y -  Using the Toilet? N -  In the past six months, have you accidently leaked urine? Y -  Do you have problems with loss of bowel control? Y -  Managing your Medications? Y -  Managing your Finances? Y -  Housekeeping or managing your Housekeeping? Y -  Some recent data might be hidden  Patient Care Team: [name], NP as PCP - General (Geriatric Medicine) Berneta Sages, NP as Nurse Practitioner [facility] and Palliative Medicine)  Indicate any recent Medical Services you may have received from other than [facility] providers in the past year (date may be approximate).     Assessment:   This is a routine wellness examination for Anwar.  Hearing/Vision screen  Hearing Screening   125Hz  250Hz  500Hz  1000Hz  2000Hz  3000Hz  4000Hz  6000Hz  8000Hz   Right ear:           Left ear:           Comments: Patient doe not have any hearing issues  Vision Screening Comments: Patient does not have any vision issues  Dietary issues and exercise activities discussed: Current Exercise Habits: The patient does not participate in regular exercise at present  Goals   None    Depression Screen PHQ 2/9 Scores 09/29/2019 03/11/2018 03/06/2017 03/06/2017 12/10/2016 11/27/2015 03/07/2015  PHQ - 2 Score 0 0 0 0 0 6 2  PHQ- 9 Score - - - - - 18 3    Fall Risk Fall Risk  09/29/2019 07/29/2019 06/14/2019 05/26/2019 10/22/2018  Falls in the past year? 1 1 1 1 1   Number falls in past yr: 1 1 1 1 1   Comment - - - - -  Injury with Fall? 0 0 1 1 1   Comment Patient had severe nose bleed from recent fall and has gotten black eye from previoud falls per  daughter - - - -  Risk Factor Category  - - - - -  Risk for fall due to : - History of fall(s);Impaired balance/gait;Impaired mobility - - -    Any stairs in or around the home? Yes  If so, are there any without handrails? No  Home free of loose throw rugs in walkways, pet beds, electrical cords, etc? Yes  Adequate lighting in your home to reduce risk of falls? Yes   ASSISTIVE DEVICES UTILIZED TO PREVENT FALLS:  Life alert? No   Use of a cane, walker or w/c? Yes  Grab bars in the bathroom? Yes  Shower chair or bench in shower? Yes  Elevated toilet seat or a handicapped toilet? Yes   TIMED UP AND GO: na Cognitive Function: MMSE - Mini Mental State Exam 03/11/2018 03/06/2017 11/27/2015 03/30/2014  Orientation to time 0 0 0 2  Orientation to Place 0 2 2 2   Registration 3 3 3 3   Attention/ Calculation 0 4 0 0  Recall 0 0 0 0  Language- name 2 objects 2 2 2 2   Language- repeat 1 1 1 1   Language- follow 3 step command 2 3 3 3   Language- read & follow direction 1 1 1 1   Write a sentence 1 1 1  0  Copy design 0 1 0 0  Total score 10 18 13 14         Immunizations Immunization History  Administered Date(s) Administered  . Influenza, High Dose Seasonal PF 12/10/2016  . Influenza,inj,Quad PF,6+ Mos 11/27/2015  . PFIZER SARS-COV-2 Vaccination 06/03/2019  . PPD Test 01/22/2015  . Pneumococcal Conjugate-13 11/27/2015  . Pneumococcal Polysaccharide-23 03/06/2017  . Tdap 05/19/2018    TDAP status: Due, Education has been provided regarding the importance of this vaccine. Advised may receive this vaccine at local pharmacy or Health Dept. Aware to provide a copy of the vaccination record if obtained from local pharmacy or Health Dept. Verbalized acceptance and understanding. Flu Vaccine status: Up to date  Pneumococcal vaccine status: Up to date Covid-19 vaccine status: Information provided on how to obtain vaccines.   Qualifies for Shingles Vaccine? Yes   Zostavax completed No    Shingrix Completed?: No.    Education has been provided regarding the importance of this vaccine. Patient has been advised to call insurance company to determine out of pocket expense if they have not yet received this vaccine. Advised may also receive vaccine at local pharmacy or Health Dept. Verbalized acceptance and understanding.  Screening Tests Health Maintenance  Topic Date Due  . COVID-19 Vaccine (2 - Pfizer 2-dose series) 06/24/2019  . INFLUENZA VACCINE  09/18/2019  . TETANUS/TDAP  05/18/2028  . DEXA SCAN  Completed  . PNA vac Low Risk Adult  Completed    Health Maintenance  Health Maintenance Due  Topic Date Due  . COVID-19 Vaccine (2 - Pfizer 2-dose series) 06/24/2019  . INFLUENZA VACCINE  09/18/2019    Colorectal cancer screening: No longer required.  Mammogram status: No longer required.  Declines bone density   Lung Cancer Screening: (Low Dose CT Chest recommended if Age 53-80 years, 30 pack-year currently smoking OR have quit w/in 15years.) does not qualify.   Lung Cancer Screening Referral: na  Additional Screening:  Hepatitis C Screening: does not qualify; Completed na  Vision Screening: Recommended annual ophthalmology exams for early detection of glaucoma and other disorders of the eye. Is the patient up to date with their annual eye exam?  No  Who is the provider or what is the name of the office in which the patient attends annual eye exams? Does not have one.  If pt is not established with a provider, would they like to be referred to a provider to establish care? No .   Dental Screening: Recommended annual dental exams for proper oral hygiene  Community Resource Referral / Chronic Care Management: CRR required this visit?  No   CCM required this visit?  No      Plan:     I have personally reviewed and noted the following in the patient's chart:   . Medical and social history . Use of alcohol, tobacco or illicit drugs  . Current medications  and supplements . Functional ability and status . Nutritional status . Physical activity . Advanced directives . List of other physicians . Hospitalizations, surgeries, and ER visits in previous 12 months . Vitals . Screenings to include cognitive, depression, and falls . Referrals and appointments  In addition, I have reviewed and discussed with patient certain preventive protocols, quality metrics, and best practice recommendations. A written personalized care plan for preventive services as well as general preventive health recommendations were provided to patient.     [name], NP   09/29/2019

## 2019-09-29 NOTE — Patient Instructions (Signed)
[name] , Thank you for taking time to come for your Medicare Wellness Visit. I appreciate your ongoing commitment to your health goals. Please review the following plan we discussed and let me know if I can assist you in the future.   Screening recommendations/referrals: Colonoscopy aged out Mammogram aged out Bone Density declined at this time- let us know if you want screening for osteoporosis and treatment if needed Recommended yearly ophthalmology/optometry visit for glaucoma screening and checkup Recommended yearly dental visit for hygiene and checkup  Vaccinations: Influenza vaccine DUE- to get in office  Pneumococcal vaccine up to date Tdap vaccine DUE- to get at your local pharmacy  Shingles vaccine DUE to get shingrix at local pharmacy     To get second COVID vaccine.  Advanced directives:  On file  Conditions/risks identified: falls, fractures, progressive memory loss, at risk for failure to thrive  Next appointment: 1 year.    Preventive Care 34 Years and Older, Female Preventive care refers to lifestyle choices and visits with your health care provider that can promote health and wellness. What does preventive care include?  A yearly physical exam. This is also called an annual well check.  Dental exams once or twice a year.  Routine eye exams. Ask your health care provider how often you should have your eyes checked.  Personal lifestyle choices, including:  Daily care of your teeth and gums.  Regular physical activity.  Eating a healthy diet.  Avoiding tobacco and drug use.  Limiting alcohol use.  Practicing safe sex.  Taking low-dose aspirin every day.  Taking vitamin and mineral supplements as recommended by your health care provider. What happens during an annual well check? The services and screenings done by your health care provider during your annual well check will depend on your age, overall health, lifestyle risk factors, and family  history of disease. Counseling  Your health care provider may ask you questions about your:  Alcohol use.  Tobacco use.  Drug use.  Emotional well-being.  Home and relationship well-being.  Sexual activity.  Eating habits.  History of falls.  Memory and ability to understand (cognition).  Work and work Statistician.  Reproductive health. Screening  You may have the following tests or measurements:  Height, weight, and BMI.  Blood pressure.  Lipid and cholesterol levels. These may be checked every 5 years, or more frequently if you are over 61 years old.  Skin check.  Lung cancer screening. You may have this screening every year starting at age 24 if you have a 30-pack-year history of smoking and currently smoke or have quit within the past 15 years.  Fecal occult blood test (FOBT) of the stool. You may have this test every year starting at age 40.  Flexible sigmoidoscopy or colonoscopy. You may have a sigmoidoscopy every 5 years or a colonoscopy every 10 years starting at age 67.  Hepatitis C blood test.  Hepatitis B blood test.  Sexually transmitted disease (STD) testing.  Diabetes screening. This is done by checking your blood sugar (glucose) after you have not eaten for a while (fasting). You may have this done every 1-3 years.  Bone density scan. This is done to screen for osteoporosis. You may have this done starting at age 53.  Mammogram. This may be done every 1-2 years. Talk to your health care provider about how often you should have regular mammograms. Talk with your health care provider about your test results, treatment options, and if necessary, the  need for more tests. Vaccines  Your health care provider may recommend certain vaccines, such as:  Influenza vaccine. This is recommended every year.  Tetanus, diphtheria, and acellular pertussis (Tdap, Td) vaccine. You may need a Td booster every 10 years.  Zoster vaccine. You may need this after  age 42.  Pneumococcal 13-valent conjugate (PCV13) vaccine. One dose is recommended after age 48.  Pneumococcal polysaccharide (PPSV23) vaccine. One dose is recommended after age 5. Talk to your health care provider about which screenings and vaccines you need and how often you need them. This information is not intended to replace advice given to you by your health care provider. Make sure you discuss any questions you have with your health care provider. Document Released: 03/02/2015 Document Revised: 10/24/2015 Document Reviewed: 12/05/2014 Elsevier Interactive Patient Education  2017 [employer] Prevention in the Home Falls can cause injuries. They can happen to people of all ages. There are many things you can do to make your home safe and to help prevent falls. What can I do on the outside of my home?  Regularly fix the edges of walkways and driveways and fix any cracks.  Remove anything that might make you trip as you walk through a door, such as a raised step or threshold.  Trim any bushes or trees on the path to your home.  Use bright outdoor lighting.  Clear any walking paths of anything that might make someone trip, such as rocks or tools.  Regularly check to see if handrails are loose or broken. Make sure that both sides of any steps have handrails.  Any raised decks and porches should have guardrails on the edges.  Have any leaves, snow, or ice cleared regularly.  Use sand or salt on walking paths during winter.  Clean up any spills in your garage right away. This includes oil or grease spills. What can I do in the bathroom?  Use night lights.  Install grab bars by the toilet and in the tub and shower. Do not use towel bars as grab bars.  Use non-skid mats or decals in the tub or shower.  If you need to sit down in the shower, use a plastic, non-slip stool.  Keep the floor dry. Clean up any water that spills on the floor as soon as it  happens.  Remove soap buildup in the tub or shower regularly.  Attach bath mats securely with double-sided non-slip rug tape.  Do not have throw rugs and other things on the floor that can make you trip. What can I do in the bedroom?  Use night lights.  Make sure that you have a light by your bed that is easy to reach.  Do not use any sheets or blankets that are too big for your bed. They should not hang down onto the floor.  Have a firm chair that has side arms. You can use this for support while you get dressed.  Do not have throw rugs and other things on the floor that can make you trip. What can I do in the kitchen?  Clean up any spills right away.  Avoid walking on wet floors.  Keep items that you use a lot in easy-to-reach places.  If you need to reach something above you, use a strong step stool that has a grab bar.  Keep electrical cords out of the way.  Do not use floor polish or wax that makes floors slippery. If you must use wax,  use non-skid floor wax.  Do not have throw rugs and other things on the floor that can make you trip. What can I do with my stairs?  Do not leave any items on the stairs.  Make sure that there are handrails on both sides of the stairs and use them. Fix handrails that are broken or loose. Make sure that handrails are as long as the stairways.  Check any carpeting to make sure that it is firmly attached to the stairs. Fix any carpet that is loose or worn.  Avoid having throw rugs at the top or bottom of the stairs. If you do have throw rugs, attach them to the floor with carpet tape.  Make sure that you have a light switch at the top of the stairs and the bottom of the stairs. If you do not have them, ask someone to add them for you. What else can I do to help prevent falls?  Wear shoes that:  Do not have high heels.  Have rubber bottoms.  Are comfortable and fit you well.  Are closed at the toe. Do not wear sandals.  If you  use a stepladder:  Make sure that it is fully opened. Do not climb a closed stepladder.  Make sure that both sides of the stepladder are locked into place.  Ask someone to hold it for you, if possible.  Clearly mark and make sure that you can see:  Any grab bars or handrails.  First and last steps.  Where the edge of each step is.  Use tools that help you move around (mobility aids) if they are needed. These include:  Canes.  Walkers.  Scooters.  Crutches.  Turn on the lights when you go into a dark area. Replace any light bulbs as soon as they burn out.  Set up your furniture so you have a clear path. Avoid moving your furniture around.  If any of your floors are uneven, fix them.  If there are any pets around you, be aware of where they are.  Review your medicines with your doctor. Some medicines can make you feel dizzy. This can increase your chance of falling. Ask your doctor what other things that you can do to help prevent falls. This information is not intended to replace advice given to you by your health care provider. Make sure you discuss any questions you have with your health care provider. Document Released: 11/30/2008 Document Revised: 07/12/2015 Document Reviewed: 03/10/2014 Elsevier Interactive Patient Education  2017 [employer].

## 2019-09-29 NOTE — Progress Notes (Signed)
This service is provided via telemedicine  No vital signs collected/recorded due to the encounter was a telemedicine visit.   Location of patient (ex: home, work):  Home  Patient consents to a telephone visit:  Yes, see encounter dated 09/29/2019  Location of the provider (ex: office, home):  [place]  Name of any referring provider:  N/A  Names of all persons participating in the telemedicine service and their role in the encounter:  Sherrie Mustache, Nurse Practitioner, Carroll Kinds, CMA, and patient's daughter [name].   Time spent on call:  12 minutes with medical assistant

## 2019-09-29 NOTE — Telephone Encounter (Signed)
[name], [name] are scheduled for a virtual visit with your provider today.    Just as we do with appointments in the office, we must obtain your consent to participate.  Your consent will be active for this visit and any virtual visit you may have with one of our providers in the next 365 days.    If you have a MyChart account, I can also send a copy of this consent to you electronically.  All virtual visits are billed to your insurance company just like a traditional visit in the office.  As this is a virtual visit, video technology does not allow for your provider to perform a traditional examination.  This may limit your provider's ability to fully assess your condition.  If your provider identifies any concerns that need to be evaluated in person or the need to arrange testing such as labs, EKG, etc, we will make arrangements to do so.    Although advances in technology are sophisticated, we cannot ensure that it will always work on either your end or our end.  If the connection with a video visit is poor, we may have to switch to a telephone visit.  With either a video or telephone visit, we are not always able to ensure that we have a secure connection.   I need to obtain your verbal consent now.   Are you willing to proceed with your visit today?   Billey Chang, Daughter [name], has provided verbal consent on 09/29/2019 for a virtual visit (video or telephone).   Carroll Kinds, [facility] 09/29/2019  3:51 PM

## 2019-10-04 ENCOUNTER — Other Ambulatory Visit: Payer: Self-pay | Admitting: Nurse Practitioner

## 2019-10-04 ENCOUNTER — Other Ambulatory Visit: Payer: Self-pay | Admitting: *Deleted

## 2019-10-04 DIAGNOSIS — F332 Major depressive disorder, recurrent severe without psychotic features: Secondary | ICD-10-CM

## 2019-10-04 DIAGNOSIS — F0281 Dementia in other diseases classified elsewhere with behavioral disturbance: Secondary | ICD-10-CM

## 2019-10-04 DIAGNOSIS — G301 Alzheimer's disease with late onset: Secondary | ICD-10-CM

## 2019-10-04 MED ORDER — BUPROPION HCL ER (XL) 150 MG PO TB24: ORAL_TABLET | ORAL | 1 refills | Status: DC

## 2019-10-04 MED ORDER — VENLAFAXINE HCL ER 75 MG PO CP24: ORAL_CAPSULE | ORAL | 1 refills | Status: DC

## 2019-10-04 MED ORDER — NAMZARIC 28-10 MG PO CP24: 1.0000 | ORAL_CAPSULE | Freq: Every day | ORAL | 1 refills | Status: DC

## 2019-10-04 NOTE — Telephone Encounter (Signed)
RX last filled on 07/29/2019 by [name], NP for #30/1 refill  Please advise if patient can have more than 1 refill this time

## 2019-10-04 NOTE — Telephone Encounter (Signed)
Daughter called requesting refill to be sent to [facility].

## 2019-10-08 ENCOUNTER — Other Ambulatory Visit: Payer: Self-pay | Admitting: Nurse Practitioner

## 2019-10-19 ENCOUNTER — Other Ambulatory Visit: Payer: Self-pay

## 2019-10-19 ENCOUNTER — Other Ambulatory Visit: Payer: Medicare HMO | Admitting: Hospice

## 2019-10-19 DIAGNOSIS — F0281 Dementia in other diseases classified elsewhere with behavioral disturbance: Secondary | ICD-10-CM

## 2019-10-19 DIAGNOSIS — G308 Other Alzheimer's disease: Secondary | ICD-10-CM

## 2019-10-19 DIAGNOSIS — Z515 Encounter for palliative care: Secondary | ICD-10-CM | POA: Diagnosis not present

## 2019-10-19 NOTE — Progress Notes (Signed)
[facility] Palliative Care Consult Note Telephone: [phone]  Fax: [phone]   PATIENT NAME:[name] DOB:1931/11/09 BTD:[id number]  PRIMARY CARE PROVIDER:Eubanks, Carlos American, NP  REFERRING PROVIDER:Eubanks, Carlos American, NP [place], [place] [address]  RESPONSIBLE PARTY:Primary Emergency Contact: [name],[name] Address: [address] [place], [place] [address] [place] Phone: [phone] Relation: Daughter Secondary Emergency Contact: Bottomley,Marsha Address: [address] [place], [place] [address] [place] Phone: [phone] Relation: Daughter  TELEHEALTH VISIT STATEMENT Due to the COVID-19 crisis, this visit was done via telephone from my office. It was initiated and consented to by this patient and/or family.  RECOMMENDATIONS/PLAN:  Advance Care Planning: Televisit consisted of building trust andfollow up on palliative care. Rana Snare were present at home with patient.  CODE STATUS: Patient is a DO NOT RESUSCITATE.    Signed DNR form at home with patient: Same document is uploaded in epic.  Goals of care: Goals of care include maximizing quality of life and symptom relief. Patient has MOST form in epic.  MOST form selections include DO NOT RESUSCITATE determine use of antibiotics, IV fluids for defined trial.,  Limited additional intervention, no feeding tube.  Family is open to discussions on hospice service when patient qualifies for it.  Strong family support network identified.  Palliative care team will continue to support patient, patient's family, and medical team.  Follow FY:BOFBPZWCHE care will continue to follow patient for goals of care clarification and symptom management.  Next follow-up in 3 months.  Symptom management: Patient in no medical acuity; denies pain/discomfort.  Memory loss/confusion related todementia at  baseline FAST 6c.  Patient is a high fall risk.  [name] reports more falls in the past month, skin tears due to fall; fall  from rolling out of bed to the floor and sometimes during ambulation.  Discussion today on fall precautions; encourage surveillance and use of assistive device.   Privately paid caregiverscontinue tocome to the house 5 days a week; daughter [name] lives at home with patient.Barnett Applebaum - a friend-  comes sometimes to provide care.[name] affirmed patient is compliantwith her medications.  Seroquel is helpful with behavior.  Patient in no acute distress.  Ongoing care encouraged.  I spent 53minutes providing this consultation;time includes chart review and documentation. More than 50% of the time in this consultation was spent on coordinating communication   HISTORY OF PRESENT ILLNESS:Elanda M Hawksis a 86 y.o.year oldfemalewith multiple medical problems including Alzhiemer's dementia, frequent falls, PVD, h/o fractures, constipation. Palliative Care was asked to help address goals of care. CODE STATUS:DNR  PPS:40% HOSPICE ELIGIBILITY/DIAGNOSIS: TBD  PAST MEDICAL HISTORY:  Past Medical History:  Diagnosis Date  . Abnormality of gait   . Alzheimer's disease ([place])   . Calculus of kidney   . Closed fracture of seventh cervical vertebra without mention of spinal cord injury   . COVID-19 06/2019  . Depressive disorder, not elsewhere classified   . Edema   . Edema   . External hemorrhoids without mention of complication   . Gastric ulcer, unspecified as acute or chronic, without mention of hemorrhage, perforation, or obstruction   . GERD (gastroesophageal reflux disease)   . Hypertension   . Insomnia, unspecified   . Loss of weight   . Osteoarthrosis, unspecified whether generalized or localized, unspecified site   . Other protein-calorie malnutrition   . Pain in thoracic spine   . Personal history of fall   . Unspecified  constipation   .  Unspecified hereditary and idiopathic peripheral neuropathy   . Unspecified hypothyroidism   . Unspecified vitamin D deficiency   . Urinary frequency     SOCIAL HX:  Social History   Tobacco Use  . Smoking status: Never Smoker  . Smokeless tobacco: Never Used  Substance Use Topics  . Alcohol use: No    ALLERGIES: No Known Allergies   PERTINENT MEDICATIONS:  Outpatient Encounter Medications as of 10/19/2019  Medication Sig  . acetaminophen (TYLENOL) 325 MG tablet Take 2 tablets (650 mg total) by mouth every 6 (six) hours as needed.  Marland Kitchen amLODipine (NORVASC) 10 MG tablet Take 1 tablet (10 mg total) by mouth daily.  Marland Kitchen buPROPion (WELLBUTRIN XL) 150 MG 24 hr tablet Take one tablet by mouth once daily.  Marland Kitchen levothyroxine (SYNTHROID) 75 MCG tablet Take 1 tablet (75 mcg total) by mouth daily.  . Memantine HCl-Donepezil HCl (NAMZARIC) 28-10 MG CP24 Take 1 capsule by mouth daily.  . QUEtiapine (SEROQUEL) 25 MG tablet TAKE 1/2 TO 1 TABLET(12.5 TO 25 MG) BY MOUTH AT BEDTIME AS NEEDED  . venlafaxine XR (EFFEXOR-XR) 75 MG 24 hr capsule Take one capsule by mouth once daily with breakfast.  . Vitamin D, Ergocalciferol, (DRISDOL) 1.25 MG (50000 UNIT) CAPS capsule TAKE 1 CAPSULE BY MOUTH EVERY 14 DAYS   No facility-administered encounter medications on file as of 10/19/2019.    Teodoro Spray, NP

## 2019-10-25 ENCOUNTER — Encounter: Payer: Self-pay | Admitting: Adult Health

## 2019-10-25 ENCOUNTER — Telehealth: Payer: Self-pay

## 2019-10-25 ENCOUNTER — Other Ambulatory Visit: Payer: Self-pay

## 2019-10-25 ENCOUNTER — Ambulatory Visit (INDEPENDENT_AMBULATORY_CARE_PROVIDER_SITE_OTHER): Payer: Medicare HMO | Admitting: Adult Health

## 2019-10-25 VITALS — BP 158/92 | HR 64 | Temp 96.6°F | Ht 64.0 in | Wt 146.2 lb

## 2019-10-25 DIAGNOSIS — I10 Essential (primary) hypertension: Secondary | ICD-10-CM

## 2019-10-25 DIAGNOSIS — L03115 Cellulitis of right lower limb: Secondary | ICD-10-CM | POA: Diagnosis not present

## 2019-10-25 MED ORDER — SACCHAROMYCES BOULARDII 250 MG PO CAPS: 250.0000 mg | ORAL_CAPSULE | Freq: Two times a day (BID) | ORAL | 0 refills | Status: AC

## 2019-10-25 MED ORDER — CEPHALEXIN 500 MG PO CAPS: 500.0000 mg | ORAL_CAPSULE | Freq: Four times a day (QID) | ORAL | 0 refills | Status: AC

## 2019-10-25 NOTE — Progress Notes (Signed)
[place] [facility]  Provider:  Durenda Age, DNP  Code Status: DNR  Goals of Care:  Advanced Directives 10/25/2019  Does Patient Have a Medical Advance Directive? Yes  Type of Advance Directive Out of facility DNR (pink MOST or yellow form)  Does patient want to make changes to medical advance directive? No - Patient declined  Copy of [place] in Chart? (No Data)  Would patient like information on creating a medical advance directive? -  Pre-existing out of facility DNR order (yellow form or pink MOST form) Yellow form placed in chart (order not valid for inpatient use);Pink MOST form placed in chart (order not valid for inpatient use)     Chief Complaint  Patient presents with  . Acute Visit    Right leg wound, fell 10/16/19. Also has wound on left elbow. Presents with aide. Patient will not leave bandage on.     HPI: Patient is an 86 y.o. female seen today for an acute visit for her right shin and left elbow wound sustained from a fall on  10/16/19. Left elbow wound is dry. Right shin wound has yellow slough, moderate serous drainage, with erythema, measuring 1 in X 2 in. Left lower leg with 2+edema. She presented today with her sitter who stated that treatment is done using triple antibiotic ointment daily. Patient has been picking on the wound and removes the dressing. BP was 160/88 and re-check was 158/92. She currently takes Amlodipine 10 mg daily. Sitter stated that her BP has not been elevated. Instructed to take and log BP daily.    Past Medical History:  Diagnosis Date  . Abnormality of gait   . Alzheimer's disease ([place])   . Calculus of kidney   . Closed fracture of seventh cervical vertebra without mention of spinal cord injury   . COVID-19 06/2019  . Depressive disorder, not elsewhere classified   . Edema   . Edema   . External hemorrhoids without mention of complication   . Gastric ulcer, unspecified as acute or chronic, without mention of  hemorrhage, perforation, or obstruction   . GERD (gastroesophageal reflux disease)   . Hypertension   . Insomnia, unspecified   . Loss of weight   . Osteoarthrosis, unspecified whether generalized or localized, unspecified site   . Other protein-calorie malnutrition   . Pain in thoracic spine   . Personal history of fall   . Unspecified constipation   . Unspecified hereditary and idiopathic peripheral neuropathy   . Unspecified hypothyroidism   . Unspecified vitamin D deficiency   . Urinary frequency     Past Surgical History:  Procedure Laterality Date  . TEE WITHOUT CARDIOVERSION N/A 11/13/2016   Procedure: TRANSESOPHAGEAL ECHOCARDIOGRAM (TEE);  Surgeon: Jerline Pain, MD;  Location: [facility] ENDOSCOPY;  Service: Cardiovascular;  Laterality: N/A;  . WRIST SURGERY Right 2013    No Known Allergies  Outpatient Encounter Medications as of 10/25/2019  Medication Sig  . acetaminophen (TYLENOL) 325 MG tablet Take 2 tablets (650 mg total) by mouth every 6 (six) hours as needed.  Marland Kitchen amLODipine (NORVASC) 10 MG tablet Take 1 tablet (10 mg total) by mouth daily.  Marland Kitchen buPROPion (WELLBUTRIN XL) 150 MG 24 hr tablet Take one tablet by mouth once daily.  . cephALEXin (KEFLEX) 500 MG capsule Take 1 capsule (500 mg total) by mouth 4 (four) times daily for 10 days.  Marland Kitchen levothyroxine (SYNTHROID) 75 MCG tablet Take 1 tablet (75 mcg total) by mouth daily.  . Memantine  HCl-Donepezil HCl (NAMZARIC) 28-10 MG CP24 Take 1 capsule by mouth daily.  . QUEtiapine (SEROQUEL) 25 MG tablet TAKE 1/2 TO 1 TABLET(12.5 TO 25 MG) BY MOUTH AT BEDTIME AS NEEDED  . saccharomyces boulardii (FLORASTOR) 250 MG capsule Take 1 capsule (250 mg total) by mouth 2 (two) times daily for 17 days.  Marland Kitchen venlafaxine XR (EFFEXOR-XR) 75 MG 24 hr capsule Take one capsule by mouth once daily with breakfast.  . Vitamin D, Ergocalciferol, (DRISDOL) 1.25 MG (50000 UNIT) CAPS capsule TAKE 1 CAPSULE BY MOUTH EVERY 14 DAYS   No facility-administered  encounter medications on file as of 10/25/2019.    Review of Systems:  Review of Systems  Constitutional: Negative for activity change, appetite change and fever.  HENT: Negative for congestion.   Eyes: Negative.   Respiratory: Negative for shortness of breath and wheezing.   Genitourinary: Negative for difficulty urinating and dysuria.  Musculoskeletal: Positive for gait problem.       Uses walker at home  Skin: Positive for wound.       Sustained from a fall  Psychiatric/Behavioral: Negative for agitation.    Health Maintenance  Topic Date Due  . INFLUENZA VACCINE  09/18/2019  . TETANUS/TDAP  05/18/2028  . DEXA SCAN  Completed  . COVID-19 Vaccine  Completed  . PNA vac Low Risk Adult  Completed    Physical Exam: Vitals:   10/25/19 1506 10/25/19 1606  BP: (!) 160/88 (!) 158/92  Pulse: 64   Temp: (!) 96.6 F (35.9 C)   TempSrc: Temporal   SpO2: 94%   Weight: 146 lb 3.2 oz (66.3 kg)   Height: 5\' 4"  (1.626 m)    Body mass index is 25.1 kg/m. Physical Exam Constitutional:      Appearance: Normal appearance. She is normal weight.  HENT:     Head: Normocephalic and atraumatic.  Eyes:     Conjunctiva/sclera: Conjunctivae normal.  Cardiovascular:     Rate and Rhythm: Normal rate and regular rhythm.     Pulses: Normal pulses.     Heart sounds: Normal heart sounds. No murmur heard.   Abdominal:     General: Bowel sounds are normal.     Palpations: Abdomen is soft.  Musculoskeletal:        General: Normal range of motion.     Cervical back: Normal range of motion and neck supple.     Right lower leg: Edema present.     Comments: RLE 2+ edema  Skin:    General: Skin is warm.     Comments: Left elbow skin tear, dry with slight erythema  Neurological:     General: No focal deficit present.     Mental Status: She is alert.  Psychiatric:        Mood and Affect: Mood normal.        Behavior: Behavior normal.     Labs reviewed: Basic Metabolic Panel: Recent Labs     06/16/19 0109 06/17/19 0725 06/19/19 0544 06/22/19 0328 07/29/19 1606  NA 138   < > 140 137 142  K 3.1*   < > 3.3* 3.6 4.1  CL 104   < > 105 103 103  CO2 21*   < > 27 23 29   GLUCOSE 121*   < > 99 123* 137  BUN 14   < > 25* 35* 19  CREATININE 0.66   < > 0.84 1.00 1.01*  CALCIUM 8.4*   < > 8.7* 8.7* 9.5  MG 1.8  --   --   --   --  TSH 0.227*  --   --   --  0.25*   < > = values in this interval not displayed.   Liver Function Tests: Recent Labs    06/17/19 0725 06/17/19 0725 06/18/19 0418 06/19/19 0544 07/29/19 1606  AST 25   < > 31 44* 18  ALT 16   < > 22 32 12  ALKPHOS 85  --  85 88  --   BILITOT 0.7   < > 0.6 0.8 0.4  PROT 6.1*   < > 6.5 6.7 6.0*  ALBUMIN 3.4*  --  3.7 3.8  --    < > = values in this interval not displayed.   Recent Labs    11/16/18 1553  LIPASE 25   No results for input(s): AMMONIA in the last 8760 hours. CBC: Recent Labs    06/14/19 1831 06/14/19 1831 06/16/19 0109 06/17/19 0725 06/18/19 2314 06/19/19 0544 07/29/19 1606  WBC 6.2   < > 3.4*   < > 7.1 8.9 7.5  NEUTROABS 3.7  --  2.6  --   --   --  5,138  HGB 13.5   < > 13.8   < > 13.7 14.3 12.9  HCT 41.3   < > 42.3   < > 41.9 42.2 37.7  MCV 104.0*   < > 102.9*   < > 102.9* 101.0* 103.0*  PLT 194   < > 192   < > 235 216 275   < > = values in this interval not displayed.   Lipid Panel: Recent Labs    06/15/19 0405  CHOL 191  HDL 63  LDLCALC 115*  TRIG 64  CHOLHDL 3.0   Lab Results  Component Value Date   HGBA1C 5.2 11/11/2016     Assessment/Plan  1. Cellulitis of right lower extremity -  Instructed to cleanse right shin wound with NS. Apply xeroform, cover with nonstick dressing. Cover with Kerlix and ACE wrap every other day - cephALEXin (KEFLEX) 500 MG capsule; Take 1 capsule (500 mg total) by mouth 4 (four) times daily for 10 days.  Dispense: 40 capsule; Refill: 0 - saccharomyces boulardii (FLORASTOR) 250 MG capsule; Take 1 capsule (250 mg total) by mouth 2 (two)  times daily for 17 days.  Dispense: 34 capsule; Refill: 0   2. Essential hypertension -  BP 160/88, rechecked 158/92 - continue amlodipine 10 mg daily -  Instructed to check and log BP daily - will re-evaluate in 1 week  Labs/tests ordered:  None  Next appt:  11/02/2019  [place], MSN, FNP-BC [facility] and Adult Medicine [phone] (Monday-Friday 8:00 a.m. - 5:00 p.m.) [phone] (after hours)

## 2019-10-25 NOTE — Patient Instructions (Signed)

## 2019-10-25 NOTE — Progress Notes (Deleted)
[place] [facility]  Provider:  Durenda Age, DNP  Code Status: DNR Goals of Care:  Advanced Directives 10/25/2019  Does Patient Have a Medical Advance Directive? Yes  Type of Advance Directive Out of facility DNR (pink MOST or yellow form)  Does patient want to make changes to medical advance directive? No - Patient declined  Copy of [place] in Chart? (No Data)  Would patient like information on creating a medical advance directive? -  Pre-existing out of facility DNR order (yellow form or pink MOST form) Yellow form placed in chart (order not valid for inpatient use);Pink MOST form placed in chart (order not valid for inpatient use)     Chief Complaint  Patient presents with  . Acute Visit    Right leg wound, fell 10/16/19. Also has wound on left elbow. Presents with aide. Patient will not leave bandage on.     HPI: Patient is a 86 y.o. female seen today for an acute visit for   Past Medical History:  Diagnosis Date  . Abnormality of gait   . Alzheimer's disease ([place])   . Calculus of kidney   . Closed fracture of seventh cervical vertebra without mention of spinal cord injury   . COVID-19 06/2019  . Depressive disorder, not elsewhere classified   . Edema   . Edema   . External hemorrhoids without mention of complication   . Gastric ulcer, unspecified as acute or chronic, without mention of hemorrhage, perforation, or obstruction   . GERD (gastroesophageal reflux disease)   . Hypertension   . Insomnia, unspecified   . Loss of weight   . Osteoarthrosis, unspecified whether generalized or localized, unspecified site   . Other protein-calorie malnutrition   . Pain in thoracic spine   . Personal history of fall   . Unspecified constipation   . Unspecified hereditary and idiopathic peripheral neuropathy   . Unspecified hypothyroidism   . Unspecified vitamin D deficiency   . Urinary frequency     Past Surgical History:  Procedure Laterality Date  .  TEE WITHOUT CARDIOVERSION N/A 11/13/2016   Procedure: TRANSESOPHAGEAL ECHOCARDIOGRAM (TEE);  Surgeon: Jerline Pain, MD;  Location: [facility] ENDOSCOPY;  Service: Cardiovascular;  Laterality: N/A;  . WRIST SURGERY Right 2013    No Known Allergies  Outpatient Encounter Medications as of 10/25/2019  Medication Sig  . acetaminophen (TYLENOL) 325 MG tablet Take 2 tablets (650 mg total) by mouth every 6 (six) hours as needed.  Marland Kitchen amLODipine (NORVASC) 10 MG tablet Take 1 tablet (10 mg total) by mouth daily.  Marland Kitchen buPROPion (WELLBUTRIN XL) 150 MG 24 hr tablet Take one tablet by mouth once daily.  Marland Kitchen levothyroxine (SYNTHROID) 75 MCG tablet Take 1 tablet (75 mcg total) by mouth daily.  . Memantine HCl-Donepezil HCl (NAMZARIC) 28-10 MG CP24 Take 1 capsule by mouth daily.  . QUEtiapine (SEROQUEL) 25 MG tablet TAKE 1/2 TO 1 TABLET(12.5 TO 25 MG) BY MOUTH AT BEDTIME AS NEEDED  . venlafaxine XR (EFFEXOR-XR) 75 MG 24 hr capsule Take one capsule by mouth once daily with breakfast.  . Vitamin D, Ergocalciferol, (DRISDOL) 1.25 MG (50000 UNIT) CAPS capsule TAKE 1 CAPSULE BY MOUTH EVERY 14 DAYS   No facility-administered encounter medications on file as of 10/25/2019.    Review of Systems:  Review of Systems  Health Maintenance  Topic Date Due  . COVID-19 Vaccine (2 - Pfizer 2-dose series) 06/24/2019  . INFLUENZA VACCINE  09/18/2019  . TETANUS/TDAP  05/18/2028  .  DEXA SCAN  Completed  . PNA vac Low Risk Adult  Completed    Physical Exam: Vitals:   10/25/19 1506  BP: (!) 160/88  Pulse: 64  Temp: (!) 96.6 F (35.9 C)  TempSrc: Temporal  SpO2: 94%  Weight: 146 lb 3.2 oz (66.3 kg)  Height: 5\' 4"  (1.626 m)   Body mass index is 25.1 kg/m. Physical Exam  Labs reviewed: Basic Metabolic Panel: Recent Labs    06/16/19 0109 06/17/19 0725 06/19/19 0544 06/22/19 0328 07/29/19 1606  NA 138   < > 140 137 142  K 3.1*   < > 3.3* 3.6 4.1  CL 104   < > 105 103 103  CO2 21*   < > 27 23 29   GLUCOSE 121*   < >  99 123* 137  BUN 14   < > 25* 35* 19  CREATININE 0.66   < > 0.84 1.00 1.01*  CALCIUM 8.4*   < > 8.7* 8.7* 9.5  MG 1.8  --   --   --   --   TSH 0.227*  --   --   --  0.25*   < > = values in this interval not displayed.   Liver Function Tests: Recent Labs    06/17/19 0725 06/17/19 0725 06/18/19 0418 06/19/19 0544 07/29/19 1606  AST 25   < > 31 44* 18  ALT 16   < > 22 32 12  ALKPHOS 85  --  85 88  --   BILITOT 0.7   < > 0.6 0.8 0.4  PROT 6.1*   < > 6.5 6.7 6.0*  ALBUMIN 3.4*  --  3.7 3.8  --    < > = values in this interval not displayed.   Recent Labs    11/16/18 1553  LIPASE 25   No results for input(s): AMMONIA in the last 8760 hours. CBC: Recent Labs    06/14/19 1831 06/14/19 1831 06/16/19 0109 06/17/19 0725 06/18/19 2314 06/19/19 0544 07/29/19 1606  WBC 6.2   < > 3.4*   < > 7.1 8.9 7.5  NEUTROABS 3.7  --  2.6  --   --   --  5,138  HGB 13.5   < > 13.8   < > 13.7 14.3 12.9  HCT 41.3   < > 42.3   < > 41.9 42.2 37.7  MCV 104.0*   < > 102.9*   < > 102.9* 101.0* 103.0*  PLT 194   < > 192   < > 235 216 275   < > = values in this interval not displayed.   Lipid Panel: Recent Labs    06/15/19 0405  CHOL 191  HDL 63  LDLCALC 115*  TRIG 64  CHOLHDL 3.0   Lab Results  Component Value Date   HGBA1C 5.2 11/11/2016    Procedures since last visit: No results found.  Assessment/Plan There are no diagnoses linked to this encounter.   Labs/tests ordered:  * No order type specified * Next appt:  Visit date not found  [place], MSN, FNP-BC [facility] and Adult Medicine [phone] (Monday-Friday 8:00 a.m. - 5:00 p.m.) [phone] (after hours)

## 2019-10-25 NOTE — Progress Notes (Deleted)
[facility]  Provider:   Durenda Age - DNP  Code Status:  Full Code   Goals of Care:  Advanced Directives 10/25/2019  Does Patient Have a Medical Advance Directive? Yes  Type of Advance Directive Out of facility DNR (pink MOST or yellow form)  Does patient want to make changes to medical advance directive? No - Patient declined  Copy of [place] in Chart? (No Data)  Would patient like information on creating a medical advance directive? -  Pre-existing out of facility DNR order (yellow form or pink MOST form) Yellow form placed in chart (order not valid for inpatient use);Pink MOST form placed in chart (order not valid for inpatient use)     Chief Complaint  Patient presents with  . Acute Visit    Right leg wound, fell 10/16/19. Also has wound on left elbow. Presents with aide. Patient will not leave bandage on.     HPI: Patient is an 85 y.o. female seen today for an acute visit for right leg and left elbow wound.    Past Medical History:  Diagnosis Date  . Abnormality of gait   . Alzheimer's disease ([place])   . Calculus of kidney   . Closed fracture of seventh cervical vertebra without mention of spinal cord injury   . COVID-19 06/2019  . Depressive disorder, not elsewhere classified   . Edema   . Edema   . External hemorrhoids without mention of complication   . Gastric ulcer, unspecified as acute or chronic, without mention of hemorrhage, perforation, or obstruction   . GERD (gastroesophageal reflux disease)   . Hypertension   . Insomnia, unspecified   . Loss of weight   . Osteoarthrosis, unspecified whether generalized or localized, unspecified site   . Other protein-calorie malnutrition   . Pain in thoracic spine   . Personal history of fall   . Unspecified constipation   . Unspecified hereditary and idiopathic peripheral neuropathy   . Unspecified hypothyroidism   . Unspecified vitamin D deficiency   . Urinary frequency     Past  Surgical History:  Procedure Laterality Date  . TEE WITHOUT CARDIOVERSION N/A 11/13/2016   Procedure: TRANSESOPHAGEAL ECHOCARDIOGRAM (TEE);  Surgeon: Jerline Pain, MD;  Location: [facility] ENDOSCOPY;  Service: Cardiovascular;  Laterality: N/A;  . WRIST SURGERY Right 2013    No Known Allergies  Outpatient Encounter Medications as of 10/25/2019  Medication Sig  . acetaminophen (TYLENOL) 325 MG tablet Take 2 tablets (650 mg total) by mouth every 6 (six) hours as needed.  Marland Kitchen amLODipine (NORVASC) 10 MG tablet Take 1 tablet (10 mg total) by mouth daily.  Marland Kitchen buPROPion (WELLBUTRIN XL) 150 MG 24 hr tablet Take one tablet by mouth once daily.  Marland Kitchen levothyroxine (SYNTHROID) 75 MCG tablet Take 1 tablet (75 mcg total) by mouth daily.  . Memantine HCl-Donepezil HCl (NAMZARIC) 28-10 MG CP24 Take 1 capsule by mouth daily.  . QUEtiapine (SEROQUEL) 25 MG tablet TAKE 1/2 TO 1 TABLET(12.5 TO 25 MG) BY MOUTH AT BEDTIME AS NEEDED  . venlafaxine XR (EFFEXOR-XR) 75 MG 24 hr capsule Take one capsule by mouth once daily with breakfast.  . Vitamin D, Ergocalciferol, (DRISDOL) 1.25 MG (50000 UNIT) CAPS capsule TAKE 1 CAPSULE BY MOUTH EVERY 14 DAYS   No facility-administered encounter medications on file as of 10/25/2019.    Review of Systems:  Review of Systems  Constitutional: Positive for unexpected weight change. Negative for activity change, appetite change and fever.  Gained weight  HENT: Negative for congestion.   Eyes: Negative for discharge and itching.  Respiratory: Negative for cough and shortness of breath.   Cardiovascular: Positive for leg swelling.  Gastrointestinal: Negative for constipation.  Genitourinary: Negative for difficulty urinating and dysuria.  Musculoskeletal: Positive for gait problem. Negative for back pain.       Unsteady gait  Neurological: Negative for tremors.  Psychiatric/Behavioral: Negative for agitation, confusion and sleep disturbance.    Health Maintenance  Topic Date Due   . COVID-19 Vaccine (2 - Pfizer 2-dose series) 06/24/2019  . INFLUENZA VACCINE  09/18/2019  . TETANUS/TDAP  05/18/2028  . DEXA SCAN  Completed  . PNA vac Low Risk Adult  Completed     Physical Exam: Vitals:   10/25/19 1506  BP: (!) 160/88  Pulse: 64  Temp: (!) 96.6 F (35.9 C)  TempSrc: Temporal  SpO2: 94%  Weight: 146 lb 3.2 oz (66.3 kg)  Height: 5\' 4"  (1.626 m)   Body mass index is 25.1 kg/m. Physical Exam Constitutional:      Appearance: Normal appearance.  HENT:     Head: Normocephalic and atraumatic.     Nose: Nose normal. No congestion.  Cardiovascular:     Rate and Rhythm: Normal rate and regular rhythm.     Pulses: Normal pulses.     Heart sounds: Normal heart sounds. No murmur heard.   Pulmonary:     Effort: Pulmonary effort is normal.     Breath sounds: Normal breath sounds.  Abdominal:     General: Bowel sounds are normal.     Palpations: Abdomen is soft.  Musculoskeletal:        General: No swelling. Normal range of motion.     Cervical back: Normal range of motion.     Right lower leg: Edema present.     Comments: Right lower leg and ankle 2+edema  Skin:    General: Skin is warm and dry.     Findings: Erythema present.     Comments: Right shin wound with yellowish slough and moderate drainage  Neurological:     General: No focal deficit present.     Mental Status: She is alert.  Psychiatric:        Mood and Affect: Mood normal.        Behavior: Behavior normal.     Labs reviewed: Basic Metabolic Panel: Recent Labs    06/16/19 0109 06/17/19 0725 06/19/19 0544 06/22/19 0328 07/29/19 1606  NA 138   < > 140 137 142  K 3.1*   < > 3.3* 3.6 4.1  CL 104   < > 105 103 103  CO2 21*   < > 27 23 29   GLUCOSE 121*   < > 99 123* 137  BUN 14   < > 25* 35* 19  CREATININE 0.66   < > 0.84 1.00 1.01*  CALCIUM 8.4*   < > 8.7* 8.7* 9.5  MG 1.8  --   --   --   --   TSH 0.227*  --   --   --  0.25*   < > = values in this interval not displayed.    Liver Function Tests: Recent Labs    06/17/19 0725 06/17/19 0725 06/18/19 0418 06/19/19 0544 07/29/19 1606  AST 25   < > 31 44* 18  ALT 16   < > 22 32 12  ALKPHOS 85  --  85 88  --   BILITOT 0.7   < >  0.6 0.8 0.4  PROT 6.1*   < > 6.5 6.7 6.0*  ALBUMIN 3.4*  --  3.7 3.8  --    < > = values in this interval not displayed.   Recent Labs    11/16/18 1553  LIPASE 25   No results for input(s): AMMONIA in the last 8760 hours. CBC: Recent Labs    06/14/19 1831 06/14/19 1831 06/16/19 0109 06/17/19 0725 06/18/19 2314 06/19/19 0544 07/29/19 1606  WBC 6.2   < > 3.4*   < > 7.1 8.9 7.5  NEUTROABS 3.7  --  2.6  --   --   --  5,138  HGB 13.5   < > 13.8   < > 13.7 14.3 12.9  HCT 41.3   < > 42.3   < > 41.9 42.2 37.7  MCV 104.0*   < > 102.9*   < > 102.9* 101.0* 103.0*  PLT 194   < > 192   < > 235 216 275   < > = values in this interval not displayed.   Lipid Panel: Recent Labs    06/15/19 0405  CHOL 191  HDL 63  LDLCALC 115*  TRIG 64  CHOLHDL 3.0   Lab Results  Component Value Date   HGBA1C 5.2 11/11/2016    Assessment Plan  1. Cellulitis of right lower extremity ***  2. Essential hypertension ***       Labs/tests ordered:   None   Next appt:  In 1 week

## 2019-10-25 NOTE — Telephone Encounter (Signed)
Patients daughter called to make an appointment for her mother as she has a wound on her right lower foot close to the ankle and the mother will not leave the wound alone and it has become infected and she thinks she now needs an antibiotic made an appointment for today at 3:00 with Genia Plants NP to examine her

## 2019-11-02 ENCOUNTER — Encounter: Payer: Self-pay | Admitting: Nurse Practitioner

## 2019-11-02 ENCOUNTER — Ambulatory Visit (INDEPENDENT_AMBULATORY_CARE_PROVIDER_SITE_OTHER): Payer: Medicare HMO | Admitting: Nurse Practitioner

## 2019-11-02 ENCOUNTER — Other Ambulatory Visit: Payer: Self-pay

## 2019-11-02 VITALS — BP 148/82 | HR 88 | Temp 96.8°F | Ht 64.0 in | Wt 140.0 lb

## 2019-11-02 DIAGNOSIS — Z23 Encounter for immunization: Secondary | ICD-10-CM | POA: Diagnosis not present

## 2019-11-02 DIAGNOSIS — I10 Essential (primary) hypertension: Secondary | ICD-10-CM

## 2019-11-02 DIAGNOSIS — E039 Hypothyroidism, unspecified: Secondary | ICD-10-CM

## 2019-11-02 DIAGNOSIS — L03115 Cellulitis of right lower limb: Secondary | ICD-10-CM | POA: Diagnosis not present

## 2019-11-02 NOTE — Patient Instructions (Signed)
To use xeroform to wound, cover with nonstick dressing and then wrap with kerlex gauze Change xeroform every 3 days or sooner if dries out TO [place]- to get at medical supply store  Continue to work on elevation of leg to reduce swelling.   Follow up in 2-3 week to recheck wound If area becomes worse, redness worsens, painful, drainage occurs to notify

## 2019-11-02 NOTE — Progress Notes (Signed)
Careteam: Patient Care Team: [name], NP as PCP - General (Geriatric Medicine) Berneta Sages, NP as Nurse Practitioner (Hospice and Palliative Medicine)  PLACE OF SERVICE:  [place] Directive information    No Known Allergies  Chief Complaint  Patient presents with  . Follow-up    1 week follow-up on cellulitis. Here with sitter Tomi Bamberger      HPI: Patient is a 86 y.o. female for follow up on blood pressure and cellulitis.   Pt was treated with keflex for right lower leg cellulitis after fall. Sitter with her today. Reports redness improves in the morning but as the day progresses redness worsens.  She is using xeroform but pt continues to take dressing off and picks at wound.  She has 2+ edema but in the morning it has resolved.  She does not complain about pain.  Caregiver tries to elevate leg but she is very active.   Sitter reports she is eating better, appetite is good. Drinking more water.   Blood pressure- 141/81 (before medicine)  Review of Systems:  Review of Systems  Unable to perform ROS: Dementia    Past Medical History:  Diagnosis Date  . Abnormality of gait   . Alzheimer's disease ([place])   . Calculus of kidney   . Closed fracture of seventh cervical vertebra without mention of spinal cord injury   . COVID-19 06/2019  . Depressive disorder, not elsewhere classified   . Edema   . Edema   . External hemorrhoids without mention of complication   . Gastric ulcer, unspecified as acute or chronic, without mention of hemorrhage, perforation, or obstruction   . GERD (gastroesophageal reflux disease)   . Hypertension   . Insomnia, unspecified   . Loss of weight   . Osteoarthrosis, unspecified whether generalized or localized, unspecified site   . Other protein-calorie malnutrition   . Pain in thoracic spine   . Personal history of fall   . Unspecified constipation   . Unspecified hereditary and idiopathic peripheral neuropathy   .  Unspecified hypothyroidism   . Unspecified vitamin D deficiency   . Urinary frequency    Past Surgical History:  Procedure Laterality Date  . TEE WITHOUT CARDIOVERSION N/A 11/13/2016   Procedure: TRANSESOPHAGEAL ECHOCARDIOGRAM (TEE);  Surgeon: Jerline Pain, MD;  Location[facility] END[facility]COPY;  Service: Cardiovascular;  Laterality: N/A;  . WRIST SURGERY Right 2013   Social History:   reports that she has never smoked. She has never used smokeless tobacco. She reports that she does not drink alcohol and does not use drugs.  Family History  Problem Relation Age of Onset  . Cancer Mother   . CVA Father   . Cancer Brother   . Alcohol abuse Sister     Medications: Patient's Medications  New Prescriptions   No medications on file  Previous Medications   ACETAMINOPHEN (TYLENOL) 325 MG TABLET    Take 2 tablets (650 mg total) by mouth every 6 (six) hours as needed.   AMLODIPINE (NORVASC) 10 MG TABLET    Take 1 tablet (10 mg total) by mouth daily.   BUPROPION (WELLBUTRIN XL) 150 MG 24 HR TABLET    Take one tablet by mouth once daily.   CEPHALEXIN (KEFLEX) 500 MG CAPSULE    Take 1 capsule (500 mg total) by mouth 4 (four) times daily for 10 days.   LEVOTHYROXINE (SYNTHROID) 75 MCG TABLET    Take 1 tablet (75 mcg total) by mouth  daily.   MEMANTINE HCL-DONEPEZIL HCL (NAMZARIC) 28-10 MG CP24    Take 1 capsule by mouth daily.   PROBIOTIC PRODUCT (PROBIOTIC DAILY PO)    Take 1 tablet by mouth daily.   QUETIAPINE (SEROQUEL) 25 MG TABLET    TAKE 1/2 TO 1 TABLET(12.5 TO 25 MG) BY MOUTH AT BEDTIME AS NEEDED   SACCHAROMYCES BOULARDII (FLORASTOR) 250 MG CAPSULE    Take 1 capsule (250 mg total) by mouth 2 (two) times daily for 17 days.   VENLAFAXINE XR (EFFEXOR-XR) 75 MG 24 HR CAPSULE    Take one capsule by mouth once daily with breakfast.   VITAMIN D, ERGOCALCIFEROL, (DRISDOL) 1.25 MG (50000 UNIT) CAPS CAPSULE    TAKE 1 CAPSULE BY MOUTH EVERY 14 DAYS  Modified Medications   No medications on file   Discontinued Medications   No medications on file    Physical Exam:  Vitals:   11/02/19 1421  BP: (!) 148/82  Pulse: 88  Temp: (!) 96.8 F (36 C)  TempSrc: Temporal  SpO2: 96%  Weight: 140 lb (63.5 kg)  Height: 5\' 4"  (1.626 m)   Body mass index is 24.03 kg/m. Wt Readings from Last 3 Encounters:  11/02/19 140 lb (63.5 kg)  10/25/19 146 lb 3.2 oz (66.3 kg)  07/29/19 115 lb (52.2 kg)    Physical Exam Constitutional:      General: She is not in acute distress.    Appearance: She is well-developed. She is not diaphoretic.  HENT:     Head: Normocephalic and atraumatic.  Eyes:     Conjunctiva/sclera: Conjunctivae normal.     Pupils: Pupils are equal, round, and reactive to light.  Cardiovascular:     Rate and Rhythm: Normal rate and regular rhythm.     Heart sounds: Normal heart sounds.  Pulmonary:     Effort: Pulmonary effort is normal.     Breath sounds: Normal breath sounds.  Abdominal:     General: Bowel sounds are normal.     Palpations: Abdomen is soft.  Musculoskeletal:        General: No tenderness.     Cervical back: Normal range of motion and neck supple.     Right lower leg: 2+ Edema present.     Left lower leg: No edema.  Skin:    General: Skin is warm and dry.     Findings: Wound present.          Comments: Irregular wound noted to right outer lower leg Measurements 3.5cm (top)/ 2.5 (bottom) x 1.5 cm With scabbing noted.  No drainage or odor noted   Neurological:     Mental Status: She is alert and oriented to person, place, and time.     Labs reviewed: Basic Metabolic Panel: Recent Labs    06/16/19 0109 06/17/19 0725 06/19/19 0544 06/22/19 0328 07/29/19 1606  NA 138   < > 140 137 142  K 3.1*   < > 3.3* 3.6 4.1  CL 104   < > 105 103 103  CO2 21*   < > 27 23 29   GLUCOSE 121*   < > 99 123* 137  BUN 14   < > 25* 35* 19  CREATININE 0.66   < > 0.84 1.00 1.01*  CALCIUM 8.4*   < > 8.7* 8.7* 9.5  MG 1.8  --   --   --   --   TSH 0.227*   --   --   --  0.25*   < > =  values in this interval not displayed.   Liver Function Tests: Recent Labs    06/17/19 0725 06/17/19 0725 06/18/19 0418 06/19/19 0544 07/29/19 1606  AST 25   < > 31 44* 18  ALT 16   < > 22 32 12  ALKPHOS 85  --  85 88  --   BILITOT 0.7   < > 0.6 0.8 0.4  PROT 6.1*   < > 6.5 6.7 6.0*  ALBUMIN 3.4*  --  3.7 3.8  --    < > = values in this interval not displayed.   Recent Labs    11/16/18 1553  LIPASE 25   No results for input(s): AMMONIA in the last 8760 hours. CBC: Recent Labs    06/14/19 1831 06/14/19 1831 06/16/19 0109 06/17/19 0725 06/18/19 2314 06/19/19 0544 07/29/19 1606  WBC 6.2   < > 3.4*   < > 7.1 8.9 7.5  NEUTROABS 3.7  --  2.6  --   --   --  5,138  HGB 13.5   < > 13.8   < > 13.7 14.3 12.9  HCT 41.3   < > 42.3   < > 41.9 42.2 37.7  MCV 104.0*   < > 102.9*   < > 102.9* 101.0* 103.0*  PLT 194   < > 192   < > 235 216 275   < > = values in this interval not displayed.   Lipid Panel: Recent Labs    06/15/19 0405  CHOL 191  HDL 63  LDLCALC 115*  TRIG 64  CHOLHDL 3.0   TSH: Recent Labs    06/16/19 0109 07/29/19 1606  TSH 0.227* 0.25*   A1C: Lab Results  Component Value Date   HGBA1C 5.2 11/11/2016     Assessment/Plan 1. Cellulitis of right lower extremity Appears to be improving however with redness and increase in swelling noted at the end of the day. Encouraged to elevate LE as able. To continue with xeroform dressing and to wrap. May need compression hose to decrease edema to promote healing.  To continue xeroform until healed or directed ortherwise.  - CBC with Differential/Platelet  2. Essential hypertension -improved at this time. Will continue current regimen.  - COMPLETE METABOLIC PANEL WITH GFR  3. Acquired hypothyroidism TSH low on last check, will follow up today - TSH  4. Need for influenza vaccination - Flu Vaccine QUAD High Dose(Fluad)  Next appt: 2-3 weeks to recheck wound.  strick return  precautions given on when to follow up sooner Lari Linson K. [place], [facility] [phone]

## 2019-11-03 LAB — COMPLETE METABOLIC PANEL WITH GFR
AG Ratio: 1.5 (calc) (ref 1.0–2.5)
ALT: 23 U/L (ref 6–29)
AST: 21 U/L (ref 10–35)
Albumin: 4.1 g/dL (ref 3.6–5.1)
Alkaline phosphatase (APISO): 135 U/L (ref 37–153)
BUN/Creatinine Ratio: 24 (calc) — ABNORMAL HIGH (ref 6–22)
BUN: 28 mg/dL — ABNORMAL HIGH (ref 7–25)
CO2: 26 mmol/L (ref 20–32)
Calcium: 8.9 mg/dL (ref 8.6–10.4)
Chloride: 106 mmol/L (ref 98–110)
Creat: 1.17 mg/dL — ABNORMAL HIGH (ref 0.60–0.88)
GFR, Est African American: 49 mL/min/{1.73_m2} — ABNORMAL LOW (ref >=60)
GFR, Est Non African American: 42 mL/min/{1.73_m2} — ABNORMAL LOW (ref >=60)
Globulin: 2.7 g/dL (calc) (ref 1.9–3.7)
Glucose, Bld: 104 mg/dL (ref 65–139)
Potassium: 4.7 mmol/L (ref 3.5–5.3)
Sodium: 141 mmol/L (ref 135–146)
Total Bilirubin: 0.3 mg/dL (ref 0.2–1.2)
Total Protein: 6.8 g/dL (ref 6.1–8.1)

## 2019-11-03 LAB — CBC WITH DIFFERENTIAL/PLATELET
Absolute Monocytes: 680 cells/uL (ref 200–950)
Basophils Absolute: 49 cells/uL (ref 0–200)
Basophils Relative: 0.6 %
Eosinophils Absolute: 292 cells/uL (ref 15–500)
Eosinophils Relative: 3.6 %
HCT: 37.1 % (ref 35.0–45.0)
Hemoglobin: 12.4 g/dL (ref 11.7–15.5)
Lymphs Abs: 1750 cells/uL (ref 850–3900)
MCH: 33 pg (ref 27.0–33.0)
MCHC: 33.4 g/dL (ref 32.0–36.0)
MCV: 98.7 fL (ref 80.0–100.0)
MPV: 9.5 fL (ref 7.5–12.5)
Monocytes Relative: 8.4 %
Neutro Abs: 5330 cells/uL (ref 1500–7800)
Neutrophils Relative %: 65.8 %
Platelets: 309 10*3/uL (ref 140–400)
RBC: 3.76 10*6/uL — ABNORMAL LOW (ref 3.80–5.10)
RDW: 11.8 % (ref 11.0–15.0)
Total Lymphocyte: 21.6 %
WBC: 8.1 10*3/uL (ref 3.8–10.8)

## 2019-11-03 LAB — TSH: TSH: 3.45 mIU/L (ref 0.40–4.50)

## 2019-11-16 ENCOUNTER — Encounter: Payer: Self-pay | Admitting: Nurse Practitioner

## 2019-11-16 ENCOUNTER — Other Ambulatory Visit: Payer: Self-pay

## 2019-11-16 ENCOUNTER — Ambulatory Visit (INDEPENDENT_AMBULATORY_CARE_PROVIDER_SITE_OTHER): Payer: Medicare HMO | Admitting: Nurse Practitioner

## 2019-11-16 VITALS — BP 136/78 | HR 88 | Temp 97.9°F | Ht 64.0 in | Wt 140.0 lb

## 2019-11-16 DIAGNOSIS — S81801D Unspecified open wound, right lower leg, subsequent encounter: Secondary | ICD-10-CM | POA: Diagnosis not present

## 2019-11-16 DIAGNOSIS — K5904 Chronic idiopathic constipation: Secondary | ICD-10-CM | POA: Diagnosis not present

## 2019-11-16 DIAGNOSIS — G309 Alzheimer's disease, unspecified: Secondary | ICD-10-CM | POA: Diagnosis not present

## 2019-11-16 DIAGNOSIS — I10 Essential (primary) hypertension: Secondary | ICD-10-CM | POA: Diagnosis not present

## 2019-11-16 DIAGNOSIS — L03115 Cellulitis of right lower limb: Secondary | ICD-10-CM

## 2019-11-16 DIAGNOSIS — R296 Repeated falls: Secondary | ICD-10-CM | POA: Diagnosis not present

## 2019-11-16 DIAGNOSIS — F0281 Dementia in other diseases classified elsewhere with behavioral disturbance: Secondary | ICD-10-CM | POA: Diagnosis not present

## 2019-11-16 NOTE — Progress Notes (Signed)
Careteam: Patient Care Team: [name], NP as PCP - General (Geriatric Medicine) Berneta Sages, NP as Nurse Practitioner (Hospice and Palliative Medicine)  PLACE OF SERVICE:  [place] Directive information    No Known Allergies  Chief Complaint  Patient presents with  . Follow-up    2-3 week follow up of leg wound on right lower leg. Very little drainage. Litlle tenderness and swelling.Some redness.     HPI: Patient is a 86 y.o. female for follow up on leg wound   2 nights ago around 3 am she got up to go to the bathroom and feel and hit the dresser. Hit her face and now with bruising to forehead and around eye. No skin tears.  Denies pain to face   Leg wound slowly improving, she will not allow a dressing to stay on her leg  Constipation- daughter gives her OTC as she can, hit and miss if she will take it.   Reports legs hurt, unsteady most the time. When she was at facility she lost 14 lbs so they went to get her.  Review of Systems:  Review of Systems  Unable to perform ROS: Dementia    Past Medical History:  Diagnosis Date  . Abnormality of gait   . Alzheimer's disease ([place])   . Calculus of kidney   . Closed fracture of seventh cervical vertebra without mention of spinal cord injury   . COVID-19 06/2019  . Depressive disorder, not elsewhere classified   . Edema   . Edema   . External hemorrhoids without mention of complication   . Gastric ulcer, unspecified as acute or chronic, without mention of hemorrhage, perforation, or obstruction   . GERD (gastroesophageal reflux disease)   . Hypertension   . Insomnia, unspecified   . Loss of weight   . Osteoarthrosis, unspecified whether generalized or localized, unspecified site   . Other protein-calorie malnutrition   . Pain in thoracic spine   . Personal history of fall   . Unspecified constipation   . Unspecified hereditary and idiopathic peripheral neuropathy   . Unspecified  hypothyroidism   . Unspecified vitamin D deficiency   . Urinary frequency    Past Surgical History:  Procedure Laterality Date  . TEE WITHOUT CARDIOVERSION N/A 11/13/2016   Procedure: TRANSESOPHAGEAL ECHOCARDIOGRAM (TEE);  Surgeon: Jerline Pain, MD;  Location: [facility] ENDOSCOPY;  Service: Cardiovascular;  Laterality: N/A;  . WRIST SURGERY Right 2013   Social History:   reports that she has never smoked. She has never used smokeless tobacco. She reports that she does not drink alcohol and does not use drugs.  Family History  Problem Relation Age of Onset  . Cancer Mother   . CVA Father   . Cancer Brother   . Alcohol abuse Sister     Medications: Patient's Medications  New Prescriptions   No medications on file  Previous Medications   ACETAMINOPHEN (TYLENOL) 325 MG TABLET    Take 2 tablets (650 mg total) by mouth every 6 (six) hours as needed.   AMLODIPINE (NORVASC) 10 MG TABLET    Take 1 tablet (10 mg total) by mouth daily.   BUPROPION (WELLBUTRIN XL) 150 MG 24 HR TABLET    Take one tablet by mouth once daily.   LEVOTHYROXINE (SYNTHROID) 75 MCG TABLET    Take 1 tablet (75 mcg total) by mouth daily.   MEMANTINE HCL-DONEPEZIL HCL (NAMZARIC) 28-10 MG CP24    Take 1 capsule  by mouth daily.   PROBIOTIC PRODUCT (PROBIOTIC DAILY PO)    Take 1 tablet by mouth daily.   QUETIAPINE (SEROQUEL) 25 MG TABLET    TAKE 1/2 TO 1 TABLET(12.5 TO 25 MG) BY MOUTH AT BEDTIME AS NEEDED   VENLAFAXINE XR (EFFEXOR-XR) 75 MG 24 HR CAPSULE    Take one capsule by mouth once daily with breakfast.   VITAMIN D, ERGOCALCIFEROL, (DRISDOL) 1.25 MG (50000 UNIT) CAPS CAPSULE    TAKE 1 CAPSULE BY MOUTH EVERY 14 DAYS  Modified Medications   No medications on file  Discontinued Medications   No medications on file    Physical Exam:  Vitals:   11/16/19 1554 11/16/19 1610  BP: (!) 160/90 136/78  Pulse: 88   Temp: 97.9 F (36.6 C)   TempSrc: Temporal   SpO2: 96%   Weight: 140 lb (63.5 kg)   Height: 5\' 4"  (1.626  m)    Body mass index is 24.03 kg/m. Wt Readings from Last 3 Encounters:  11/16/19 140 lb (63.5 kg)  11/02/19 140 lb (63.5 kg)  10/25/19 146 lb 3.2 oz (66.3 kg)    Physical Exam Constitutional:      General: She is not in acute distress.    Appearance: She is well-developed. She is not diaphoretic.  HENT:     Head: Contusion and right periorbital erythema present.      Mouth/Throat:     Pharynx: No oropharyngeal exudate.  Eyes:     Conjunctiva/sclera: Conjunctivae normal.     Pupils: Pupils are equal, round, and reactive to light.  Cardiovascular:     Rate and Rhythm: Normal rate and regular rhythm.     Heart sounds: Normal heart sounds.  Pulmonary:     Effort: Pulmonary effort is normal.     Breath sounds: Normal breath sounds.  Abdominal:     General: Bowel sounds are normal. There is distension.     Palpations: Abdomen is soft.  Musculoskeletal:        General: No tenderness.     Cervical back: Normal range of motion and neck supple.     Right lower leg: Edema present.     Left lower leg: No edema.  Skin:    General: Skin is warm and dry.     Comments: 1 cm x 1.5 cm wound to left lower leg with scabbing  No redness, tenderness or drainage   Neurological:     Mental Status: She is alert. Mental status is at baseline.     Gait: Gait abnormal.     Labs reviewed: Basic Metabolic Panel: Recent Labs    06/16/19 0109 06/17/19 0725 06/22/19 0328 07/29/19 1606 11/02/19 1505  NA 138   < > 137 142 141  K 3.1*   < > 3.6 4.1 4.7  CL 104   < > 103 103 106  CO2 21*   < > 23 29 26   GLUCOSE 121*   < > 123* 137 104  BUN 14   < > 35* 19 28*  CREATININE 0.66   < > 1.00 1.01* 1.17*  CALCIUM 8.4*   < > 8.7* 9.5 8.9  MG 1.8  --   --   --   --   TSH 0.227*  --   --  0.25* 3.45   < > = values in this interval not displayed.   Liver Function Tests: Recent Labs    06/17/19 0725 06/17/19 0725 06/18/19 8295 06/18/19 0418 06/19/19 0544 07/29/19 1606 11/02/19  1505   AST 25   < > 31   < > 44* 18 21  ALT 16   < > 22   < > 32 12 23  ALKPHOS 85  --  85  --  88  --   --   BILITOT 0.7   < > 0.6   < > 0.8 0.4 0.3  PROT 6.1*   < > 6.5   < > 6.7 6.0* 6.8  ALBUMIN 3.4*  --  3.7  --  3.8  --   --    < > = values in this interval not displayed.   No results for input(s): LIPASE, AMYLASE in the last 8760 hours. No results for input(s): AMMONIA in the last 8760 hours. CBC: Recent Labs    06/16/19 0109 06/17/19 0725 06/19/19 0544 07/29/19 1606 11/02/19 1505  WBC 3.4*   < > 8.9 7.5 8.1  NEUTROABS 2.6  --   --  5,138 5,330  HGB 13.8   < > 14.3 12.9 12.4  HCT 42.3   < > 42.2 37.7 37.1  MCV 102.9*   < > 101.0* 103.0* 98.7  PLT 192   < > 216 275 309   < > = values in this interval not displayed.   Lipid Panel: Recent Labs    06/15/19 0405  CHOL 191  HDL 63  LDLCALC 115*  TRIG 64  CHOLHDL 3.0   TSH: Recent Labs    06/16/19 0109 07/29/19 1606 11/02/19 1505  TSH 0.227* 0.25* 3.45   A1C: Lab Results  Component Value Date   HGBA1C 5.2 11/11/2016     Assessment/Plan 1. Cellulitis of right lower extremity Improved at this time.   2. Essential hypertension Improved on recheck. Continues on norvasc 10 mg daily  3. Alzheimer's dementia with behavioral disturbance, unspecified timing of dementia onset ([place]) Progressive decline, has caregivers at home to help her with ADLs. Continues on Seroquel at bedtime to help with behaviors. Encouraged to use just PRN due to unsteady gait.   4. Chronic idiopathic constipation To use miralax 17 gm daily due to constipation, to use every other day if she experiences loose stools.   5. Recurrent falls Ongoing, caregivers are at the home during the day but she gets up occasionally at night. She will not use assistive devices to help prevent falls. Family providing supportive care.   6. Leg wound, right Wound to right leg continues to heal slowly, no signs of infection at this time. pt will not keep  dressing on area. Scab intact without significant redness, tenderness, warmth or drainage.   Next appt: 3 months for routine follow up [place]. [place], [facility] [phone]

## 2019-11-16 NOTE — Patient Instructions (Signed)
Would recommend taking miralax 17 gm in full glass of water daily to help with constipation    Constipation, Adult Constipation is when a person has fewer bowel movements in a week than normal, has difficulty having a bowel movement, or has stools that are dry, hard, or larger than normal. Constipation may be caused by an underlying condition. It may become worse with age if a person takes certain medicines and does not take in enough fluids. Follow these instructions at home: Eating and drinking   Eat foods that have a lot of fiber, such as fresh fruits and vegetables, whole grains, and beans.  Limit foods that are high in fat, low in fiber, or overly processed, such as french fries, hamburgers, cookies, candies, and soda.  Drink enough fluid to keep your urine clear or pale yellow. General instructions  Exercise regularly or as told by your health care provider.  Go to the restroom when you have the urge to go. Do not hold it in.  Take over-the-counter and prescription medicines only as told by your health care provider. These include any fiber supplements.  Practice pelvic floor retraining exercises, such as deep breathing while relaxing the lower abdomen and pelvic floor relaxation during bowel movements.  Watch your condition for any changes.  Keep all follow-up visits as told by your health care provider. This is important. Contact a health care provider if:  You have pain that gets worse.  You have a fever.  You do not have a bowel movement after 4 days.  You vomit.  You are not hungry.  You lose weight.  You are bleeding from the anus.  You have thin, pencil-like stools. Get help right away if:  You have a fever and your symptoms suddenly get worse.  You leak stool or have blood in your stool.  Your abdomen is bloated.  You have severe pain in your abdomen.  You feel dizzy or you faint. This information is not intended to replace advice given to you by  your health care provider. Make sure you discuss any questions you have with your health care provider. Document Revised: 01/16/2017 Document Reviewed: 07/25/2015 Elsevier Patient Education  2020 [employer].

## 2019-12-03 ENCOUNTER — Other Ambulatory Visit: Payer: Self-pay | Admitting: Nurse Practitioner

## 2020-01-09 ENCOUNTER — Other Ambulatory Visit: Payer: Self-pay | Admitting: Nurse Practitioner

## 2020-01-09 ENCOUNTER — Telehealth: Payer: Self-pay | Admitting: *Deleted

## 2020-01-09 DIAGNOSIS — I10 Essential (primary) hypertension: Secondary | ICD-10-CM

## 2020-01-09 MED ORDER — AMLODIPINE BESYLATE 5 MG PO TABS
5.0000 mg | ORAL_TABLET | Freq: Every day | ORAL | 1 refills | Status: DC
Start: 2020-01-09 — End: 2020-07-03

## 2020-01-09 NOTE — Telephone Encounter (Signed)
Refill updated and sent to pharmacy.

## 2020-01-09 NOTE — Telephone Encounter (Signed)
Yes okay to change and refill. Thank you

## 2020-01-09 NOTE — Telephone Encounter (Signed)
Patient daughter called requesting refill on Amlodipine 5mg  once daily.   Current medication list states Amlodipine 10mg  but daughter states patient has been taking 5mg  and that [name] had changed it.   Is this ok to update and send Rx.  Please Advise.

## 2020-01-23 ENCOUNTER — Other Ambulatory Visit: Payer: Self-pay

## 2020-01-23 ENCOUNTER — Other Ambulatory Visit: Payer: Medicare HMO | Admitting: Hospice

## 2020-01-23 DIAGNOSIS — Z515 Encounter for palliative care: Secondary | ICD-10-CM

## 2020-01-23 DIAGNOSIS — F0391 Unspecified dementia with behavioral disturbance: Secondary | ICD-10-CM

## 2020-01-23 NOTE — Progress Notes (Signed)
[facility] Pall[facility] Consult Note Telephone: [phone]  Fax: [phone]  PATIENT NAME: [name] DOB: 02/22/1931 MRN: [id number]  PRIMARY CARE PROVIDER:   [name], NP [name], NP [place],  [place] [address]  REFERRING PROVIDER: [name], NP [name], NP [place],  [place] [address]  RESPONSIBLE PARTY:Primary Emergency Contact: [name],[name] Address: [address] [place], [place] [address] [place] Phone: [phone] Relation: Daughter Secondary Emergency Contact: Bottomley,Marsha Address: [address] [place], [place] [address] [place] Phone: [phone] Relation: Daughter  TELEHEALTH VISIT STATEMENT Due to the COVID-19 crisis, this visit was done via telephone from my office. It was initiated and consented to by this patient and/or family.  RECOMMENDATIONS/PLAN:  Advance Care Planning: Televisit consisted of building trust andfollow up on palliative care.  CODE STATUS:  CODE STATUS reviewed.  Patient is a DO NOT RESUSCITATE.  Goals of care: Goals of care include maximizing quality of life and symptom relief.Patient has MOST form in epic. MOST form selections include DO NOT RESUSCITATE determine use of antibiotics, IV fluids for defined trial., Limited additional intervention, no feeding tube.  Family is open to discussions on hospice service when patient qualifies for it. Palliative care team will continue to support patient, patient's family, and medical team.  Follow HW:[id number] care will continue to follow patient for goals of care clarification and symptom management.  Next follow-up in 4 months.  Symptom management: Patient in no medical acuity; fairly stable in her chronic health conditions.  No ED visit or hospitalization since last visit. Memory loss/confusion related  todementia at baseline FAST 6c.  Behavior-agitation is well managed with Seroquel.  Patient is compliant with her medications.  [name] with no concerns at this time.  Encouraged use of assistive device and fall precautions  Family/caregiver/community supports: Patient lives at home with her daughter [name]. Privately paid caregiverscontinue tocome to the house 5 days a week. Strong family support system identified.  I spent19minutes providing this consultation;time includes chart review and documentation. More than 50% of the time in this consultation was spent on coordinating communication   HISTORY OF PRESENT ILLNESS:Gabrielle M Hawksis a 86 y.o.year oldfemalewith multiple medical problems including Alzhiemer's dementia, frequent falls, PVD, h/o fractures, constipation. Palliative Care was asked to help address goals of care.  CODE STATUS:DNR  PPS:40%  HOSPICE ELIGIBILITY/DIAGNOSIS: TBD  PAST MEDICAL HISTORY:  Past Medical History:  Diagnosis Date  . Abnormality of gait   . Alzheimer's disease ([place])   . Calculus of kidney   . Closed fracture of seventh cervical vertebra without mention of spinal cord injury   . COVID-19 06/2019  . Depressive disorder, not elsewhere classified   . Edema   . Edema   . External hemorrhoids without mention of complication   . Gastric ulcer, unspecified as acute or chronic, without mention of hemorrhage, perforation, or obstruction   . GERD (gastroesophageal reflux disease)   . Hypertension   . Insomnia, unspecified   . Loss of weight   . Osteoarthrosis, unspecified whether generalized or localized, unspecified site   . Other protein-calorie malnutrition   . Pain in thoracic spine   . Personal history of fall   . Unspecified constipation   . Unspecified hereditary and idiopathic peripheral neuropathy   . Unspecified hypothyroidism   . Unspecified vitamin D deficiency   . Urinary frequency     SOCIAL HX:  Social  History    Tobacco Use  . Smoking status: Never Smoker  . Smokeless tobacco: Never Used  Substance Use Topics  . Alcohol use: No    ALLERGIES: No Known Allergies   PERTINENT MEDICATIONS:  Outpatient Encounter Medications as of 01/23/2020  Medication Sig  . acetaminophen (TYLENOL) 325 MG tablet Take 2 tablets (650 mg total) by mouth every 6 (six) hours as needed.  Marland Kitchen amLODipine (NORVASC) 5 MG tablet Take 1 tablet (5 mg total) by mouth daily.  Marland Kitchen buPROPion (WELLBUTRIN XL) 150 MG 24 hr tablet Take one tablet by mouth once daily.  Marland Kitchen levothyroxine (SYNTHROID) 75 MCG tablet Take 1 tablet (75 mcg total) by mouth daily.  . Memantine HCl-Donepezil HCl (NAMZARIC) 28-10 MG CP24 Take 1 capsule by mouth daily.  . Probiotic Product (PROBIOTIC DAILY PO) Take 1 tablet by mouth daily.  . QUEtiapine (SEROQUEL) 25 MG tablet TAKE 1/2 TO 1 TABLET(12.5 TO 25 MG) BY MOUTH AT BEDTIME AS NEEDED  . venlafaxine XR (EFFEXOR-XR) 75 MG 24 hr capsule Take one capsule by mouth once daily with breakfast.  . Vitamin D, Ergocalciferol, (DRISDOL) 1.25 MG (50000 UNIT) CAPS capsule TAKE 1 CAPSULE BY MOUTH EVERY 14 DAYS   No facility-administered encounter medications on file as of 01/23/2020.    Note:  Portions of this note were generated with [employer]. Dictation errors may occur despite attempts at proofreading.  Teodoro Spray, NP

## 2020-02-01 ENCOUNTER — Emergency Department (HOSPITAL_COMMUNITY)
Admission: EM | Admit: 2020-02-01 | Discharge: 2020-02-02 | Disposition: A | Discharge status: Home / Self Care | Payer: Medicare HMO | Attending: Emergency Medicine | Admitting: Emergency Medicine

## 2020-02-01 DIAGNOSIS — R531 Weakness: Secondary | ICD-10-CM | POA: Diagnosis not present

## 2020-02-01 DIAGNOSIS — E039 Hypothyroidism, unspecified: Secondary | ICD-10-CM | POA: Insufficient documentation

## 2020-02-01 DIAGNOSIS — Z8616 Personal history of COVID-19: Secondary | ICD-10-CM | POA: Insufficient documentation

## 2020-02-01 DIAGNOSIS — R55 Syncope and collapse: Secondary | ICD-10-CM | POA: Diagnosis not present

## 2020-02-01 DIAGNOSIS — Z79899 Other long term (current) drug therapy: Secondary | ICD-10-CM | POA: Diagnosis not present

## 2020-02-01 DIAGNOSIS — G309 Alzheimer's disease, unspecified: Secondary | ICD-10-CM | POA: Diagnosis not present

## 2020-02-01 DIAGNOSIS — R29810 Facial weakness: Secondary | ICD-10-CM | POA: Diagnosis not present

## 2020-02-01 DIAGNOSIS — R404 Transient alteration of awareness: Secondary | ICD-10-CM | POA: Diagnosis not present

## 2020-02-01 DIAGNOSIS — E860 Dehydration: Secondary | ICD-10-CM

## 2020-02-01 DIAGNOSIS — I10 Essential (primary) hypertension: Secondary | ICD-10-CM | POA: Diagnosis not present

## 2020-02-01 DIAGNOSIS — I517 Cardiomegaly: Secondary | ICD-10-CM | POA: Diagnosis not present

## 2020-02-01 LAB — I-STAT CHEM 8, ED
BUN: 28 mg/dL — ABNORMAL HIGH (ref 8–23)
Calcium, Ion: 1.09 mmol/L — ABNORMAL LOW (ref 1.15–1.40)
Chloride: 106 mmol/L (ref 98–111)
Creatinine, Ser: 1.2 mg/dL — ABNORMAL HIGH (ref 0.44–1.00)
Glucose, Bld: 138 mg/dL — ABNORMAL HIGH (ref 70–99)
HCT: 37 % (ref 36.0–46.0)
Hemoglobin: 12.6 g/dL (ref 12.0–15.0)
Potassium: 3.1 mmol/L — ABNORMAL LOW (ref 3.5–5.1)
Sodium: 142 mmol/L (ref 135–145)
TCO2: 24 mmol/L (ref 22–32)

## 2020-02-01 LAB — CBG MONITORING, ED: Glucose-Capillary: 120 mg/dL — ABNORMAL HIGH (ref 70–99)

## 2020-02-01 MED ORDER — SODIUM CHLORIDE 0.9 % IV SOLN
1000.0000 mL | INTRAVENOUS | Status: DC
  Administered 2020-02-02: 1000 mL via INTRAVENOUS

## 2020-02-01 MED ORDER — SODIUM CHLORIDE 0.9 % IV BOLUS (SEPSIS)
1000.0000 mL | Freq: Once | INTRAVENOUS | Status: AC
  Administered 2020-02-02: 1000 mL via INTRAVENOUS

## 2020-02-01 NOTE — ED Provider Notes (Signed)
[facility] EMERGENCY DEPARTMENT Provider Note  CSN: [id number] Arrival date & time: 02/01/20 2305  Chief Complaint(s) Loss of Consciousness ED Triage Notes Unk Lightning, RN (Registered Nurse) . [facility] . [facility] Date of Service: 02/01/2020 11:06 PM . . Signed   Pt arrived via [facility] from home after pt was having bowel movement at home and had a syncopal episode. Pt was minimally responsive on arrival by ems. Pt was more responsive on arrival to ER. Pt received 542ml of fluid by EMS.       HPI [name] is a 86 y.o. female with a past medical history listed below who presents to the emergency department after syncopal episode.  Patient is brought in by EMS who reported patient was found on the toilet unresponsive.  Patient has a history of Alzheimer's dementia and is disoriented at baseline.   Remainder of history, ROS, and physical exam limited due to patient's condition (dementia). Additional information was obtained from EMS.   Level V Caveat.  Patient's caregiver arrived later and reported that the patient had been in her normal state of health prior to this moment.  She reported that the patient went to use the bathroom to have a bowel movement.  She was being cared for by another sitter who reported that the patient was talking to her through the door while having a bowel movement.  After a few minutes of no interaction the caregiver entered the bathroom and found the patient unresponsive on the toilet.  Patient was apparently still having a bowel movement at that time.  They denied any blood in the bowel movement.  No melena.  Stool was brownish-green per the recollection.  They deny any trauma.  HPI  Past Medical History Past Medical History:  Diagnosis Date  . Abnormality of gait   . Alzheimer's disease ([place])   . Calculus of kidney   . Closed fracture of seventh cervical vertebra without mention of spinal cord injury   . COVID-19 06/2019   . Depressive disorder, not elsewhere classified   . Edema   . Edema   . External hemorrhoids without mention of complication   . Gastric ulcer, unspecified as acute or chronic, without mention of hemorrhage, perforation, or obstruction   . GERD (gastroesophageal reflux disease)   . Hypertension   . Insomnia, unspecified   . Loss of weight   . Osteoarthrosis, unspecified whether generalized or localized, unspecified site   . Other protein-calorie malnutrition   . Pain in thoracic spine   . Personal history of fall   . Unspecified constipation   . Unspecified hereditary and idiopathic peripheral neuropathy   . Unspecified hypothyroidism   . Unspecified vitamin D deficiency   . Urinary frequency    Patient Active Problem List   Diagnosis Date Noted  . Severe episode of recurrent major depressive disorder, without psychotic features ([place]) 08/02/2019  . Complicated UTI (urinary tract infection) 06/15/2019  . Generalized weakness 06/15/2019  . Alzheimer's dementia with behavioral disturbance ([place]) 06/15/2019  . COVID-19 virus infection 06/15/2019  . BRBPR (bright red blood per rectum) 06/30/2017  . Colitis 06/30/2017  . Protein-calorie malnutrition, severe 11/13/2016  . Multiple falls   . Encephalopathy   . Agitation   . Slurred speech 11/10/2016  . Vitamin D deficiency 03/07/2015  . Hx of fracture of vertebral column 12/28/2014  . Constipation 07/27/2013  . Fracture of vertebra 07/27/2013  . Loss of weight 07/13/2013  . Peripheral vascular disease,  unspecified ([place]) 07/13/2013  . Epigastric mass 07/13/2013  . Dementia with behavioral disturbance ([place])   . Major depression ([place])   . Insomnia, unspecified   . Fall 09/07/2012  . Fracture of right distal radius 09/07/2012  . Hypothyroidism 08/18/2006  . HYPERCHOLESTEROLEMIA 08/18/2006  . Essential hypertension 08/18/2006  . Restless legs 08/18/2006   Home Medication(s) Prior to Admission medications   Medication Sig Start  Date End Date Taking? Authorizing Provider  acetaminophen (TYLENOL) 325 MG tablet Take 2 tablets (650 mg total) by mouth every 6 (six) hours as needed. 07/24/17   Jacqlyn Larsen, PA-C  amLODipine (NORVASC) 5 MG tablet Take 1 tablet (5 mg total) by mouth daily. 01/09/20   [name], NP  buPROPion (WELLBUTRIN XL) 150 MG 24 hr tablet Take one tablet by mouth once daily. 10/04/19   [name], NP  levothyroxine (SYNTHROID) 75 MCG tablet Take 1 tablet (75 mcg total) by mouth daily. 08/01/19   [name], NP  Memantine HCl-Donepezil HCl (NAMZARIC) 28-10 MG CP24 Take 1 capsule by mouth daily. 10/04/19   [name], NP  Probiotic Product (PROBIOTIC DAILY PO) Take 1 tablet by mouth daily.    [provider]  QUEtiapine (SEROQUEL) 25 MG tablet TAKE 1/2 TO 1 TABLET(12.5 TO 25 MG) BY MOUTH AT BEDTIME AS NEEDED 12/05/19   [name], NP  venlafaxine XR (EFFEXOR-XR) 75 MG 24 hr capsule Take one capsule by mouth once daily with breakfast. 10/04/19   [name], NP  Vitamin D, Ergocalciferol, (DRISDOL) 1.25 MG (50000 UNIT) CAPS capsule TAKE 1 CAPSULE BY MOUTH EVERY 14 DAYS 10/10/19   [name], NP                                                                                                                                    Past Surgical History Past Surgical History:  Procedure Laterality Date  . TEE WITHOUT CARDIOVERSION N/A 11/13/2016   Procedure: TRANSESOPHAGEAL ECHOCARDIOGRAM (TEE);  Surgeon: Jerline Pain, MD;  Location: [facility] ENDOSCOPY;  Service: Cardiovascular;  Laterality: N/A;  . WRIST SURGERY Right 2013   Family History Family History  Problem Relation Age of Onset  . Cancer Mother   . CVA Father   . Cancer Brother   . Alcohol abuse Sister     Social History Social History   Tobacco Use  . Smoking status: Never Smoker  . Smokeless tobacco: Never Used  Vaping Use  . Vaping Use: Never used  Substance Use Topics  . Alcohol use: No   . Drug use: No   Allergies Patient has no known allergies.  Review of Systems Review of Systems  Unable to perform ROS: Dementia    Physical Exam Vital Signs  I have reviewed the triage vital signs BP (!) 148/79   Pulse 68   Temp (!) 96.1 F (35.6 C) (Rectal)   Resp 19  SpO2 96%   Physical Exam Vitals reviewed.  Constitutional:      General: She is not in acute distress.    Appearance: She is well-developed and well-nourished. She is not diaphoretic.  HENT:     Head: Normocephalic and atraumatic.     Nose: Nose normal.  Eyes:     General: No scleral icterus.       Right eye: No discharge.        Left eye: No discharge.     Extraocular Movements: EOM normal.     Conjunctiva/sclera: Conjunctivae normal.     Pupils: Pupils are equal, round, and reactive to light.  Cardiovascular:     Rate and Rhythm: Normal rate and regular rhythm.     Heart sounds: No murmur heard. No friction rub. No gallop.   Pulmonary:     Effort: Pulmonary effort is normal. No respiratory distress.     Breath sounds: Normal breath sounds. No stridor. No rales.  Abdominal:     General: There is no distension.     Palpations: Abdomen is soft.     Tenderness: There is no abdominal tenderness.  Musculoskeletal:        General: No tenderness or edema.     Cervical back: Normal range of motion and neck supple.  Skin:    General: Skin is warm and dry.     Findings: No erythema or rash.  Neurological:     Mental Status: She is alert. She is disoriented.  Psychiatric:        Mood and Affect: Mood and affect normal.     ED Results and Treatments Labs (all labs ordered are listed, but only abnormal results are displayed) Labs Reviewed  CBC WITH DIFFERENTIAL/PLATELET - Abnormal; Notable for the following components:      Result Value   RBC 3.80 (*)    MCV 103.7 (*)    All other components within normal limits  COMPREHENSIVE METABOLIC PANEL - Abnormal; Notable for the following components:    Potassium 3.2 (*)    Glucose, Bld 142 (*)    Creatinine, Ser 1.33 (*)    Calcium 8.5 (*)    Total Protein 6.2 (*)    Albumin 3.3 (*)    GFR, Estimated 39 (*)    All other components within normal limits  CBG MONITORING, ED - Abnormal; Notable for the following components:   Glucose-Capillary 120 (*)    All other components within normal limits  I-STAT CHEM 8, ED - Abnormal; Notable for the following components:   Potassium 3.1 (*)    BUN 28 (*)    Creatinine, Ser 1.20 (*)    Glucose, Bld 138 (*)    Calcium, Ion 1.09 (*)    All other components within normal limits  LACTIC ACID, PLASMA  TROPONIN I (HIGH SENSITIVITY)  TROPONIN I (HIGH SENSITIVITY)  EKG  EKG Interpretation  Date/Time:  Wednesday February 01 2020 23:07:45 EST Ventricular Rate:  61 PR Interval:    QRS Duration: 114 QT Interval:  498 QTC Calculation: 502 R Axis:   52 Text Interpretation: Sinus rhythm Prolonged QT interval Otherwise no significant change Confirmed by Addison Lank [phone]) on 02/01/2020 11:40:11 PM      Radiology DG Chest Port 1 View  Result Date: 02/02/2020 CLINICAL DATA:  Syncope EXAM: PORTABLE CHEST 1 VIEW COMPARISON:  06/14/2019 FINDINGS: Cardiomegaly. Aortic arch calcifications. No edema or effusions. Biapical scarring. No acute bony abnormality. IMPRESSION: Cardiomegaly, aortic atherosclerosis. No active disease. Electronically Signed   By: Rolm Baptise M.D.   On: 02/02/2020 00:20    Pertinent labs & imaging results that were available during my care of the patient were reviewed by me and considered in my medical decision making (see chart for details).  Medications Ordered in ED Medications  sodium chloride 0.9 % bolus 1,000 mL (0 mLs Intravenous Stopped 02/02/20 0258)    Followed by  0.9 %  sodium chloride infusion (1,000 mLs Intravenous New Bag/Given 02/02/20  0302)                                                                                                                                    Procedures Procedures  (including critical care time)  Medical Decision Making / ED Course I have reviewed the nursing notes for this encounter and the patient's prior records (if available in EHR or on provided paperwork).   ROUX BRANDY was evaluated in Emergency Department on 02/02/2020 for the symptoms described in the history of present illness. She was evaluated in the context of the global COVID-19 pandemic, which necessitated consideration that the patient might be at risk for infection with the SARS-CoV-2 virus that causes COVID-19. Institutional protocols and algorithms that pertain to the evaluation of patients at risk for COVID-19 are in a state of rapid change based on information released by regulatory bodies including the CDC and federal and state organizations. These policies and algorithms were followed during the patient's care in the ED.  Patient presents after syncopal episode while having a bowel movement. Per caregiver, bowel movement was nonbloody. CBG within normal limits. EKG without acute ischemic changes or dysrhythmias. Initial troponin negative x2  CBC without leukocytosis or anemia. Metabolic panel with mild AKI. Mild hypokalemia.  No other significant electrolyte derangement. Lactic wnl.  Likely vasovagal related to diarrhea and dehydration. Patient was provided with additional IV fluids.  Patient was able to ambulate with minimal assistance at baseline with a walker.     Final Clinical Impression(s) / ED Diagnoses Final diagnoses:  Vasovagal syncope  Dehydration    The patient appears reasonably screened and/or stabilized for discharge and I doubt any other medical condition or other [facility] requiring further screening, evaluation, or treatment in the ED at this time prior to discharge. Safe for discharge  with strict  return precautions.  Disposition: Discharge  Condition: Good  I have discussed the results, Dx and Tx plan with the patient/family who expressed understanding and agree(s) with the plan. Discharge instructions discussed at length. The patient/family was given strict return precautions who verbalized understanding of the instructions. No further questions at time of discharge.    ED Discharge Orders    None        Follow Up: [name], NP [place] [place] [address] [phone]  Call  To schedule an appointment for close follow up     This chart was dictated using voice recognition software.  Despite best efforts to proofread,  errors can occur which can change the documentation meaning.   Fatima Blank, MD 02/02/20 [phone]

## 2020-02-01 NOTE — ED Triage Notes (Signed)
Pt arrived via [facility] from home after pt was having bowel movement at home and had a syncopal episode. Pt was minimally responsive on arrival by ems. Pt was more responsive on arrival to ER. Pt received 558ml of fluid by EMS.

## 2020-02-02 ENCOUNTER — Emergency Department (HOSPITAL_COMMUNITY): Payer: Medicare HMO

## 2020-02-02 DIAGNOSIS — I517 Cardiomegaly: Secondary | ICD-10-CM | POA: Diagnosis not present

## 2020-02-02 DIAGNOSIS — R55 Syncope and collapse: Secondary | ICD-10-CM | POA: Diagnosis not present

## 2020-02-02 LAB — LACTIC ACID, PLASMA: Lactic Acid, Venous: 0.8 mmol/L (ref 0.5–1.9)

## 2020-02-02 LAB — CBC WITH DIFFERENTIAL/PLATELET
Abs Immature Granulocytes: 0.05 10*3/uL (ref 0.00–0.07)
Basophils Absolute: 0.1 10*3/uL (ref 0.0–0.1)
Basophils Relative: 1 %
Eosinophils Absolute: 0.2 10*3/uL (ref 0.0–0.5)
Eosinophils Relative: 3 %
HCT: 39.4 % (ref 36.0–46.0)
Hemoglobin: 12.5 g/dL (ref 12.0–15.0)
Immature Granulocytes: 1 %
Lymphocytes Relative: 43 %
Lymphs Abs: 3.5 10*3/uL (ref 0.7–4.0)
MCH: 32.9 pg (ref 26.0–34.0)
MCHC: 31.7 g/dL (ref 30.0–36.0)
MCV: 103.7 fL — ABNORMAL HIGH (ref 80.0–100.0)
Monocytes Absolute: 0.6 10*3/uL (ref 0.1–1.0)
Monocytes Relative: 7 %
Neutro Abs: 3.7 10*3/uL (ref 1.7–7.7)
Neutrophils Relative %: 45 %
Platelets: 233 10*3/uL (ref 150–400)
RBC: 3.8 MIL/uL — ABNORMAL LOW (ref 3.87–5.11)
RDW: 14.2 % (ref 11.5–15.5)
WBC: 8 10*3/uL (ref 4.0–10.5)
nRBC: 0 % (ref 0.0–0.2)

## 2020-02-02 LAB — COMPREHENSIVE METABOLIC PANEL
ALT: 29 U/L (ref 0–44)
AST: 29 U/L (ref 15–41)
Albumin: 3.3 g/dL — ABNORMAL LOW (ref 3.5–5.0)
Alkaline Phosphatase: 94 U/L (ref 38–126)
Anion gap: 10 (ref 5–15)
BUN: 23 mg/dL (ref 8–23)
CO2: 23 mmol/L (ref 22–32)
Calcium: 8.5 mg/dL — ABNORMAL LOW (ref 8.9–10.3)
Chloride: 107 mmol/L (ref 98–111)
Creatinine, Ser: 1.33 mg/dL — ABNORMAL HIGH (ref 0.44–1.00)
GFR, Estimated: 39 mL/min — ABNORMAL LOW (ref >60)
Glucose, Bld: 142 mg/dL — ABNORMAL HIGH (ref 70–99)
Potassium: 3.2 mmol/L — ABNORMAL LOW (ref 3.5–5.1)
Sodium: 140 mmol/L (ref 135–145)
Total Bilirubin: 0.6 mg/dL (ref 0.3–1.2)
Total Protein: 6.2 g/dL — ABNORMAL LOW (ref 6.5–8.1)

## 2020-02-02 LAB — TROPONIN I (HIGH SENSITIVITY)
Troponin I (High Sensitivity): 12 ng/L (ref <18)
Troponin I (High Sensitivity): 8 ng/L (ref <18)

## 2020-02-02 NOTE — ED Notes (Signed)
Pt ambulated satisfactorily with a walker. Pt was also one assisted through entire process.

## 2020-02-02 NOTE — ED Notes (Signed)
Pt was trying to pull out IV, RN re taped it and secured it w/ covan.  Pt is alert.

## 2020-02-02 NOTE — ED Notes (Signed)
Full set of blood work sent to lab.

## 2020-02-02 NOTE — ED Notes (Signed)
Caregiver arrived, reports pt has baseline dementia, only ambulates w/ a walker.  Pt was sitting on the toliet, when they checked on her she was unresponsive on the

## 2020-02-02 NOTE — ED Notes (Signed)
Called caregiver, Skeet Simmer. She is coming to pick pt up.

## 2020-02-27 ENCOUNTER — Telehealth: Payer: Self-pay | Admitting: *Deleted

## 2020-02-27 DIAGNOSIS — R296 Repeated falls: Secondary | ICD-10-CM

## 2020-02-27 DIAGNOSIS — G309 Alzheimer's disease, unspecified: Secondary | ICD-10-CM

## 2020-02-27 DIAGNOSIS — R531 Weakness: Secondary | ICD-10-CM

## 2020-02-27 DIAGNOSIS — F0281 Dementia in other diseases classified elsewhere with behavioral disturbance: Secondary | ICD-10-CM

## 2020-02-27 NOTE — Telephone Encounter (Signed)
Generally with DME there has to be a visit associated with it. I will sign it but want to make the family aware.

## 2020-02-27 NOTE — Telephone Encounter (Signed)
Patient daughter, [name], called requesting a Hospital bed for patient with full bed rails. Stated that she spoke with [facility] and all they need is a Rx faxed to them at [place] for approval.

## 2020-02-27 NOTE — Telephone Encounter (Signed)
Patient daughter aware and agreed.  

## 2020-02-28 NOTE — Telephone Encounter (Signed)
Order signed and faxed to [facility] on 02/28/2020.

## 2020-03-09 ENCOUNTER — Telehealth: Payer: Self-pay

## 2020-03-09 NOTE — Telephone Encounter (Signed)
Incoming call received from patients daughter stating she called [facility] today and they have not received order for Hospital Bed.   [name] is asking that we resubmit to a different fax number [phone], fax number was repeated to confirm recorded with accuracy.   Working remotely, we will need to resubmit on Monday 03/12/2020 when we return to office.

## 2020-03-12 NOTE — Telephone Encounter (Signed)
Mickel Baas with [facility] called and stated that she received the order for the Hospital bed but they need OV notes supporting the need for hospital bed. Needs faxed to [phone]  I called patient and spoke with daughter, [name] and scheduled an appointment for 03/14/2020 for face to face.

## 2020-03-12 NOTE — Telephone Encounter (Signed)
Order faxed to number provided by patients daughter

## 2020-03-14 ENCOUNTER — Ambulatory Visit (INDEPENDENT_AMBULATORY_CARE_PROVIDER_SITE_OTHER): Payer: Medicare HMO | Admitting: Nurse Practitioner

## 2020-03-14 ENCOUNTER — Other Ambulatory Visit: Payer: Self-pay

## 2020-03-14 ENCOUNTER — Encounter: Payer: Self-pay | Admitting: Nurse Practitioner

## 2020-03-14 VITALS — BP 138/80 | HR 82 | Temp 96.0°F | Ht 64.0 in | Wt 139.0 lb

## 2020-03-14 DIAGNOSIS — F0281 Dementia in other diseases classified elsewhere with behavioral disturbance: Secondary | ICD-10-CM | POA: Diagnosis not present

## 2020-03-14 DIAGNOSIS — G309 Alzheimer's disease, unspecified: Secondary | ICD-10-CM

## 2020-03-14 DIAGNOSIS — R296 Repeated falls: Secondary | ICD-10-CM

## 2020-03-14 DIAGNOSIS — R531 Weakness: Secondary | ICD-10-CM

## 2020-03-14 NOTE — Progress Notes (Signed)
Careteam: Patient Care Team: [name], NP as PCP - General (Geriatric Medicine) Berneta Sages, NP as Nurse Practitioner (Hospice and Palliative Medicine)  PLACE OF SERVICE:  [place] Directive information Does Patient Have a Medical Advance Directive?: Yes, Type of Advance Directive: Out of facility DNR (pink MOST or yellow form), Pre-existing out of facility DNR order (yellow form or pink MOST form): Pink MOST form placed in chart (order not valid for inpatient use);Yellow form placed in chart (order not valid for inpatient use), Does patient want to make changes to medical advance directive?: No - Patient declined  No Known Allergies  Chief Complaint  Patient presents with  . Acute Visit    Face to face for hospital bed. Patient c/o left arm pain since fall 3 weeks ago. Review most form and provide another copy      HPI: Patient is a 87 y.o. female to discuss a hospital bed.   She is very unsteady on her feet. She will walk around the house without her walker and then walk out and went to the road at night.   Generally needs help walking but occasionally will get up and walk by herself  Has caregivers during the day but can not afford one over night. Insurance not covering caregivers.   Fell 3 weeks ago and hurt left shoulder.     Review of Systems:  Review of Systems  Unable to perform ROS: Dementia    Past Medical History:  Diagnosis Date  . Abnormality of gait   . Alzheimer's disease ([place])   . Calculus of kidney   . Closed fracture of seventh cervical vertebra without mention of spinal cord injury   . COVID-19 06/2019  . Depressive disorder, not elsewhere classified   . Edema   . Edema   . External hemorrhoids without mention of complication   . Gastric ulcer, unspecified as acute or chronic, without mention of hemorrhage, perforation, or obstruction   . GERD (gastroesophageal reflux disease)   . Hypertension   . Insomnia, unspecified    . Loss of weight   . Osteoarthrosis, unspecified whether generalized or localized, unspecified site   . Other protein-calorie malnutrition   . Pain in thoracic spine   . Personal history of fall   . Unspecified constipation   . Unspecified hereditary and idiopathic peripheral neuropathy   . Unspecified hypothyroidism   . Unspecified vitamin D deficiency   . Urinary frequency    Past Surgical History:  Procedure Laterality Date  . TEE WITHOUT CARDIOVERSION N/A 11/13/2016   Procedure: TRANSESOPHAGEAL ECHOCARDIOGRAM (TEE);  Surgeon: Jerline Pain, MD;  Location: [facility] ENDOSCOPY;  Service: Cardiovascular;  Laterality: N/A;  . WRIST SURGERY Right 2013   Social History:   reports that she has never smoked. She has never used smokeless tobacco. She reports that she does not drink alcohol and does not use drugs.  Family History  Problem Relation Age of Onset  . Cancer Mother   . CVA Father   . Cancer Brother   . Alcohol abuse Sister     Medications: Patient's Medications  New Prescriptions   No medications on file  Previous Medications   ACETAMINOPHEN (TYLENOL) 325 MG TABLET    Take 2 tablets (650 mg total) by mouth every 6 (six) hours as needed.   AMLODIPINE (NORVASC) 5 MG TABLET    Take 1 tablet (5 mg total) by mouth daily.   BUPROPION (WELLBUTRIN XL) 150 MG 24  HR TABLET    Take one tablet by mouth once daily.   LEVOTHYROXINE (SYNTHROID) 75 MCG TABLET    Take 1 tablet (75 mcg total) by mouth daily.   MEMANTINE HCL-DONEPEZIL HCL (NAMZARIC) 28-10 MG CP24    Take 1 capsule by mouth daily.   QUETIAPINE (SEROQUEL) 25 MG TABLET    TAKE 1/2 TO 1 TABLET(12.5 TO 25 MG) BY MOUTH AT BEDTIME AS NEEDED   VENLAFAXINE XR (EFFEXOR-XR) 75 MG 24 HR CAPSULE    Take one capsule by mouth once daily with breakfast.   VITAMIN D, ERGOCALCIFEROL, (DRISDOL) 1.25 MG (50000 UNIT) CAPS CAPSULE    TAKE 1 CAPSULE BY MOUTH EVERY 14 DAYS  Modified Medications   No medications on file  Discontinued Medications    PROBIOTIC PRODUCT (PROBIOTIC DAILY PO)    Take 1 tablet by mouth daily.    Physical Exam:  Vitals:   03/14/20 1555  BP: 138/80  Pulse: 82  Temp: (!) 96 F (35.6 C)  TempSrc: Temporal  SpO2: 98%  Weight: 139 lb (63 kg)  Height: 5\' 4"  (1.626 m)   Body mass index is 23.86 kg/m. Wt Readings from Last 3 Encounters:  03/14/20 139 lb (63 kg)  11/16/19 140 lb (63.5 kg)  11/02/19 140 lb (63.5 kg)    Physical Exam Constitutional:      Appearance: She is underweight.  Cardiovascular:     Rate and Rhythm: Normal rate and regular rhythm.  Pulmonary:     Effort: Pulmonary effort is normal.     Breath sounds: Normal breath sounds.  Skin:    General: Skin is warm and dry.  Neurological:     Mental Status: She is easily aroused. Mental status is at baseline. She is lethargic.     Gait: Gait abnormal (slow shuffling gait).     Labs reviewed: Basic Metabolic Panel: Recent Labs    06/16/19 0109 06/17/19 0725 07/29/19 1606 11/02/19 1505 02/01/20 0137 02/01/20 2355  NA 138   < > 142 141 140 142  K 3.1*   < > 4.1 4.7 3.2* 3.1*  CL 104   < > 103 106 107 106  CO2 21*   < > 29 26 23   --   GLUCOSE 121*   < > 137 104 142* 138*  BUN 14   < > 19 28* 23 28*  CREATININE 0.66   < > 1.01* 1.17* 1.33* 1.20*  CALCIUM 8.4*   < > 9.5 8.9 8.5*  --   MG 1.8  --   --   --   --   --   TSH 0.227*  --  0.25* 3.45  --   --    < > = values in this interval not displayed.   Liver Function Tests: Recent Labs    06/18/19 0418 06/19/19 0544 07/29/19 1606 11/02/19 1505 02/01/20 0137  AST 31 44* 18 21 29   ALT 22 32 12 23 29   ALKPHOS 85 88  --   --  94  BILITOT 0.6 0.8 0.4 0.3 0.6  PROT 6.5 6.7 6.0* 6.8 6.2*  ALBUMIN 3.7 3.8  --   --  3.3*   No results for input(s): LIPASE, AMYLASE in the last 8760 hours. No results for input(s): AMMONIA in the last 8760 hours. CBC: Recent Labs    07/29/19 1606 11/02/19 1505 02/01/20 0137 02/01/20 2355  WBC 7.5 8.1 8.0  --   NEUTROABS 5,138 5,330  3.7  --   HGB 12.9 12.4  12.5 12.6  HCT 37.7 37.1 39.4 37.0  MCV 103.0* 98.7 103.7*  --   PLT 275 309 233  --    Lipid Panel: Recent Labs    06/15/19 0405  CHOL 191  HDL 63  LDLCALC 115*  TRIG 64  CHOLHDL 3.0   TSH: Recent Labs    06/16/19 0109 07/29/19 1606 11/02/19 1505  TSH 0.227* 0.25* 3.45   A1C: Lab Results  Component Value Date   HGBA1C 5.2 11/11/2016     Assessment/Plan 1. Alzheimer's dementia with behavioral disturbance, unspecified timing of dementia onset ([place]) ongoing 2. Recurrent falls Due to gait instability and weakness, complicated due to dementia and not remembering her walker.  3. Generalized weakness Very weak however she is still able to get up and walk around. Encouraged to use the walker. She does not qualify for a hospital bed- they were hoping this could keep her in the bed at night. Educated daughter that hospital bed would not keep her from getting out of bed. 24/7 care is needed. She does live with her daughter but she is bed bound.  Finances play a role in providing 24 hour care. Have looked in SNF but can not afford this as well. Encouraged to continue to look for options that provide around the clock care.   Carlos American. [place], [facility] [phone]

## 2020-03-19 NOTE — Telephone Encounter (Signed)
Received fax from [facility] [phone] Fax: [phone] stating that in order to complete referral they need patient's height, Narrative for both bed and overlay included in OV note.  Documentation that must be included placed in [facility] folder to review and add to her note dated 03/14/2020.

## 2020-03-19 NOTE — Telephone Encounter (Signed)
Jessica placed fax from [facility] for Hospital bed back on my desk with note stating that Patient Does Not Qualify for the Hospital bed and Gel Overlay.   Faxed back to [facility] with it noted.

## 2020-04-03 ENCOUNTER — Other Ambulatory Visit: Payer: Self-pay | Admitting: Nurse Practitioner

## 2020-04-03 DIAGNOSIS — G301 Alzheimer's disease with late onset: Secondary | ICD-10-CM

## 2020-04-03 DIAGNOSIS — F332 Major depressive disorder, recurrent severe without psychotic features: Secondary | ICD-10-CM

## 2020-04-03 DIAGNOSIS — F0281 Dementia in other diseases classified elsewhere with behavioral disturbance: Secondary | ICD-10-CM

## 2020-05-23 ENCOUNTER — Other Ambulatory Visit: Payer: Medicare HMO | Admitting: Hospice

## 2020-05-23 ENCOUNTER — Other Ambulatory Visit: Payer: Self-pay

## 2020-05-23 DIAGNOSIS — R296 Repeated falls: Secondary | ICD-10-CM | POA: Diagnosis not present

## 2020-05-23 DIAGNOSIS — Z515 Encounter for palliative care: Secondary | ICD-10-CM

## 2020-05-23 DIAGNOSIS — F0391 Unspecified dementia with behavioral disturbance: Secondary | ICD-10-CM

## 2020-05-23 DIAGNOSIS — R1310 Dysphagia, unspecified: Secondary | ICD-10-CM

## 2020-05-23 NOTE — Progress Notes (Signed)
[name] Consult Note Telephone: [phone]  Fax: [phone]  PATIENT NAME: [name] DOB: 06-18-1931 MRN: [id number]  PRIMARY CARE PROVIDER:   [name], NP [name], NP [place],  [place] [address]  REFERRING PROVIDER: [name], NP [name], NP [place],  [place] [address]  RESPONSIBLE PARTY:Primary Emergency Contact: [name],[name] Address: [address] [place], [place] [address] [place] Phone: [phone] Relation: Daughter Secondary Emergency Contact: Bottomley,Marsha Address: [address] [place], [place] [address] [place] Phone: [phone] Relation: Daughter [name] - caregiver [phone]  TELEHEALTH VISIT STATEMENT Due to the COVID-19 crisis, this visit was done via telemedicine and it was initiated and consent by this patient and or family. Video-audio (telehealth) contact was unable to be done due to technical barriers from the patient's side.  Visit is to build trust and highlight Palliative Medicine as specialized medical care for people living with serious illness, aimed at facilitating better quality of life through symptoms relief, assisting with advance care plan and establishing goals of care.  Daughter [name] and caregiver [name] were present during visit.  CHIEF COMPLAINT: Palliative focused visit/Dysphagia  RECOMMENDATIONS/PLAN:   1. Advance Care Planning/Code Status: CODE STATUS reviewed.  Patient remains a DO NOT RESUSCITATE. Patient has MOST form in epic. MOST form selections include DO NOT RESUSCITATE determine use of antibiotics, IV fluids for defined trial., Limited additional intervention, no feeding tube.Family is open to discussions onhospice service when patient qualifies for it  2. Goals of Care: Goals of care include to maximize quality of life and symptom  management.  Visit consisted of counseling and education dealing with the complex and emotionally intense issues of symptom management and palliative care in the setting of serious and potentially life-threatening illness. Palliative care team will continue to support patient, patient's family, and medical team.  I spent 16 minutes providing this consultation. More than 50% of the time in this consultation was spent on coordinating communication.  -------------------------------------------------------------------------------------------------------------------------------------------------- 3. Symptom management/Plan:  Difficulty swallowing: Swallowing precautions discussed- sitting up during meals and not lay down for at least 30 mins after meals, small bites and sips of fluid during meals, provide enough time for feeding.  Downgrade to mechanical soft diet if difficulty with regular texture.  Patient will benefit from ST consult; [name] said she will work with the swallow precautions and incorporate mechanical soft diet for now. If it persists she will be open to ST consult and would call PCP with request.    Recurrent Falls: Patient is a high fall risk. Falls precautions discussed with patient and her caregiver [name]. Encourage use of rolling walker at all times for ambulation. PT/OT recommended for gait training and strengthening.  NP corresponded with PCP via Epic  secured message  with recommendation for in home PT/OT.  Dementia: Ongoing decline in cognitive and functional decline, FAST 6C.  Agitation related to Dementia, managed with Seroquel. Continue supportive care.  Palliative will continue to monitor for symptom management/decline and make recommendations as needed. Return 2 months or prn. Encouraged to call provider sooner with any concerns.   HISTORY OF PRESENT ILLNESS:  [name] is a 87 y.o. female with multiple medical problems including acute difficulty swallowing with coughing  and often trying to clear throat during meals, and this impairs her feeding. This started in the past month and worse in the past week. She now  prefers finger foods and holding unto her sippy cup during meals.  Daughter reported patient not coughing as much when given milk.  Also, Patient with chronic gait abnormality, worsening in the past month resulting in frequent falls, last fall was 5 days ago with no injuries.  Rolling walker is helpful for support and to help prevent fall but patient often forgets to use it.  History of Alzheimer dementia, frequent falls, abnormality of gait, PVD.  History obtained from review of EMR, discussion with primary team, and  interview with family, caregiver  and/or patient. Records reviewed and summarized above. All 10 point systems reviewed and are negative except as documented in history of present illness above  Review and summarization of Epic records shows history from other than patient.   Palliative Care was asked to follow this patient by consultation request of [name], NP to help address complex decision making in the context of advance care planning and goals of care clarification.   CODE STATUS: DNR  PPS: 40%  HOSPICE ELIGIBILITY/DIAGNOSIS: TBD  PAST MEDICAL HISTORY:  Past Medical History:  Diagnosis Date  . Abnormality of gait   . Alzheimer's disease ([place])   . Calculus of kidney   . Closed fracture of seventh cervical vertebra without mention of spinal cord injury   . COVID-19 06/2019  . Depressive disorder, not elsewhere classified   . Edema   . Edema   . External hemorrhoids without mention of complication   . Gastric ulcer, unspecified as acute or chronic, without mention of hemorrhage, perforation, or obstruction   . GERD (gastroesophageal reflux disease)   . Hypertension   . Insomnia, unspecified   . Loss of weight   . Osteoarthrosis, unspecified whether generalized or localized, unspecified site   . Other protein-calorie  malnutrition   . Pain in thoracic spine   . Personal history of fall   . Unspecified constipation   . Unspecified hereditary and idiopathic peripheral neuropathy   . Unspecified hypothyroidism   . Unspecified vitamin D deficiency   . Urinary frequency      SOCIAL HX: @SOCX  Patient lives at home with her daughter [name] for ongoing care   FAMILY HX:  Family History  Problem Relation Age of Onset  . Cancer Mother   . CVA Father   . Cancer Brother   . Alcohol abuse Sister     Review lab tests/diagnostics Results for ALLISEN, PIDGEON (MRN [id number]) as of 05/23/2020 10:41  Ref. Range 02/01/2020 01:37  Sodium Latest Ref Range: 135 - 145 mmol/L 140  Potassium Latest Ref Range: 3.5 - 5.1 mmol/L 3.2 (L)  Chloride Latest Ref Range: 98 - 111 mmol/L 107  CO2 Latest Ref Range: 22 - 32 mmol/L 23  Glucose Latest Ref Range: 70 - 99 mg/dL 142 (H)  BUN Latest Ref Range: 8 - 23 mg/dL 23  Creatinine Latest Ref Range: 0.44 - 1.00 mg/dL 1.33 (H)  Calcium Latest Ref Range: 8.9 - 10.3 mg/dL 8.5 (L)  Anion gap Latest Ref Range: 5 - 15  10  Alkaline Phosphatase Latest Ref Range: 38 - 126 U/L 94  Albumin Latest Ref Range: 3.5 - 5.0 g/dL 3.3 (L)  AST Latest Ref Range: 15 - 41 U/L 29  ALT Latest Ref Range: 0 - 44 U/L 29  Total Protein Latest Ref Range: 6.5 - 8.1 g/dL 6.2 (L)  Total Bilirubin Latest Ref Range: 0.3 - 1.2 mg/dL 0.6  GFR, Estimated Latest Ref Range: >60 mL/min 39 (L)  Troponin I (High Sensitivity) Latest Ref Range: <18 ng/L 12  WBC Latest Ref Range: 4.0 - 10.5 K/uL 8.0  RBC Latest Ref Range: 3.87 - 5.11 MIL/uL 3.80 (L)  Hemoglobin Latest Ref Range: 12.0 - 15.0 g/dL 12.5  HCT Latest Ref Range: 36.0 - 46.0 % 39.4  MCV Latest Ref Range: 80.0 - 100.0 fL 103.7 (H)  MCH Latest Ref Range: 26.0 - 34.0 pg 32.9  MCHC Latest Ref Range: 30.0 - 36.0 g/dL 31.7  RDW Latest Ref Range: 11.5 - 15.5 % 14.2  Platelets Latest Ref Range: 150 - 400 K/uL 233  nRBC Latest Ref Range: 0.0 - 0.2 % 0.0   Neutrophils Latest Units: % 45  Lymphocytes Latest Units: % 43  Monocytes Relative Latest Units: % 7  Eosinophil Latest Units: % 3  Basophil Latest Units: % 1  Immature Granulocytes Latest Units: % 1  NEUT# Latest Ref Range: 1.7 - 7.7 K/uL 3.7  Lymphocyte # Latest Ref Range: 0.7 - 4.0 K/uL 3.5  Monocyte # Latest Ref Range: 0.1 - 1.0 K/uL 0.6  Eosinophils Absolute Latest Ref Range: 0.0 - 0.5 K/uL 0.2  Basophils Absolute Latest Ref Range: 0.0 - 0.1 K/uL 0.1  Abs Immature Granulocytes Latest Ref Range: 0.00 - 0.07 K/uL 0.05   ALLERGIES: No Known Allergies    PERTINENT MEDICATIONS:  Outpatient Encounter Medications as of 05/23/2020  Medication Sig  . acetaminophen (TYLENOL) 325 MG tablet Take 2 tablets (650 mg total) by mouth every 6 (six) hours as needed.  Marland Kitchen amLODipine (NORVASC) 5 MG tablet Take 1 tablet (5 mg total) by mouth daily.  Marland Kitchen buPROPion (WELLBUTRIN XL) 150 MG 24 hr tablet TAKE 1 TABLET BY MOUTH EVERY DAY  . levothyroxine (SYNTHROID) 75 MCG tablet Take 1 tablet (75 mcg total) by mouth daily.  Marland Kitchen NAMZARIC 28-10 MG CP24 TAKE 1 CAPSULE BY MOUTH DAILY  . QUEtiapine (SEROQUEL) 25 MG tablet TAKE 1/2 TO 1 TABLET(12.5 TO 25 MG) BY MOUTH AT BEDTIME AS NEEDED  . venlafaxine XR (EFFEXOR-XR) 75 MG 24 hr capsule TAKE 1 CAPSULE BY MOUTH EVERY DAY WITH BREAKFAST  . Vitamin D, Ergocalciferol, (DRISDOL) 1.25 MG (50000 UNIT) CAPS capsule TAKE 1 CAPSULE BY MOUTH EVERY 14 DAYS   No facility-administered encounter medications on file as of 05/23/2020.   Physical exam deferred due to telehealth medicine.  Thank you for the opportunity to participate in the care of YUMI INSALACO Please call our office at [phone] if we can be of additional assistance.  Note: Portions of this note were generated with Lobbyist. Dictation errors may occur despite best attempts at proofreading.  Teodoro Spray, NP

## 2020-06-30 ENCOUNTER — Other Ambulatory Visit: Payer: Self-pay | Admitting: Nurse Practitioner

## 2020-06-30 DIAGNOSIS — E039 Hypothyroidism, unspecified: Secondary | ICD-10-CM

## 2020-07-02 ENCOUNTER — Other Ambulatory Visit: Payer: Self-pay | Admitting: Nurse Practitioner

## 2020-07-12 ENCOUNTER — Other Ambulatory Visit: Payer: Self-pay | Admitting: Nurse Practitioner

## 2020-10-02 ENCOUNTER — Other Ambulatory Visit: Payer: Self-pay | Admitting: Nurse Practitioner

## 2020-10-02 DIAGNOSIS — G301 Alzheimer's disease with late onset: Secondary | ICD-10-CM

## 2020-10-02 DIAGNOSIS — F332 Major depressive disorder, recurrent severe without psychotic features: Secondary | ICD-10-CM

## 2020-10-02 DIAGNOSIS — F0281 Dementia in other diseases classified elsewhere with behavioral disturbance: Secondary | ICD-10-CM

## 2020-10-02 NOTE — Telephone Encounter (Signed)
Palliative Care is seeing patient in home. Pended Rx's and sent to [facility] for approval.

## 2020-10-03 ENCOUNTER — Other Ambulatory Visit: Payer: Self-pay | Admitting: Nurse Practitioner

## 2020-11-22 ENCOUNTER — Telehealth: Payer: Self-pay | Admitting: *Deleted

## 2020-11-22 DIAGNOSIS — G301 Alzheimer's disease with late onset: Secondary | ICD-10-CM

## 2020-11-22 MED ORDER — DONEPEZIL HCL 10 MG PO TABS
10.0000 mg | ORAL_TABLET | Freq: Every day | ORAL | 3 refills | Status: DC
Start: 2020-11-22 — End: 2021-02-17

## 2020-11-22 MED ORDER — MEMANTINE HCL 10 MG PO TABS: 10.0000 mg | ORAL_TABLET | Freq: Two times a day (BID) | ORAL | 3 refills | Status: AC

## 2020-11-22 NOTE — Telephone Encounter (Signed)
Sent medications in separately which will hopefully be more affordable.

## 2020-11-22 NOTE — Telephone Encounter (Signed)
Patient daughter, [name], called and stated that patient is in [place] and cannot afford the Memory Medication , Namzaric. Stated that it is costing her $400/month.   Calling to see if we had samples to get her through but no samples available.   Please Advise.

## 2020-11-23 NOTE — Telephone Encounter (Signed)
Daughter notified and agreed.  

## 2020-12-28 ENCOUNTER — Telehealth: Payer: Self-pay

## 2020-12-28 NOTE — Telephone Encounter (Signed)
Called and left message for daughter [name] to call office to schedule routine follow up as well as an AWV.

## 2021-01-22 ENCOUNTER — Ambulatory Visit (INDEPENDENT_AMBULATORY_CARE_PROVIDER_SITE_OTHER): Payer: Medicare HMO | Admitting: Nurse Practitioner

## 2021-01-22 ENCOUNTER — Other Ambulatory Visit: Payer: Self-pay

## 2021-01-22 ENCOUNTER — Telehealth: Payer: Self-pay

## 2021-01-22 ENCOUNTER — Encounter: Payer: Self-pay | Admitting: Nurse Practitioner

## 2021-01-22 DIAGNOSIS — Z0000 Encounter for general adult medical examination without abnormal findings: Secondary | ICD-10-CM

## 2021-01-22 NOTE — Progress Notes (Signed)
Subjective:   [name] is a 87 y.o. female who presents for Medicare Annual (Subsequent) preventive examination.  Review of Systems     Cardiac Risk Factors include: advanced age (>61men, >65 women)     Objective:    There were no vitals filed for this visit. There is no height or weight on file to calculate BMI.  Advanced Directives 01/22/2021 03/14/2020 10/25/2019 09/29/2019 06/14/2019 06/14/2019 10/15/2018  Does Patient Have a Medical Advance Directive? Yes Yes Yes Yes Unable to assess, patient is non-responsive or altered mental status Yes Yes  Type of Advance Directive Out of facility DNR (pink MOST or yellow form) Out of facility DNR (pink MOST or yellow form) Out of facility DNR (pink MOST or yellow form) [place];Out of facility DNR (pink MOST or yellow form) - Living will;Healthcare Power of [place];Out of facility DNR (pink MOST or yellow form) Out of facility DNR (pink MOST or yellow form);Living will  Does patient want to make changes to medical advance directive? No - Patient declined No - Patient declined No - Patient declined No - Patient declined Yes (ED - Information included in AVS) No - Patient declined No - Guardian declined  Copy of [place] in Chart? - - (No Data) Yes - validated most recent copy scanned in chart (See row information) No - copy requested Yes - validated most recent copy scanned in chart (See row information) -  Would patient like information on creating a medical advance directive? - - - - - - -  Pre-existing out of facility DNR order (yellow form or pink MOST form) Pink MOST form placed in chart (order not valid for inpatient use);Yellow form placed in chart (order not valid for inpatient use) Pink MOST form placed in chart (order not valid for inpatient use);Yellow form placed in chart (order not valid for inpatient use) Yellow form placed in chart (order not valid for inpatient use);Pink MOST form placed in chart  (order not valid for inpatient use) - - - Yellow form placed in chart (order not valid for inpatient use);Pink MOST form placed in chart (order not valid for inpatient use)    Current Medications (verified) Outpatient Encounter Medications as of 01/22/2021  Medication Sig   acetaminophen (TYLENOL) 325 MG tablet Take 2 tablets (650 mg total) by mouth every 6 (six) hours as needed.   amLODipine (NORVASC) 5 MG tablet TAKE 1 TABLET(5 MG) BY MOUTH DAILY   buPROPion (WELLBUTRIN XL) 150 MG 24 hr tablet TAKE 1 TABLET BY MOUTH EVERY DAY   donepezil (ARICEPT) 10 MG tablet Take 1 tablet (10 mg total) by mouth at bedtime.   levothyroxine (SYNTHROID) 75 MCG tablet TAKE 1 TABLET(75 MCG) BY MOUTH DAILY   memantine (NAMENDA) 10 MG tablet Take 1 tablet (10 mg total) by mouth 2 (two) times daily.   QUEtiapine (SEROQUEL) 25 MG tablet TAKE 1/2 TO 1 TABLET(12.5 TO 25 MG) BY MOUTH AT BEDTIME AS NEEDED   venlafaxine XR (EFFEXOR-XR) 75 MG 24 hr capsule TAKE 1 CAPSULE BY MOUTH EVERY DAY WITH BREAKFAST   Vitamin D, Ergocalciferol, (DRISDOL) 1.25 MG (50000 UNIT) CAPS capsule TAKE 1 CAPSULE BY MOUTH EVERY 14 DAYS   No facility-administered encounter medications on file as of 01/22/2021.    Allergies (verified) Patient has no known allergies.   History: Past Medical History:  Diagnosis Date   Abnormality of gait    Alzheimer's disease ([place])    Calculus of kidney  Closed fracture of seventh cervical vertebra without mention of spinal cord injury    COVID-19 06/2019   Depressive disorder, not elsewhere classified    Edema    Edema    External hemorrhoids without mention of complication    Gastric ulcer, unspecified as acute or chronic, without mention of hemorrhage, perforation, or obstruction    GERD (gastroesophageal reflux disease)    Hypertension    Insomnia, unspecified    Loss of weight    Osteoarthrosis, unspecified whether generalized or localized, unspecified site    Other protein-calorie  malnutrition    Pain in thoracic spine    Personal history of fall    Unspecified constipation    Unspecified hereditary and idiopathic peripheral neuropathy    Unspecified hypothyroidism    Unspecified vitamin D deficiency    Urinary frequency    Past Surgical History:  Procedure Laterality Date   TEE WITHOUT CARDIOVERSION N/A 11/13/2016   Procedure: TRANSESOPHAGEAL ECHOCARDIOGRAM (TEE);  Surgeon: Jerline Pain, MD;  Location: [facility] ENDOSCOPY;  Service: Cardiovascular;  Laterality: N/A;   WRIST SURGERY Right 2013   Family History  Problem Relation Age of Onset   Cancer Mother    CVA Father    Cancer Brother    Alcohol abuse Sister    Social History   Socioeconomic History   Marital status: Widowed    Spouse name: Not on file   Number of children: Not on file   Years of education: Not on file   Highest education level: Not on file  Occupational History   Not on file  Tobacco Use   Smoking status: Never   Smokeless tobacco: Never  Vaping Use   Vaping Use: Never used  Substance and Sexual Activity   Alcohol use: No   Drug use: No   Sexual activity: Never  Other Topics Concern   Not on file  Social History Narrative   Not on file   Social Determinants of Health   Financial Resource Strain: Not on file  Food Insecurity: Not on file  Transportation Needs: Not on file  Physical Activity: Not on file  Stress: Not on file  Social Connections: Not on file    Tobacco Counseling Counseling given: Not Answered   Clinical Intake:  Pre-visit preparation completed: Yes  Pain : No/denies pain     BMI - recorded: 23.8 Diabetes: No  How often do you need to have someone help you when you read instructions, pamphlets, or other written materials from your doctor or pharmacy?: 5 - Always  Diabetic?no         Activities of Daily Living In your present state of health, do you have any difficulty performing the following activities: 01/22/2021  Hearing? N   Vision? N  Difficulty concentrating or making decisions? Y  Walking or climbing stairs? Y  Comment unsteady  Dressing or bathing? Y  Comment needs assistance  Doing errands, shopping? Y  Preparing Food and eating ? Y  Comment does not prepare food  Using the Toilet? N  In the past six months, have you accidently leaked urine? Y  Do you have problems with loss of bowel control? N  Managing your Medications? Y  Managing your Finances? Y  Housekeeping or managing your Housekeeping? Y  Some recent data might be hidden    Patient Care Team: [name], NP as PCP - General (Geriatric Medicine) Berneta Sages, NP as Nurse Practitioner [facility])  Indicate any recent Medical  Services you may have received from other than [facility] providers in the past year (date may be approximate).     Assessment:   This is a routine wellness examination for [place].  Hearing/Vision screen Hearing Screening - Comments:: No hearing problems. Vision Screening - Comments:: Patient has glasses but doesn't wear them. No eye exam within past year.  Dietary issues and exercise activities discussed: Current Exercise Habits: The patient does not participate in regular exercise at present   Goals Addressed   None    Depression Screen PHQ 2/9 Scores 01/22/2021 09/29/2019 03/11/2018 03/06/2017 03/06/2017 12/10/2016 11/27/2015  PHQ - 2 Score 0 0 0 0 0 0 6  PHQ- 9 Score - - - - - - 18    Fall Risk Fall Risk  01/22/2021 03/14/2020 11/02/2019 10/25/2019 09/29/2019  Falls in the past year? 1 1 1 1 1   Number falls in past yr: 1 1 1  (No Data) 1  Comment - - - 4 -  Injury with Fall? 0 0 0 1 0  Comment - - - Skin tears, loses balance. Patient had severe nose bleed from recent fall and has gotten black eye from previoud falls per daughter  Risk Factor Category  - - - - -  Risk for fall due to : No Fall Risks History of fall(s);Impaired balance/gait History of fall(s);Impaired balance/gait - -   Follow up Falls evaluation completed - - - -    FALL RISK PREVENTION PERTAINING TO THE HOME:  Any stairs in or around the home? No  If so, are there any without handrails? No  Home free of loose throw rugs in walkways, pet beds, electrical cords, etc? Yes  Adequate lighting in your home to reduce risk of falls? Yes   ASSISTIVE DEVICES UTILIZED TO PREVENT FALLS:  Life alert? No  Use of a cane, walker or w/c? Yes  Grab bars in the bathroom? Yes  Shower chair or bench in shower? Yes  Elevated toilet seat or a handicapped toilet? No   TIMED UP AND GO:  Was the test performed? No .    Cognitive Function: MMSE - Mini Mental State Exam 03/11/2018 03/06/2017 11/27/2015 03/30/2014  Orientation to time 0 0 0 2  Orientation to Place 0 2 2 2   Registration 3 3 3 3   Attention/ Calculation 0 4 0 0  Recall 0 0 0 0  Language- name 2 objects 2 2 2 2   Language- repeat 1 1 1 1   Language- follow 3 step command 2 3 3 3   Language- read & follow direction 1 1 1 1   Write a sentence 1 1 1  0  Copy design 0 1 0 0  Total score 10 18 13 14         Immunizations Immunization History  Administered Date(s) Administered   Fluad Quad(high Dose 65+) 11/02/2019   Influenza, High Dose Seasonal PF 12/10/2016   Influenza,inj,Quad PF,6+ Mos 11/27/2015   PFIZER(Purple Top)SARS-COV-2 Vaccination 06/03/2019, 09/30/2019   PPD Test 01/22/2015   Pneumococcal Conjugate-13 11/27/2015   Pneumococcal Polysaccharide-23 03/06/2017   Tdap 05/19/2018    TDAP status: Up to date  Flu Vaccine status: Up to date  Pneumococcal vaccine status: Up to date  Covid-19 vaccine status: Information provided on how to obtain vaccines.   Qualifies for Shingles Vaccine? Yes   Zostavax completed No   Shingrix Completed?: No.    Education has been provided regarding the importance of this vaccine. Patient has been advised to call insurance company to determine  out of pocket expense if they have not yet received this vaccine.  Advised may also receive vaccine at local pharmacy or Health Dept. Verbalized acceptance and understanding.  Screening Tests Health Maintenance  Topic Date Due   Zoster Vaccines- Shingrix (1 of 2) Never done   COVID-19 Vaccine (3 - Booster for Pfizer series) 11/25/2019   INFLUENZA VACCINE  09/17/2020   TETANUS/TDAP  05/18/2028   Pneumonia Vaccine 35+ Years old  Completed   DEXA SCAN  Completed   HPV VACCINES  Aged Out    Health Maintenance  Health Maintenance Due  Topic Date Due   Zoster Vaccines- Shingrix (1 of 2) Never done   COVID-19 Vaccine (3 - Booster for Pfizer series) 11/25/2019   INFLUENZA VACCINE  09/17/2020    Colorectal cancer screening: No longer required.   Mammogram status: No longer required due to age.  Declined bone density   Lung Cancer Screening: (Low Dose CT Chest recommended if Age 4-80 years, 30 pack-year currently smoking OR have quit w/in 15years.) does not qualify.   Lung Cancer Screening Referral: na  Additional Screening:  Hepatitis C Screening: does not qualify; Completed   Vision Screening: Recommended annual ophthalmology exams for early detection of glaucoma and other disorders of the eye. Is the patient up to date with their annual eye exam?  No  Who is the provider or what is the name of the office in which the patient attends annual eye exams? Does not have eye doctor If pt is not established with a provider, would they like to be referred to a provider to establish care? No .   Dental Screening: Recommended annual dental exams for proper oral hygiene  Community Resource Referral / Chronic Care Management: CRR required this visit?  No   CCM required this visit?  No      Plan:     I have personally reviewed and noted the following in the patient's chart:   Medical and social history Use of alcohol, tobacco or illicit drugs  Current medications and supplements including opioid prescriptions.  Functional ability and  status Nutritional status Physical activity Advanced directives List of other physicians Hospitalizations, surgeries, and ER visits in previous 12 months Vitals Screenings to include cognitive, depression, and falls Referrals and appointments  In addition, I have reviewed and discussed with patient certain preventive protocols, quality metrics, and best practice recommendations. A written personalized care plan for preventive services as well as general preventive health recommendations were provided to patient.     [name], NP   01/22/2021    Virtual Visit via Telephone Note  I connected withNAME@ on 01/22/21 at 11:00 AM EST by telephone and verified that I am speaking with the correct person using two identifiers.  Location: Patient: home Provider: [facility]   I discussed the limitations, risks, security and privacy concerns of performing an evaluation and management service by telephone and the availability of in person appointments. I also discussed with the patient that there may be a patient responsible charge related to this service. The patient expressed understanding and agreed to proceed.   I discussed the assessment and treatment plan with the patient. The patient was provided an opportunity to ask questions and all were answered. The patient agreed with the plan and demonstrated an understanding of the instructions.   The patient was advised to call back or seek an in-person evaluation if the symptoms worsen or if the condition fails to improve as anticipated.  I  provided 14 minutes of non-face-to-face time during this encounter.  Carlos American. Harle Battiest Avs printed and mailed

## 2021-01-22 NOTE — Telephone Encounter (Signed)
[name], abbey are scheduled for a virtual visit with your provider today.    Just as we do with appointments in the office, we must obtain your consent to participate.  Your consent will be active for this visit and any virtual visit you may have with one of our providers in the next 365 days.    If you have a MyChart account, I can also send a copy of this consent to you electronically.  All virtual visits are billed to your insurance company just like a traditional visit in the office.  As this is a virtual visit, video technology does not allow for your provider to perform a traditional examination.  This may limit your provider's ability to fully assess your condition.  If your provider identifies any concerns that need to be evaluated in person or the need to arrange testing such as labs, EKG, etc, we will make arrangements to do so.    Although advances in technology are sophisticated, we cannot ensure that it will always work on either your end or our end.  If the connection with a video visit is poor, we may have to switch to a telephone visit.  With either a video or telephone visit, we are not always able to ensure that we have a secure connection.   I need to obtain your verbal consent now.   Are you willing to proceed with your visit today?   [name] has provided verbal consent on 01/22/2021 for a virtual visit (video or telephone).   Carroll Kinds, CMA 01/22/2021  11:15 AM

## 2021-01-22 NOTE — Patient Instructions (Signed)
[name] , Thank you for taking time to come for your Medicare Wellness Visit. I appreciate your ongoing commitment to your health goals. Please review the following plan we discussed and let me know if I can assist you in the future.   Screening recommendations/referrals: Colonoscopy aged out Mammogram aged out Bone Density declined Recommended yearly ophthalmology/optometry visit for glaucoma screening and checkup Recommended yearly dental visit for hygiene and checkup  Vaccinations: Influenza vaccine recommended at this time Pneumococcal vaccine up to date Tdap vaccine RECOMMENDED- to get at pharmacy Shingles vaccine RECOMMENDED- to get at pharmacy    Advanced directives: on file  Conditions/risks identified: fracture, high fall risk  Next appointment: yearly for awv   Preventive Care 20 Years and Older, Female Preventive care refers to lifestyle choices and visits with your health care provider that can promote health and wellness. What does preventive care include? A yearly physical exam. This is also called an annual well check. Dental exams once or twice a year. Routine eye exams. Ask your health care provider how often you should have your eyes checked. Personal lifestyle choices, including: Daily care of your teeth and gums. Regular physical activity. Eating a healthy diet. Avoiding tobacco and drug use. Limiting alcohol use. Practicing safe sex. Taking low-dose aspirin every day. Taking vitamin and mineral supplements as recommended by your health care provider. What happens during an annual well check? The services and screenings done by your health care provider during your annual well check will depend on your age, overall health, lifestyle risk factors, and family history of disease. Counseling  Your health care provider may ask you questions about your: Alcohol use. Tobacco use. Drug use. Emotional well-being. Home and relationship well-being. Sexual  activity. Eating habits. History of falls. Memory and ability to understand (cognition). Work and work Statistician. Reproductive health. Screening  You may have the following tests or measurements: Height, weight, and BMI. Blood pressure. Lipid and cholesterol levels. These may be checked every 5 years, or more frequently if you are over 35 years old. Skin check. Lung cancer screening. You may have this screening every year starting at age 58 if you have a 30-pack-year history of smoking and currently smoke or have quit within the past 15 years. Fecal occult blood test (FOBT) of the stool. You may have this test every year starting at age 53. Flexible sigmoidoscopy or colonoscopy. You may have a sigmoidoscopy every 5 years or a colonoscopy every 10 years starting at age 96. Hepatitis C blood test. Hepatitis B blood test. Sexually transmitted disease (STD) testing. Diabetes screening. This is done by checking your blood sugar (glucose) after you have not eaten for a while (fasting). You may have this done every 1-3 years. Bone density scan. This is done to screen for osteoporosis. You may have this done starting at age 22. Mammogram. This may be done every 1-2 years. Talk to your health care provider about how often you should have regular mammograms. Talk with your health care provider about your test results, treatment options, and if necessary, the need for more tests. Vaccines  Your health care provider may recommend certain vaccines, such as: Influenza vaccine. This is recommended every year. Tetanus, diphtheria, and acellular pertussis (Tdap, Td) vaccine. You may need a Td booster every 10 years. Zoster vaccine. You may need this after age 29. Pneumococcal 13-valent conjugate (PCV13) vaccine. One dose is recommended after age 61. Pneumococcal polysaccharide (PPSV23) vaccine. One dose is recommended after age 45. Talk to  your health care provider about which screenings and vaccines  you need and how often you need them. This information is not intended to replace advice given to you by your health care provider. Make sure you discuss any questions you have with your health care provider. Document Released: 03/02/2015 Document Revised: 10/24/2015 Document Reviewed: 12/05/2014 Elsevier Interactive Patient Education  2017 [place] Prevention in the Home Falls can cause injuries. They can happen to people of all ages. There are many things you can do to make your home safe and to help prevent falls. What can I do on the outside of my home? Regularly fix the edges of walkways and driveways and fix any cracks. Remove anything that might make you trip as you walk through a door, such as a raised step or threshold. Trim any bushes or trees on the path to your home. Use bright outdoor lighting. Clear any walking paths of anything that might make someone trip, such as rocks or tools. Regularly check to see if handrails are loose or broken. Make sure that both sides of any steps have handrails. Any raised decks and porches should have guardrails on the edges. Have any leaves, snow, or ice cleared regularly. Use sand or salt on walking paths during winter. Clean up any spills in your garage right away. This includes oil or grease spills. What can I do in the bathroom? Use night lights. Install grab bars by the toilet and in the tub and shower. Do not use towel bars as grab bars. Use non-skid mats or decals in the tub or shower. If you need to sit down in the shower, use a plastic, non-slip stool. Keep the floor dry. Clean up any water that spills on the floor as soon as it happens. Remove soap buildup in the tub or shower regularly. Attach bath mats securely with double-sided non-slip rug tape. Do not have throw rugs and other things on the floor that can make you trip. What can I do in the bedroom? Use night lights. Make sure that you have a light by your bed that  is easy to reach. Do not use any sheets or blankets that are too big for your bed. They should not hang down onto the floor. Have a firm chair that has side arms. You can use this for support while you get dressed. Do not have throw rugs and other things on the floor that can make you trip. What can I do in the kitchen? Clean up any spills right away. Avoid walking on wet floors. Keep items that you use a lot in easy-to-reach places. If you need to reach something above you, use a strong step stool that has a grab bar. Keep electrical cords out of the way. Do not use floor polish or wax that makes floors slippery. If you must use wax, use non-skid floor wax. Do not have throw rugs and other things on the floor that can make you trip. What can I do with my stairs? Do not leave any items on the stairs. Make sure that there are handrails on both sides of the stairs and use them. Fix handrails that are broken or loose. Make sure that handrails are as long as the stairways. Check any carpeting to make sure that it is firmly attached to the stairs. Fix any carpet that is loose or worn. Avoid having throw rugs at the top or bottom of the stairs. If you do have throw rugs, attach  them to the floor with carpet tape. Make sure that you have a light switch at the top of the stairs and the bottom of the stairs. If you do not have them, ask someone to add them for you. What else can I do to help prevent falls? Wear shoes that: Do not have high heels. Have rubber bottoms. Are comfortable and fit you well. Are closed at the toe. Do not wear sandals. If you use a stepladder: Make sure that it is fully opened. Do not climb a closed stepladder. Make sure that both sides of the stepladder are locked into place. Ask someone to hold it for you, if possible. Clearly mark and make sure that you can see: Any grab bars or handrails. First and last steps. Where the edge of each step is. Use tools that help you  move around (mobility aids) if they are needed. These include: Canes. Walkers. Scooters. Crutches. Turn on the lights when you go into a dark area. Replace any light bulbs as soon as they burn out. Set up your furniture so you have a clear path. Avoid moving your furniture around. If any of your floors are uneven, fix them. If there are any pets around you, be aware of where they are. Review your medicines with your doctor. Some medicines can make you feel dizzy. This can increase your chance of falling. Ask your doctor what other things that you can do to help prevent falls. This information is not intended to replace advice given to you by your health care provider. Make sure you discuss any questions you have with your health care provider. Document Released: 11/30/2008 Document Revised: 07/12/2015 Document Reviewed: 03/10/2014 Elsevier Interactive Patient Education  2017 [employer].

## 2021-01-22 NOTE — Progress Notes (Signed)
This service is provided via telemedicine  No vital signs collected/recorded due to the encounter was a telemedicine visit.   Location of patient (ex: home, work):  Home  Patient consents to a telephone visit:  Yes, see encounter dated 01/22/2021  Location of the provider (ex: office, home):  [place]  Name of any referring provider:  N/A  Names of all persons participating in the telemedicine service and their role in the encounter:  Sherrie Mustache, Nurse Practitioner, Carroll Kinds, CMA, and patient.   Time spent on call:  12 minutes with medical assistant

## 2021-02-13 ENCOUNTER — Emergency Department (HOSPITAL_COMMUNITY): Payer: Medicare HMO

## 2021-02-13 ENCOUNTER — Inpatient Hospital Stay (HOSPITAL_COMMUNITY)
Admission: EM | Admit: 2021-02-13 | Discharge: 2021-02-17 | DRG: 871 | Disposition: A | Discharge status: Hospice | Payer: Medicare HMO | Attending: Internal Medicine | Admitting: Internal Medicine

## 2021-02-13 DIAGNOSIS — Z7401 Bed confinement status: Secondary | ICD-10-CM

## 2021-02-13 DIAGNOSIS — E876 Hypokalemia: Secondary | ICD-10-CM | POA: Diagnosis present

## 2021-02-13 DIAGNOSIS — N390 Urinary tract infection, site not specified: Secondary | ICD-10-CM

## 2021-02-13 DIAGNOSIS — A419 Sepsis, unspecified organism: Principal | ICD-10-CM | POA: Diagnosis present

## 2021-02-13 DIAGNOSIS — B9620 Unspecified Escherichia coli [E. coli] as the cause of diseases classified elsewhere: Secondary | ICD-10-CM | POA: Diagnosis present

## 2021-02-13 DIAGNOSIS — Z87442 Personal history of urinary calculi: Secondary | ICD-10-CM | POA: Diagnosis not present

## 2021-02-13 DIAGNOSIS — E039 Hypothyroidism, unspecified: Secondary | ICD-10-CM | POA: Diagnosis present

## 2021-02-13 DIAGNOSIS — F02818 Dementia in other diseases classified elsewhere, unspecified severity, with other behavioral disturbance: Secondary | ICD-10-CM | POA: Diagnosis present

## 2021-02-13 DIAGNOSIS — R638 Other symptoms and signs concerning food and fluid intake: Secondary | ICD-10-CM | POA: Diagnosis not present

## 2021-02-13 DIAGNOSIS — F02811 Dementia in other diseases classified elsewhere, unspecified severity, with agitation: Secondary | ICD-10-CM | POA: Diagnosis present

## 2021-02-13 DIAGNOSIS — E559 Vitamin D deficiency, unspecified: Secondary | ICD-10-CM | POA: Diagnosis present

## 2021-02-13 DIAGNOSIS — R54 Age-related physical debility: Secondary | ICD-10-CM | POA: Diagnosis present

## 2021-02-13 DIAGNOSIS — I1310 Hypertensive heart and chronic kidney disease without heart failure, with stage 1 through stage 4 chronic kidney disease, or unspecified chronic kidney disease: Secondary | ICD-10-CM | POA: Diagnosis present

## 2021-02-13 DIAGNOSIS — R404 Transient alteration of awareness: Secondary | ICD-10-CM | POA: Diagnosis not present

## 2021-02-13 DIAGNOSIS — Z66 Do not resuscitate: Secondary | ICD-10-CM | POA: Diagnosis present

## 2021-02-13 DIAGNOSIS — J9601 Acute respiratory failure with hypoxia: Secondary | ICD-10-CM | POA: Diagnosis present

## 2021-02-13 DIAGNOSIS — R6520 Severe sepsis without septic shock: Secondary | ICD-10-CM | POA: Diagnosis not present

## 2021-02-13 DIAGNOSIS — I10 Essential (primary) hypertension: Secondary | ICD-10-CM | POA: Diagnosis not present

## 2021-02-13 DIAGNOSIS — I672 Cerebral atherosclerosis: Secondary | ICD-10-CM | POA: Diagnosis not present

## 2021-02-13 DIAGNOSIS — I517 Cardiomegaly: Secondary | ICD-10-CM | POA: Diagnosis not present

## 2021-02-13 DIAGNOSIS — N3001 Acute cystitis with hematuria: Secondary | ICD-10-CM

## 2021-02-13 DIAGNOSIS — Z515 Encounter for palliative care: Secondary | ICD-10-CM | POA: Diagnosis not present

## 2021-02-13 DIAGNOSIS — W19XXXA Unspecified fall, initial encounter: Secondary | ICD-10-CM | POA: Diagnosis not present

## 2021-02-13 DIAGNOSIS — N1832 Chronic kidney disease, stage 3b: Secondary | ICD-10-CM | POA: Diagnosis present

## 2021-02-13 DIAGNOSIS — Z79899 Other long term (current) drug therapy: Secondary | ICD-10-CM

## 2021-02-13 DIAGNOSIS — R4020 Unspecified coma: Secondary | ICD-10-CM | POA: Diagnosis not present

## 2021-02-13 DIAGNOSIS — G609 Hereditary and idiopathic neuropathy, unspecified: Secondary | ICD-10-CM | POA: Diagnosis present

## 2021-02-13 DIAGNOSIS — Z8616 Personal history of COVID-19: Secondary | ICD-10-CM | POA: Diagnosis not present

## 2021-02-13 DIAGNOSIS — K219 Gastro-esophageal reflux disease without esophagitis: Secondary | ICD-10-CM | POA: Diagnosis present

## 2021-02-13 DIAGNOSIS — Z20822 Contact with and (suspected) exposure to covid-19: Secondary | ICD-10-CM | POA: Diagnosis present

## 2021-02-13 DIAGNOSIS — I6523 Occlusion and stenosis of bilateral carotid arteries: Secondary | ICD-10-CM | POA: Diagnosis not present

## 2021-02-13 DIAGNOSIS — K449 Diaphragmatic hernia without obstruction or gangrene: Secondary | ICD-10-CM | POA: Diagnosis not present

## 2021-02-13 DIAGNOSIS — R55 Syncope and collapse: Secondary | ICD-10-CM | POA: Diagnosis not present

## 2021-02-13 DIAGNOSIS — F32A Depression, unspecified: Secondary | ICD-10-CM | POA: Diagnosis present

## 2021-02-13 DIAGNOSIS — R5383 Other fatigue: Secondary | ICD-10-CM | POA: Diagnosis not present

## 2021-02-13 DIAGNOSIS — N179 Acute kidney failure, unspecified: Secondary | ICD-10-CM | POA: Diagnosis not present

## 2021-02-13 DIAGNOSIS — F03918 Unspecified dementia, unspecified severity, with other behavioral disturbance: Secondary | ICD-10-CM | POA: Diagnosis present

## 2021-02-13 DIAGNOSIS — G9341 Metabolic encephalopathy: Secondary | ICD-10-CM | POA: Diagnosis present

## 2021-02-13 DIAGNOSIS — Z79890 Hormone replacement therapy (postmenopausal): Secondary | ICD-10-CM | POA: Diagnosis not present

## 2021-02-13 DIAGNOSIS — R5381 Other malaise: Secondary | ICD-10-CM | POA: Diagnosis not present

## 2021-02-13 DIAGNOSIS — R4182 Altered mental status, unspecified: Secondary | ICD-10-CM | POA: Diagnosis not present

## 2021-02-13 DIAGNOSIS — G319 Degenerative disease of nervous system, unspecified: Secondary | ICD-10-CM | POA: Diagnosis not present

## 2021-02-13 DIAGNOSIS — R519 Headache, unspecified: Secondary | ICD-10-CM | POA: Diagnosis not present

## 2021-02-13 DIAGNOSIS — R9431 Abnormal electrocardiogram [ECG] [EKG]: Secondary | ICD-10-CM

## 2021-02-13 DIAGNOSIS — N183 Chronic kidney disease, stage 3 unspecified: Secondary | ICD-10-CM

## 2021-02-13 DIAGNOSIS — J9691 Respiratory failure, unspecified with hypoxia: Secondary | ICD-10-CM

## 2021-02-13 DIAGNOSIS — R0681 Apnea, not elsewhere classified: Secondary | ICD-10-CM | POA: Diagnosis not present

## 2021-02-13 DIAGNOSIS — G309 Alzheimer's disease, unspecified: Secondary | ICD-10-CM | POA: Diagnosis present

## 2021-02-13 DIAGNOSIS — R0689 Other abnormalities of breathing: Secondary | ICD-10-CM | POA: Diagnosis not present

## 2021-02-13 DIAGNOSIS — Z7189 Other specified counseling: Secondary | ICD-10-CM | POA: Diagnosis not present

## 2021-02-13 LAB — CBC WITH DIFFERENTIAL/PLATELET
Abs Immature Granulocytes: 0.07 10*3/uL (ref 0.00–0.07)
Basophils Absolute: 0.1 10*3/uL (ref 0.0–0.1)
Basophils Relative: 1 %
Eosinophils Absolute: 0.1 10*3/uL (ref 0.0–0.5)
Eosinophils Relative: 1 %
HCT: 43.3 % (ref 36.0–46.0)
Hemoglobin: 14.5 g/dL (ref 12.0–15.0)
Immature Granulocytes: 1 %
Lymphocytes Relative: 29 %
Lymphs Abs: 3.5 10*3/uL (ref 0.7–4.0)
MCH: 35 pg — ABNORMAL HIGH (ref 26.0–34.0)
MCHC: 33.5 g/dL (ref 30.0–36.0)
MCV: 104.6 fL — ABNORMAL HIGH (ref 80.0–100.0)
Monocytes Absolute: 0.7 10*3/uL (ref 0.1–1.0)
Monocytes Relative: 6 %
Neutro Abs: 7.4 10*3/uL (ref 1.7–7.7)
Neutrophils Relative %: 62 %
Platelets: 204 10*3/uL (ref 150–400)
RBC: 4.14 MIL/uL (ref 3.87–5.11)
RDW: 12.7 % (ref 11.5–15.5)
WBC: 11.8 10*3/uL — ABNORMAL HIGH (ref 4.0–10.5)
nRBC: 0 % (ref 0.0–0.2)

## 2021-02-13 LAB — AMMONIA: Ammonia: 21 umol/L (ref 9–35)

## 2021-02-13 LAB — I-STAT VENOUS BLOOD GAS, ED
Acid-Base Excess: 1 mmol/L (ref 0.0–2.0)
Bicarbonate: 25 mmol/L (ref 20.0–28.0)
Calcium, Ion: 1.05 mmol/L — ABNORMAL LOW (ref 1.15–1.40)
HCT: 42 % (ref 36.0–46.0)
Hemoglobin: 14.3 g/dL (ref 12.0–15.0)
O2 Saturation: 99 %
Potassium: 2.9 mmol/L — ABNORMAL LOW (ref 3.5–5.1)
Sodium: 142 mmol/L (ref 135–145)
TCO2: 26 mmol/L (ref 22–32)
pCO2, Ven: 37.7 mmHg — ABNORMAL LOW (ref 44.0–60.0)
pH, Ven: 7.429 (ref 7.250–7.430)
pO2, Ven: 140 mmHg — ABNORMAL HIGH (ref 32.0–45.0)

## 2021-02-13 LAB — COMPREHENSIVE METABOLIC PANEL
ALT: 12 U/L (ref 0–44)
AST: 17 U/L (ref 15–41)
Albumin: 3.8 g/dL (ref 3.5–5.0)
Alkaline Phosphatase: 75 U/L (ref 38–126)
Anion gap: 11 (ref 5–15)
BUN: 19 mg/dL (ref 8–23)
CO2: 22 mmol/L (ref 22–32)
Calcium: 8.9 mg/dL (ref 8.9–10.3)
Chloride: 106 mmol/L (ref 98–111)
Creatinine, Ser: 1.43 mg/dL — ABNORMAL HIGH (ref 0.44–1.00)
GFR, Estimated: 35 mL/min — ABNORMAL LOW (ref >60)
Glucose, Bld: 162 mg/dL — ABNORMAL HIGH (ref 70–99)
Potassium: 2.9 mmol/L — ABNORMAL LOW (ref 3.5–5.1)
Sodium: 139 mmol/L (ref 135–145)
Total Bilirubin: 0.8 mg/dL (ref 0.3–1.2)
Total Protein: 6.4 g/dL — ABNORMAL LOW (ref 6.5–8.1)

## 2021-02-13 LAB — ETHANOL: Alcohol, Ethyl (B): <10 mg/dL (ref <10)

## 2021-02-13 LAB — CBG MONITORING, ED: Glucose-Capillary: 149 mg/dL — ABNORMAL HIGH (ref 70–99)

## 2021-02-13 LAB — LACTIC ACID, PLASMA: Lactic Acid, Venous: 2.4 mmol/L (ref 0.5–1.9)

## 2021-02-13 NOTE — ED Notes (Addendum)
Visitor at bedside at this time.  

## 2021-02-13 NOTE — ED Triage Notes (Addendum)
Pt bib EMS from home for unresponsiveness. Pt was bathing herself, A&Ox4, and she collapsed after this, per EMS. Pt has DNR at the bedside. EMS delivering manual respirations for periods of apnea, no noted significant decreases in oxygen saturations. NPA in L nare by EMS  EMS: CBG 212 HR 70s 112/64

## 2021-02-13 NOTE — ED Notes (Signed)
Pt transported to CT with RN and monitor.

## 2021-02-13 NOTE — ED Notes (Signed)
Attempted to transition pt from BVM to NRB. Pt had no respiratory effort. MD and RT at bedside. Continuing to assist respirations with BVM.

## 2021-02-13 NOTE — ED Notes (Signed)
Two attempts at blood cultures by [facility] TRN without success.

## 2021-02-13 NOTE — ED Notes (Signed)
Attempted to obtain blood cultures - unsuccessful

## 2021-02-13 NOTE — ED Notes (Signed)
Respiratory therapist suctioning pt between manual respirations. Pt alert at this time.

## 2021-02-13 NOTE — ED Notes (Signed)
Unable to obtain temp at this time. 

## 2021-02-13 NOTE — ED Notes (Signed)
Family at bedside. 

## 2021-02-14 ENCOUNTER — Other Ambulatory Visit: Payer: Self-pay

## 2021-02-14 ENCOUNTER — Inpatient Hospital Stay (HOSPITAL_COMMUNITY): Payer: Medicare HMO

## 2021-02-14 ENCOUNTER — Emergency Department (HOSPITAL_COMMUNITY): Payer: Medicare HMO

## 2021-02-14 ENCOUNTER — Encounter (HOSPITAL_COMMUNITY): Payer: Self-pay | Admitting: Family Medicine

## 2021-02-14 DIAGNOSIS — E876 Hypokalemia: Secondary | ICD-10-CM | POA: Diagnosis present

## 2021-02-14 DIAGNOSIS — K219 Gastro-esophageal reflux disease without esophagitis: Secondary | ICD-10-CM | POA: Diagnosis present

## 2021-02-14 DIAGNOSIS — R9431 Abnormal electrocardiogram [ECG] [EKG]: Secondary | ICD-10-CM

## 2021-02-14 DIAGNOSIS — F02811 Dementia in other diseases classified elsewhere, unspecified severity, with agitation: Secondary | ICD-10-CM | POA: Diagnosis present

## 2021-02-14 DIAGNOSIS — B9620 Unspecified Escherichia coli [E. coli] as the cause of diseases classified elsewhere: Secondary | ICD-10-CM | POA: Diagnosis present

## 2021-02-14 DIAGNOSIS — R6520 Severe sepsis without septic shock: Secondary | ICD-10-CM | POA: Diagnosis not present

## 2021-02-14 DIAGNOSIS — G609 Hereditary and idiopathic neuropathy, unspecified: Secondary | ICD-10-CM | POA: Diagnosis present

## 2021-02-14 DIAGNOSIS — J9601 Acute respiratory failure with hypoxia: Secondary | ICD-10-CM

## 2021-02-14 DIAGNOSIS — R4182 Altered mental status, unspecified: Secondary | ICD-10-CM | POA: Diagnosis not present

## 2021-02-14 DIAGNOSIS — F32A Depression, unspecified: Secondary | ICD-10-CM | POA: Diagnosis present

## 2021-02-14 DIAGNOSIS — A419 Sepsis, unspecified organism: Secondary | ICD-10-CM | POA: Diagnosis present

## 2021-02-14 DIAGNOSIS — N390 Urinary tract infection, site not specified: Secondary | ICD-10-CM | POA: Diagnosis present

## 2021-02-14 DIAGNOSIS — F03918 Unspecified dementia, unspecified severity, with other behavioral disturbance: Secondary | ICD-10-CM | POA: Diagnosis not present

## 2021-02-14 DIAGNOSIS — Z7189 Other specified counseling: Secondary | ICD-10-CM

## 2021-02-14 DIAGNOSIS — Z515 Encounter for palliative care: Secondary | ICD-10-CM

## 2021-02-14 DIAGNOSIS — Z79890 Hormone replacement therapy (postmenopausal): Secondary | ICD-10-CM | POA: Diagnosis not present

## 2021-02-14 DIAGNOSIS — R638 Other symptoms and signs concerning food and fluid intake: Secondary | ICD-10-CM | POA: Diagnosis not present

## 2021-02-14 DIAGNOSIS — Z79899 Other long term (current) drug therapy: Secondary | ICD-10-CM | POA: Diagnosis not present

## 2021-02-14 DIAGNOSIS — N183 Chronic kidney disease, stage 3 unspecified: Secondary | ICD-10-CM

## 2021-02-14 DIAGNOSIS — G309 Alzheimer's disease, unspecified: Secondary | ICD-10-CM | POA: Diagnosis present

## 2021-02-14 DIAGNOSIS — R5381 Other malaise: Secondary | ICD-10-CM | POA: Diagnosis not present

## 2021-02-14 DIAGNOSIS — N3001 Acute cystitis with hematuria: Secondary | ICD-10-CM

## 2021-02-14 DIAGNOSIS — R54 Age-related physical debility: Secondary | ICD-10-CM | POA: Diagnosis present

## 2021-02-14 DIAGNOSIS — E559 Vitamin D deficiency, unspecified: Secondary | ICD-10-CM | POA: Diagnosis present

## 2021-02-14 DIAGNOSIS — Z8616 Personal history of COVID-19: Secondary | ICD-10-CM | POA: Diagnosis not present

## 2021-02-14 DIAGNOSIS — Z66 Do not resuscitate: Secondary | ICD-10-CM | POA: Diagnosis present

## 2021-02-14 DIAGNOSIS — I1310 Hypertensive heart and chronic kidney disease without heart failure, with stage 1 through stage 4 chronic kidney disease, or unspecified chronic kidney disease: Secondary | ICD-10-CM | POA: Diagnosis present

## 2021-02-14 DIAGNOSIS — Z87442 Personal history of urinary calculi: Secondary | ICD-10-CM | POA: Diagnosis not present

## 2021-02-14 DIAGNOSIS — Z20822 Contact with and (suspected) exposure to covid-19: Secondary | ICD-10-CM | POA: Diagnosis present

## 2021-02-14 DIAGNOSIS — G9341 Metabolic encephalopathy: Secondary | ICD-10-CM | POA: Diagnosis present

## 2021-02-14 DIAGNOSIS — N1832 Chronic kidney disease, stage 3b: Secondary | ICD-10-CM | POA: Diagnosis present

## 2021-02-14 DIAGNOSIS — N179 Acute kidney failure, unspecified: Secondary | ICD-10-CM

## 2021-02-14 DIAGNOSIS — Z7401 Bed confinement status: Secondary | ICD-10-CM | POA: Diagnosis not present

## 2021-02-14 DIAGNOSIS — E039 Hypothyroidism, unspecified: Secondary | ICD-10-CM | POA: Diagnosis present

## 2021-02-14 DIAGNOSIS — J9691 Respiratory failure, unspecified with hypoxia: Secondary | ICD-10-CM

## 2021-02-14 DIAGNOSIS — F02818 Dementia in other diseases classified elsewhere, unspecified severity, with other behavioral disturbance: Secondary | ICD-10-CM | POA: Diagnosis present

## 2021-02-14 DIAGNOSIS — R5383 Other fatigue: Secondary | ICD-10-CM | POA: Diagnosis not present

## 2021-02-14 LAB — BASIC METABOLIC PANEL
Anion gap: 8 (ref 5–15)
BUN: 15 mg/dL (ref 8–23)
CO2: 24 mmol/L (ref 22–32)
Calcium: 8.1 mg/dL — ABNORMAL LOW (ref 8.9–10.3)
Chloride: 103 mmol/L (ref 98–111)
Creatinine, Ser: 1.1 mg/dL — ABNORMAL HIGH (ref 0.44–1.00)
GFR, Estimated: 49 mL/min — ABNORMAL LOW (ref >60)
Glucose, Bld: 101 mg/dL — ABNORMAL HIGH (ref 70–99)
Potassium: 3.6 mmol/L (ref 3.5–5.1)
Sodium: 135 mmol/L (ref 135–145)

## 2021-02-14 LAB — CBC
HCT: 40.8 % (ref 36.0–46.0)
Hemoglobin: 13.6 g/dL (ref 12.0–15.0)
MCH: 34.7 pg — ABNORMAL HIGH (ref 26.0–34.0)
MCHC: 33.3 g/dL (ref 30.0–36.0)
MCV: 104.1 fL — ABNORMAL HIGH (ref 80.0–100.0)
Platelets: 173 10*3/uL (ref 150–400)
RBC: 3.92 MIL/uL (ref 3.87–5.11)
RDW: 12.7 % (ref 11.5–15.5)
WBC: 9.4 10*3/uL (ref 4.0–10.5)
nRBC: 0 % (ref 0.0–0.2)

## 2021-02-14 LAB — URINALYSIS, ROUTINE W REFLEX MICROSCOPIC
Bilirubin Urine: NEGATIVE
Glucose, UA: NEGATIVE mg/dL
Hgb urine dipstick: NEGATIVE
Ketones, ur: NEGATIVE mg/dL
Nitrite: POSITIVE — AB
Protein, ur: 30 mg/dL — AB
Specific Gravity, Urine: 1.016 (ref 1.005–1.030)
WBC, UA: >50 WBC/hpf — ABNORMAL HIGH (ref 0–5)
pH: 5 (ref 5.0–8.0)

## 2021-02-14 LAB — GLUCOSE, CAPILLARY: Glucose-Capillary: 96 mg/dL (ref 70–99)

## 2021-02-14 LAB — CBG MONITORING, ED
Glucose-Capillary: 94 mg/dL (ref 70–99)
Glucose-Capillary: 80 mg/dL (ref 70–99)

## 2021-02-14 LAB — RESP PANEL BY RT-PCR (FLU A&B, COVID) ARPGX2
Influenza A by PCR: NEGATIVE
Influenza B by PCR: NEGATIVE
SARS Coronavirus 2 by RT PCR: NEGATIVE

## 2021-02-14 LAB — RAPID URINE DRUG SCREEN, HOSP PERFORMED
Amphetamines: NOT DETECTED
Barbiturates: NOT DETECTED
Benzodiazepines: NOT DETECTED
Cocaine: NOT DETECTED
Opiates: NOT DETECTED
Tetrahydrocannabinol: NOT DETECTED

## 2021-02-14 LAB — ECHOCARDIOGRAM COMPLETE
Area-P 1/2: 3.89 cm2
Height: 64 in
Weight: 2222.24 oz

## 2021-02-14 LAB — TSH
TSH: 17.945 u[IU]/mL — ABNORMAL HIGH (ref 0.350–4.500)
TSH: 3.476 u[IU]/mL (ref 0.350–4.500)

## 2021-02-14 LAB — MAGNESIUM: Magnesium: 2.3 mg/dL (ref 1.7–2.4)

## 2021-02-14 LAB — LACTIC ACID, PLASMA
Lactic Acid, Venous: 1.5 mmol/L (ref 0.5–1.9)
Lactic Acid, Venous: 1.4 mmol/L (ref 0.5–1.9)

## 2021-02-14 LAB — T4, FREE: Free T4: 1.21 ng/dL — ABNORMAL HIGH (ref 0.61–1.12)

## 2021-02-14 LAB — AMMONIA: Ammonia: 30 umol/L (ref 9–35)

## 2021-02-14 MED ORDER — LACTATED RINGERS IV BOLUS
1000.0000 mL | Freq: Once | INTRAVENOUS | Status: AC
  Administered 2021-02-14: 01:00:00 1000 mL via INTRAVENOUS

## 2021-02-14 MED ORDER — MAGNESIUM SULFATE 2 GM/50ML IV SOLN
2.0000 g | Freq: Once | INTRAVENOUS | Status: AC
  Administered 2021-02-14: 2 g via INTRAVENOUS
  Filled 2021-02-14: qty 50

## 2021-02-14 MED ORDER — LEVOTHYROXINE SODIUM 75 MCG PO TABS
75.0000 ug | ORAL_TABLET | Freq: Every day | ORAL | Status: DC
  Administered 2021-02-15 – 2021-02-17 (×3): 75 ug via ORAL
  Filled 2021-02-14 (×3): qty 1

## 2021-02-14 MED ORDER — BUPROPION HCL ER (XL) 150 MG PO TB24
150.0000 mg | ORAL_TABLET | Freq: Every day | ORAL | Status: DC
  Administered 2021-02-15 – 2021-02-17 (×3): 150 mg via ORAL
  Filled 2021-02-14 (×3): qty 1

## 2021-02-14 MED ORDER — IOHEXOL 350 MG/ML SOLN
50.0000 mL | Freq: Once | INTRAVENOUS | Status: AC | PRN
  Administered 2021-02-14: 08:00:00 50 mL via INTRAVENOUS

## 2021-02-14 MED ORDER — POTASSIUM CHLORIDE 10 MEQ/100ML IV SOLN
10.0000 meq | INTRAVENOUS | Status: AC
  Administered 2021-02-14 (×3): 10 meq via INTRAVENOUS
  Filled 2021-02-14 (×3): qty 100

## 2021-02-14 MED ORDER — SENNOSIDES-DOCUSATE SODIUM 8.6-50 MG PO TABS: 1.0000 | ORAL_TABLET | Freq: Every evening | ORAL | Status: DC | PRN

## 2021-02-14 MED ORDER — POTASSIUM CHLORIDE 10 MEQ/100ML IV SOLN: 10.0000 meq | INTRAVENOUS | Status: DC

## 2021-02-14 MED ORDER — BUPROPION HCL ER (XL) 150 MG PO TB24
150.0000 mg | ORAL_TABLET | Freq: Every day | ORAL | Status: DC
  Filled 2021-02-14: qty 1

## 2021-02-14 MED ORDER — SODIUM CHLORIDE 0.9 % IV SOLN
1.0000 g | INTRAVENOUS | Status: DC
  Administered 2021-02-14 – 2021-02-16 (×3): 1 g via INTRAVENOUS
  Filled 2021-02-14 (×3): qty 10

## 2021-02-14 MED ORDER — MEMANTINE HCL 10 MG PO TABS
10.0000 mg | ORAL_TABLET | Freq: Two times a day (BID) | ORAL | Status: DC
  Administered 2021-02-14 – 2021-02-17 (×7): 10 mg via ORAL
  Filled 2021-02-14 (×9): qty 1

## 2021-02-14 MED ORDER — LACTATED RINGERS IV SOLN: INTRAVENOUS | Status: DC

## 2021-02-14 MED ORDER — IOHEXOL 350 MG/ML SOLN
50.0000 mL | Freq: Once | INTRAVENOUS | Status: AC | PRN
  Administered 2021-02-14: 02:00:00 50 mL via INTRAVENOUS

## 2021-02-14 MED ORDER — AMLODIPINE BESYLATE 5 MG PO TABS
5.0000 mg | ORAL_TABLET | Freq: Every day | ORAL | Status: DC
  Administered 2021-02-14 – 2021-02-17 (×4): 5 mg via ORAL
  Filled 2021-02-14 (×4): qty 1

## 2021-02-14 MED ORDER — ACETAMINOPHEN 325 MG PO TABS: 650.0000 mg | ORAL_TABLET | Freq: Four times a day (QID) | ORAL | Status: DC | PRN

## 2021-02-14 MED ORDER — SODIUM CHLORIDE 0.9 % IV SOLN
2.0000 g | Freq: Once | INTRAVENOUS | Status: AC
  Administered 2021-02-14: 03:00:00 2 g via INTRAVENOUS
  Filled 2021-02-14: qty 20

## 2021-02-14 MED ORDER — HEPARIN SODIUM (PORCINE) 5000 UNIT/ML IJ SOLN
5000.0000 [IU] | Freq: Three times a day (TID) | INTRAMUSCULAR | Status: DC
  Administered 2021-02-14 – 2021-02-17 (×11): 5000 [IU] via SUBCUTANEOUS
  Filled 2021-02-14 (×11): qty 1

## 2021-02-14 MED ORDER — DEXTROSE IN LACTATED RINGERS 5 % IV SOLN: INTRAVENOUS | Status: DC

## 2021-02-14 MED ORDER — LEVOTHYROXINE SODIUM 25 MCG PO TABS
125.0000 ug | ORAL_TABLET | Freq: Every day | ORAL | Status: DC
  Administered 2021-02-14: 06:00:00 125 ug via ORAL
  Filled 2021-02-14: qty 1

## 2021-02-14 MED ORDER — ACETAMINOPHEN 650 MG RE SUPP: 650.0000 mg | Freq: Four times a day (QID) | RECTAL | Status: DC | PRN

## 2021-02-14 NOTE — Progress Notes (Signed)
°  Echocardiogram 2D Echocardiogram has been performed.  [name] F 02/14/2021, 2:20 PM

## 2021-02-14 NOTE — ED Provider Notes (Signed)
[facility] EMERGENCY DEPARTMENT Provider Note   CSN: [id number] Arrival date & time: 02/13/21  2253     History Chief Complaint  Patient presents with   Unresponsive    [name] is a 87 y.o. female.  87 year old female with multi medical problems as documented below but not on any medications that presents emerged from today for unresponsiveness and apnea.  Story was that the patient was in her normal state of health until around 2200 when she suddenly became unresponsive.  Her aide was with her.  EMS was called and the dispatch told them to start CPR which she did.  On EMS arrival they checked her pulse and she did have a pulse but she was having significant respiratory depression so she was bagged.  She has a DNR form and thus she was brought here for further evaluation.  Patient not able to offer any other history.     Past Medical History:  Diagnosis Date   Abnormality of gait    Alzheimer's disease ([place])    Calculus of kidney    Closed fracture of seventh cervical vertebra without mention of spinal cord injury    COVID-19 06/2019   Depressive disorder, not elsewhere classified    Edema    Edema    External hemorrhoids without mention of complication    Gastric ulcer, unspecified as acute or chronic, without mention of hemorrhage, perforation, or obstruction    GERD (gastroesophageal reflux disease)    Hypertension    Insomnia, unspecified    Loss of weight    Osteoarthrosis, unspecified whether generalized or localized, unspecified site    Other protein-calorie malnutrition    Pain in thoracic spine    Personal history of fall    Unspecified constipation    Unspecified hereditary and idiopathic peripheral neuropathy    Unspecified hypothyroidism    Unspecified vitamin D deficiency    Urinary frequency     Patient Active Problem List   Diagnosis Date Noted   Sepsis ([name]) 02/14/2021   UTI (urinary tract infection) 02/14/2021   Respiratory  failure with hypoxia ([place]) 02/14/2021   Prolonged QT interval 02/14/2021   CKD (chronic kidney disease), stage III ([place]) 02/14/2021   Severe episode of recurrent major depressive disorder, without psychotic features ([place]) 61/95/0932   Complicated UTI (urinary tract infection) 06/15/2019   Generalized weakness 06/15/2019   Alzheimer's dementia with behavioral disturbance ([place]) 06/15/2019   COVID-19 virus infection 06/15/2019   BRBPR (bright red blood per rectum) 06/30/2017   Colitis 06/30/2017   Protein-calorie malnutrition, severe 11/13/2016   Multiple falls    Encephalopathy    Agitation    Slurred speech 11/10/2016   Vitamin D deficiency 03/07/2015   Hx of fracture of vertebral column 12/28/2014   Constipation 07/27/2013   Fracture of vertebra 07/27/2013   Loss of weight 07/13/2013   Peripheral vascular disease, unspecified ([place]) 07/13/2013   Epigastric mass 07/13/2013   Dementia with behavioral disturbance    Major depression ([place])    Insomnia, unspecified    Fall 09/07/2012   Fracture of right distal radius 09/07/2012   Hypothyroidism 08/18/2006   HYPERCHOLESTEROLEMIA 08/18/2006   Essential hypertension 08/18/2006   Restless legs 08/18/2006    Past Surgical History:  Procedure Laterality Date   TEE WITHOUT CARDIOVERSION N/A 11/13/2016   Procedure: TRANSESOPHAGEAL ECHOCARDIOGRAM (TEE);  Surgeon: Jerline Pain, MD;  Location: [facility] ENDOSCOPY;  Service: Cardiovascular;  Laterality: N/A;   WRIST SURGERY Right 2013  OB History   No obstetric history on file.     Family History  Problem Relation Age of Onset   Cancer Mother    CVA Father    Cancer Brother    Alcohol abuse Sister     Social History   Tobacco Use   Smoking status: Never   Smokeless tobacco: Never  Vaping Use   Vaping Use: Never used  Substance Use Topics   Alcohol use: No   Drug use: No    Home Medications Prior to Admission medications   Medication Sig Start Date End Date Taking?  Authorizing Provider  acetaminophen (TYLENOL) 325 MG tablet Take 2 tablets (650 mg total) by mouth every 6 (six) hours as needed. 07/24/17  Yes Jacqlyn Larsen, PA-C  amLODipine (NORVASC) 5 MG tablet TAKE 1 TABLET(5 MG) BY MOUTH DAILY Patient taking differently: Take 5 mg by mouth daily. 10/03/20  Yes [name], NP  buPROPion (WELLBUTRIN XL) 150 MG 24 hr tablet TAKE 1 TABLET BY MOUTH EVERY DAY Patient taking differently: Take 150 mg by mouth daily. TAKE 1 TABLET BY MOUTH EVERY DAY 10/02/20  Yes [name], NP  donepezil (ARICEPT) 10 MG tablet Take 1 tablet (10 mg total) by mouth at bedtime. 11/22/20  Yes [name], NP  levothyroxine (SYNTHROID) 75 MCG tablet TAKE 1 TABLET(75 MCG) BY MOUTH DAILY Patient taking differently: Take 75 mcg by mouth daily. 07/02/20  Yes [name], NP  memantine (NAMENDA) 10 MG tablet Take 1 tablet (10 mg total) by mouth 2 (two) times daily. 11/22/20  Yes [name], NP  QUEtiapine (SEROQUEL) 25 MG tablet TAKE 1/2 TO 1 TABLET(12.5 TO 25 MG) BY MOUTH AT BEDTIME AS NEEDED Patient taking differently: Take 25 mg by mouth as needed (sleep). 10/02/20  Yes [name], NP  venlafaxine XR (EFFEXOR-XR) 75 MG 24 hr capsule TAKE 1 CAPSULE BY MOUTH EVERY DAY WITH BREAKFAST Patient taking differently: Take 75 mg by mouth daily with breakfast. TAKE 1 CAPSULE BY MOUTH EVERY DAY WITH BREAKFAST 10/02/20  Yes [name], NP  Vitamin D, Ergocalciferol, (DRISDOL) 1.25 MG (50000 UNIT) CAPS capsule TAKE 1 CAPSULE BY MOUTH EVERY 14 DAYS Patient taking differently: Take 50,000 Units by mouth every 14 (fourteen) days. TAKE 1 CAPSULE BY MOUTH EVERY 14 DAYS 10/02/20  Yes [name], NP    Allergies    Patient has no known allergies.  Review of Systems   Review of Systems  Unable to perform ROS: Patient unresponsive   Physical Exam Updated Vital Signs BP (!) 151/70    Pulse 69    Temp 97.6 F (36.4 C) (Oral)    Resp 13    Ht 5\' 4"   (1.626 m)    Wt 63 kg    SpO2 100%    BMI 23.84 kg/m   Physical Exam Vitals and nursing note reviewed.  Constitutional:      General: She is in acute distress.     Appearance: She is ill-appearing.  HENT:     Head: Normocephalic and atraumatic.     Nose: No congestion.     Mouth/Throat:     Mouth: Mucous membranes are moist.  Eyes:     Pupils: Pupils are equal, round, and reactive to light.  Cardiovascular:     Rate and Rhythm: Normal rate.  Pulmonary:     Effort: Respiratory distress (intermittently apneic and then bradypneic) present.  Abdominal:     General: Abdomen is  flat.  Musculoskeletal:        General: No swelling or tenderness. Normal range of motion.  Skin:    General: Skin is warm and dry.  Neurological:     General: No focal deficit present.    ED Results / Procedures / Treatments   Labs (all labs ordered are listed, but only abnormal results are displayed) Labs Reviewed  COMPREHENSIVE METABOLIC PANEL - Abnormal; Notable for the following components:      Result Value   Potassium 2.9 (*)    Glucose, Bld 162 (*)    Creatinine, Ser 1.43 (*)    Total Protein 6.4 (*)    GFR, Estimated 35 (*)    All other components within normal limits  LACTIC ACID, PLASMA - Abnormal; Notable for the following components:   Lactic Acid, Venous 2.4 (*)    All other components within normal limits  CBC WITH DIFFERENTIAL/PLATELET - Abnormal; Notable for the following components:   WBC 11.8 (*)    MCV 104.6 (*)    MCH 35.0 (*)    All other components within normal limits  URINALYSIS, ROUTINE W REFLEX MICROSCOPIC - Abnormal; Notable for the following components:   Color, Urine AMBER (*)    APPearance CLOUDY (*)    Protein, ur 30 (*)    Nitrite POSITIVE (*)    Leukocytes,Ua SMALL (*)    WBC, UA >50 (*)    Bacteria, UA RARE (*)    All other components within normal limits  TSH - Abnormal; Notable for the following components:   TSH 17.945 (*)    All other components  within normal limits  BASIC METABOLIC PANEL - Abnormal; Notable for the following components:   Glucose, Bld 101 (*)    Creatinine, Ser 1.10 (*)    Calcium 8.1 (*)    GFR, Estimated 49 (*)    All other components within normal limits  CBC - Abnormal; Notable for the following components:   MCV 104.1 (*)    MCH 34.7 (*)    All other components within normal limits  CBG MONITORING, ED - Abnormal; Notable for the following components:   Glucose-Capillary 149 (*)    All other components within normal limits  I-STAT VENOUS BLOOD GAS, ED - Abnormal; Notable for the following components:   pCO2, Ven 37.7 (*)    pO2, Ven 140.0 (*)    Potassium 2.9 (*)    Calcium, Ion 1.05 (*)    All other components within normal limits  RESP PANEL BY RT-PCR (FLU A&B, COVID) ARPGX2  CULTURE, BLOOD (ROUTINE X 2)  CULTURE, BLOOD (ROUTINE X 2)  URINE CULTURE  AMMONIA  RAPID URINE DRUG SCREEN, HOSP PERFORMED  ETHANOL  LACTIC ACID, PLASMA  LACTIC ACID, PLASMA  MAGNESIUM  CBG MONITORING, ED    EKG None  Radiology CT Angio Head W or Wo Contrast  Result Date: 02/14/2021 CLINICAL DATA:  Sudden severe headache EXAM: CT ANGIOGRAPHY HEAD TECHNIQUE: Multidetector CT imaging of the head was performed using the standard protocol during bolus administration of intravenous contrast. Multiplanar CT image reconstructions and MIPs were obtained to evaluate the vascular anatomy. CONTRAST:  64mL OMNIPAQUE IOHEXOL 350 MG/ML SOLN COMPARISON:  Head CT 02/13/2021 FINDINGS: POSTERIOR CIRCULATION: --Vertebral arteries: Right V4 segment is non-opacified. Normal left. --Inferior cerebellar arteries: Normal. --Basilar artery: Normal. --Superior cerebellar arteries: Normal. --Posterior cerebral arteries: Moderate narrowing at the distal right P3 segment. No occlusion or stenosis of the left PCA. ANTERIOR CIRCULATION: --Intracranial internal carotid arteries: Normal. --  Anterior cerebral arteries (ACA): There is moderate stenosis of  the proximal left A2 segment. No other stenosis or occlusion. --Middle cerebral arteries (MCA): Normal. ANATOMIC VARIANTS: None Review of the MIP images confirms the above findings. IMPRESSION: 1. No intracranial arterial occlusion or high-grade stenosis. 2. Occlusion of the right vertebral artery V4 segment. 3. Moderate narrowing of the distal right P3 segment. 4. Moderate stenosis of the proximal left A2 segment. Electronically Signed   By: Ulyses Jarred M.D.   On: 02/14/2021 02:34   CT Head Wo Contrast  Result Date: 02/13/2021 CLINICAL DATA:  Loss of consciousness EXAM: CT HEAD WITHOUT CONTRAST TECHNIQUE: Contiguous axial images were obtained from the base of the skull through the vertex without intravenous contrast. COMPARISON:  None. FINDINGS: Brain: There is no mass, hemorrhage or extra-axial collection. There is generalized atrophy without lobar predilection. Hypodensity of the white matter is most commonly associated with chronic microvascular disease. Small calcified meningioma in the left posterior fossa. Vascular: Atherosclerotic calcification of the internal carotid arteries at the skull base. No abnormal hyperdensity of the major intracranial arteries or dural venous sinuses. Skull: The visualized skull base, calvarium and extracranial soft tissues are normal. Sinuses/Orbits: No fluid levels or advanced mucosal thickening of the visualized paranasal sinuses. No mastoid or middle ear effusion. The orbits are normal. IMPRESSION: Generalized atrophy and chronic microvascular ischemia without acute intracranial abnormality. Electronically Signed   By: Ulyses Jarred M.D.   On: 02/13/2021 23:45   DG Chest Portable 1 View  Result Date: 02/14/2021 CLINICAL DATA:  Unresponsiveness. EXAM: PORTABLE CHEST 1 VIEW COMPARISON:  Chest radiograph dated 02/02/2020 FINDINGS: Diffuse interstitial prominence. No focal consolidation, pleural effusion or pneumothorax. Cardiomegaly. Atherosclerotic calcification of  the aorta. No acute osseous pathology. IMPRESSION: 1. No acute cardiopulmonary process. 2. Cardiomegaly. Electronically Signed   By: Anner Crete M.D.   On: 02/14/2021 02:44    Procedures .Critical Care Performed by: Merrily Pew, MD Authorized by: Merrily Pew, MD   Critical care provider statement:    Critical care time (minutes):  32   Critical care time was exclusive of:  Separately billable procedures and treating other patients and teaching time   Critical care was necessary to treat or prevent imminent or life-threatening deterioration of the following conditions:  CNS failure or compromise   Critical care was time spent personally by me on the following activities:  Development of treatment plan with patient or surrogate, evaluation of patient's response to treatment, examination of patient, obtaining history from patient or surrogate, review of old charts, re-evaluation of patient's condition, pulse oximetry, ordering and performing treatments and interventions and ordering and review of laboratory studies   Medications Ordered in ED Medications  amLODipine (NORVASC) tablet 5 mg (has no administration in time range)  memantine (NAMENDA) tablet 10 mg (has no administration in time range)  buPROPion (WELLBUTRIN XL) 24 hr tablet 150 mg (has no administration in time range)  levothyroxine (SYNTHROID) tablet 125 mcg (125 mcg Oral Given 02/14/21 0612)  heparin injection 5,000 Units (5,000 Units Subcutaneous Given 02/14/21 0612)  lactated ringers infusion ( Intravenous New Bag/Given 02/14/21 0421)  acetaminophen (TYLENOL) tablet 650 mg (has no administration in time range)    Or  acetaminophen (TYLENOL) suppository 650 mg (has no administration in time range)  senna-docusate (Senokot-S) tablet 1 tablet (has no administration in time range)  cefTRIAXone (ROCEPHIN) 1 g in sodium chloride 0.9 % 100 mL IVPB (1 g Intravenous Not Given 02/14/21 0357)  lactated ringers bolus 1,000 mL (  0  mLs Intravenous Stopped 02/14/21 0228)  potassium chloride 10 mEq in 100 mL IVPB (0 mEq Intravenous Stopped 02/14/21 0446)  magnesium sulfate IVPB 2 g 50 mL (0 g Intravenous Stopped 02/14/21 0126)  iohexol (OMNIPAQUE) 350 MG/ML injection 50 mL (50 mLs Intravenous Contrast Given 02/14/21 0215)  cefTRIAXone (ROCEPHIN) 2 g in sodium chloride 0.9 % 100 mL IVPB (0 g Intravenous Stopped 02/14/21 0324)    ED Course  I have reviewed the triage vital signs and the nursing notes.  Pertinent labs & imaging results that were available during my care of the patient were reviewed by me and considered in my medical decision making (see chart for details).    MDM Rules/Calculators/A&P                         On patient arrival she was unresponsive to verbal stimuli.  She was initially with shallow respirations.  Eventually patient did become apneic and became hypoxic during that time as well.  She required bag-valve-mask.  I called her daughter and verified her DNR status.  Her daughter asked for [place] to continue bagging her until [place] come with her for her to pass.  Over the next 5 to 10 minutes the patient's breathing improved and she started having spontaneous respirations.  Subsequently a noncontrasted head CT was done to evaluate for any evidence of stroke, bleed or other easily reversible causes and this was negative.  Patient was able to start squeezing fingers and open her eyes spontaneously and coughing but still not following commands and not anywhere near be unresponsive.  T rectal temperature was low so TSH was added which was elevated consistent with mild hypothyroidism.  Urine does appear to be infected so antibiotics started after cultures were drawn.  Her potassium was low so potassium and magnesium were given by do not think this is related to her symptoms at this time.  CTA was performed to evaluate for any large vessel occlusion and this was negative for any acute findings.  I discussed with  neurology the possibility of stroke versus seizure and they thought that was unlikely.  I discussed with medicine for admission.    Final Clinical Impression(s) / ED Diagnoses Final diagnoses:  Altered mental status, unspecified altered mental status type  Acute cystitis with hematuria    Rx / DC Orders ED Discharge Orders     None        Tilley Faeth, Corene Cornea, MD 02/14/21 [phone]

## 2021-02-14 NOTE — Progress Notes (Signed)
RN, Agricultural consultant, NT at bedside. HR 78 BP152/78 Patient arrived from Emergency department to unit and appears calm and unresponsive. Patient assessed. Patient responds to pain at this time; patient response to voice after several attempts. Patient gown changed and bed linen changed. Telemonitoring connection attached. Will continue to monitor

## 2021-02-14 NOTE — H&P (Signed)
History and Physical    [name] IRC:[id number] DOB: May 15, 1931 DOA: 02/13/2021  PCP: [name], NP   Patient coming from: Home  Chief Complaint: Respiratory distress  HPI: [name] is a 87 y.o. female with medical history significant for Alzheimer's dementia, hypertension, hypothyroidism presents by EMS with respiratory failure decreased responsiveness.  Daughter is at bedside.  Reportedly patient was in her normal state of health and was watching a movie before going to bed with her caregiver when she suddenly became unresponsive around 10 PM last night.  She has home health aides 24 hours a day and daughter has cameras and can check on her mother frequently.  Reportedly the aide stated that she had slumped over which the daughter confirmed by watching the video from the in-house camera and 911 was called.  The aide started CPR per 911's instructions even though patient had a DNR form but the aide did not mention that to 911 reportedly.  When EMS arrived patient had significant respiratory depression and was started on bag-valve-mask to aid respiration.  After arriving in the emergency room patient was noted to be apneic with no spontaneous respirations and the family requested the ER doctor continue bag valve mask therapy until family could get there in 5 or 10 minutes.  When family arrived patient spontaneously started breathing on her own and over the next few hours slowly had periods where she would move her extremities or open her eyes and occasionally answer question with single word answer.  She has not had any recent illness or fever and there was no complaint of any seizure-like activity, chest pain, vomiting, diarrhea or cough.  Respiratory depression occurred when patient was watching a movie was not eating and had no episodes of choking or gagging. Lives at home with home health aides and family checks on her daily   ED Course: In the emergency room patient had  witnessed periods of apnea by the ER physician intervention with bag-valve-mask was undertaken and continues her family could arrive so patient would not die alone per the family's request.  By time family arrived patient was improving and was breathing on her own with did not require bagging any longer.  CT showed no acute intracranial process.  Patient has some chronic stenosis and occlusion on CT angiography of her head but nothing that would explain respiratory depression.  Lab work revealed potassium of 2.9 creatinine of 1.43 which is her baseline.  Remainder of electrolytes and LFTs normal.  Lactic acid was elevated at 2.4.  WBC 11,800 hemoglobin 14.5 hematocrit 43.5 platelets 204,000.  Alcohol level negative.  TSH 17.945.  Urinalysis is cloudy amber color with positive nitrites small leukocytes.  Patient is already on antibiotic coverage with Rocephin.  Cultures were obtained.  Hospitalist service was asked to admit for further management  Review of Systems:  Cannot obtain review of systems secondary to dementia  Past Medical History:  Diagnosis Date   Abnormality of gait    Alzheimer's disease ([place])    Calculus of kidney    Closed fracture of seventh cervical vertebra without mention of spinal cord injury    COVID-19 06/2019   Depressive disorder, not elsewhere classified    Edema    Edema    External hemorrhoids without mention of complication    Gastric ulcer, unspecified as acute or chronic, without mention of hemorrhage, perforation, or obstruction    GERD (gastroesophageal reflux disease)    Hypertension    Insomnia,  unspecified    Loss of weight    Osteoarthrosis, unspecified whether generalized or localized, unspecified site    Other protein-calorie malnutrition    Pain in thoracic spine    Personal history of fall    Unspecified constipation    Unspecified hereditary and idiopathic peripheral neuropathy    Unspecified hypothyroidism    Unspecified vitamin D deficiency     Urinary frequency     Past Surgical History:  Procedure Laterality Date   TEE WITHOUT CARDIOVERSION N/A 11/13/2016   Procedure: TRANSESOPHAGEAL ECHOCARDIOGRAM (TEE);  Surgeon: Jerline Pain, MD;  Location: [facility] ENDOSCOPY;  Service: Cardiovascular;  Laterality: N/A;   WRIST SURGERY Right 2013    Social History  reports that she has never smoked. She has never used smokeless tobacco. She reports that she does not drink alcohol and does not use drugs.  No Known Allergies  Family History  Problem Relation Age of Onset   Cancer Mother    CVA Father    Cancer Brother    Alcohol abuse Sister      Prior to Admission medications   Medication Sig Start Date End Date Taking? Authorizing Provider  acetaminophen (TYLENOL) 325 MG tablet Take 2 tablets (650 mg total) by mouth every 6 (six) hours as needed. 07/24/17  Yes Jacqlyn Larsen, PA-C  amLODipine (NORVASC) 5 MG tablet TAKE 1 TABLET(5 MG) BY MOUTH DAILY Patient taking differently: Take 5 mg by mouth daily. 10/03/20  Yes [name], NP  buPROPion (WELLBUTRIN XL) 150 MG 24 hr tablet TAKE 1 TABLET BY MOUTH EVERY DAY Patient taking differently: Take 150 mg by mouth daily. TAKE 1 TABLET BY MOUTH EVERY DAY 10/02/20  Yes [name], NP  donepezil (ARICEPT) 10 MG tablet Take 1 tablet (10 mg total) by mouth at bedtime. 11/22/20  Yes [name], NP  levothyroxine (SYNTHROID) 75 MCG tablet TAKE 1 TABLET(75 MCG) BY MOUTH DAILY Patient taking differently: Take 75 mcg by mouth daily. 07/02/20  Yes [name], NP  memantine (NAMENDA) 10 MG tablet Take 1 tablet (10 mg total) by mouth 2 (two) times daily. 11/22/20  Yes [name], NP  QUEtiapine (SEROQUEL) 25 MG tablet TAKE 1/2 TO 1 TABLET(12.5 TO 25 MG) BY MOUTH AT BEDTIME AS NEEDED Patient taking differently: Take 25 mg by mouth as needed (sleep). 10/02/20  Yes [name], NP  venlafaxine XR (EFFEXOR-XR) 75 MG 24 hr capsule TAKE 1 CAPSULE BY MOUTH EVERY DAY WITH  BREAKFAST Patient taking differently: Take 75 mg by mouth daily with breakfast. TAKE 1 CAPSULE BY MOUTH EVERY DAY WITH BREAKFAST 10/02/20  Yes [name], NP  Vitamin D, Ergocalciferol, (DRISDOL) 1.25 MG (50000 UNIT) CAPS capsule TAKE 1 CAPSULE BY MOUTH EVERY 14 DAYS Patient taking differently: Take 50,000 Units by mouth every 14 (fourteen) days. TAKE 1 CAPSULE BY MOUTH EVERY 14 DAYS 10/02/20  Yes [name], NP    Physical Exam: Vitals:   02/14/21 0145 02/14/21 0230 02/14/21 0233 02/14/21 0245  BP: 116/78 (!) 167/72  140/68  Pulse: 65 69  70  Resp: 18 17  15   Temp:   (!) 97.4 F (36.3 C)   TempSrc:   Oral   SpO2: 100% 100%  100%  Weight:      Height:        Constitutional: NAD, calm, comfortable Vitals:   02/14/21 0145 02/14/21 0230 02/14/21 0233 02/14/21 0245  BP: 116/78 (!) 167/72  140/68  Pulse: 65 69  70  Resp: 18 17  15   Temp:   (!) 97.4 F (36.3 C)   TempSrc:   Oral   SpO2: 100% 100%  100%  Weight:      Height:       General: WDWN, lethargic.  Briefly opens eyes Eyes: PERRL, conjunctivae normal.  Sclera nonicteric HENT:  [place]/AT, external ears normal.  Nares patent without epistasis.  Mucous membranes are dry Neck: Soft, normal range of motion, supple, no masses, no thyromegaly.  Trachea midline Respiratory: clear to auscultation bilaterally, no wheezing, no crackles. Normal respiratory effort. No accessory muscle use.  Cardiovascular: Regular rate and rhythm, no murmurs / rubs / gallops. No extremity edema. 2+ pedal pulses. Abdomen: Soft, no tenderness, nondistended, no rebound or guarding. Bowel sounds normoactive Musculoskeletal: Moves extremities spontaneously.  Withdraws extremities from painful stimuli.. no cyanosis. Normal muscle tone.  Skin: Warm, dry, intact no rashes, lesions, ulcers. No induration Neurologic: Normal speech. patella DTR +1 bilaterally.  No tremor. Psychiatric: Somnolent.  Briefly wakes up to loud voice and tactile  stimulation.   Labs on Admission: I have personally reviewed following labs and imaging studies  CBC: Recent Labs  Lab 02/13/21 2305 02/13/21 2334  WBC 11.8*  --   NEUTROABS 7.4  --   HGB 14.5 14.3  HCT 43.3 42.0  MCV 104.6*  --   PLT 204  --     Basic Metabolic Panel: Recent Labs  Lab 02/13/21 2305 02/13/21 2334  NA 139 142  K 2.9* 2.9*  CL 106  --   CO2 22  --   GLUCOSE 162*  --   BUN 19  --   CREATININE 1.43*  --   CALCIUM 8.9  --     GFR: Estimated Creatinine Clearance: 23.9 mL/min (A) (by C-G formula based on SCr of 1.43 mg/dL (H)).  Liver Function Tests: Recent Labs  Lab 02/13/21 2305  AST 17  ALT 12  ALKPHOS 75  BILITOT 0.8  PROT 6.4*  ALBUMIN 3.8    Urine analysis:    Component Value Date/Time   COLORURINE AMBER (A) 02/13/2021 0022   APPEARANCEUR CLOUDY (A) 02/13/2021 0022   LABSPEC 1.016 02/13/2021 0022   PHURINE 5.0 02/13/2021 0022   GLUCOSEU NEGATIVE 02/13/2021 0022   HGBUR NEGATIVE 02/13/2021 0022   BILIRUBINUR NEGATIVE 02/13/2021 0022   BILIRUBINUR neg 05/26/2019 1646   KETONESUR NEGATIVE 02/13/2021 0022   PROTEINUR 30 (A) 02/13/2021 0022   UROBILINOGEN 0.2 05/26/2019 1646   UROBILINOGEN 0.2 12/19/2014 2002   NITRITE POSITIVE (A) 02/13/2021 0022   LEUKOCYTESUR SMALL (A) 02/13/2021 0022    Radiological Exams on Admission: CT Angio Head W or Wo Contrast  Result Date: 02/14/2021 CLINICAL DATA:  Sudden severe headache EXAM: CT ANGIOGRAPHY HEAD TECHNIQUE: Multidetector CT imaging of the head was performed using the standard protocol during bolus administration of intravenous contrast. Multiplanar CT image reconstructions and MIPs were obtained to evaluate the vascular anatomy. CONTRAST:  62mL OMNIPAQUE IOHEXOL 350 MG/ML SOLN COMPARISON:  Head CT 02/13/2021 FINDINGS: POSTERIOR CIRCULATION: --Vertebral arteries: Right V4 segment is non-opacified. Normal left. --Inferior cerebellar arteries: Normal. --Basilar artery: Normal. --Superior  cerebellar arteries: Normal. --Posterior cerebral arteries: Moderate narrowing at the distal right P3 segment. No occlusion or stenosis of the left PCA. ANTERIOR CIRCULATION: --Intracranial internal carotid arteries: Normal. --Anterior cerebral arteries (ACA): There is moderate stenosis of the proximal left A2 segment. No other stenosis or occlusion. --Middle cerebral arteries (MCA): Normal. ANATOMIC VARIANTS: None Review of the MIP images confirms  the above findings. IMPRESSION: 1. No intracranial arterial occlusion or high-grade stenosis. 2. Occlusion of the right vertebral artery V4 segment. 3. Moderate narrowing of the distal right P3 segment. 4. Moderate stenosis of the proximal left A2 segment. Electronically Signed   By: Ulyses Jarred M.D.   On: 02/14/2021 02:34   CT Head Wo Contrast  Result Date: 02/13/2021 CLINICAL DATA:  Loss of consciousness EXAM: CT HEAD WITHOUT CONTRAST TECHNIQUE: Contiguous axial images were obtained from the base of the skull through the vertex without intravenous contrast. COMPARISON:  None. FINDINGS: Brain: There is no mass, hemorrhage or extra-axial collection. There is generalized atrophy without lobar predilection. Hypodensity of the white matter is most commonly associated with chronic microvascular disease. Small calcified meningioma in the left posterior fossa. Vascular: Atherosclerotic calcification of the internal carotid arteries at the skull base. No abnormal hyperdensity of the major intracranial arteries or dural venous sinuses. Skull: The visualized skull base, calvarium and extracranial soft tissues are normal. Sinuses/Orbits: No fluid levels or advanced mucosal thickening of the visualized paranasal sinuses. No mastoid or middle ear effusion. The orbits are normal. IMPRESSION: Generalized atrophy and chronic microvascular ischemia without acute intracranial abnormality. Electronically Signed   By: Ulyses Jarred M.D.   On: 02/13/2021 23:45   DG Chest Portable 1  View  Result Date: 02/14/2021 CLINICAL DATA:  Unresponsiveness. EXAM: PORTABLE CHEST 1 VIEW COMPARISON:  Chest radiograph dated 02/02/2020 FINDINGS: Diffuse interstitial prominence. No focal consolidation, pleural effusion or pneumothorax. Cardiomegaly. Atherosclerotic calcification of the aorta. No acute osseous pathology. IMPRESSION: 1. No acute cardiopulmonary process. 2. Cardiomegaly. Electronically Signed   By: Anner Crete M.D.   On: 02/14/2021 02:44    EKG: Independently reviewed.  EKG shows normal sinus rhythm with no acute ST elevation or depression.  QTc 519  Assessment/Plan Principal Problem:   Sepsis  [name] is admitted to Medical Telemetry floor.  Has sepsis by criteria of acute respiratory failure in setting of UTI with hypothermia and elevated WBC. Is on Bair hugger and on oxygen by n/c at this time. Had period of apnea earlier.  Active Problems:   Respiratory failure with hypoxia Earlier pt had periods of apnea and required bag valve mask. Is now breathing on own.  If stops breathing will not intubate.     UTI (urinary tract infection) Treated with Rocephin. IVF hydration. Check CBC, BMP in am     Hypokalemia Check magnesium level. Supplement potassium. Monitor electrolytes    Hypothyroidism TSH is elevated to over 17. Increase synthroid to 125 mcg a day from current 75 mcg. Will need TSH rechecked in 2 months.     Essential hypertension Continue Norvasc. Monitor BP    CKD (chronic kidney disease), stage III B Stable    Prolonged QT interval Avoid medications which could further prolong QT interval    Dementia with behavioral disturbance Continue Namenda.  Hold Aricept with QT prolongation  DVT prophylaxis: Heparin for DVT prophylaxis.  Code Status:   DNR. Confirmed with daughter who is at bedside. DNR paperwork at bedside  Family Communication:  Diagnosis and plan discussed with patient's daughter who is at bedside.  Questions answered.  She verbalized  understanding agrees with plan.  Further recommendations to follow as clinically indicated Disposition Plan:   Patient is from:  Home  Anticipated DC to:  To be determined  Anticipated DC date:  Anticipate 2 midnight stay  Admission status:  Inpatient  Yevonne Aline Sven Pinheiro MD [facility]  How to contact  the [facility] Attending or Consulting provider [place] or covering provider during after hours [place], for this patient?   Check the care team in [facility] and look for a) attending/consulting [facility] provider listed and b) the [facility] team listed Log into [email or web address] and use [place]'s universal password to access. If you do not have the password, please contact the hospital operator. Locate the [facility] provider you are looking for under [facility] and page to a number that you can be directly reached. If you still have difficulty reaching the provider, please page the [facility] (Director on Call) for the Hospitalists listed on amion for assistance.  02/14/2021, 3:43 AM

## 2021-02-14 NOTE — ED Notes (Addendum)
Family member [name] updated with EEG and MRI results and bed status.

## 2021-02-14 NOTE — Procedures (Signed)
Patient Name: SHARON RUBIS  MRN: [id number]  Epilepsy Attending: Lora Havens  Referring Physician/Provider: Dr Niel Hummer Date: 02/14/2021 Duration: 21.35 mins  Patient history: 87 year old female with altered mental status.  EEG to evaluate for seizure.  Level of alertness: Awake  AEDs during EEG study: None  Technical aspects: This EEG study was done with scalp electrodes positioned according to the 10-20 International system of electrode placement. Electrical activity was acquired at a sampling rate of 500Hz  and reviewed with a high frequency filter of 70Hz  and a low frequency filter of 1Hz . EEG data were recorded continuously and digitally stored.   Description: The posterior dominant rhythm consists of 9 Hz activity of moderate voltage (25-35 uV) seen predominantly in posterior head regions, symmetric and reactive to eye opening and eye closing. EEG showed intermittent generalized polymorphic sharply contoured 5 to 6 Hz theta slowing with overriding 15 to 18 Hz generalized beta activity. Hyperventilation and photic stimulation were not performed.     ABNORMALITY - Intermittent slow, generalized  IMPRESSION: This study is suggestive of mild diffuse encephalopathy, nonspecific etiology. No seizures or epileptiform discharges were seen throughout the recording.  Maire Govan Barbra Sarks

## 2021-02-14 NOTE — ED Notes (Signed)
Gone to MRI at this time

## 2021-02-14 NOTE — ED Notes (Signed)
Bair hugger applied.

## 2021-02-14 NOTE — Plan of Care (Signed)

## 2021-02-14 NOTE — ED Notes (Signed)
Called again for purple man

## 2021-02-14 NOTE — Progress Notes (Signed)
EEG complete - results pending 

## 2021-02-14 NOTE — ED Notes (Signed)
Bair hugger removed  

## 2021-02-14 NOTE — Progress Notes (Signed)
PROGRESS NOTE    [name]  YIR:[id number] DOB: 02-01-1932 DOA: 02/13/2021 PCP: [name], NP   Brief Narrative: 87 year old with past medical history significant for Alzheimer's dementia, hypertension, hypothyroidism who presents via EMS with respiratory failure, decreased responsiveness and respiratory distress.  Patient was in her normal state of health when she suddenly became unresponsive the night of admission.  The aide started CPR.  911 instruction even though patient had a DNR form but the aide did not mention that to 911.  When EMS arrived patient had significant respiratory depression and was a started on a bag-valve-mask to aid respiration.  After arriving in the emergency room patient was noted to be apneic with no spontaneous respiration and the family requested the ER doctor to continue bag-valve-mask therapy until family could get there in 5 or 10 minutes.  When family arrived patient  spontaneously started breathing on her own and over the next few hours slowly had periods where she will will move her extremity and upper her eyes and occasionally answer questions with single word answers.   Assessment & Plan:   Principal Problem:   Sepsis ([place]) Active Problems:   Hypothyroidism   Essential hypertension   Dementia with behavioral disturbance   UTI (urinary tract infection)   Respiratory failure with hypoxia (HCC)   Prolonged QT interval   CKD (chronic kidney disease), stage III (HCC)   1-Sepsis: secondary to UTI.  Patient presents with hypothermia,  respiratory failure, UA consistent with UTI. She was placed on  air Hugger.  Continue with IV ceftriaxone and IV fluids  2-Respiratory arrest: Unclear etiology, suspect related to hypothermia and sepsis. MRI negative for stroke, CTA chest negative for PE. EEG negative for seizures Check 2D echo. Discussed with family, patient continued to be DNR. Palliative care consult   3-Hypokalemia: Replete  IV  4-Hypothyroidism: TSH elevated initially at 17.  Repeated TSH with now 3.4 Continue with current home meds dose.  HTN; on Norvasc.   CKD stage IIIB;  Monitor. Continue with IV fluids.   Prolong QT. Replete electrolytes.  Dementia; Continue with Namenda. Hold Aricept due to prolong QT  Estimated body mass index is 23.84 kg/m as calculated from the following:   Height as of this encounter: 5\' 4"  (1.626 m).   Weight as of this encounter: 63 kg.   DVT prophylaxis: Heparin  Code Status: DNR Family Communication: Daughter at bedside.  Disposition Plan:  Status is: Inpatient  Remains inpatient appropriate because: AMS        Consultants:  Palliative  Procedures:  ECHO  Antimicrobials:    Subjective: She is sleepy.  Daughter at bedside report patient gets this episode of been lethargic, on and off and then she gets more alert, but never with respiratory arrest.     Objective: Vitals:   02/14/21 0432 02/14/21 0445 02/14/21 0500 02/14/21 0615  BP:  136/78 (!) 151/70 (!) 164/76  Pulse:  68 69 77  Resp:  15 13 15   Temp: 97.6 F (36.4 C)     TempSrc: Oral     SpO2:  100% 100% 100%  Weight:      Height:        Intake/Output Summary (Last 24 hours) at 02/14/2021 0717 Last data filed at 02/14/2021 0335 Gross per 24 hour  Intake 1250 ml  Output --  Net 1250 ml   Filed Weights   02/13/21 2316  Weight: 63 kg    Examination:  General exam: lethargic Respiratory system:  Clear to auscultation. Respiratory effort normal. Cardiovascular system: S1 & S2 heard, RRR.  Gastrointestinal system: Abdomen is nondistended, soft and nontender. No organomegaly or masses felt. Normal bowel sounds heard. Central nervous system: sleepy  Extremities: no edema     Data Reviewed: I have personally reviewed following labs and imaging studies  CBC: Recent Labs  Lab 02/13/21 2305 02/13/21 2334 02/14/21 0446  WBC 11.8*  --  9.4  NEUTROABS 7.4  --   --   HGB 14.5  14.3 13.6  HCT 43.3 42.0 40.8  MCV 104.6*  --  104.1*  PLT 204  --  009   Basic Metabolic Panel: Recent Labs  Lab 02/13/21 2305 02/13/21 2334 02/14/21 0446 02/14/21 0616  NA 139 142 135  --   K 2.9* 2.9* 3.6  --   CL 106  --  103  --   CO2 22  --  24  --   GLUCOSE 162*  --  101*  --   BUN 19  --  15  --   CREATININE 1.43*  --  1.10*  --   CALCIUM 8.9  --  8.1*  --   MG  --   --   --  2.3   GFR: Estimated Creatinine Clearance: 31.1 mL/min (A) (by C-G formula based on SCr of 1.1 mg/dL (H)). Liver Function Tests: Recent Labs  Lab 02/13/21 2305  AST 17  ALT 12  ALKPHOS 75  BILITOT 0.8  PROT 6.4*  ALBUMIN 3.8   No results for input(s): LIPASE, AMYLASE in the last 168 hours. Recent Labs  Lab 02/13/21 2305  AMMONIA 21   Coagulation Profile: No results for input(s): INR, PROTIME in the last 168 hours. Cardiac Enzymes: No results for input(s): CKTOTAL, CKMB, CKMBINDEX, TROPONINI in the last 168 hours. BNP (last 3 results) No results for input(s): PROBNP in the last 8760 hours. HbA1C: No results for input(s): HGBA1C in the last 72 hours. CBG: Recent Labs  Lab 02/13/21 2303  GLUCAP 149*   Lipid Profile: No results for input(s): CHOL, HDL, LDLCALC, TRIG, CHOLHDL, LDLDIRECT in the last 72 hours. Thyroid Function Tests: Recent Labs    02/14/21 0044  TSH 17.945*   Anemia Panel: No results for input(s): VITAMINB12, FOLATE, FERRITIN, TIBC, IRON, RETICCTPCT in the last 72 hours. Sepsis Labs: Recent Labs  Lab 02/13/21 2305 02/14/21 0445 02/14/21 0616  LATICACIDVEN 2.4* 1.5 1.4    Recent Results (from the past 240 hour(s))  Resp Panel by RT-PCR (Flu A&B, Covid) Nasopharyngeal Swab     Status: None   Collection Time: 02/14/21  3:16 AM   Specimen: Nasopharyngeal Swab; Nasopharyngeal(NP) swabs in vial transport medium  Result Value Ref Range Status   SARS Coronavirus 2 by RT PCR NEGATIVE NEGATIVE Final    Comment: (NOTE) SARS-CoV-2 target nucleic acids are  NOT DETECTED.  The SARS-CoV-2 RNA is generally detectable in upper respiratory specimens during the acute phase of infection. The lowest concentration of SARS-CoV-2 viral copies this assay can detect is 138 copies/mL. A negative result does not preclude SARS-Cov-2 infection and should not be used as the sole basis for treatment or other patient management decisions. A negative result may occur with  improper specimen collection/handling, submission of specimen other than nasopharyngeal swab, presence of viral mutation(s) within the areas targeted by this assay, and inadequate number of viral copies(<138 copies/mL). A negative result must be combined with clinical observations, patient history, and epidemiological information. The expected result is Negative.  Fact Sheet  for Patients:  EntrepreneurPulse.com.au  Fact Sheet for Healthcare Providers:  IncredibleEmployment.be  This test is no t yet approved or cleared by the [place] FDA and  has been authorized for detection and/or diagnosis of SARS-CoV-2 by FDA under an Emergency Use Authorization (EUA). This EUA will remain  in effect (meaning this test can be used) for the duration of the COVID-19 declaration under Section 564(b)(1) of the Act, 21 U.S.C.section 360bbb-3(b)(1), unless the authorization is terminated  or revoked sooner.       Influenza A by PCR NEGATIVE NEGATIVE Final   Influenza B by PCR NEGATIVE NEGATIVE Final    Comment: (NOTE) The Xpert Xpress SARS-CoV-2/FLU/RSV plus assay is intended as an aid in the diagnosis of influenza from Nasopharyngeal swab specimens and should not be used as a sole basis for treatment. Nasal washings and aspirates are unacceptable for Xpert Xpress SARS-CoV-2/FLU/RSV testing.  Fact Sheet for Patients: EntrepreneurPulse.com.au  Fact Sheet for Healthcare Providers: IncredibleEmployment.be  This test is not  yet approved or cleared by the [place] FDA and has been authorized for detection and/or diagnosis of SARS-CoV-2 by FDA under an Emergency Use Authorization (EUA). This EUA will remain in effect (meaning this test can be used) for the duration of the COVID-19 declaration under Section 564(b)(1) of the Act, 21 U.S.C. section 360bbb-3(b)(1), unless the authorization is terminated or revoked.  Performed at [facility], [place] [address], [place], [place] [address]          Radiology Studies: CT Angio Head W or Wo Contrast  Result Date: 02/14/2021 CLINICAL DATA:  Sudden severe headache EXAM: CT ANGIOGRAPHY HEAD TECHNIQUE: Multidetector CT imaging of the head was performed using the standard protocol during bolus administration of intravenous contrast. Multiplanar CT image reconstructions and MIPs were obtained to evaluate the vascular anatomy. CONTRAST:  36mL OMNIPAQUE IOHEXOL 350 MG/ML SOLN COMPARISON:  Head CT 02/13/2021 FINDINGS: POSTERIOR CIRCULATION: --Vertebral arteries: Right V4 segment is non-opacified. Normal left. --Inferior cerebellar arteries: Normal. --Basilar artery: Normal. --Superior cerebellar arteries: Normal. --Posterior cerebral arteries: Moderate narrowing at the distal right P3 segment. No occlusion or stenosis of the left PCA. ANTERIOR CIRCULATION: --Intracranial internal carotid arteries: Normal. --Anterior cerebral arteries (ACA): There is moderate stenosis of the proximal left A2 segment. No other stenosis or occlusion. --Middle cerebral arteries (MCA): Normal. ANATOMIC VARIANTS: None Review of the MIP images confirms the above findings. IMPRESSION: 1. No intracranial arterial occlusion or high-grade stenosis. 2. Occlusion of the right vertebral artery V4 segment. 3. Moderate narrowing of the distal right P3 segment. 4. Moderate stenosis of the proximal left A2 segment. Electronically Signed   By: Ulyses Jarred M.D.   On: 02/14/2021 02:34   CT Head Wo  Contrast  Result Date: 02/13/2021 CLINICAL DATA:  Loss of consciousness EXAM: CT HEAD WITHOUT CONTRAST TECHNIQUE: Contiguous axial images were obtained from the base of the skull through the vertex without intravenous contrast. COMPARISON:  None. FINDINGS: Brain: There is no mass, hemorrhage or extra-axial collection. There is generalized atrophy without lobar predilection. Hypodensity of the white matter is most commonly associated with chronic microvascular disease. Small calcified meningioma in the left posterior fossa. Vascular: Atherosclerotic calcification of the internal carotid arteries at the skull base. No abnormal hyperdensity of the major intracranial arteries or dural venous sinuses. Skull: The visualized skull base, calvarium and extracranial soft tissues are normal. Sinuses/Orbits: No fluid levels or advanced mucosal thickening of the visualized paranasal sinuses. No mastoid or middle ear effusion. The orbits are  normal. IMPRESSION: Generalized atrophy and chronic microvascular ischemia without acute intracranial abnormality. Electronically Signed   By: Ulyses Jarred M.D.   On: 02/13/2021 23:45   DG Chest Portable 1 View  Result Date: 02/14/2021 CLINICAL DATA:  Unresponsiveness. EXAM: PORTABLE CHEST 1 VIEW COMPARISON:  Chest radiograph dated 02/02/2020 FINDINGS: Diffuse interstitial prominence. No focal consolidation, pleural effusion or pneumothorax. Cardiomegaly. Atherosclerotic calcification of the aorta. No acute osseous pathology. IMPRESSION: 1. No acute cardiopulmonary process. 2. Cardiomegaly. Electronically Signed   By: Anner Crete M.D.   On: 02/14/2021 02:44        Scheduled Meds:  amLODipine  5 mg Oral Daily   buPROPion  150 mg Oral Daily   heparin  5,000 Units Subcutaneous Q8H   levothyroxine  125 mcg Oral Daily   memantine  10 mg Oral BID   Continuous Infusions:  cefTRIAXone (ROCEPHIN)  IV     lactated ringers 75 mL/hr at 02/14/21 0421     LOS: 0 days     Time spent: 35 minutes.    Elmarie Shiley, MD [facility]   If 7PM-7AM, please contact night-coverage [email or web address]  02/14/2021, 7:17 AM

## 2021-02-14 NOTE — ED Notes (Signed)
Nasal trumpet removed and pt transitioned to 2L  per MD

## 2021-02-14 NOTE — ED Notes (Signed)
ED TO INPATIENT HANDOFF REPORT  ED Nurse Name and Phone #: Cindie Laroche [phone]  S Name/Age/Gender [name] 87 y.o. female Room/Bed: 041C/041C  Code Status   Code Status: DNR  Home/SNF/Other Home Patient oriented to: self Is this baseline? No   Triage Complete: Triage complete  Chief Complaint Sepsis [facility]) [A41.9]  Triage Note Pt bib EMS from home for unresponsiveness. Pt was bathing herself, A&Ox4, and she collapsed after this, per EMS. Pt has DNR at the bedside. EMS delivering manual respirations for periods of apnea, no noted significant decreases in oxygen saturations. NPA in L nare by EMS  EMS: CBG 212 HR 70s 112/64   Allergies No Known Allergies  Level of Care/Admitting Diagnosis ED Disposition     ED Disposition  Admit   Condition  --   Comment  Hospital Area: [place] [[id number]]  Level of Care: Telemetry Medical [[id number]]  May admit patient to [facility] or [facility] if equivalent level of care is available:: Yes  Covid Evaluation: Asymptomatic Screening Protocol (No Symptoms)  Diagnosis: Sepsis [facility]) [[id number]]  Admitting Physician: Eben Burow [[id number]]  Attending Physician: Eben Burow [[id number]]  Estimated length of stay: past midnight tomorrow  Certification:: I certify this patient will need inpatient services for at least 2 midnights          B Medical/Surgery History Past Medical History:  Diagnosis Date   Abnormality of gait    Alzheimer's disease ([place])    Calculus of kidney    Closed fracture of seventh cervical vertebra without mention of spinal cord injury    COVID-19 06/2019   Depressive disorder, not elsewhere classified    Edema    Edema    External hemorrhoids without mention of complication    Gastric ulcer, unspecified as acute or chronic, without mention of hemorrhage, perforation, or obstruction    GERD (gastroesophageal reflux disease)    Hypertension    Insomnia, unspecified    Loss  of weight    Osteoarthrosis, unspecified whether generalized or localized, unspecified site    Other protein-calorie malnutrition    Pain in thoracic spine    Personal history of fall    Unspecified constipation    Unspecified hereditary and idiopathic peripheral neuropathy    Unspecified hypothyroidism    Unspecified vitamin D deficiency    Urinary frequency    Past Surgical History:  Procedure Laterality Date   TEE WITHOUT CARDIOVERSION N/A 11/13/2016   Procedure: TRANSESOPHAGEAL ECHOCARDIOGRAM (TEE);  Surgeon: Jerline Pain, MD;  Location: [facility] ENDOSCOPY;  Service: Cardiovascular;  Laterality: N/A;   WRIST SURGERY Right 2013     A IV Location/Drains/Wounds Patient Lines/Drains/Airways Status     Active Line/Drains/Airways     Name Placement date Placement time Site Days   Peripheral IV 02/01/20 Left Antecubital 02/01/20  2306  Antecubital  379   Peripheral IV 02/13/21 18 G Left Antecubital 02/13/21  0000  Antecubital  1   Peripheral IV 02/13/21 20 G Right Antecubital 02/13/21  2300  Antecubital  1   External Urinary Catheter 02/14/21  0200  --  less than 1            Intake/Output Last 24 hours  Intake/Output Summary (Last 24 hours) at 02/14/2021 1503 Last data filed at 02/14/2021 0335 Gross per 24 hour  Intake 1250 ml  Output --  Net 1250 ml    Labs/Imaging Results for orders placed or performed during the hospital encounter of 02/13/21 (from the  past 48 hour(s))  Urine rapid drug screen (hosp performed)     Status: None   Collection Time: 02/13/21 12:22 AM  Result Value Ref Range   Opiates NONE DETECTED NONE DETECTED   Cocaine NONE DETECTED NONE DETECTED   Benzodiazepines NONE DETECTED NONE DETECTED   Amphetamines NONE DETECTED NONE DETECTED   Tetrahydrocannabinol NONE DETECTED NONE DETECTED   Barbiturates NONE DETECTED NONE DETECTED    Comment: (NOTE) DRUG SCREEN FOR MEDICAL PURPOSES ONLY.  IF CONFIRMATION IS NEEDED FOR ANY PURPOSE, NOTIFY LAB WITHIN 5  DAYS.  LOWEST DETECTABLE LIMITS FOR URINE DRUG SCREEN Drug Class                     Cutoff (ng/mL) Amphetamine and metabolites    1000 Barbiturate and metabolites    200 Benzodiazepine                 628 Tricyclics and metabolites     300 Opiates and metabolites        300 Cocaine and metabolites        300 THC                            50 Performed at [facility], [place] [address], [place], [place] [address]   Urinalysis, Routine w reflex microscopic     Status: Abnormal   Collection Time: 02/13/21 12:22 AM  Result Value Ref Range   Color, Urine AMBER (A) YELLOW    Comment: BIOCHEMICALS MAY BE AFFECTED BY COLOR   APPearance CLOUDY (A) CLEAR   Specific Gravity, Urine 1.016 1.005 - 1.030   pH 5.0 5.0 - 8.0   Glucose, UA NEGATIVE NEGATIVE mg/dL   Hgb urine dipstick NEGATIVE NEGATIVE   Bilirubin Urine NEGATIVE NEGATIVE   Ketones, ur NEGATIVE NEGATIVE mg/dL   Protein, ur 30 (A) NEGATIVE mg/dL   Nitrite POSITIVE (A) NEGATIVE   Leukocytes,Ua SMALL (A) NEGATIVE   RBC / HPF 6-10 0 - 5 RBC/hpf   WBC, UA >50 (H) 0 - 5 WBC/hpf   Bacteria, UA RARE (A) NONE SEEN   Squamous Epithelial / LPF 0-5 0 - 5   Mucus PRESENT    Hyaline Casts, UA PRESENT     Comment: Performed at [facility], [address] [place], [place] [address]  CBG monitoring, ED     Status: Abnormal   Collection Time: 02/13/21 11:03 PM  Result Value Ref Range   Glucose-Capillary 149 (H) 70 - 99 mg/dL    Comment: Glucose reference range applies only to samples taken after fasting for at least 8 hours.  Ammonia     Status: None   Collection Time: 02/13/21 11:05 PM  Result Value Ref Range   Ammonia 21 9 - 35 umol/L    Comment: Performed at [facility], [place] [address] [place], [place] [address]  Comprehensive metabolic panel     Status: Abnormal   Collection Time: 02/13/21 11:05 PM  Result Value Ref Range   Sodium 139 135 - 145 mmol/L   Potassium 2.9 (L) 3.5 - 5.1 mmol/L   Chloride 106 98 - 111  mmol/L   CO2 22 22 - 32 mmol/L   Glucose, Bld 162 (H) 70 - 99 mg/dL    Comment: Glucose reference range applies only to samples taken after fasting for at least 8 hours.   BUN 19 8 - 23 mg/dL   Creatinine,  Ser 1.43 (H) 0.44 - 1.00 mg/dL   Calcium 8.9 8.9 - 10.3 mg/dL   Total Protein 6.4 (L) 6.5 - 8.1 g/dL   Albumin 3.8 3.5 - 5.0 g/dL   AST 17 15 - 41 U/L   ALT 12 0 - 44 U/L   Alkaline Phosphatase 75 38 - 126 U/L   Total Bilirubin 0.8 0.3 - 1.2 mg/dL   GFR, Estimated 35 (L) >60 mL/min    Comment: (NOTE) Calculated using the CKD-EPI Creatinine Equation (2021)    Anion gap 11 5 - 15    Comment: Performed at [facility] [address], [place], [place] [address]  Lactic acid, plasma     Status: Abnormal   Collection Time: 02/13/21 11:05 PM  Result Value Ref Range   Lactic Acid, Venous 2.4 (HH) 0.5 - 1.9 mmol/L    Comment: CRITICAL RESULT CALLED TO, READ BACK BY AND VERIFIED WITH: Darnell Level TATE, RN 2376 02/13/21 A GASKINS Performed at [facility], [place] [address], [place], [place] [address]   Ethanol     Status: None   Collection Time: 02/13/21 11:05 PM  Result Value Ref Range   Alcohol, Ethyl (B) <10 <10 mg/dL    Comment: (NOTE) Lowest detectable limit for serum alcohol is 10 mg/dL.  For medical purposes only. Performed at [name], [place] [address], [place], [place] [address]   CBC WITH DIFFERENTIAL     Status: Abnormal   Collection Time: 02/13/21 11:05 PM  Result Value Ref Range   WBC 11.8 (H) 4.0 - 10.5 K/uL   RBC 4.14 3.87 - 5.11 MIL/uL   Hemoglobin 14.5 12.0 - 15.0 g/dL   HCT 43.3 36.0 - 46.0 %   MCV 104.6 (H) 80.0 - 100.0 fL   MCH 35.0 (H) 26.0 - 34.0 pg   MCHC 33.5 30.0 - 36.0 g/dL   RDW 12.7 11.5 - 15.5 %   Platelets 204 150 - 400 K/uL   nRBC 0.0 0.0 - 0.2 %   Neutrophils Relative % 62 %   Neutro Abs 7.4 1.7 - 7.7 K/uL   Lymphocytes Relative 29 %   Lymphs Abs 3.5 0.7 - 4.0 K/uL   Monocytes Relative 6 %   Monocytes Absolute 0.7 0.1 - 1.0 K/uL    Eosinophils Relative 1 %   Eosinophils Absolute 0.1 0.0 - 0.5 K/uL   Basophils Relative 1 %   Basophils Absolute 0.1 0.0 - 0.1 K/uL   Immature Granulocytes 1 %   Abs Immature Granulocytes 0.07 0.00 - 0.07 K/uL    Comment: Performed at [facility] [address] [place], [place] [address]  Blood Cultures (routine x 2)     Status: None (Preliminary result)   Collection Time: 02/13/21 11:10 PM   Specimen: BLOOD RIGHT HAND  Result Value Ref Range   Specimen Description BLOOD RIGHT HAND    Special Requests      BOTTLES DRAWN AEROBIC AND ANAEROBIC Blood Culture results may not be optimal due to an inadequate volume of blood received in culture bottles   Culture      NO GROWTH < 12 HOURS Performed at [facility] [address] [place], [place] [address]    Report Status PENDING   I-Stat venous blood gas, ED     Status: Abnormal   Collection Time: 02/13/21 11:34 PM  Result Value Ref Range   pH, Ven 7.429 7.250 - 7.430   pCO2, Ven 37.7 (L)  44.0 - 60.0 mmHg   pO2, Ven 140.0 (H) 32.0 - 45.0 mmHg   Bicarbonate 25.0 20.0 - 28.0 mmol/L   TCO2 26 22 - 32 mmol/L   O2 Saturation 99.0 %   Acid-Base Excess 1.0 0.0 - 2.0 mmol/L   Sodium 142 135 - 145 mmol/L   Potassium 2.9 (L) 3.5 - 5.1 mmol/L   Calcium, Ion 1.05 (L) 1.15 - 1.40 mmol/L   HCT 42.0 36.0 - 46.0 %   Hemoglobin 14.3 12.0 - 15.0 g/dL   Sample type VENOUS   Blood Cultures (routine x 2)     Status: None (Preliminary result)   Collection Time: 02/13/21 11:55 PM   Specimen: BLOOD  Result Value Ref Range   Specimen Description BLOOD RIGHT ANTECUBITAL    Special Requests      AEROBIC BOTTLE ONLY Blood Culture results may not be optimal due to an inadequate volume of blood received in culture bottles   Culture      NO GROWTH < 12 HOURS Performed at [facility] [address], [place], [place] [address]    Report Status PENDING   TSH     Status: Abnormal   Collection Time: 02/14/21 12:44 AM  Result Value  Ref Range   TSH 17.945 (H) 0.350 - 4.500 uIU/mL    Comment: Performed by a 3rd Generation assay with a functional sensitivity of <=0.01 uIU/mL. Performed at [facility], [place] [address] [place], [place] [address]   Resp Panel by RT-PCR (Flu A&B, Covid) Nasopharyngeal Swab     Status: None   Collection Time: 02/14/21  3:16 AM   Specimen: Nasopharyngeal Swab; Nasopharyngeal(NP) swabs in vial transport medium  Result Value Ref Range   SARS Coronavirus 2 by RT PCR NEGATIVE NEGATIVE    Comment: (NOTE) SARS-CoV-2 target nucleic acids are NOT DETECTED.  The SARS-CoV-2 RNA is generally detectable in upper respiratory specimens during the acute phase of infection. The lowest concentration of SARS-CoV-2 viral copies this assay can detect is 138 copies/mL. A negative result does not preclude SARS-Cov-2 infection and should not be used as the sole basis for treatment or other patient management decisions. A negative result may occur with  improper specimen collection/handling, submission of specimen other than nasopharyngeal swab, presence of viral mutation(s) within the areas targeted by this assay, and inadequate number of viral copies(<138 copies/mL). A negative result must be combined with clinical observations, patient history, and epidemiological information. The expected result is Negative.  Fact Sheet for Patients:  EntrepreneurPulse.com.au  Fact Sheet for Healthcare Providers:  IncredibleEmployment.be  This test is no t yet approved or cleared by the [place] FDA and  has been authorized for detection and/or diagnosis of SARS-CoV-2 by FDA under an Emergency Use Authorization (EUA). This EUA will remain  in effect (meaning this test can be used) for the duration of the COVID-19 declaration under Section 564(b)(1) of the Act, 21 U.S.C.section 360bbb-3(b)(1), unless the authorization is terminated  or revoked sooner.       Influenza A  by PCR NEGATIVE NEGATIVE   Influenza B by PCR NEGATIVE NEGATIVE    Comment: (NOTE) The Xpert Xpress SARS-CoV-2/FLU/RSV plus assay is intended as an aid in the diagnosis of influenza from Nasopharyngeal swab specimens and should not be used as a sole basis for treatment. Nasal washings and aspirates are unacceptable for Xpert Xpress SARS-CoV-2/FLU/RSV testing.  Fact Sheet for Patients: EntrepreneurPulse.com.au  Fact Sheet for Healthcare Providers: IncredibleEmployment.be  This test is not  yet approved or cleared by the [place] and has been authorized for detection and/or diagnosis of SARS-CoV-2 by FDA under an Emergency Use Authorization (EUA). This EUA will remain in effect (meaning this test can be used) for the duration of the COVID-19 declaration under Section 564(b)(1) of the Act, 21 U.S.C. section 360bbb-3(b)(1), unless the authorization is terminated or revoked.  Performed at [facility], [place] [address], [place], [place] [address]   Lactic acid, plasma     Status: None   Collection Time: 02/14/21  4:45 AM  Result Value Ref Range   Lactic Acid, Venous 1.5 0.5 - 1.9 mmol/L    Comment: Performed at [facility] [address] [place], [place] [address]  Basic metabolic panel     Status: Abnormal   Collection Time: 02/14/21  4:46 AM  Result Value Ref Range   Sodium 135 135 - 145 mmol/L    Comment: DELTA CHECK NOTED   Potassium 3.6 3.5 - 5.1 mmol/L    Comment: DELTA CHECK NOTED   Chloride 103 98 - 111 mmol/L   CO2 24 22 - 32 mmol/L   Glucose, Bld 101 (H) 70 - 99 mg/dL    Comment: Glucose reference range applies only to samples taken after fasting for at least 8 hours.   BUN 15 8 - 23 mg/dL   Creatinine, Ser 1.10 (H) 0.44 - 1.00 mg/dL   Calcium 8.1 (L) 8.9 - 10.3 mg/dL   GFR, Estimated 49 (L) >60 mL/min    Comment: (NOTE) Calculated using the CKD-EPI Creatinine Equation (2021)    Anion gap 8 5 - 15     Comment: Performed at [facility] [address] [place], [place] [address]  CBC     Status: Abnormal   Collection Time: 02/14/21  4:46 AM  Result Value Ref Range   WBC 9.4 4.0 - 10.5 K/uL   RBC 3.92 3.87 - 5.11 MIL/uL   Hemoglobin 13.6 12.0 - 15.0 g/dL   HCT 40.8 36.0 - 46.0 %   MCV 104.1 (H) 80.0 - 100.0 fL   MCH 34.7 (H) 26.0 - 34.0 pg   MCHC 33.3 30.0 - 36.0 g/dL   RDW 12.7 11.5 - 15.5 %   Platelets 173 150 - 400 K/uL   nRBC 0.0 0.0 - 0.2 %    Comment: Performed at [facility], [place] [address], [place], [place] [address]  Lactic acid, plasma     Status: None   Collection Time: 02/14/21  6:16 AM  Result Value Ref Range   Lactic Acid, Venous 1.4 0.5 - 1.9 mmol/L    Comment: Performed at [facility] [address], [place], [place] [address]  Magnesium     Status: None   Collection Time: 02/14/21  6:16 AM  Result Value Ref Range   Magnesium 2.3 1.7 - 2.4 mg/dL    Comment: Performed at [facility] [address], [place],  [address]  CBG monitoring, ED     Status: None   Collection Time: 02/14/21  9:20 AM  Result Value Ref Range   Glucose-Capillary 94 70 - 99 mg/dL    Comment: Glucose reference range applies only to samples taken after fasting for at least 8 hours.  T4, free     Status: Abnormal   Collection Time: 02/14/21  9:28 AM  Result Value Ref Range   Free T4 1.21 (H) 0.61 - 1.12 ng/dL  Comment: (NOTE) Biotin ingestion may interfere with free T4 tests. If the results are inconsistent with the TSH level, previous test results, or the clinical presentation, then consider biotin interference. If needed, order repeat testing after stopping biotin. Performed at [facility], [place] [address] [place], [place] [address]   TSH     Status: None   Collection Time: 02/14/21  9:28 AM  Result Value Ref Range   TSH 3.476 0.350 - 4.500 uIU/mL    Comment: Performed by a 3rd Generation assay with a functional sensitivity of <=0.01  uIU/mL. Performed at [facility], [place] [address], [place], [place] [address]   CBG monitoring, ED     Status: None   Collection Time: 02/14/21 11:44 AM  Result Value Ref Range   Glucose-Capillary 80 70 - 99 mg/dL    Comment: Glucose reference range applies only to samples taken after fasting for at least 8 hours.   CT Angio Head W or Wo Contrast  Result Date: 02/14/2021 CLINICAL DATA:  Sudden severe headache EXAM: CT ANGIOGRAPHY HEAD TECHNIQUE: Multidetector CT imaging of the head was performed using the standard protocol during bolus administration of intravenous contrast. Multiplanar CT image reconstructions and MIPs were obtained to evaluate the vascular anatomy. CONTRAST:  16mL OMNIPAQUE IOHEXOL 350 MG/ML SOLN COMPARISON:  Head CT 02/13/2021 FINDINGS: POSTERIOR CIRCULATION: --Vertebral arteries: Right V4 segment is non-opacified. Normal left. --Inferior cerebellar arteries: Normal. --Basilar artery: Normal. --Superior cerebellar arteries: Normal. --Posterior cerebral arteries: Moderate narrowing at the distal right P3 segment. No occlusion or stenosis of the left PCA. ANTERIOR CIRCULATION: --Intracranial internal carotid arteries: Normal. --Anterior cerebral arteries (ACA): There is moderate stenosis of the proximal left A2 segment. No other stenosis or occlusion. --Middle cerebral arteries (MCA): Normal. ANATOMIC VARIANTS: None Review of the MIP images confirms the above findings. IMPRESSION: 1. No intracranial arterial occlusion or high-grade stenosis. 2. Occlusion of the right vertebral artery V4 segment. 3. Moderate narrowing of the distal right P3 segment. 4. Moderate stenosis of the proximal left A2 segment. Electronically Signed   By: Ulyses Jarred M.D.   On: 02/14/2021 02:34   CT Head Wo Contrast  Result Date: 02/13/2021 CLINICAL DATA:  Loss of consciousness EXAM: CT HEAD WITHOUT CONTRAST TECHNIQUE: Contiguous axial images were obtained from the base of the skull through the  vertex without intravenous contrast. COMPARISON:  None. FINDINGS: Brain: There is no mass, hemorrhage or extra-axial collection. There is generalized atrophy without lobar predilection. Hypodensity of the white matter is most commonly associated with chronic microvascular disease. Small calcified meningioma in the left posterior fossa. Vascular: Atherosclerotic calcification of the internal carotid arteries at the skull base. No abnormal hyperdensity of the major intracranial arteries or dural venous sinuses. Skull: The visualized skull base, calvarium and extracranial soft tissues are normal. Sinuses/Orbits: No fluid levels or advanced mucosal thickening of the visualized paranasal sinuses. No mastoid or middle ear effusion. The orbits are normal. IMPRESSION: Generalized atrophy and chronic microvascular ischemia without acute intracranial abnormality. Electronically Signed   By: Ulyses Jarred M.D.   On: 02/13/2021 23:45   CT Angio Chest Pulmonary Embolism (PE) W or WO Contrast  Result Date: 02/14/2021 CLINICAL DATA:  Pulmonary embolism (PE) suspected, high prob EXAM: CT ANGIOGRAPHY CHEST WITH CONTRAST TECHNIQUE: Multidetector CT imaging of the chest was performed using the standard protocol during bolus administration of intravenous contrast. Multiplanar CT image reconstructions and MIPs were obtained to evaluate the vascular anatomy. CONTRAST:  66mL OMNIPAQUE IOHEXOL 350 MG/ML  SOLN COMPARISON:  None. FINDINGS: Cardiovascular: Satisfactory opacification of the pulmonary arteries to the segmental level. No evidence of pulmonary embolism. Normal heart size. No pericardial effusion. Mediastinum/Nodes: Hiatal hernia. Soft tissue at the right aspect of the hernia. Representative measurement of 2.4 cm on series 5, image 72. Lungs/Pleura: Bibasilar atelectasis/scarring. No pleural effusion or pneumothorax. Upper Abdomen: No acute abnormality. Musculoskeletal: Levoscoliosis distorting the thorax. Chronic appearing  compression fractures. Review of the MIP images confirms the above findings. IMPRESSION: No evidence of acute pulmonary embolism or other acute abnormality. Hiatal hernia. There is soft tissue at the right aspect of the hernia likely reflecting adenopathy and suspicious for malignancy. Electronically Signed   By: Macy Mis M.D.   On: 02/14/2021 08:10   MR BRAIN WO CONTRAST  Result Date: 02/14/2021 CLINICAL DATA:  Delirium EXAM: MRI HEAD WITHOUT CONTRAST TECHNIQUE: Multiplanar, multiecho pulse sequences of the brain and surrounding structures were obtained without intravenous contrast. COMPARISON:  Yesterday and CTA from yesterday FINDINGS: Brain: No acute infarction, hemorrhage, hydrocephalus, extra-axial collection or mass lesion. Advanced brain atrophy, especially at the temporal lobes, correlating well with history of Alzheimer's disease. Confluent chronic small vessel ischemia which is diffuse. Small remote left cerebellar infarction. Vascular: Occluded right vertebral artery with absent flow void, known from CTA yesterday. Skull and upper cervical spine: Normal marrow signal Sinuses/Orbits: Bilateral cataract resection. Partial right mastoid opacification. IMPRESSION: 1. No emergent or reversible finding. 2. Advanced brain atrophy and chronic small vessel ischemia. Electronically Signed   By: Jorje Guild M.D.   On: 02/14/2021 11:52   DG Chest Portable 1 View  Result Date: 02/14/2021 CLINICAL DATA:  Unresponsiveness. EXAM: PORTABLE CHEST 1 VIEW COMPARISON:  Chest radiograph dated 02/02/2020 FINDINGS: Diffuse interstitial prominence. No focal consolidation, pleural effusion or pneumothorax. Cardiomegaly. Atherosclerotic calcification of the aorta. No acute osseous pathology. IMPRESSION: 1. No acute cardiopulmonary process. 2. Cardiomegaly. Electronically Signed   By: Anner Crete M.D.   On: 02/14/2021 02:44   EEG adult  Result Date: 02/14/2021 Lora Havens, MD     02/14/2021 10:22  AM Patient Name: CARMYN HAMM MRN: [id number] Epilepsy Attending: Lora Havens Referring Physician/Provider: Dr Niel Hummer Date: 02/14/2021 Duration: 21.35 mins Patient history: 87 year old female with altered mental status.  EEG to evaluate for seizure. Level of alertness: Awake AEDs during EEG study: None Technical aspects: This EEG study was done with scalp electrodes positioned according to the 10-20 International system of electrode placement. Electrical activity was acquired at a sampling rate of 500Hz  and reviewed with a high frequency filter of 70Hz  and a low frequency filter of 1Hz . EEG data were recorded continuously and digitally stored. Description: The posterior dominant rhythm consists of 9 Hz activity of moderate voltage (25-35 uV) seen predominantly in posterior head regions, symmetric and reactive to eye opening and eye closing. EEG showed intermittent generalized polymorphic sharply contoured 5 to 6 Hz theta slowing with overriding 15 to 18 Hz generalized beta activity. Hyperventilation and photic stimulation were not performed.   ABNORMALITY - Intermittent slow, generalized IMPRESSION: This study is suggestive of mild diffuse encephalopathy, nonspecific etiology. No seizures or epileptiform discharges were seen throughout the recording. [place] (From admission, onward)     Start     Ordered   02/14/21 0725  T3, free  Once,   R        02/14/21 0725   02/14/21 0227  Urine Culture  Add-on,   AD  Question:  Indication  Answer:  Urgency/frequency   02/14/21 0226            Vitals/Pain Today's Vitals   02/14/21 1015 02/14/21 1030 02/14/21 1330 02/14/21 1345  BP: 128/74 126/73 136/80 (!) 146/75  Pulse: 84 84 77 78  Resp: 18 16 19 16   Temp:      TempSrc:      SpO2: 100% 100% 98% 99%  Weight:      Height:        Isolation Precautions No active isolations  Medications Medications  amLODipine (NORVASC) tablet 5 mg (5  mg Oral Given 02/14/21 1042)  memantine (NAMENDA) tablet 10 mg (10 mg Oral Given 02/14/21 1042)  levothyroxine (SYNTHROID) tablet 125 mcg (125 mcg Oral Given 02/14/21 0612)  heparin injection 5,000 Units (5,000 Units Subcutaneous Given 02/14/21 1320)  acetaminophen (TYLENOL) tablet 650 mg (has no administration in time range)    Or  acetaminophen (TYLENOL) suppository 650 mg (has no administration in time range)  senna-docusate (Senokot-S) tablet 1 tablet (has no administration in time range)  cefTRIAXone (ROCEPHIN) 1 g in sodium chloride 0.9 % 100 mL IVPB (1 g Intravenous Not Given 02/14/21 0357)  dextrose 5 % in lactated ringers infusion ( Intravenous New Bag/Given 02/14/21 0923)  buPROPion (WELLBUTRIN XL) 24 hr tablet 150 mg (has no administration in time range)  lactated ringers bolus 1,000 mL (0 mLs Intravenous Stopped 02/14/21 0228)  potassium chloride 10 mEq in 100 mL IVPB (0 mEq Intravenous Stopped 02/14/21 0446)  magnesium sulfate IVPB 2 g 50 mL (0 g Intravenous Stopped 02/14/21 0126)  iohexol (OMNIPAQUE) 350 MG/ML injection 50 mL (50 mLs Intravenous Contrast Given 02/14/21 0215)  cefTRIAXone (ROCEPHIN) 2 g in sodium chloride 0.9 % 100 mL IVPB (0 g Intravenous Stopped 02/14/21 0324)  iohexol (OMNIPAQUE) 350 MG/ML injection 50 mL (50 mLs Intravenous Contrast Given 02/14/21 0756)    Mobility non-ambulatory High fall risk   Focused Assessments Neuro Assessment Handoff:  Swallow screen pass? Yes          Neuro Assessment: Exceptions to WDL Neuro Checks:      Last Documented NIHSS Modified Score:   Has TPA been given? No If patient is a Neuro Trauma and patient is going to OR before floor call report to [place] nurse: [phone] or [phone]   R Recommendations: See Admitting Provider Note  Report given to:   Additional Notes: none

## 2021-02-14 NOTE — Consult Note (Signed)
Consultation Note Date: 02/14/2021   Patient Name: [name]  DOB: Apr 09, 1931  MRN: [id number]  Age / Sex: 87 y.o., female  PCP: [name], NP Referring Physician: Elmarie Shiley, MD  Reason for Consultation: Establishing goals of care  HPI/Patient Profile: 87 y.o. female  with past medical history of Alzheimer's dementia, hypertension,and hypothyroidism who presented to the emergency department on 02/13/2021 with decreased responsiveness. Reportedly patient was in her usual state of health when she suddenly became unresponsive around 10pm last night. Her care aide notified her daughter who confirmed by video and then called 911. Per the dispatcher's instructions, the aide started CPR. When EMS arrived, patient had significant respiratory depressionand was started on bag-valve-mask ventilation. After arriving to the ED, patient was noted to be apneic with no spontaneous respirations and the family requested the EDP to continue bag-valve-mask ventilation until the family could arrive. By the time family arrived, patient was breathing on her own. In the ED, labs revealed creatinine of 2.9 (baseline 1.43). CT head showed no acute intracranial process. MRI negative for acute finding, but showed advanced brain atrophy and chronic small vessel ischemia.  EEG negative for seizures and is suggestive of mild diffuse encephalopathy. Admitted to [facility] with sepsis and acute respiratory failure, secondary to UTI.    Clinical Assessment and Goals of Care: I have reviewed medical records including EPIC notes, labs and imaging, and went to see patient in the ED. She is not oriented, even to self and her speech is non-sensical. No family at bedside.   Later, I spoke with both daughters, [name] and [name] (separately) by phone to discuss diagnosis, prognosis, GOC, EOL wishes, disposition, and options. Patient has been  seen in the past by outpatient palliative services with [facility] (last visit was 05/23/20). I re-introduced Palliative Medicine as specialized medical care for people living with serious illness. It focuses on providing relief from the symptoms and stress of a serious illness.   We discussed a brief life review of the patient. Daughters describe her as formerly "very active" and "loved to help people". 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 She is widowed. After her husband passed, he daughter [name] came to live with her. [name] herself is unable to walk. They have caregivers 7 days per week, but not at night. Their main caregiver actually lives next door.   As far as functional status, patient is able to walk with a walker. She requires total assistance with bathing and dressing. She is able to feed herself. She is incontinent of bladder and sometimes of bowel.   We discussed her current illness and what it means in the larger context of her ongoing co-morbidities.  We reviewed that dementia is a progressive, non-curable disease underlying the patient's current acute medical conditions. We reviewed specific indicators of end-stage dementia, including inability to communicate, bed bound/non-ambulatory status, decreased oral intake, and incontinence of bowel/bladder. We reviewed imaging and EEG results,. Discussed that patient had acute encephalopathy secondary to UTI. Discussed she  may or may not return to her previous baseline.   The difference between full scope medical intervention and comfort care was considered. Provided education and counseling on the philosophy and benefits of hospice care. Discussed that it offers a holistic approach to care in the setting of end-stage illness/disease, and is about supporting the patient where they are while allowing the natural course to occur. Discussed they can provide personal care, support for the family, and help keep patient out of the  hospital.  Daughters tell me that they were under the impression that patient "already has a hospice nurse" but that she only calls to check in occasionally. I explained that patient was enrolled in community-based palliative care, not hospice. Discussion on the difference between Palliative and Hospice care was had per family request.  Provided education that palliative care may be offered during any phase of a serious illness, while hospice care is usually offered when a person is expected to live for 6 months or less. Daughters are agreeable for patient to transition into hospice if she is eligible.   Questions and concerns were addressed.  The family was encouraged to call with questions or concerns.    Primary decision maker: Daughters [place] and [place].     SUMMARY OF RECOMMENDATIONS   Continue current care Goal of care is to return home when medically stable Daughters are agreeable for hospice services at home PMT will continue to follow  Code Status/Advance Care Planning: DNR  Additional Recommendations (Limitations, Scope, Preferences): No Artificial Feeding  Prognosis:  < 6 months  Discharge Planning: Home with Hospice      Primary Diagnoses: Present on Admission:  Sepsis ([place])  Hypothyroidism  Essential hypertension  Dementia with behavioral disturbance   I have reviewed the medical record, interviewed the patient and family, and examined the patient. The following aspects are pertinent.  Past Medical History:  Diagnosis Date   Abnormality of gait    Alzheimer's disease ([place])    Calculus of kidney    Closed fracture of seventh cervical vertebra without mention of spinal cord injury    COVID-19 06/2019   Depressive disorder, not elsewhere classified    Edema    Edema    External hemorrhoids without mention of complication    Gastric ulcer, unspecified as acute or chronic, without mention of hemorrhage, perforation, or obstruction    GERD (gastroesophageal  reflux disease)    Hypertension    Insomnia, unspecified    Loss of weight    Osteoarthrosis, unspecified whether generalized or localized, unspecified site    Other protein-calorie malnutrition    Pain in thoracic spine    Personal history of fall    Unspecified constipation    Unspecified hereditary and idiopathic peripheral neuropathy    Unspecified hypothyroidism    Unspecified vitamin D deficiency    Urinary frequency     Family History  Problem Relation Age of Onset   Cancer Mother    CVA Father    Cancer Brother    Alcohol abuse Sister    Scheduled Meds:  amLODipine  5 mg Oral Daily   [START ON 02/15/2021] buPROPion  150 mg Oral Daily   heparin  5,000 Units Subcutaneous Q8H   [START ON 02/15/2021] levothyroxine  75 mcg Oral Daily   memantine  10 mg Oral BID   Continuous Infusions:  cefTRIAXone (ROCEPHIN)  IV     dextrose 5% lactated ringers 75 mL/hr at 02/14/21 0923   PRN Meds:.acetaminophen **OR** acetaminophen, senna-docusate  No Known Allergies Review of Systems  Unable to perform ROS: Dementia   Physical Exam Constitutional:      General: She is not in acute distress.    Appearance: She is ill-appearing.  Pulmonary:     Effort: Pulmonary effort is normal.  Neurological:     Mental Status: She is alert. She is disoriented.  Psychiatric:        Cognition and Memory: Cognition is impaired. Memory is impaired.    Vital Signs: BP (!) 146/75    Pulse 78    Temp 97.6 F (36.4 C) (Oral)    Resp 16    Ht 5\' 4"  (1.626 m)    Wt 63 kg    SpO2 99%    BMI 23.84 kg/m  Pain Scale: Faces      SpO2: SpO2: 99 % O2 Device:SpO2: 99 % O2 Flow Rate: .O2 Flow Rate (L/min): 2 L/min   LBM:   Baseline Weight: Weight: 63 kg Most recent weight: Weight: 63 kg      Palliative Assessment/Data: PPS 20-30%     Time In: 1900 Time Out: 2012 Time Total: 72 minutes Greater than 50%  of this time was spent counseling and coordinating care related to the above  assessment and plan.  Signed by: Lavena Bullion, NP   Please contact Palliative Medicine Team phone at [phone] for questions and concerns.  For individual provider: See Shea Evans

## 2021-02-14 NOTE — ED Notes (Signed)
Family leaving call [name] [phone] Home Phone with updates

## 2021-02-14 NOTE — ED Notes (Signed)
Called for purple man to be placed

## 2021-02-15 DIAGNOSIS — R638 Other symptoms and signs concerning food and fluid intake: Secondary | ICD-10-CM

## 2021-02-15 LAB — CBC
HCT: 42.6 % (ref 36.0–46.0)
Hemoglobin: 14.2 g/dL (ref 12.0–15.0)
MCH: 34.2 pg — ABNORMAL HIGH (ref 26.0–34.0)
MCHC: 33.3 g/dL (ref 30.0–36.0)
MCV: 102.7 fL — ABNORMAL HIGH (ref 80.0–100.0)
Platelets: 147 10*3/uL — ABNORMAL LOW (ref 150–400)
RBC: 4.15 MIL/uL (ref 3.87–5.11)
RDW: 12.7 % (ref 11.5–15.5)
WBC: 8.9 10*3/uL (ref 4.0–10.5)
nRBC: 0 % (ref 0.0–0.2)

## 2021-02-15 LAB — VITAMIN B12: Vitamin B-12: 1890 pg/mL — ABNORMAL HIGH (ref 180–914)

## 2021-02-15 LAB — BASIC METABOLIC PANEL
Anion gap: 13 (ref 5–15)
BUN: 8 mg/dL (ref 8–23)
CO2: 20 mmol/L — ABNORMAL LOW (ref 22–32)
Calcium: 8.4 mg/dL — ABNORMAL LOW (ref 8.9–10.3)
Chloride: 102 mmol/L (ref 98–111)
Creatinine, Ser: 0.98 mg/dL (ref 0.44–1.00)
GFR, Estimated: 56 mL/min — ABNORMAL LOW (ref >60)
Glucose, Bld: 83 mg/dL (ref 70–99)
Potassium: 2.9 mmol/L — ABNORMAL LOW (ref 3.5–5.1)
Sodium: 135 mmol/L (ref 135–145)

## 2021-02-15 LAB — GLUCOSE, CAPILLARY
Glucose-Capillary: 122 mg/dL — ABNORMAL HIGH (ref 70–99)
Glucose-Capillary: 78 mg/dL (ref 70–99)
Glucose-Capillary: 93 mg/dL (ref 70–99)
Glucose-Capillary: 96 mg/dL (ref 70–99)
Glucose-Capillary: 99 mg/dL (ref 70–99)

## 2021-02-15 LAB — T3, FREE: T3, Free: 1.8 pg/mL — ABNORMAL LOW (ref 2.0–4.4)

## 2021-02-15 MED ORDER — POTASSIUM CHLORIDE 10 MEQ/100ML IV SOLN
10.0000 meq | INTRAVENOUS | Status: AC
  Administered 2021-02-15 (×4): 10 meq via INTRAVENOUS
  Filled 2021-02-15 (×4): qty 100

## 2021-02-15 NOTE — TOC Initial Note (Addendum)
Transition of Care [facility]) - Initial/Assessment Note    Patient Details  Name: [name] MRN: [id number] Date of Birth: 07/27/1931  Transition of Care [facility]) CM/SW Contact:    Bethena Roys, RN Phone Number: 02/15/2021, 2:00 PM  Clinical Narrative:  Case Manager received a secure chat to offer choice for home hospice services from the Palliative NP. Patient is currently active with [facility] with Palliative Services. Case Manager spoke with the daughter [name] regarding plan of care and family is agreeable to hospice services with [facility]. Patient has durable medical equipment wheelchair, bedside commode, shower chair, and rolling walker. Daughter in need of a hospital bed and overbed table. Referral made to Daphene Calamity for [facility] and equipment will be ordered for the home. Per Lorayne Bender, the hospital bed should be delivered by 02-16-21. Family is deciding on transport home either via car or ambulance transport (worried about the fee). Family will assess at discharge which mode of transportation is the best. Case Manager will continue to follow for additional needs.                1514 02-15-21 Case Manager received notification from [place] : Per [facility] provider. Patient was not deemed appropriate for hospice services. Plan is for patient to continue with PMT and switch to Hospice if she declines further. No DME to be delivered to the home @ this time via [facility]. [place] has alerted the family. MD is aware. Case Manager did call the daughter to see if they want the hospital bed via [employer] the family will need to discuss equipment tonight and she will get back to the staff.   Expected Discharge Plan: Home w Hospice Care Barriers to Discharge: Continued Medical Work up   Patient Goals and CMS Choice Patient states their goals for this hospitalization and ongoing recovery are:: family has decided home with  hospice services.   Choice offered to / list presented to : Adult Children  Expected Discharge Plan and Services Expected Discharge Plan: [place] In-house Referral: NA Discharge Planning Services: CM Consult Post Acute Care Choice: Hospice (Patient is currently active with Palliative Services via [facility] Living arrangements for the past 2 months: Single Family Home                 DME Arranged: Hospital bed, Overbed table DME Agency: Hospice and [place] (Known as [facility] Date DME Agency Contacted: 02/15/21 Time DME Agency Contacted: 1350 Representative spoke with at DME Agency: [place] Arranged: RN [facility] Agency: Hospice and [place] ([place]) Date [place]: 02/15/21 Time [place]: 1350 Representative spoke with at [place]: Daphene Calamity  Prior Living Arrangements/Services Living arrangements for the past 2 months: [place] Lives with:: Adult Children Patient language and need for interpreter reviewed:: Yes Do you feel safe going back to the place where you live?: Yes      Need for Family Participation in Patient Care: Yes (Comment) Care giver support system in place?: Yes (comment) Current home services: DME (Patient has DME WC, [place], RW,BSC in the home.) Criminal Activity/Legal Involvement Pertinent to Current Situation/Hospitalization: No - Comment as needed  Activities of Daily Living      Permission Sought/Granted Permission sought to share information with : Family Supports, Customer service manager, Case Manager Permission granted to share information with : Yes, Verbal Permission  Granted     Permission granted to share info w AGENCY: [facility]        Emotional Assessment Appearance:: Appears stated age Attitude/Demeanor/Rapport: Unable to Assess Affect (typically observed): Unable to Assess    Alcohol / Substance Use: Not Applicable Psych Involvement: No (comment)  Admission diagnosis:  Acute cystitis with hematuria [N30.01] Sepsis ([place]) [A41.9] Altered mental status, unspecified altered mental status type [R41.82] Patient Active Problem List   Diagnosis Date Noted   Sepsis ([place]) 02/14/2021   UTI (urinary tract infection) 02/14/2021   Respiratory failure with hypoxia ([place]) 02/14/2021   Prolonged QT interval 02/14/2021   CKD (chronic kidney disease), stage III ([place]) 02/14/2021   Severe episode of recurrent major depressive disorder, without psychotic features ([place]) 73/22/0254   Complicated UTI (urinary tract infection) 06/15/2019   Generalized weakness 06/15/2019   Alzheimer's dementia with behavioral disturbance ([place]) 06/15/2019   COVID-19 virus infection 06/15/2019   BRBPR (bright red blood per rectum) 06/30/2017   Colitis 06/30/2017   Protein-calorie malnutrition, severe 11/13/2016   Multiple falls    Encephalopathy    Agitation    Slurred speech 11/10/2016   Vitamin D deficiency 03/07/2015   Hx of fracture of vertebral column 12/28/2014   Constipation 07/27/2013   Fracture of vertebra 07/27/2013   Loss of weight 07/13/2013   Peripheral vascular disease, unspecified ([place]) 07/13/2013   Epigastric mass 07/13/2013   Dementia with behavioral disturbance    Major depression ([place])    Insomnia, unspecified    Fall 09/07/2012   Fracture of right distal radius 09/07/2012   Hypothyroidism 08/18/2006   HYPERCHOLESTEROLEMIA 08/18/2006   Essential hypertension 08/18/2006   Restless legs 08/18/2006   PCP:  [name], NP Pharmacy:   [facility] [address] [place] [address] Phone: [phone] Fax: [phone]    Readmission Risk Interventions No flowsheet data found.

## 2021-02-15 NOTE — Progress Notes (Signed)
HOSPITAL MEDICINE OVERNIGHT EVENT NOTE    Nursing reports that patient has been exhibiting severe coughing with any attempted oral intake this morning including sips of water.  Temporarily making patient n.p.o., SLP evaluation ordered for this morning for further evaluation.  Gabrielle Emerald  MD [facility]

## 2021-02-15 NOTE — Progress Notes (Signed)
[facility]) Hospital Liaison Note   Received request from Transitions of Care Manager, Hassan Rowan, for hospice services at home after discharge. Chart and patient information under review by [facility] physician. Hospice eligibility is pending.    Spoke with Ihor Austin, daughter, [phone] to initiate education related to hospice philosophy, services, and team approach to care. Family verbalized understanding of information given. Per discussion, the plan is for patient to discharge home via private vehicle once cleared to DC.    DME Needed: Hospital Bed Bedside Table 02 (2L/min) and portable 02 DME in the Home (purchased privately): [employer]  Address verified and is correct in the chart. Ihor Austin, daughter, [phone] is the family member to contact to arrange time of equipment delivery.    Please send signed and completed DNR home with patient/family. Please provide prescriptions at discharge as needed to ensure ongoing symptom management.    [facility] information and contact numbers given to [place] information shared with TOC.   Please call with any questions/concerns.    Thank you for the opportunity to participate in this patient's care.   Daphene Calamity, MSW [facility] Liaison  [phone]

## 2021-02-15 NOTE — Progress Notes (Addendum)
Patient coughing excessively this am while consuming water. Dr. Cyd Silence notified. New order for NPO and SLP consult noted.

## 2021-02-15 NOTE — Progress Notes (Signed)
PROGRESS NOTE    [name]  ERD:[id number] DOB: 18-May-1931 DOA: 02/13/2021 PCP: [name], NP   Brief Narrative: 87 year old with past medical history significant for Alzheimer's dementia, hypertension, hypothyroidism who presents via EMS with respiratory failure, decreased responsiveness and respiratory distress.  Patient was in her normal state of health when she suddenly became unresponsive the night of admission.  The aide started CPR.  911 instruction even though patient had a DNR form but the aide did not mention that to 911.  When EMS arrived patient had significant respiratory depression and was a started on a bag-valve-mask to aid respiration.  After arriving in the emergency room patient was noted to be apneic with no spontaneous respiration and the family requested the ER doctor to continue bag-valve-mask therapy until family could get there in 5 or 10 minutes.  When family arrived patient  spontaneously started breathing on her own and over the next few hours slowly had periods where she will will move her extremity and upper her eyes and occasionally answer questions with single word answers.   Assessment & Plan:   Principal Problem:   Sepsis ([place]) Active Problems:   Hypothyroidism   Essential hypertension   Dementia with behavioral disturbance   UTI (urinary tract infection)   Respiratory failure with hypoxia (HCC)   Prolonged QT interval   CKD (chronic kidney disease), stage III (HCC)   1-Sepsis: secondary to UTI.  Patient presents with hypothermia,  respiratory failure, UA consistent with UTI. She was placed on  air Hugger.  Continue with IV ceftriaxone and IV fluids Urine culture growing E coli. Follow sensitivity   2-Respiratory arrest: Unclear etiology, suspect related to hypothermia and sepsis. MRI negative for stroke, CTA chest negative for PE. EEG negative for seizures ECHO; Normal EF Discussed with family, patient continued to be  DNR. Palliative care consulted  3-Acute Metabolic Encephalopathy; secondary to infection, resp arrest. And underline dementia.  Patient will benefit form palliative and hospice care.  She is  more alert today, but still didn't pass swallow evaluation.  Ammonia normal. B12 not low.  MRI negative for stroke.   Hypokalemia: Replete IV  4-Hypothyroidism: TSH elevated initially at 17.  Repeated TSH with now 3.4 Continue with current home meds dose.  HTN; on Norvasc.   CKD stage IIIB;  Monitor. Continue with IV fluids.   Prolong QT. Replete electrolytes.  Dementia; Continue with Namenda. Hold Aricept due to prolong QT  Estimated body mass index is 23.84 kg/m as calculated from the following:   Height as of this encounter: 5\' 4"  (1.626 m).   Weight as of this encounter: 63 kg.   DVT prophylaxis: Heparin  Code Status: DNR Family Communication: Daughter at bedside 12/29 Disposition Plan:  Status is: Inpatient  Remains inpatient appropriate because: AMS        Consultants:  Palliative  Procedures:  ECHO  Antimicrobials:    Subjective: She open her eyes, say few words, goes back to sleep  Objective: Vitals:   02/15/21 0001 02/15/21 0333 02/15/21 0729 02/15/21 1132  BP: 132/87 101/88 (!) 161/93 (!) 129/116  Pulse: 85 72 76 87  Resp: 18 15 15 15   Temp: (!) 97.3 F (36.3 C) 97.6 F (36.4 C) 97.8 F (36.6 C) 98 F (36.7 C)  TempSrc: Axillary Axillary Oral Oral  SpO2: 97% 90% 95% 95%  Weight:      Height:        Intake/Output Summary (Last 24 hours) at 02/15/2021 1341 Last  data filed at 02/15/2021 0300 Gross per 24 hour  Intake 252.88 ml  Output --  Net 252.88 ml    Filed Weights   02/13/21 2316  Weight: 63 kg    Examination:  General exam: Sleepy Respiratory system: CTA Cardiovascular system: S 1, S 2  RRR Gastrointestinal system: BS present, soft, nt Central nervous system: Sleepy Extremities: No edema    Data Reviewed: I have  personally reviewed following labs and imaging studies  CBC: Recent Labs  Lab 02/13/21 2305 02/13/21 2334 02/14/21 0446 02/15/21 0052  WBC 11.8*  --  9.4 8.9  NEUTROABS 7.4  --   --   --   HGB 14.5 14.3 13.6 14.2  HCT 43.3 42.0 40.8 42.6  MCV 104.6*  --  104.1* 102.7*  PLT 204  --  173 147*    Basic Metabolic Panel: Recent Labs  Lab 02/13/21 2305 02/13/21 2334 02/14/21 0446 02/14/21 0616 02/15/21 0052  NA 139 142 135  --  135  K 2.9* 2.9* 3.6  --  2.9*  CL 106  --  103  --  102  CO2 22  --  24  --  20*  GLUCOSE 162*  --  101*  --  83  BUN 19  --  15  --  8  CREATININE 1.43*  --  1.10*  --  0.98  CALCIUM 8.9  --  8.1*  --  8.4*  MG  --   --   --  2.3  --     GFR: Estimated Creatinine Clearance: 34.9 mL/min (by C-G formula based on SCr of 0.98 mg/dL). Liver Function Tests: Recent Labs  Lab 02/13/21 2305  AST 17  ALT 12  ALKPHOS 75  BILITOT 0.8  PROT 6.4*  ALBUMIN 3.8    No results for input(s): LIPASE, AMYLASE in the last 168 hours. Recent Labs  Lab 02/13/21 2305 02/14/21 1645  AMMONIA 21 30    Coagulation Profile: No results for input(s): INR, PROTIME in the last 168 hours. Cardiac Enzymes: No results for input(s): CKTOTAL, CKMB, CKMBINDEX, TROPONINI in the last 168 hours. BNP (last 3 results) No results for input(s): PROBNP in the last 8760 hours. HbA1C: No results for input(s): HGBA1C in the last 72 hours. CBG: Recent Labs  Lab 02/14/21 2057 02/15/21 0021 02/15/21 0357 02/15/21 0728 02/15/21 1136  GLUCAP 96 96 93 78 99    Lipid Profile: No results for input(s): CHOL, HDL, LDLCALC, TRIG, CHOLHDL, LDLDIRECT in the last 72 hours. Thyroid Function Tests: Recent Labs    02/14/21 0928  TSH 3.476  FREET4 1.21*  T3FREE 1.8*    Anemia Panel: Recent Labs    02/15/21 0052  VITAMINB12 1,890*   Sepsis Labs: Recent Labs  Lab 02/13/21 2305 02/14/21 0445 02/14/21 0616  LATICACIDVEN 2.4* 1.5 1.4     Recent Results (from the past  240 hour(s))  Urine Culture     Status: Abnormal (Preliminary result)   Collection Time: 02/13/21  2:37 PM   Specimen: In/Out Cath Urine  Result Value Ref Range Status   Specimen Description IN/OUT CATH URINE  Final   Special Requests   Final    NONE Performed at [facility], [place] [address], [place], [place] [address]    Culture >=100,000 COLONIES/mL ESCHERICHIA COLI (A)  Final   Report Status PENDING  Incomplete  Blood Cultures (routine x 2)     Status: None (Preliminary result)   Collection Time: 02/13/21 11:10 PM  Specimen: BLOOD RIGHT HAND  Result Value Ref Range Status   Specimen Description BLOOD RIGHT HAND  Final   Special Requests   Final    BOTTLES DRAWN AEROBIC AND ANAEROBIC Blood Culture results may not be optimal due to an inadequate volume of blood received in culture bottles   Culture   Final    NO GROWTH 1 DAY Performed at [facility], [place] [address] [place], [place] [address]    Report Status PENDING  Incomplete  Blood Cultures (routine x 2)     Status: None (Preliminary result)   Collection Time: 02/13/21 11:55 PM   Specimen: BLOOD  Result Value Ref Range Status   Specimen Description BLOOD RIGHT ANTECUBITAL  Final   Special Requests   Final    AEROBIC BOTTLE ONLY Blood Culture results may not be optimal due to an inadequate volume of blood received in culture bottles   Culture   Final    NO GROWTH 1 DAY Performed at [facility], [place] [address], [place], [place] [address]    Report Status PENDING  Incomplete  Resp Panel by RT-PCR (Flu A&B, Covid) Nasopharyngeal Swab     Status: None   Collection Time: 02/14/21  3:16 AM   Specimen: Nasopharyngeal Swab; Nasopharyngeal(NP) swabs in vial transport medium  Result Value Ref Range Status   SARS Coronavirus 2 by RT PCR NEGATIVE NEGATIVE Final    Comment: (NOTE) SARS-CoV-2 target nucleic acids are NOT DETECTED.  The SARS-CoV-2 RNA is generally detectable in upper respiratory specimens during  the acute phase of infection. The lowest concentration of SARS-CoV-2 viral copies this assay can detect is 138 copies/mL. A negative result does not preclude SARS-Cov-2 infection and should not be used as the sole basis for treatment or other patient management decisions. A negative result may occur with  improper specimen collection/handling, submission of specimen other than nasopharyngeal swab, presence of viral mutation(s) within the areas targeted by this assay, and inadequate number of viral copies(<138 copies/mL). A negative result must be combined with clinical observations, patient history, and epidemiological information. The expected result is Negative.  Fact Sheet for Patients:  EntrepreneurPulse.com.au  Fact Sheet for Healthcare Providers:  IncredibleEmployment.be  This test is no t yet approved or cleared by the [place] FDA and  has been authorized for detection and/or diagnosis of SARS-CoV-2 by FDA under an Emergency Use Authorization (EUA). This EUA will remain  in effect (meaning this test can be used) for the duration of the COVID-19 declaration under Section 564(b)(1) of the Act, 21 U.S.C.section 360bbb-3(b)(1), unless the authorization is terminated  or revoked sooner.       Influenza A by PCR NEGATIVE NEGATIVE Final   Influenza B by PCR NEGATIVE NEGATIVE Final    Comment: (NOTE) The Xpert Xpress SARS-CoV-2/FLU/RSV plus assay is intended as an aid in the diagnosis of influenza from Nasopharyngeal swab specimens and should not be used as a sole basis for treatment. Nasal washings and aspirates are unacceptable for Xpert Xpress SARS-CoV-2/FLU/RSV testing.  Fact Sheet for Patients: EntrepreneurPulse.com.au  Fact Sheet for Healthcare Providers: IncredibleEmployment.be  This test is not yet approved or cleared by the [place] FDA and has been authorized for detection and/or  diagnosis of SARS-CoV-2 by FDA under an Emergency Use Authorization (EUA). This EUA will remain in effect (meaning this test can be used) for the duration of the COVID-19 declaration under Section 564(b)(1) of the Act, 21 U.S.C. section 360bbb-3(b)(1), unless the authorization is  terminated or revoked.  Performed at [facility], [place] [address] [place], [place] [address]           Radiology Studies: CT Angio Head W or Wo Contrast  Result Date: 02/14/2021 CLINICAL DATA:  Sudden severe headache EXAM: CT ANGIOGRAPHY HEAD TECHNIQUE: Multidetector CT imaging of the head was performed using the standard protocol during bolus administration of intravenous contrast. Multiplanar CT image reconstructions and MIPs were obtained to evaluate the vascular anatomy. CONTRAST:  13mL OMNIPAQUE IOHEXOL 350 MG/ML SOLN COMPARISON:  Head CT 02/13/2021 FINDINGS: POSTERIOR CIRCULATION: --Vertebral arteries: Right V4 segment is non-opacified. Normal left. --Inferior cerebellar arteries: Normal. --Basilar artery: Normal. --Superior cerebellar arteries: Normal. --Posterior cerebral arteries: Moderate narrowing at the distal right P3 segment. No occlusion or stenosis of the left PCA. ANTERIOR CIRCULATION: --Intracranial internal carotid arteries: Normal. --Anterior cerebral arteries (ACA): There is moderate stenosis of the proximal left A2 segment. No other stenosis or occlusion. --Middle cerebral arteries (MCA): Normal. ANATOMIC VARIANTS: None Review of the MIP images confirms the above findings. IMPRESSION: 1. No intracranial arterial occlusion or high-grade stenosis. 2. Occlusion of the right vertebral artery V4 segment. 3. Moderate narrowing of the distal right P3 segment. 4. Moderate stenosis of the proximal left A2 segment. Electronically Signed   By: Ulyses Jarred M.D.   On: 02/14/2021 02:34   CT Head Wo Contrast  Result Date: 02/13/2021 CLINICAL DATA:  Loss of consciousness EXAM: CT HEAD WITHOUT  CONTRAST TECHNIQUE: Contiguous axial images were obtained from the base of the skull through the vertex without intravenous contrast. COMPARISON:  None. FINDINGS: Brain: There is no mass, hemorrhage or extra-axial collection. There is generalized atrophy without lobar predilection. Hypodensity of the white matter is most commonly associated with chronic microvascular disease. Small calcified meningioma in the left posterior fossa. Vascular: Atherosclerotic calcification of the internal carotid arteries at the skull base. No abnormal hyperdensity of the major intracranial arteries or dural venous sinuses. Skull: The visualized skull base, calvarium and extracranial soft tissues are normal. Sinuses/Orbits: No fluid levels or advanced mucosal thickening of the visualized paranasal sinuses. No mastoid or middle ear effusion. The orbits are normal. IMPRESSION: Generalized atrophy and chronic microvascular ischemia without acute intracranial abnormality. Electronically Signed   By: Ulyses Jarred M.D.   On: 02/13/2021 23:45   CT Angio Chest Pulmonary Embolism (PE) W or WO Contrast  Result Date: 02/14/2021 CLINICAL DATA:  Pulmonary embolism (PE) suspected, high prob EXAM: CT ANGIOGRAPHY CHEST WITH CONTRAST TECHNIQUE: Multidetector CT imaging of the chest was performed using the standard protocol during bolus administration of intravenous contrast. Multiplanar CT image reconstructions and MIPs were obtained to evaluate the vascular anatomy. CONTRAST:  28mL OMNIPAQUE IOHEXOL 350 MG/ML SOLN COMPARISON:  None. FINDINGS: Cardiovascular: Satisfactory opacification of the pulmonary arteries to the segmental level. No evidence of pulmonary embolism. Normal heart size. No pericardial effusion. Mediastinum/Nodes: Hiatal hernia. Soft tissue at the right aspect of the hernia. Representative measurement of 2.4 cm on series 5, image 72. Lungs/Pleura: Bibasilar atelectasis/scarring. No pleural effusion or pneumothorax. Upper Abdomen:  No acute abnormality. Musculoskeletal: Levoscoliosis distorting the thorax. Chronic appearing compression fractures. Review of the MIP images confirms the above findings. IMPRESSION: No evidence of acute pulmonary embolism or other acute abnormality. Hiatal hernia. There is soft tissue at the right aspect of the hernia likely reflecting adenopathy and suspicious for malignancy. Electronically Signed   By: Macy Mis M.D.   On: 02/14/2021 08:10   MR BRAIN WO CONTRAST  Result Date:  02/14/2021 CLINICAL DATA:  Delirium EXAM: MRI HEAD WITHOUT CONTRAST TECHNIQUE: Multiplanar, multiecho pulse sequences of the brain and surrounding structures were obtained without intravenous contrast. COMPARISON:  Yesterday and CTA from yesterday FINDINGS: Brain: No acute infarction, hemorrhage, hydrocephalus, extra-axial collection or mass lesion. Advanced brain atrophy, especially at the temporal lobes, correlating well with history of Alzheimer's disease. Confluent chronic small vessel ischemia which is diffuse. Small remote left cerebellar infarction. Vascular: Occluded right vertebral artery with absent flow void, known from CTA yesterday. Skull and upper cervical spine: Normal marrow signal Sinuses/Orbits: Bilateral cataract resection. Partial right mastoid opacification. IMPRESSION: 1. No emergent or reversible finding. 2. Advanced brain atrophy and chronic small vessel ischemia. Electronically Signed   By: Jorje Guild M.D.   On: 02/14/2021 11:52   DG Chest Portable 1 View  Result Date: 02/14/2021 CLINICAL DATA:  Unresponsiveness. EXAM: PORTABLE CHEST 1 VIEW COMPARISON:  Chest radiograph dated 02/02/2020 FINDINGS: Diffuse interstitial prominence. No focal consolidation, pleural effusion or pneumothorax. Cardiomegaly. Atherosclerotic calcification of the aorta. No acute osseous pathology. IMPRESSION: 1. No acute cardiopulmonary process. 2. Cardiomegaly. Electronically Signed   By: Anner Crete M.D.   On:  02/14/2021 02:44   EEG adult  Result Date: 02/14/2021 Lora Havens, MD     02/14/2021 10:22 AM Patient Name: [name] MRN: [id number] Epilepsy Attending: Lora Havens Referring Physician/Provider: Dr Niel Hummer Date: 02/14/2021 Duration: 21.35 mins Patient history: 87 year old female with altered mental status.  EEG to evaluate for seizure. Level of alertness: Awake AEDs during EEG study: None Technical aspects: This EEG study was done with scalp electrodes positioned according to the 10-20 International system of electrode placement. Electrical activity was acquired at a sampling rate of 500Hz  and reviewed with a high frequency filter of 70Hz  and a low frequency filter of 1Hz . EEG data were recorded continuously and digitally stored. Description: The posterior dominant rhythm consists of 9 Hz activity of moderate voltage (25-35 uV) seen predominantly in posterior head regions, symmetric and reactive to eye opening and eye closing. EEG showed intermittent generalized polymorphic sharply contoured 5 to 6 Hz theta slowing with overriding 15 to 18 Hz generalized beta activity. Hyperventilation and photic stimulation were not performed.   ABNORMALITY - Intermittent slow, generalized IMPRESSION: This study is suggestive of mild diffuse encephalopathy, nonspecific etiology. No seizures or epileptiform discharges were seen throughout the recording. Lora Havens   ECHOCARDIOGRAM COMPLETE  Result Date: 02/14/2021    ECHOCARDIOGRAM REPORT   Patient Name:   [name] Date of Exam: 02/14/2021 Medical Rec #:  [id number]     Height:       64.0 in Accession #:    [id number]    Weight:       138.9 lb Date of Birth:  [birth year]     BSA:          1.676 m Patient Age:    68 years      BP:           126/77 mmHg Patient Gender: F             HR:           84 bpm. Exam Location:  Inpatient Procedure: 2D Echo, Cardiac Doppler and Color Doppler Indications:    Stroke  History:        Patient has prior  history of Echocardiogram examinations, most                 recent 11/13/2016. Signs/Symptoms:Altered  Mental Status.  Sonographer:    Merrie Roof RDCS Referring Phys: [id number] [facility] A Birdia Jaycox  Sonographer Comments: Image acquisition challenging due to patient behavioral factors. The patient refused to continue the echo. IMPRESSIONS  1. Limited images obtained due to uncooperative patient.  2. Left ventricular ejection fraction, by estimation, is 60 to 65%. The left ventricle has normal function. The left ventricle has no regional wall motion abnormalities. Left ventricular diastolic parameters are consistent with Grade I diastolic dysfunction (impaired relaxation).  3. Right ventricular systolic function is normal. The right ventricular size is normal.  4. The mitral valve is grossly normal. No evidence of mitral valve regurgitation. No evidence of mitral stenosis.  5. The aortic valve is grossly normal. Aortic valve regurgitation is not visualized. No aortic stenosis is present. FINDINGS  Left Ventricle: Left ventricular ejection fraction, by estimation, is 60 to 65%. The left ventricle has normal function. The left ventricle has no regional wall motion abnormalities. The left ventricular internal cavity size was normal in size. There is  no left ventricular hypertrophy. Left ventricular diastolic parameters are consistent with Grade I diastolic dysfunction (impaired relaxation). Normal left ventricular filling pressure. Right Ventricle: The right ventricular size is normal. No increase in right ventricular wall thickness. Right ventricular systolic function is normal. Left Atrium: Left atrial size was normal in size. Right Atrium: Right atrial size was normal in size. Pericardium: There is no evidence of pericardial effusion. Mitral Valve: The mitral valve is grossly normal. No evidence of mitral valve regurgitation. No evidence of mitral valve stenosis. Tricuspid Valve: The tricuspid valve is grossly normal.  Tricuspid valve regurgitation is not demonstrated. Aortic Valve: The aortic valve is grossly normal. Aortic valve regurgitation is not visualized. No aortic stenosis is present. Pulmonic Valve: The pulmonic valve was not assessed. Pulmonic valve regurgitation is not visualized. No evidence of pulmonic stenosis. Aorta: Aortic root could not be assessed. IAS/Shunts: No atrial level shunt detected by color flow Doppler.   Diastology LV e' medial:    8.93 cm/s LV E/e' medial:  5.8 LV e' lateral:   9.60 cm/s LV E/e' lateral: 5.4  MITRAL VALVE MV Area (PHT): 3.89 cm MV Decel Time: 195 msec MV E velocity: 51.50 cm/s MV A velocity: 108.00 cm/s MV E/A ratio:  0.48 Skeet Latch MD Electronically signed by Skeet Latch MD Signature Date/Time: 02/14/2021/10:37:04 PM    Final         Scheduled Meds:  amLODipine  5 mg Oral Daily   buPROPion  150 mg Oral Daily   heparin  5,000 Units Subcutaneous Q8H   levothyroxine  75 mcg Oral Daily   memantine  10 mg Oral BID   Continuous Infusions:  cefTRIAXone (ROCEPHIN)  IV 1 g (02/14/21 2124)   dextrose 5% lactated ringers 75 mL/hr at 02/14/21 0923     LOS: 1 day    Time spent: 35 minutes.    Elmarie Shiley, MD [facility]   If 7PM-7AM, please contact night-coverage [email or web address]  02/15/2021, 1:41 PM

## 2021-02-15 NOTE — Evaluation (Signed)
Clinical/Bedside Swallow Evaluation Patient Details  Name: [name] MRN: [id number] Date of Birth: [birth year]  Today's Date: 02/15/2021 Time: SLP Start Time (ACUTE ONLY): 76 SLP Stop Time (ACUTE ONLY): 20 SLP Time Calculation (min) (ACUTE ONLY): 23 min  Past Medical History:  Past Medical History:  Diagnosis Date   Abnormality of gait    Alzheimer's disease ([place])    Calculus of kidney    Closed fracture of seventh cervical vertebra without mention of spinal cord injury    COVID-19 06/2019   Depressive disorder, not elsewhere classified    Edema    Edema    External hemorrhoids without mention of complication    Gastric ulcer, unspecified as acute or chronic, without mention of hemorrhage, perforation, or obstruction    GERD (gastroesophageal reflux disease)    Hypertension    Insomnia, unspecified    Loss of weight    Osteoarthrosis, unspecified whether generalized or localized, unspecified site    Other protein-calorie malnutrition    Pain in thoracic spine    Personal history of fall    Unspecified constipation    Unspecified hereditary and idiopathic peripheral neuropathy    Unspecified hypothyroidism    Unspecified vitamin D deficiency    Urinary frequency    Past Surgical History:  Past Surgical History:  Procedure Laterality Date   TEE WITHOUT CARDIOVERSION N/A 11/13/2016   Procedure: TRANSESOPHAGEAL ECHOCARDIOGRAM (TEE);  Surgeon: Jerline Pain, MD;  Location: [facility] END[facility]COPY;  Service: Cardiovascular;  Laterality: N/A;   WRIST SURGERY Right 2013   HPI:  87 y.o. female with medical history significant for Alzheimer's dementia, hypertension, hypothyroidism presents by EMS with respiratory failure decreased responsiveness.  Daughter is at bedside.  Reportedly patient was in her normal state of health and was watching a movie before going to bed with her caregiver when she suddenly became unresponsive around 10 PM last night.  She has home health aides 24 hours a  day and daughter has cameras and can check on her mother frequently.  Reportedly the aide stated that she had slumped over which the daughter confirmed by watching the video from the in-house camera and 911 was called.  The aide started CPR per 911's instructions even though patient had a DNR form but the aide did not mention that to 911 reportedly.  When EMS arrived patient had significant respiratory depression and was started on bag-valve-mask to aid respiration.  After arriving in the emergency room patient was noted to be apneic with no spontaneous respirations and the family requested the ER doctor continue bag valve mask therapy until family could get there in 5 or 10 minutes.  When family arrived patient spontaneously started breathing on her own and over the next few hours slowly had periods where she would move her extremities or open her eyes and occasionally answer question with single word answer.   She has not had any recent illness or fever and there was no complaint of any seizure-like activity, chest pain, vomiting, diarrhea or cough.  Respiratory depression occurred when patient was watching a movie was not eating and had no episodes of choking or gagging.  Lives at home with home health aides and family checks on her daily CXR on 02/13/21 indicated no acute issues.  Pt with reported coughing with liquids generating BSE.   Assessment / Plan / Recommendation  Clinical Impression  Pt seen for clinical swallowing evaluation with cognitive-based dysphagia noted characterized by oral holding, decreased bolus preparation, delay in the initiation  of the swallow and mod-max cues provided to generate swallow and prepare for transition into pharynx.  Pt with intermittent lethargy during BSE and limited oral care provided d/t pt participation despite encouragement.  Pt with multiple swallows with cup sips of thin, but no overt s/s of aspiration noted with tsp amounts and ice chips.  Pt unable to consume  thin via straw, but suspect she is at high risk for aspiration with a straw at current time d/t decreased mentation and lethargy.  Puree and solid consistency expelled from oral cavity by pt.  No family/caregiver present to determine baseline level diet consumed at home.  Recommend continue NPO status d/t lethargy, cognitive impairment with medication provided crushed in puree and ice chips allowed for comfort.  ST will f/u in acute setting for PO readiness.  Thank you for this consult. SLP Visit Diagnosis: Dysphagia, unspecified (R13.10)    Aspiration Risk  Mild aspiration risk;Risk for inadequate nutrition/hydration    Diet Recommendation   NPO  Medication Administration: Crushed with puree    Other  Recommendations Oral Care Recommendations: Oral care QID;Staff/trained caregiver to provide oral care    Recommendations for follow up therapy are one component of a multi-disciplinary discharge planning process, led by the attending physician.  Recommendations may be updated based on patient status, additional functional criteria and insurance authorization.  Follow up Recommendations Follow physician's recommendations for discharge plan and follow up therapies      Assistance Recommended at Discharge Frequent or constant Supervision/Assistance  Functional Status Assessment Patient has had a recent decline in their functional status and demonstrates the ability to make significant improvements in function in a reasonable and predictable amount of time.  Frequency and Duration min 2x/week  1 week       Prognosis Prognosis for Safe Diet Advancement: Fair Barriers to Reach Goals: Cognitive deficits      Swallow Study   General Date of Onset: 02/13/21 HPI: 87 y.o. female with medical history significant for Alzheimer's dementia, hypertension, hypothyroidism presents by EMS with respiratory failure decreased responsiveness.  Daughter is at bedside.  Reportedly patient was in her normal  state of health and was watching a movie before going to bed with her caregiver when she suddenly became unresponsive around 10 PM last night.  She has home health aides 24 hours a day and daughter has cameras and can check on her mother frequently.  Reportedly the aide stated that she had slumped over which the daughter confirmed by watching the video from the in-house camera and 911 was called.  The aide started CPR per 911's instructions even though patient had a DNR form but the aide did not mention that to 911 reportedly.  When EMS arrived patient had significant respiratory depression and was started on bag-valve-mask to aid respiration.  After arriving in the emergency room patient was noted to be apneic with no spontaneous respirations and the family requested the ER doctor continue bag valve mask therapy until family could get there in 5 or 10 minutes.  When family arrived patient spontaneously started breathing on her own and over the next few hours slowly had periods where she would move her extremities or open her eyes and occasionally answer question with single word answer.   She has not had any recent illness or fever and there was no complaint of any seizure-like activity, chest pain, vomiting, diarrhea or cough.  Respiratory depression occurred when patient was watching a movie was not eating and had no episodes  of choking or gagging.  Lives at home with home health aides and family checks on her daily Type of Study: Bedside Swallow Evaluation Previous Swallow Assessment: n/a Diet Prior to this Study: NPO Temperature Spikes Noted: No Respiratory Status: Nasal cannula History of Recent Intubation: No Behavior/Cognition: Lethargic/Drowsy;Impulsive;Requires cueing Oral Cavity Assessment: Dry Oral Care Completed by SLP: Yes (limited) Oral Cavity - Dentition: Adequate natural dentition Self-Feeding Abilities: Needs assist;Needs set up Patient Positioning: Upright in bed Baseline Vocal  Quality: Low vocal intensity Volitional Cough: Cognitively unable to elicit Volitional Swallow: Unable to elicit    Oral/Motor/Sensory Function Overall Oral Motor/Sensory Function: Other (comment) (DTA; grossly WFL)   Ice Chips Ice chips: Impaired Presentation: Spoon Oral Phase Functional Implications: Oral holding;Prolonged oral transit Pharyngeal Phase Impairments: Suspected delayed Swallow   Thin Liquid Thin Liquid: Impaired Presentation: Spoon;Cup Oral Phase Impairments: Reduced labial seal Oral Phase Functional Implications: Prolonged oral transit;Oral holding;Left anterior spillage Pharyngeal  Phase Impairments: Suspected delayed Swallow;Multiple swallows    Nectar Thick Nectar Thick Liquid: Not tested   Honey Thick Honey Thick Liquid: Not tested   Puree Puree: Impaired Presentation: Spoon Oral Phase Functional Implications: Oral holding;Other (comment) (pt expelled from oral cavity)   Solid     Solid: Impaired Presentation: Spoon Oral Phase Impairments: Impaired mastication;Reduced lingual movement/coordination Oral Phase Functional Implications: Oral holding;Prolonged oral transit;Impaired mastication Other Comments: Pt expelled from oral cavity      Elvina Sidle, M.S., CCC-SLP 02/15/2021,11:26 AM

## 2021-02-15 NOTE — Progress Notes (Signed)
°  Transition of Care [facility]) Screening Note   Patient Details  Name: [name] Date of Birth: 24-Sep-1931   Transition of Care [facility]) CM/SW Contact:    Benard Halsted, LCSW Phone Number: 02/15/2021, 8:29 AM    Transition of Care Department [facility]) has reviewed patient and no TOC needs have been identified at this time. We will continue to monitor patient advancement through interdisciplinary progression rounds. If new patient transition needs arise, please place a TOC consult.

## 2021-02-15 NOTE — Progress Notes (Signed)
Daily Progress Note   Patient Name: [name]       Date: 02/15/2021 DOB: 1931/08/20  Age: 87 y.o. MRN#: [id number] Attending Physician: Elmarie Shiley, MD Primary Care Physician: [name], NP Admit Date: 02/13/2021   HPI/Patient Profile: 87 y.o. female  with past medical history of Alzheimer's dementia, hypertension,and hypothyroidism who presented to the emergency department on 02/13/2021 with decreased responsiveness. Reportedly patient was in her usual state of health when she suddenly became unresponsive around 10pm last night. Her care aide notified her daughter who confirmed by video and then called 911. Per the dispatcher's instructions, the aide started CPR. When EMS arrived, patient had significant respiratory depression and was started on bag-valve-mask ventilation. After arriving to the ED, patient was noted to be apneic with no spontaneous respirations and the family requested the EDP to continue bag-valve-mask ventilation until the family could arrive. By the time family arrived, patient was breathing on her own. In the ED, labs revealed creatinine of 2.9 (baseline 1.43). CT head showed no acute intracranial process. MRI negative for acute finding, but showed advanced brain atrophy and chronic small vessel ischemia.  EEG negative for seizures and is suggestive of mild diffuse encephalopathy. Admitted to [facility] with sepsis and acute respiratory failure, secondary to UTI.   Subjective: Note that patient was seen by SLP earlier today but was too lethargic to fully participate in evaluation; recommended continuing NPO status for now.   I met with patient's daughter [name] at bedside. Dr. Tyrell Antonio is also at bedside. Discussion is had regarding patient's mental status and  concern for possible aspiration. Provided education that dysphagia is very common in patients with dementia, especially as the disease progresses. Daughter verbalizes understanding, but wants to at least try to feed her mother to "give her a chance". She understands not to attempt feeding unless patient is fully awake and alert.   Discussed in detail the concept of comfort feeds. Provided education that patients with known aspiration risk can still be fed as long as family understands and accepts this risk. Also discussed that patients do not desire food/drink as they approach end of life. However, if patient shows interest in food/drink, family can proceed to provide smalls bites and sites only when patient is fully awake and alert. Discussed that the benefit of comfort  from eating would need to outweigh the risk of aspiration in this situation.   Advanced directives, concepts specific to code status, artifical feeding and hydration, and rehospitalization were considered and discussed. Reviewed MOST form on file from August 2020. Daughter does not wish to make any changes at this time.    Cardiopulmonary Resuscitation: Do Not Attempt Resuscitation (DNR/No CPR)  Medical Interventions: Limited Additional Interventions: Use medical treatment, IV fluids and cardiac monitoring as indicated, DO NOT USE intubation or mechanical ventilation. May consider use of less invasive airway support such as BiPAP or CPAP. Also provide comfort measures. Transfer to the hospital if indicated. Avoid intensive care.   Antibiotics: Determine use of limitation of antibiotics when infection occurs  IV Fluids: IV fluids for a defined trial period  Feeding Tube: No feeding tube      Length of Stay: 1    Physical Exam Constitutional:      General: She is not in acute distress.    Comments: Frail appearing  Pulmonary:     Effort: Pulmonary effort is normal.  Neurological:     Mental Status: She is lethargic.      Motor: Weakness present.  Psychiatric:        Cognition and Memory: Cognition is impaired.            Vital Signs: BP (!) 129/116 (BP Location: Right Arm)    Pulse 87    Temp 98 F (36.7 C) (Oral)    Resp 15    Ht 5' 4"  (1.626 m)    Wt 63 kg    SpO2 95%    BMI 23.84 kg/m  SpO2: SpO2: 95 % O2 Device: O2 Device: Nasal Cannula O2 Flow Rate: O2 Flow Rate (L/min): 2 L/min        Palliative Assessment/Data: PPS 20%      Palliative Care Assessment & Plan   Assessment: - sepsis secondary to UTI - respiratory arrest - acute metabolic encephalopathy - dementia, advanced  Recommendations/Plan: Continue current supportive care Dysphagia 3 diet ordered for tonight until SLP can re-assess tomorrow Daughter will need ongoing education and support on aspiration precautions and comfort feeds PMT will continue to follow  Goals of Care and Additional Recommendations: Limitations on Scope of Treatment: No Artificial Feeding  Code Status: DNR/DNI   Prognosis:  < 6 months  Discharge Planning: Home with Hospice    Thank you for allowing the Palliative Medicine Team to assist in the care of this patient.   Total Time 65 minutes Prolonged Time Billed  yes       Greater than 50%  of this time was spent counseling and coordinating care related to the above assessment and plan.  Lavena Bullion, NP  Please contact Palliative Medicine Team phone at [phone] for questions and concerns.

## 2021-02-16 DIAGNOSIS — G9341 Metabolic encephalopathy: Secondary | ICD-10-CM

## 2021-02-16 LAB — BASIC METABOLIC PANEL
Anion gap: 8 (ref 5–15)
BUN: 9 mg/dL (ref 8–23)
CO2: 27 mmol/L (ref 22–32)
Calcium: 9.1 mg/dL (ref 8.9–10.3)
Chloride: 105 mmol/L (ref 98–111)
Creatinine, Ser: 1.13 mg/dL — ABNORMAL HIGH (ref 0.44–1.00)
GFR, Estimated: 47 mL/min — ABNORMAL LOW (ref >60)
Glucose, Bld: 104 mg/dL — ABNORMAL HIGH (ref 70–99)
Potassium: 3.3 mmol/L — ABNORMAL LOW (ref 3.5–5.1)
Sodium: 140 mmol/L (ref 135–145)

## 2021-02-16 LAB — CBC
HCT: 41.2 % (ref 36.0–46.0)
Hemoglobin: 14.1 g/dL (ref 12.0–15.0)
MCH: 34.7 pg — ABNORMAL HIGH (ref 26.0–34.0)
MCHC: 34.2 g/dL (ref 30.0–36.0)
MCV: 101.5 fL — ABNORMAL HIGH (ref 80.0–100.0)
Platelets: 223 10*3/uL (ref 150–400)
RBC: 4.06 MIL/uL (ref 3.87–5.11)
RDW: 12.7 % (ref 11.5–15.5)
WBC: 8.9 10*3/uL (ref 4.0–10.5)
nRBC: 0 % (ref 0.0–0.2)

## 2021-02-16 LAB — GLUCOSE, CAPILLARY
Glucose-Capillary: 96 mg/dL (ref 70–99)
Glucose-Capillary: 100 mg/dL — ABNORMAL HIGH (ref 70–99)
Glucose-Capillary: 108 mg/dL — ABNORMAL HIGH (ref 70–99)
Glucose-Capillary: 99 mg/dL (ref 70–99)
Glucose-Capillary: 103 mg/dL — ABNORMAL HIGH (ref 70–99)

## 2021-02-16 MED ORDER — POTASSIUM CHLORIDE 20 MEQ PO PACK
20.0000 meq | PACK | Freq: Once | ORAL | Status: AC
  Administered 2021-02-16: 20 meq via ORAL
  Filled 2021-02-16: qty 1

## 2021-02-16 MED ORDER — QUETIAPINE FUMARATE 25 MG PO TABS
25.0000 mg | ORAL_TABLET | Freq: Every day | ORAL | Status: DC | PRN
Start: 2021-02-16 — End: 2021-02-17
  Filled 2021-02-16: qty 1

## 2021-02-16 MED ORDER — POTASSIUM CHLORIDE 10 MEQ/100ML IV SOLN
10.0000 meq | INTRAVENOUS | Status: AC
  Administered 2021-02-16 (×3): 10 meq via INTRAVENOUS
  Filled 2021-02-16 (×3): qty 100

## 2021-02-16 MED ORDER — LORAZEPAM 2 MG/ML IJ SOLN
0.5000 mg | Freq: Once | INTRAMUSCULAR | Status: AC
Start: 2021-02-16 — End: 2021-02-16
  Administered 2021-02-16: 0.5 mg via INTRAVENOUS
  Filled 2021-02-16: qty 1

## 2021-02-16 NOTE — Progress Notes (Signed)
Patient is combative, pulling at lines and attempting to bite through her IV tubing. Safety Mitts are not working. Patient is chewing through them. Dr.  Velia Meyer notified . New order received for wrist retraints.

## 2021-02-16 NOTE — Progress Notes (Signed)
TR[facility] night cross cover note:  I was notified by RN that patient's IV infiltrated. IV access team consulted; IVF's being held until access can be reestablished.    Babs Bertin, DO Hospitalist

## 2021-02-16 NOTE — Progress Notes (Signed)
PROGRESS NOTE    [name]  EQA:[id number] DOB: 09/06/1931 DOA: 02/13/2021 PCP: [name], NP   Brief Narrative: 87 year old with past medical history significant for Alzheimer's dementia, hypertension, hypothyroidism who presents via EMS with respiratory failure, decreased responsiveness and respiratory distress.  Patient was in her normal state of health when she suddenly became unresponsive the night of admission.  The aide started CPR.  911 instruction even though patient had a DNR form but the aide did not mention that to 911.  When EMS arrived patient had significant respiratory depression and was a started on a bag-valve-mask to aid respiration.  After arriving in the emergency room patient was noted to be apneic with no spontaneous respiration and the family requested the ER doctor to continue bag-valve-mask therapy until family could get there in 5 or 10 minutes.  When family arrived patient  spontaneously started breathing on her own and over the next few hours slowly had periods where she will will move her extremity and upper her eyes and occasionally answer questions with single word answers.   Assessment & Plan:   Principal Problem:   Sepsis ([place]) Active Problems:   Hypothyroidism   Essential hypertension   Dementia with behavioral disturbance   UTI (urinary tract infection)   Respiratory failure with hypoxia (HCC)   Prolonged QT interval   CKD (chronic kidney disease), stage III (HCC)   1-Sepsis: secondary to UTI.  Patient presents with hypothermia,  respiratory failure, UA consistent with UTI. She was placed on  air Hugger.  Continue with IV ceftriaxone and IV fluids Urine culture growing E coli. Sensitive to ceftriaxone.   2-Respiratory arrest: Unclear etiology, suspect related to hypothermia and sepsis. MRI negative for stroke, CTA chest negative for PE. EEG negative for seizures ECHO; Normal EF Discussed with family, patient continued to be  DNR. Palliative care consulted  3-Acute Metabolic Encephalopathy; secondary to infection, resp arrest. And underline dementia.  Patient will benefit form palliative and hospice care.  Ammonia normal. B12 not low.  MRI negative for stroke.  Plan for home with Hospice care.  MS fluctuates.   Hypokalemia: Replete IV  4-Hypothyroidism: TSH elevated initially at 17.  Repeated TSH with now 3.4. Continue with current home meds dose.  HTN; on Norvasc.   CKD stage IIIB;  Monitor. Continue with IV fluids.   Prolong QT. Replete electrolytes.  Dementia; Continue with Namenda. Hold Aricept due to prolong QT  Estimated body mass index is 23.84 kg/m as calculated from the following:   Height as of this encounter: 5\' 4"  (1.626 m).   Weight as of this encounter: 63 kg.   DVT prophylaxis: Heparin  Code Status: DNR Family Communication: Daughter at bedside 12/31 Disposition Plan:  Status is: Inpatient  Remains inpatient appropriate because: AMS        Consultants:  Palliative Care  Procedures:  ECHO  Antimicrobials:    Subjective: She was sleepy, only said good morning.   Objective: Vitals:   02/15/21 2343 02/16/21 0339 02/16/21 0742 02/16/21 1227  BP: (!) 141/83 (!) 144/82 (!) 119/57 (!) 141/85  Pulse: 80 72 60 75  Resp: 15 16 14 17   Temp: 98.7 F (37.1 C) 98.4 F (36.9 C) 98.4 F (36.9 C) 99 F (37.2 C)  TempSrc: Oral Oral Oral Oral  SpO2: 98% 98% 96% 99%  Weight:      Height:        Intake/Output Summary (Last 24 hours) at 02/16/2021 1246 Last data filed at  02/16/2021 0500 Gross per 24 hour  Intake 437.91 ml  Output 550 ml  Net -112.09 ml    Filed Weights   02/13/21 2316  Weight: 63 kg    Examination:  General exam: Sleepy.  Respiratory system: CTA Cardiovascular system: S 1, S 2 RRR Gastrointestinal system: BS present, NT, ND Central nervous system: Sleepy Extremities: No edema    Data Reviewed: I have personally reviewed following labs  and imaging studies  CBC: Recent Labs  Lab 02/13/21 2305 02/13/21 2334 02/14/21 0446 02/15/21 0052 02/16/21 0908  WBC 11.8*  --  9.4 8.9 8.9  NEUTROABS 7.4  --   --   --   --   HGB 14.5 14.3 13.6 14.2 14.1  HCT 43.3 42.0 40.8 42.6 41.2  MCV 104.6*  --  104.1* 102.7* 101.5*  PLT 204  --  173 147* 400    Basic Metabolic Panel: Recent Labs  Lab 02/13/21 2305 02/13/21 2334 02/14/21 0446 02/14/21 0616 02/15/21 0052 02/16/21 0908  NA 139 142 135  --  135 140  K 2.9* 2.9* 3.6  --  2.9* 3.3*  CL 106  --  103  --  102 105  CO2 22  --  24  --  20* 27  GLUCOSE 162*  --  101*  --  83 104*  BUN 19  --  15  --  8 9  CREATININE 1.43*  --  1.10*  --  0.98 1.13*  CALCIUM 8.9  --  8.1*  --  8.4* 9.1  MG  --   --   --  2.3  --   --     GFR: Estimated Creatinine Clearance: 30.3 mL/min (A) (by C-G formula based on SCr of 1.13 mg/dL (H)). Liver Function Tests: Recent Labs  Lab 02/13/21 2305  AST 17  ALT 12  ALKPHOS 75  BILITOT 0.8  PROT 6.4*  ALBUMIN 3.8    No results for input(s): LIPASE, AMYLASE in the last 168 hours. Recent Labs  Lab 02/13/21 2305 02/14/21 1645  AMMONIA 21 30    Coagulation Profile: No results for input(s): INR, PROTIME in the last 168 hours. Cardiac Enzymes: No results for input(s): CKTOTAL, CKMB, CKMBINDEX, TROPONINI in the last 168 hours. BNP (last 3 results) No results for input(s): PROBNP in the last 8760 hours. HbA1C: No results for input(s): HGBA1C in the last 72 hours. CBG: Recent Labs  Lab 02/15/21 1136 02/15/21 2022 02/16/21 0108 02/16/21 0747 02/16/21 1208  GLUCAP 99 122* 96 100* 108*    Lipid Profile: No results for input(s): CHOL, HDL, LDLCALC, TRIG, CHOLHDL, LDLDIRECT in the last 72 hours. Thyroid Function Tests: Recent Labs    02/14/21 0928  TSH 3.476  FREET4 1.21*  T3FREE 1.8*    Anemia Panel: Recent Labs    02/15/21 0052  VITAMINB12 1,890*    Sepsis Labs: Recent Labs  Lab 02/13/21 2305 02/14/21 0445  02/14/21 0616  LATICACIDVEN 2.4* 1.5 1.4     Recent Results (from the past 240 hour(s))  Urine Culture     Status: Abnormal (Preliminary result)   Collection Time: 02/13/21  2:37 PM   Specimen: In/Out Cath Urine  Result Value Ref Range Status   Specimen Description IN/OUT CATH URINE  Final   Special Requests NONE  Final   Culture (A)  Final    >=100,000 COLONIES/mL ESCHERICHIA COLI CULTURE REINCUBATED FOR BETTER GROWTH Performed at [facility] [address] [place], [place] [address]  Report Status PENDING  Incomplete   Organism ID, Bacteria ESCHERICHIA COLI (A)  Final      Susceptibility   Escherichia coli - MIC*    AMPICILLIN 8 SENSITIVE Sensitive     CEFAZOLIN <=4 SENSITIVE Sensitive     CEFEPIME <=0.12 SENSITIVE Sensitive     CEFTRIAXONE <=0.25 SENSITIVE Sensitive     CIPROFLOXACIN <=0.25 SENSITIVE Sensitive     GENTAMICIN <=1 SENSITIVE Sensitive     IMIPENEM <=0.25 SENSITIVE Sensitive     NITROFURANTOIN <=16 SENSITIVE Sensitive     TRIMETH/SULFA <=20 SENSITIVE Sensitive     AMPICILLIN/SULBACTAM 4 SENSITIVE Sensitive     PIP/TAZO <=4 SENSITIVE Sensitive     * >=100,000 COLONIES/mL ESCHERICHIA COLI  Blood Cultures (routine x 2)     Status: None (Preliminary result)   Collection Time: 02/13/21 11:10 PM   Specimen: BLOOD RIGHT HAND  Result Value Ref Range Status   Specimen Description BLOOD RIGHT HAND  Final   Special Requests   Final    BOTTLES DRAWN AEROBIC AND ANAEROBIC Blood Culture results may not be optimal due to an inadequate volume of blood received in culture bottles   Culture   Final    NO GROWTH 2 DAYS Performed at [facility], [place] [address] [place], [place] [address]    Report Status PENDING  Incomplete  Blood Cultures (routine x 2)     Status: None (Preliminary result)   Collection Time: 02/13/21 11:55 PM   Specimen: BLOOD  Result Value Ref Range Status   Specimen Description BLOOD RIGHT ANTECUBITAL  Final   Special Requests    Final    AEROBIC BOTTLE ONLY Blood Culture results may not be optimal due to an inadequate volume of blood received in culture bottles   Culture   Final    NO GROWTH 2 DAYS Performed at [facility], [place] [address], [place], [place] [address]    Report Status PENDING  Incomplete  Resp Panel by RT-PCR (Flu A&B, Covid) Nasopharyngeal Swab     Status: None   Collection Time: 02/14/21  3:16 AM   Specimen: Nasopharyngeal Swab; Nasopharyngeal(NP) swabs in vial transport medium  Result Value Ref Range Status   SARS Coronavirus 2 by RT PCR NEGATIVE NEGATIVE Final    Comment: (NOTE) SARS-CoV-2 target nucleic acids are NOT DETECTED.  The SARS-CoV-2 RNA is generally detectable in upper respiratory specimens during the acute phase of infection. The lowest concentration of SARS-CoV-2 viral copies this assay can detect is 138 copies/mL. A negative result does not preclude SARS-Cov-2 infection and should not be used as the sole basis for treatment or other patient management decisions. A negative result may occur with  improper specimen collection/handling, submission of specimen other than nasopharyngeal swab, presence of viral mutation(s) within the areas targeted by this assay, and inadequate number of viral copies(<138 copies/mL). A negative result must be combined with clinical observations, patient history, and epidemiological information. The expected result is Negative.  Fact Sheet for Patients:  EntrepreneurPulse.com.au  Fact Sheet for Healthcare Providers:  IncredibleEmployment.be  This test is no t yet approved or cleared by the [place] FDA and  has been authorized for detection and/or diagnosis of SARS-CoV-2 by FDA under an Emergency Use Authorization (EUA). This EUA will remain  in effect (meaning this test can be used) for the duration of the COVID-19 declaration under Section 564(b)(1) of the Act, 21 U.S.C.section 360bbb-3(b)(1),  unless the authorization is terminated  or revoked sooner.  Influenza A by PCR NEGATIVE NEGATIVE Final   Influenza B by PCR NEGATIVE NEGATIVE Final    Comment: (NOTE) The Xpert Xpress SARS-CoV-2/FLU/RSV plus assay is intended as an aid in the diagnosis of influenza from Nasopharyngeal swab specimens and should not be used as a sole basis for treatment. Nasal washings and aspirates are unacceptable for Xpert Xpress SARS-CoV-2/FLU/RSV testing.  Fact Sheet for Patients: EntrepreneurPulse.com.au  Fact Sheet for Healthcare Providers: IncredibleEmployment.be  This test is not yet approved or cleared by the [place] FDA and has been authorized for detection and/or diagnosis of SARS-CoV-2 by FDA under an Emergency Use Authorization (EUA). This EUA will remain in effect (meaning this test can be used) for the duration of the COVID-19 declaration under Section 564(b)(1) of the Act, 21 U.S.C. section 360bbb-3(b)(1), unless the authorization is terminated or revoked.  Performed at [facility], [place] [address], [place], [place] [address]           Radiology Studies: ECHOCARDIOGRAM COMPLETE  Result Date: 02/14/2021    ECHOCARDIOGRAM REPORT   Patient Name:   [name] Date of Exam: 02/14/2021 Medical Rec #:  [id number]     Height:       64.0 in Accession #:    [id number]    Weight:       138.9 lb Date of Birth:  30-Apr-1931     BSA:          1.676 m Patient Age:    76 years      BP:           126/77 mmHg Patient Gender: F             HR:           84 bpm. Exam Location:  Inpatient Procedure: 2D Echo, Cardiac Doppler and Color Doppler Indications:    Stroke  History:        Patient has prior history of Echocardiogram examinations, most                 recent 11/13/2016. Signs/Symptoms:Altered Mental Status.  Sonographer:    Merrie Roof RDCS Referring Phys: [id number] [facility] A Alicja Everitt  Sonographer Comments: Image acquisition challenging due to  patient behavioral factors. The patient refused to continue the echo. IMPRESSIONS  1. Limited images obtained due to uncooperative patient.  2. Left ventricular ejection fraction, by estimation, is 60 to 65%. The left ventricle has normal function. The left ventricle has no regional wall motion abnormalities. Left ventricular diastolic parameters are consistent with Grade I diastolic dysfunction (impaired relaxation).  3. Right ventricular systolic function is normal. The right ventricular size is normal.  4. The mitral valve is grossly normal. No evidence of mitral valve regurgitation. No evidence of mitral stenosis.  5. The aortic valve is grossly normal. Aortic valve regurgitation is not visualized. No aortic stenosis is present. FINDINGS  Left Ventricle: Left ventricular ejection fraction, by estimation, is 60 to 65%. The left ventricle has normal function. The left ventricle has no regional wall motion abnormalities. The left ventricular internal cavity size was normal in size. There is  no left ventricular hypertrophy. Left ventricular diastolic parameters are consistent with Grade I diastolic dysfunction (impaired relaxation). Normal left ventricular filling pressure. Right Ventricle: The right ventricular size is normal. No increase in right ventricular wall thickness. Right ventricular systolic function is normal. Left Atrium: Left atrial size was normal in size. Right Atrium: Right atrial size was normal in size. Pericardium: There is  no evidence of pericardial effusion. Mitral Valve: The mitral valve is grossly normal. No evidence of mitral valve regurgitation. No evidence of mitral valve stenosis. Tricuspid Valve: The tricuspid valve is grossly normal. Tricuspid valve regurgitation is not demonstrated. Aortic Valve: The aortic valve is grossly normal. Aortic valve regurgitation is not visualized. No aortic stenosis is present. Pulmonic Valve: The pulmonic valve was not assessed. Pulmonic valve  regurgitation is not visualized. No evidence of pulmonic stenosis. Aorta: Aortic root could not be assessed. IAS/Shunts: No atrial level shunt detected by color flow Doppler.   Diastology LV e' medial:    8.93 cm/s LV E/e' medial:  5.8 LV e' lateral:   9.60 cm/s LV E/e' lateral: 5.4  MITRAL VALVE MV Area (PHT): 3.89 cm MV Decel Time: 195 msec MV E velocity: 51.50 cm/s MV A velocity: 108.00 cm/s MV E/A ratio:  0.48 Skeet Latch MD Electronically signed by Skeet Latch MD Signature Date/Time: 02/14/2021/10:37:04 PM    Final         Scheduled Meds:  amLODipine  5 mg Oral Daily   buPROPion  150 mg Oral Daily   heparin  5,000 Units Subcutaneous Q8H   levothyroxine  75 mcg Oral Daily   memantine  10 mg Oral BID   Continuous Infusions:  cefTRIAXone (ROCEPHIN)  IV 1 g (02/15/21 2142)   dextrose 5% lactated ringers 75 mL/hr at 02/16/21 0105     LOS: 2 days    Time spent: 35 minutes.    Elmarie Shiley, MD [facility]   If 7PM-7AM, please contact night-coverage [email or web address]  02/16/2021, 12:46 PM

## 2021-02-16 NOTE — Progress Notes (Signed)
TRH night cross cover note:  I was notified by RN that the patient is combative, pulling at her IV lines, attempting to bite through her associated IV tubing, with these actions refractory to attempts at verbal redirection.  Mitts ineffective, with the patient also attempting to chew through this hardware.  Consequently, I have placed orders for initiation of soft bilateral wrist restraints.     Babs Bertin, DO Hospitalist

## 2021-02-16 NOTE — Progress Notes (Addendum)
[facility] [address] [facility]) Hospital Liaison Note  Chart and patient information reviewed by [facility] physician. Hospice eligibility confirmed. DME to be delivered at 10 am on 1.1.23.  Please send signed and completed DNR home with patient/family. Please provide prescriptions at discharge as needed to ensure ongoing symptom management.   ACC information and contact numbers given to family. Above information shared with [place].   Please do not hesitate to call with any hospice related questions or concerns.   Thank you for the opportunity to participate in this patient's care.   Nadene Rubins, RN, BSN [facility] Hospital Liaison [phone]

## 2021-02-16 NOTE — Progress Notes (Addendum)
Daily Progress Note   Patient Name: [name]       Date: 02/16/2021 DOB: 1931-08-27  Age: 87 y.o. MRN#: [id number] Attending Physician: Elmarie Shiley, MD Primary Care Physician: [name], NP Admit Date: 02/13/2021    HPI/Patient Profile: 87 y.o. female  with past medical history of Alzheimer's dementia, hypertension,and hypothyroidism who presented to the emergency department on 02/13/2021 with decreased responsiveness. Reportedly patient was in her usual state of health when she suddenly became unresponsive around 10pm last night. Her care aide notified her daughter who confirmed by video and then called 911. Per the dispatcher's instructions, the aide started CPR. When EMS arrived, patient had significant respiratory depression and was started on bag-valve-mask ventilation. After arriving to the ED, patient was noted to be apneic with no spontaneous respirations and the family requested the EDP to continue bag-valve-mask ventilation until the family could arrive. By the time family arrived, patient was breathing on her own. In the ED, labs revealed creatinine of 2.9 (baseline 1.43). CT head showed no acute intracranial process. MRI negative for acute finding, but showed advanced brain atrophy and chronic small vessel ischemia.  EEG negative for seizures and is suggestive of mild diffuse encephalopathy. Admitted to [facility] with sepsis and acute respiratory failure, secondary to UTI.   Subjective: I spoke with daughter/Jean by phone. I let her know that patient had been reassessed by SLP earlier this morning. Patient was stimulated awake and then per SLP, she "accepted several sips of ice water from a straw, and while there was some oral holding/delay, she did initiate a palpable  swallow response and presented with NO s/sx of aspiration".  I did emphasize to daughter that per SLP, patient "required max cues and encouragement to participate, and after several sips of water she became irritable and yelled that she did not want any more".   Discussed that patient continues to be lethargic and will likely not return to her previous baseline.   Discussed that patient had been officially deemed eligible for hospice services at home. Reviewed that hospice is for a patient with end-stage illness, and is about supporting the patient where they are while allowing the natural course to occur. Discussed that family will need this support in the setting of patient's recent decline and decreased oral intake. Daughter agrees  that the addition of hospice support will be beneficial for both patient and family. She requests that patient not be discharged until hospital bed is delivered to the house (hopefully tomorrow).    Length of Stay: 2  Physical Exam Vitals reviewed.  Constitutional:      General: She is sleeping. She is not in acute distress.    Appearance: She is ill-appearing.     Comments: Frail  Pulmonary:     Effort: Pulmonary effort is normal.  Neurological:     Mental Status: She is lethargic.     Motor: Weakness present.  Psychiatric:        Cognition and Memory: Cognition is impaired.            Vital Signs: BP (!) 141/85 (BP Location: Right Arm)    Pulse 75    Temp 99 F (37.2 C) (Oral)    Resp 17    Ht 5\' 4"  (1.626 m)    Wt 63 kg    SpO2 99%    BMI 23.84 kg/m  SpO2: SpO2: 99 % O2 Device: O2 Device: Nasal Cannula O2 Flow Rate: O2 Flow Rate (L/min): 2 L/min    Palliative Assessment/Data: PPS 20%      Palliative Care Assessment & Plan   Assessment: - sepsis secondary to UTI - respiratory arrest/failure, resolved - acute metabolic encephalopathy - dementia, advanced - decreased oral intake   Recommendations/Plan: Continue current supportive  care Appreciate SLP support - continue dysphagia 3 diet Daughter will need ongoing discussion and support on comfort feeding Plan for home with hospice after bed is delivered   Goals of Care and Additional Recommendations: Limitations on Scope of Treatment: No Artificial Feeding  Code Status: DNR/DNI  Prognosis:  < 6 months  Discharge Planning: Home with Hospice  Care plan was discussed with Dr. Tyrell Antonio  Thank you for allowing the Palliative Medicine Team to assist in the care of this patient.   Total Time 35 minutes Prolonged Time Billed  no       Greater than 50%  of this time was spent counseling and coordinating care related to the above assessment and plan.  Lavena Bullion, NP  Please contact Palliative Medicine Team phone at [phone] for questions and concerns.

## 2021-02-16 NOTE — Progress Notes (Signed)
Speech Language Pathology Treatment: Dysphagia  Patient Details Name: [name] MRN: [id number] DOB: Apr 24, 1931 Today's Date: 02/16/2021 Time: 4315-4008 SLP Time Calculation (min) (ACUTE ONLY): 16 min  Assessment / Plan / Recommendation Clinical Impression  F/u after yesterday's swallow assessment. Palliative care met with family subsequently, and pt was placed on a dysphagia 3 diet pending our f/u today.  [name] was sleeping upon entering room; turned on lights and washed face with cool washcloth.  She alerted, opened eyes, was speaking intermittently but it was not intelligible.  She was repositioned to upright but had a preference to curl up on left side - HOB was elevated in this position. She accepted several sips of ice water from a straw, and while there was some oral holding/delay, she did initiate a palpable swallow response and presented with NO s/sx of aspiration. She required max cues and encouragement to participate, and after several sips of water she became irritable and yelled that she did not want any more.  Session was ended and HOB reclined so that she could resume sleeping.  No family present.  Recommend continuing to offer POs to [name] - she did not appear to be aspirating at this time, however, with advancing dementia, her swallowing will wax and wane.   SLP will follow briefly.     HPI HPI: 87 y.o. female with medical history significant for Alzheimer's dementia, hypertension, hypothyroidism presents by EMS with respiratory failure decreased responsiveness.  Daughter is at bedside.  Reportedly patient was in her normal state of health and was watching a movie before going to bed with her caregiver when she suddenly became unresponsive around 10 PM last night.  She has home health aides 24 hours a day and daughter has cameras and can check on her mother frequently.  Reportedly the aide stated that she had slumped over which the daughter confirmed by watching the video  from the in-house camera and 911 was called.  The aide started CPR per 911's instructions even though patient had a DNR form but the aide did not mention that to 911 reportedly.  When EMS arrived patient had significant respiratory depression and was started on bag-valve-mask to aid respiration.  After arriving in the emergency room patient was noted to be apneic with no spontaneous respirations and the family requested the ER doctor continue bag valve mask therapy until family could get there in 5 or 10 minutes.  When family arrived patient spontaneously started breathing on her own and over the next few hours slowly had periods where she would move her extremities or open her eyes and occasionally answer question with single word answer.   She has not had any recent illness or fever and there was no complaint of any seizure-like activity, chest pain, vomiting, diarrhea or cough.  Respiratory depression occurred when patient was watching a movie was not eating and had no episodes of choking or gagging.  Lives at home with home health aides and family checks on her daily      SLP Plan  Continue with current plan of care      Recommendations for follow up therapy are one component of a multi-disciplinary discharge planning process, led by the attending physician.  Recommendations may be updated based on patient status, additional functional criteria and insurance authorization.    Recommendations  Diet recommendations: Dysphagia 3 (mechanical soft);Thin liquid Liquids provided via: Cup;Straw Medication Administration: Crushed with puree Supervision: Trained caregiver to feed patient Compensations: Minimize environmental distractions  Oral Care Recommendations: Oral care BID Follow Up Recommendations: Follow physician's recommendations for discharge plan and follow up therapies Assistance recommended at discharge: Frequent or constant Supervision/Assistance SLP Visit Diagnosis:  Dysphagia, unspecified (R13.10) Plan: Continue with current plan of care         Brilynn Biasi L. Tivis Ringer, [place] Office number [phone] Pager [phone]   Juan Quam Laurice  02/16/2021, 9:02 AM

## 2021-02-17 LAB — URINE CULTURE: Culture: >=100000 — AB

## 2021-02-17 LAB — GLUCOSE, CAPILLARY
Glucose-Capillary: 102 mg/dL — ABNORMAL HIGH (ref 70–99)
Glucose-Capillary: 105 mg/dL — ABNORMAL HIGH (ref 70–99)
Glucose-Capillary: 105 mg/dL — ABNORMAL HIGH (ref 70–99)
Glucose-Capillary: 97 mg/dL (ref 70–99)
Glucose-Capillary: 99 mg/dL (ref 70–99)

## 2021-02-17 MED ORDER — CEPHALEXIN 500 MG PO CAPS: 500.0000 mg | ORAL_CAPSULE | Freq: Four times a day (QID) | ORAL | 0 refills | Status: AC

## 2021-02-17 MED ORDER — LORAZEPAM 0.5 MG PO TABS: 0.5000 mg | ORAL_TABLET | Freq: Three times a day (TID) | ORAL | 0 refills | Status: AC | PRN

## 2021-02-17 MED ORDER — QUETIAPINE FUMARATE 25 MG PO TABS: 25.0000 mg | ORAL_TABLET | Freq: Two times a day (BID) | ORAL | 1 refills | Status: DC | PRN

## 2021-02-17 NOTE — TOC Transition Note (Signed)
Transition of Care [facility]) - CM/SW Discharge Note   Patient Details  Name: [name] MRN: [id number] Date of Birth: 09/20/1931  [facility] CM/SW Contact:  Carles Collet, RN Phone Number: 02/17/2021, 10:19 AM   Clinical Narrative:    Damaris Schooner w patient's daughter who states that DME will be delivered at 12:00.  She is agreeable to scheduling PTAR for 12:30. Verified demographics. PTAR states that they are running on a limited number of trucks.  Initial home hospice visit scheduled for 5pm PTAR forms on chart, DNR on chart      Final next level of care: Home w Hospice Care Barriers to Discharge: Continued Medical Work up   Patient Goals and CMS Choice Patient states their goals for this hospitalization and ongoing recovery are:: family has decided home with hospice services.   Choice offered to / list presented to : Adult Children  Discharge Placement                       Discharge Plan and Services In-house Referral: NA Discharge Planning Services: CM Consult Post Acute Care Choice: Hospice (Patient is currently active with Palliative Services via [facility].)          DME Arranged: Hospital bed, Overbed table DME Agency: [facility] and [place] ([facility] as [facility] Date DME Agency Contacted: 02/15/21 Time DME Agency Contacted: 1350 Representative spoke with at DME Agency: [place] Arranged: RN [facility] Agency: Hospice and [place] ([place]) Date [place]: 02/15/21 Time [place]: 1350 Representative spoke with at [place]: [place] ([place]) Interventions     Readmission Risk Interventions No flowsheet data found.

## 2021-02-17 NOTE — Progress Notes (Signed)
Reached out to provider- patient being combative trying to punch and kick staff, as well as bite off her soft mittens.  She is also trying to "get out of here" and placing her legs over side rail to get out of bed.  Backup nursing assistance called to help calm patient down and reorient and redirect her.  No success made, obtained order for PO Seroquel which was spit back in my face.  After reaching out to provider again and calling daughter to obtain permission, IV Ativan was given.  Change of shift given to night RN.

## 2021-02-17 NOTE — Discharge Summary (Signed)
Physician Discharge Summary  CASONDRA GASCA ZCH:[id number] DOB: 1931-05-19 DOA: 02/13/2021  PCP: [name], NP  Admit date: 02/13/2021 Discharge date: 02/17/2021  Admitted From: Home  Disposition:  Home with Hospice.   Recommendations for Outpatient Follow-up:  Home with Hospice.     Discharge Condition: Stable.  CODE STATUS:DNR Diet recommendation: Dysphagia 3 diet  Brief/Interim Summary: 88 year old with past medical history significant for Alzheimer's dementia, hypertension, hypothyroidism who presents via EMS with respiratory failure, decreased responsiveness and respiratory distress.  Patient was in her normal state of health when she suddenly became unresponsive the night of admission.  The aide started CPR.  911 instruction even though patient had a DNR form but the aide did not mention that to 911.  When EMS arrived patient had significant respiratory depression and was a started on a bag-valve-mask to aid respiration.  After arriving in the emergency room patient was noted to be apneic with no spontaneous respiration and the family requested the ER doctor to continue bag-valve-mask therapy until family could get there in 5 or 10 minutes.  When family arrived patient  spontaneously started breathing on her own and over the next few hours slowly had periods where she will will move her extremity and upper her eyes and occasionally answer questions with single word answers.     Discharge Diagnoses:  Principal Problem:   Sepsis ([place]) Active Problems:   Hypothyroidism   Essential hypertension   Dementia with behavioral disturbance   UTI (urinary tract infection)   Respiratory failure with hypoxia (HCC)   Prolonged QT interval   CKD (chronic kidney disease), stage III (HCC)   1-Sepsis: secondary to UTI.  Patient presents with hypothermia,  respiratory failure, UA consistent with UTI. She was placed on  air Hugger.  Treated with  IV ceftriaxone for 3 days. Discharge on  keflex for 2 days.  Urine culture growing E coli. Sensitive to ceftriaxone.    2-Respiratory arrest: Unclear etiology, suspect related to hypothermia and sepsis. MRI negative for stroke, CTA chest negative for PE. EEG negative for seizures ECHO; Normal EF Discussed with family, patient continued to be DNR. Palliative care consulted   3-Acute Metabolic Encephalopathy; secondary to infection, resp arrest. And underline dementia.  Patient will benefit form palliative and hospice care.  Ammonia normal. B12 not low.  MRI negative for stroke.  Plan for home with Hospice care.  She became more agitated overnight. Plan to discharge home to prior environment with family. I think that will help with agitation. Will also provide prescription for xanax and PRN Seroquel.    Hypokalemia: Replaced.    4-Hypothyroidism: TSH elevated initially at 17.  Repeated TSH  3.4. Continue with current home meds dose.   HTN; on Norvasc.    CKD stage IIIB;  Monitor.received IV fluids.    Prolong QT. Replete electrolytes.  Dementia; Continue with Namenda. Hold Aricept due to prolong QT   Discharge Instructions  Discharge Instructions     Diet - low sodium heart healthy   Complete by: As directed    Increase activity slowly   Complete by: As directed       Allergies as of 02/17/2021   No Known Allergies      Medication List     STOP taking these medications    donepezil 10 MG tablet Commonly known as: Aricept       TAKE these medications    acetaminophen 325 MG tablet Commonly known as: Tylenol Take 2 tablets (650 mg  total) by mouth every 6 (six) hours as needed.   amLODipine 5 MG tablet Commonly known as: NORVASC TAKE 1 TABLET(5 MG) BY MOUTH DAILY What changed: See the new instructions.   buPROPion 150 MG 24 hr tablet Commonly known as: WELLBUTRIN XL TAKE 1 TABLET BY MOUTH EVERY DAY What changed: additional instructions   cephALEXin 500 MG capsule Commonly known as:  KEFLEX Take 1 capsule (500 mg total) by mouth 4 (four) times daily for 2 days.   levothyroxine 75 MCG tablet Commonly known as: SYNTHROID TAKE 1 TABLET(75 MCG) BY MOUTH DAILY What changed: See the new instructions.   LORazepam 0.5 MG tablet Commonly known as: Ativan Take 1-2 tablets (0.5-1 mg total) by mouth every 8 (eight) hours as needed for anxiety (Agitation).   memantine 10 MG tablet Commonly known as: NAMENDA Take 1 tablet (10 mg total) by mouth 2 (two) times daily.   QUEtiapine 25 MG tablet Commonly known as: SEROQUEL Take 1 tablet (25 mg total) by mouth 2 (two) times daily as needed (Agitation). What changed: See the new instructions.   venlafaxine XR 75 MG 24 hr capsule Commonly known as: EFFEXOR-XR TAKE 1 CAPSULE BY MOUTH EVERY DAY WITH BREAKFAST What changed: See the new instructions.   Vitamin D (Ergocalciferol) 1.25 MG (50000 UNIT) Caps capsule Commonly known as: DRISDOL TAKE 1 CAPSULE BY MOUTH EVERY 14 DAYS What changed: See the new instructions.        Follow-up Information     [facility] Follow up.   Specialty: PALLIATIVE CARE Why: Registered Nurse-For Outpatient Palliative Services office to call with visit times. Contact information: [place] [phone]               No Known Allergies  Consultations: Palliative care  Procedures/Studies: CT Angio Head W or Wo Contrast  Result Date: 02/14/2021 CLINICAL DATA:  Sudden severe headache EXAM: CT ANGIOGRAPHY HEAD TECHNIQUE: Multidetector CT imaging of the head was performed using the standard protocol during bolus administration of intravenous contrast. Multiplanar CT image reconstructions and MIPs were obtained to evaluate the vascular anatomy. CONTRAST:  55mL OMNIPAQUE IOHEXOL 350 MG/ML SOLN COMPARISON:  Head CT 02/13/2021 FINDINGS: POSTERIOR CIRCULATION: --Vertebral arteries: Right V4 segment is non-opacified. Normal left. --Inferior  cerebellar arteries: Normal. --Basilar artery: Normal. --Superior cerebellar arteries: Normal. --Posterior cerebral arteries: Moderate narrowing at the distal right P3 segment. No occlusion or stenosis of the left PCA. ANTERIOR CIRCULATION: --Intracranial internal carotid arteries: Normal. --Anterior cerebral arteries (ACA): There is moderate stenosis of the proximal left A2 segment. No other stenosis or occlusion. --Middle cerebral arteries (MCA): Normal. ANATOMIC VARIANTS: None Review of the MIP images confirms the above findings. IMPRESSION: 1. No intracranial arterial occlusion or high-grade stenosis. 2. Occlusion of the right vertebral artery V4 segment. 3. Moderate narrowing of the distal right P3 segment. 4. Moderate stenosis of the proximal left A2 segment. Electronically Signed   By: Ulyses Jarred M.D.   On: 02/14/2021 02:34   CT Head Wo Contrast  Result Date: 02/13/2021 CLINICAL DATA:  Loss of consciousness EXAM: CT HEAD WITHOUT CONTRAST TECHNIQUE: Contiguous axial images were obtained from the base of the skull through the vertex without intravenous contrast. COMPARISON:  None. FINDINGS: Brain: There is no mass, hemorrhage or extra-axial collection. There is generalized atrophy without lobar predilection. Hypodensity of the white matter is most commonly associated with chronic microvascular disease. Small calcified meningioma in the left posterior fossa. Vascular: Atherosclerotic calcification of the internal carotid arteries at  the skull base. No abnormal hyperdensity of the major intracranial arteries or dural venous sinuses. Skull: The visualized skull base, calvarium and extracranial soft tissues are normal. Sinuses/Orbits: No fluid levels or advanced mucosal thickening of the visualized paranasal sinuses. No mastoid or middle ear effusion. The orbits are normal. IMPRESSION: Generalized atrophy and chronic microvascular ischemia without acute intracranial abnormality. Electronically Signed   By:  Ulyses Jarred M.D.   On: 02/13/2021 23:45   CT Angio Chest Pulmonary Embolism (PE) W or WO Contrast  Result Date: 02/14/2021 CLINICAL DATA:  Pulmonary embolism (PE) suspected, high prob EXAM: CT ANGIOGRAPHY CHEST WITH CONTRAST TECHNIQUE: Multidetector CT imaging of the chest was performed using the standard protocol during bolus administration of intravenous contrast. Multiplanar CT image reconstructions and MIPs were obtained to evaluate the vascular anatomy. CONTRAST:  52mL OMNIPAQUE IOHEXOL 350 MG/ML SOLN COMPARISON:  None. FINDINGS: Cardiovascular: Satisfactory opacification of the pulmonary arteries to the segmental level. No evidence of pulmonary embolism. Normal heart size. No pericardial effusion. Mediastinum/Nodes: Hiatal hernia. Soft tissue at the right aspect of the hernia. Representative measurement of 2.4 cm on series 5, image 72. Lungs/Pleura: Bibasilar atelectasis/scarring. No pleural effusion or pneumothorax. Upper Abdomen: No acute abnormality. Musculoskeletal: Levoscoliosis distorting the thorax. Chronic appearing compression fractures. Review of the MIP images confirms the above findings. IMPRESSION: No evidence of acute pulmonary embolism or other acute abnormality. Hiatal hernia. There is soft tissue at the right aspect of the hernia likely reflecting adenopathy and suspicious for malignancy. Electronically Signed   By: Macy Mis M.D.   On: 02/14/2021 08:10   MR BRAIN WO CONTRAST  Result Date: 02/14/2021 CLINICAL DATA:  Delirium EXAM: MRI HEAD WITHOUT CONTRAST TECHNIQUE: Multiplanar, multiecho pulse sequences of the brain and surrounding structures were obtained without intravenous contrast. COMPARISON:  Yesterday and CTA from yesterday FINDINGS: Brain: No acute infarction, hemorrhage, hydrocephalus, extra-axial collection or mass lesion. Advanced brain atrophy, especially at the temporal lobes, correlating well with history of Alzheimer's disease. Confluent chronic small vessel  ischemia which is diffuse. Small remote left cerebellar infarction. Vascular: Occluded right vertebral artery with absent flow void, known from CTA yesterday. Skull and upper cervical spine: Normal marrow signal Sinuses/Orbits: Bilateral cataract resection. Partial right mastoid opacification. IMPRESSION: 1. No emergent or reversible finding. 2. Advanced brain atrophy and chronic small vessel ischemia. Electronically Signed   By: Jorje Guild M.D.   On: 02/14/2021 11:52   DG Chest Portable 1 View  Result Date: 02/14/2021 CLINICAL DATA:  Unresponsiveness. EXAM: PORTABLE CHEST 1 VIEW COMPARISON:  Chest radiograph dated 02/02/2020 FINDINGS: Diffuse interstitial prominence. No focal consolidation, pleural effusion or pneumothorax. Cardiomegaly. Atherosclerotic calcification of the aorta. No acute osseous pathology. IMPRESSION: 1. No acute cardiopulmonary process. 2. Cardiomegaly. Electronically Signed   By: Anner Crete M.D.   On: 02/14/2021 02:44   EEG adult  Result Date: 02/14/2021 Lora Havens, MD     02/14/2021 10:22 AM Patient Name: COURTENEY ALDERETE MRN: [id number] Epilepsy Attending: Lora Havens Referring Physician/Provider: Dr Niel Hummer Date: 02/14/2021 Duration: 21.35 mins Patient history: 88 year old female with altered mental status.  EEG to evaluate for seizure. Level of alertness: Awake AEDs during EEG study: None Technical aspects: This EEG study was done with scalp electrodes positioned according to the 10-20 International system of electrode placement. Electrical activity was acquired at a sampling rate of 500Hz  and reviewed with a high frequency filter of 70Hz  and a low frequency filter of 1Hz . EEG data were recorded continuously and digitally stored.  Description: The posterior dominant rhythm consists of 9 Hz activity of moderate voltage (25-35 uV) seen predominantly in posterior head regions, symmetric and reactive to eye opening and eye closing. EEG showed intermittent  generalized polymorphic sharply contoured 5 to 6 Hz theta slowing with overriding 15 to 18 Hz generalized beta activity. Hyperventilation and photic stimulation were not performed.   ABNORMALITY - Intermittent slow, generalized IMPRESSION: This study is suggestive of mild diffuse encephalopathy, nonspecific etiology. No seizures or epileptiform discharges were seen throughout the recording. Lora Havens   ECHOCARDIOGRAM COMPLETE  Result Date: 02/14/2021    ECHOCARDIOGRAM REPORT   Patient Name:   SATRINA MAGALLANES Date of Exam: 02/14/2021 Medical Rec #:  [id number]     Height:       64.0 in Accession #:    [id number]    Weight:       138.9 lb Date of Birth:  1932-01-24     BSA:          1.676 m Patient Age:    78 years      BP:           126/77 mmHg Patient Gender: F             HR:           84 bpm. Exam Location:  Inpatient Procedure: 2D Echo, Cardiac Doppler and Color Doppler Indications:    Stroke  History:        Patient has prior history of Echocardiogram examinations, most                 recent 11/13/2016. Signs/Symptoms:Altered Mental Status.  Sonographer:    Merrie Roof RDCS Referring Phys: [id number] A Azeez Dunker  Sonographer Comments: Image acquisition challenging due to patient behavioral factors. The patient refused to continue the echo. IMPRESSIONS  1. Limited images obtained due to uncooperative patient.  2. Left ventricular ejection fraction, by estimation, is 60 to 65%. The left ventricle has normal function. The left ventricle has no regional wall motion abnormalities. Left ventricular diastolic parameters are consistent with Grade I diastolic dysfunction (impaired relaxation).  3. Right ventricular systolic function is normal. The right ventricular size is normal.  4. The mitral valve is grossly normal. No evidence of mitral valve regurgitation. No evidence of mitral stenosis.  5. The aortic valve is grossly normal. Aortic valve regurgitation is not visualized. No aortic stenosis is present.  FINDINGS  Left Ventricle: Left ventricular ejection fraction, by estimation, is 60 to 65%. The left ventricle has normal function. The left ventricle has no regional wall motion abnormalities. The left ventricular internal cavity size was normal in size. There is  no left ventricular hypertrophy. Left ventricular diastolic parameters are consistent with Grade I diastolic dysfunction (impaired relaxation). Normal left ventricular filling pressure. Right Ventricle: The right ventricular size is normal. No increase in right ventricular wall thickness. Right ventricular systolic function is normal. Left Atrium: Left atrial size was normal in size. Right Atrium: Right atrial size was normal in size. Pericardium: There is no evidence of pericardial effusion. Mitral Valve: The mitral valve is grossly normal. No evidence of mitral valve regurgitation. No evidence of mitral valve stenosis. Tricuspid Valve: The tricuspid valve is grossly normal. Tricuspid valve regurgitation is not demonstrated. Aortic Valve: The aortic valve is grossly normal. Aortic valve regurgitation is not visualized. No aortic stenosis is present. Pulmonic Valve: The pulmonic valve was not assessed. Pulmonic valve regurgitation is not visualized. No evidence of pulmonic stenosis.  Aorta: Aortic root could not be assessed. IAS/Shunts: No atrial level shunt detected by color flow Doppler.   Diastology LV e' medial:    8.93 cm/s LV E/e' medial:  5.8 LV e' lateral:   9.60 cm/s LV E/e' lateral: 5.4  MITRAL VALVE MV Area (PHT): 3.89 cm MV Decel Time: 195 msec MV E velocity: 51.50 cm/s MV A velocity: 108.00 cm/s MV E/A ratio:  0.48 Skeet Latch MD Electronically signed by Skeet Latch MD Signature Date/Time: 02/14/2021/10:37:04 PM    Final      Subjective: Alert , confuse  Discharge Exam: Vitals:   02/17/21 0751 02/17/21 1131  BP: (!) 115/42 (!) 161/97  Pulse:  80  Resp: 19 17  Temp: 98 F (36.7 C) 98.1 F (36.7 C)  SpO2: 95% 94%      General: Pt is alert, awake, not in acute distress Cardiovascular: RRR, S1/S2 +, no rubs, no gallops Respiratory: CTA bilaterally, no wheezing, no rhonchi Abdominal: Soft, NT, ND, bowel sounds + Extremities: no edema, no cyanosis    The results of significant diagnostics from this hospitalization (including imaging, microbiology, ancillary and laboratory) are listed below for reference.     Microbiology: Recent Results (from the past 240 hour(s))  Urine Culture     Status: Abnormal   Collection Time: 02/13/21  2:37 PM   Specimen: In/Out Cath Urine  Result Value Ref Range Status   Specimen Description IN/OUT CATH URINE  Final   Special Requests NONE  Final   Culture (A)  Final    >=100,000 COLONIES/mL ESCHERICHIA COLI >=100,000 COLONIES/mL AEROCOCCUS SPECIES Standardized susceptibility testing for this organism is not available. Performed at [facility], [place] [address] [place], [place] [address]    Report Status 02/17/2021 FINAL  Final   Organism ID, Bacteria ESCHERICHIA COLI (A)  Final      Susceptibility   Escherichia coli - MIC*    AMPICILLIN 8 SENSITIVE Sensitive     CEFAZOLIN <=4 SENSITIVE Sensitive     CEFEPIME <=0.12 SENSITIVE Sensitive     CEFTRIAXONE <=0.25 SENSITIVE Sensitive     CIPROFLOXACIN <=0.25 SENSITIVE Sensitive     GENTAMICIN <=1 SENSITIVE Sensitive     IMIPENEM <=0.25 SENSITIVE Sensitive     NITROFURANTOIN <=16 SENSITIVE Sensitive     TRIMETH/SULFA <=20 SENSITIVE Sensitive     AMPICILLIN/SULBACTAM 4 SENSITIVE Sensitive     PIP/TAZO <=4 SENSITIVE Sensitive     * >=100,000 COLONIES/mL ESCHERICHIA COLI  Blood Cultures (routine x 2)     Status: None (Preliminary result)   Collection Time: 02/13/21 11:10 PM   Specimen: BLOOD RIGHT HAND  Result Value Ref Range Status   Specimen Description BLOOD RIGHT HAND  Final   Special Requests   Final    BOTTLES DRAWN AEROBIC AND ANAEROBIC Blood Culture results may not be optimal due to an inadequate  volume of blood received in culture bottles   Culture   Final    NO GROWTH 2 DAYS Performed at [facility], [address] [place], [place] [address]    Report Status PENDING  Incomplete  Blood Cultures (routine x 2)     Status: None (Preliminary result)   Collection Time: 02/13/21 11:55 PM   Specimen: BLOOD  Result Value Ref Range Status   Specimen Description BLOOD RIGHT ANTECUBITAL  Final   Special Requests   Final    AEROBIC BOTTLE ONLY Blood Culture results may not be optimal due to an inadequate volume of blood received  in culture bottles   Culture   Final    NO GROWTH 2 DAYS Performed at [facility], [place] [address], [place], [place] [address]    Report Status PENDING  Incomplete  Resp Panel by RT-PCR (Flu A&B, Covid) Nasopharyngeal Swab     Status: None   Collection Time: 02/14/21  3:16 AM   Specimen: Nasopharyngeal Swab; Nasopharyngeal(NP) swabs in vial transport medium  Result Value Ref Range Status   SARS Coronavirus 2 by RT PCR NEGATIVE NEGATIVE Final    Comment: (NOTE) SARS-CoV-2 target nucleic acids are NOT DETECTED.  The SARS-CoV-2 RNA is generally detectable in upper respiratory specimens during the acute phase of infection. The lowest concentration of SARS-CoV-2 viral copies this assay can detect is 138 copies/mL. A negative result does not preclude SARS-Cov-2 infection and should not be used as the sole basis for treatment or other patient management decisions. A negative result may occur with  improper specimen collection/handling, submission of specimen other than nasopharyngeal swab, presence of viral mutation(s) within the areas targeted by this assay, and inadequate number of viral copies(<138 copies/mL). A negative result must be combined with clinical observations, patient history, and epidemiological information. The expected result is Negative.  Fact Sheet for Patients:  EntrepreneurPulse.com.au  Fact Sheet for  Healthcare Providers:  IncredibleEmployment.be  This test is no t yet approved or cleared by the [place] FDA and  has been authorized for detection and/or diagnosis of SARS-CoV-2 by FDA under an Emergency Use Authorization (EUA). This EUA will remain  in effect (meaning this test can be used) for the duration of the COVID-19 declaration under Section 564(b)(1) of the Act, 21 U.S.C.section 360bbb-3(b)(1), unless the authorization is terminated  or revoked sooner.       Influenza A by PCR NEGATIVE NEGATIVE Final   Influenza B by PCR NEGATIVE NEGATIVE Final    Comment: (NOTE) The Xpert Xpress SARS-CoV-2/FLU/RSV plus assay is intended as an aid in the diagnosis of influenza from Nasopharyngeal swab specimens and should not be used as a sole basis for treatment. Nasal washings and aspirates are unacceptable for Xpert Xpress SARS-CoV-2/FLU/RSV testing.  Fact Sheet for Patients: EntrepreneurPulse.com.au  Fact Sheet for Healthcare Providers: IncredibleEmployment.be  This test is not yet approved or cleared by the [place] FDA and has been authorized for detection and/or diagnosis of SARS-CoV-2 by FDA under an Emergency Use Authorization (EUA). This EUA will remain in effect (meaning this test can be used) for the duration of the COVID-19 declaration under Section 564(b)(1) of the Act, 21 U.S.C. section 360bbb-3(b)(1), unless the authorization is terminated or revoked.  Performed at [facility], [place] [address], [place], [place] [address]      Labs: BNP (last 3 results) No results for input(s): BNP in the last 8760 hours. Basic Metabolic Panel: Recent Labs  Lab 02/13/21 2305 02/13/21 2334 02/14/21 0446 02/14/21 0616 02/15/21 0052 02/16/21 0908  NA 139 142 135  --  135 140  K 2.9* 2.9* 3.6  --  2.9* 3.3*  CL 106  --  103  --  102 105  CO2 22  --  24  --  20* 27  GLUCOSE 162*  --  101*  --  83 104*   BUN 19  --  15  --  8 9  CREATININE 1.43*  --  1.10*  --  0.98 1.13*  CALCIUM 8.9  --  8.1*  --  8.4* 9.1  MG  --   --   --  2.3  --   --    Liver Function Tests: Recent Labs  Lab 02/13/21 2305  AST 17  ALT 12  ALKPHOS 75  BILITOT 0.8  PROT 6.4*  ALBUMIN 3.8   No results for input(s): LIPASE, AMYLASE in the last 168 hours. Recent Labs  Lab 02/13/21 2305 02/14/21 1645  AMMONIA 21 30   CBC: Recent Labs  Lab 02/13/21 2305 02/13/21 2334 02/14/21 0446 02/15/21 0052 02/16/21 0908  WBC 11.8*  --  9.4 8.9 8.9  NEUTROABS 7.4  --   --   --   --   HGB 14.5 14.3 13.6 14.2 14.1  HCT 43.3 42.0 40.8 42.6 41.2  MCV 104.6*  --  104.1* 102.7* 101.5*  PLT 204  --  173 147* 223   Cardiac Enzymes: No results for input(s): CKTOTAL, CKMB, CKMBINDEX, TROPONINI in the last 168 hours. BNP: Invalid input(s): POCBNP CBG: Recent Labs  Lab 02/16/21 1944 02/17/21 0013 02/17/21 0449 02/17/21 0759 02/17/21 1140  GLUCAP 103* 99 97 105* 105*   D-Dimer No results for input(s): DDIMER in the last 72 hours. Hgb A1c No results for input(s): HGBA1C in the last 72 hours. Lipid Profile No results for input(s): CHOL, HDL, LDLCALC, TRIG, CHOLHDL, LDLDIRECT in the last 72 hours. Thyroid function studies No results for input(s): TSH, T4TOTAL, T3FREE, THYROIDAB in the last 72 hours.  Invalid input(s): FREET3 Anemia work up Recent Labs    02/15/21 0052  VITAMINB12 1,890*   Urinalysis    Component Value Date/Time   COLORURINE AMBER (A) 02/13/2021 0022   APPEARANCEUR CLOUDY (A) 02/13/2021 0022   LABSPEC 1.016 02/13/2021 0022   PHURINE 5.0 02/13/2021 0022   GLUCOSEU NEGATIVE 02/13/2021 0022   HGBUR NEGATIVE 02/13/2021 0022   BILIRUBINUR NEGATIVE 02/13/2021 0022   BILIRUBINUR neg 05/26/2019 1646   KETONESUR NEGATIVE 02/13/2021 0022   PROTEINUR 30 (A) 02/13/2021 0022   UROBILINOGEN 0.2 05/26/2019 1646   UROBILINOGEN 0.2 12/19/2014 2002   NITRITE POSITIVE (A) 02/13/2021 0022    LEUKOCYTESUR SMALL (A) 02/13/2021 0022   Sepsis Labs Invalid input(s): PROCALCITONIN,  WBC,  LACTICIDVEN Microbiology Recent Results (from the past 240 hour(s))  Urine Culture     Status: Abnormal   Collection Time: 02/13/21  2:37 PM   Specimen: In/Out Cath Urine  Result Value Ref Range Status   Specimen Description IN/OUT CATH URINE  Final   Special Requests NONE  Final   Culture (A)  Final    >=100,000 COLONIES/mL ESCHERICHIA COLI >=100,000 COLONIES/mL AEROCOCCUS SPECIES Standardized susceptibility testing for this organism is not available. Performed at [facility], [place] [address] [place], [place] [address]    Report Status 02/17/2021 FINAL  Final   Organism ID, Bacteria ESCHERICHIA COLI (A)  Final      Susceptibility   Escherichia coli - MIC*    AMPICILLIN 8 SENSITIVE Sensitive     CEFAZOLIN <=4 SENSITIVE Sensitive     CEFEPIME <=0.12 SENSITIVE Sensitive     CEFTRIAXONE <=0.25 SENSITIVE Sensitive     CIPROFLOXACIN <=0.25 SENSITIVE Sensitive     GENTAMICIN <=1 SENSITIVE Sensitive     IMIPENEM <=0.25 SENSITIVE Sensitive     NITROFURANTOIN <=16 SENSITIVE Sensitive     TRIMETH/SULFA <=20 SENSITIVE Sensitive     AMPICILLIN/SULBACTAM 4 SENSITIVE Sensitive     PIP/TAZO <=4 SENSITIVE Sensitive     * >=100,000 COLONIES/mL ESCHERICHIA COLI  Blood Cultures (routine x 2)     Status: None (Preliminary result)   Collection Time: 02/13/21  11:10 PM   Specimen: BLOOD RIGHT HAND  Result Value Ref Range Status   Specimen Description BLOOD RIGHT HAND  Final   Special Requests   Final    BOTTLES DRAWN AEROBIC AND ANAEROBIC Blood Culture results may not be optimal due to an inadequate volume of blood received in culture bottles   Culture   Final    NO GROWTH 2 DAYS Performed at [facility], [place] [address], [place], [place] [address]    Report Status PENDING  Incomplete  Blood Cultures (routine x 2)     Status: None (Preliminary result)   Collection Time: 02/13/21 11:55 PM    Specimen: BLOOD  Result Value Ref Range Status   Specimen Description BLOOD RIGHT ANTECUBITAL  Final   Special Requests   Final    AEROBIC BOTTLE ONLY Blood Culture results may not be optimal due to an inadequate volume of blood received in culture bottles   Culture   Final    NO GROWTH 2 DAYS Performed at [facility], [place] [address], [place],  [address]    Report Status PENDING  Incomplete  Resp Panel by RT-PCR (Flu A&B, Covid) Nasopharyngeal Swab     Status: None   Collection Time: 02/14/21  3:16 AM   Specimen: Nasopharyngeal Swab; Nasopharyngeal(NP) swabs in vial transport medium  Result Value Ref Range Status   SARS Coronavirus 2 by RT PCR NEGATIVE NEGATIVE Final    Comment: (NOTE) SARS-CoV-2 target nucleic acids are NOT DETECTED.  The SARS-CoV-2 RNA is generally detectable in upper respiratory specimens during the acute phase of infection. The lowest concentration of SARS-CoV-2 viral copies this assay can detect is 138 copies/mL. A negative result does not preclude SARS-Cov-2 infection and should not be used as the sole basis for treatment or other patient management decisions. A negative result may occur with  improper specimen collection/handling, submission of specimen other than nasopharyngeal swab, presence of viral mutation(s) within the areas targeted by this assay, and inadequate number of viral copies(<138 copies/mL). A negative result must be combined with clinical observations, patient history, and epidemiological information. The expected result is Negative.  Fact Sheet for Patients:  EntrepreneurPulse.com.au  Fact Sheet for Healthcare Providers:  IncredibleEmployment.be  This test is no t yet approved or cleared by the [place] FDA and  has been authorized for detection and/or diagnosis of SARS-CoV-2 by FDA under an Emergency Use Authorization (EUA). This EUA will remain  in effect (meaning this test  can be used) for the duration of the COVID-19 declaration under Section 564(b)(1) of the Act, 21 U.S.C.section 360bbb-3(b)(1), unless the authorization is terminated  or revoked sooner.       Influenza A by PCR NEGATIVE NEGATIVE Final   Influenza B by PCR NEGATIVE NEGATIVE Final    Comment: (NOTE) The Xpert Xpress SARS-CoV-2/FLU/RSV plus assay is intended as an aid in the diagnosis of influenza from Nasopharyngeal swab specimens and should not be used as a sole basis for treatment. Nasal washings and aspirates are unacceptable for Xpert Xpress SARS-CoV-2/FLU/RSV testing.  Fact Sheet for Patients: EntrepreneurPulse.com.au  Fact Sheet for Healthcare Providers: IncredibleEmployment.be  This test is not yet approved or cleared by the [place] FDA and has been authorized for detection and/or diagnosis of SARS-CoV-2 by FDA under an Emergency Use Authorization (EUA). This EUA will remain in effect (meaning this test can be used) for the duration of the COVID-19 declaration under Section 564(b)(1) of the Act, 21 U.S.C. section 360bbb-3(b)(1),  unless the authorization is terminated or revoked.  Performed at [facility], [place] [address], [place], [place] [address]      Time coordinating discharge: 40 minutes  SIGNED:   Elmarie Shiley, MD  [facility]

## 2021-02-18 ENCOUNTER — Other Ambulatory Visit: Payer: Medicare HMO | Admitting: Hospice

## 2021-02-18 ENCOUNTER — Other Ambulatory Visit: Payer: Self-pay

## 2021-02-19 ENCOUNTER — Telehealth: Payer: Self-pay | Admitting: *Deleted

## 2021-02-19 LAB — CULTURE, BLOOD (ROUTINE X 2)
Culture: NO GROWTH
Culture: NO GROWTH

## 2021-02-19 NOTE — Telephone Encounter (Signed)
Christy with [facility] called stating that patient was referred from the hospital.   Wanting to know if you will be the Hospice Attending.   Please Advise.

## 2021-02-19 NOTE — Telephone Encounter (Signed)
LMOM to return call.

## 2021-02-19 NOTE — Telephone Encounter (Signed)
Transition Care Management Follow-up Telephone Call Date of discharge and from where: 02/17/2021 [facility] How have you been since you were released from the hospital? good Any questions or concerns? No  Items Reviewed: Did the pt receive and understand the discharge instructions provided? Yes  Medications obtained and verified? Yes  Other? No  Any new allergies since your discharge? No  Dietary orders reviewed? Yes Do you have support at home? Yes Daughters and [place] and Equipment/Supplies: Were home health services ordered? no If so, what is the name of the agency? [facility]  Has the agency set up a time to come to the patient's home? not applicable Were any new equipment or medical supplies ordered?  No What is the name of the medical supply agency? na Were you able to get the supplies/equipment? not applicable Do you have any questions related to the use of the equipment or supplies? No  Functional Questionnaire: (I = Independent and D = Dependent) ADLs: D  Bathing/Dressing- D  Meal Prep- D  Eating- I  Maintaining continence- I  Transferring/Ambulation- D not walking yet, uses walker to stand up  Managing Meds- D  Follow up appointments reviewed:  PCP Hospital f/u appt confirmed? No  Stated that the other daughter is fixing to retire and will be bringing patient to appointments and she will have her give [place] a call to schedule a [facility] f/u appt confirmed? No   Are transportation arrangements needed? No  If their condition worsens, is the pt aware to call PCP or go to the Emergency Dept.? Yes Was the patient provided with contact information for the PCP's office or ED? Yes Was to pt encouraged to call back with questions or concerns? Yes

## 2021-02-19 NOTE — Telephone Encounter (Signed)
Yes that is correct. 

## 2021-02-20 NOTE — Telephone Encounter (Signed)
LMOM to return call.

## 2021-03-22 ENCOUNTER — Ambulatory Visit: Payer: Medicare HMO | Admitting: Nurse Practitioner

## 2021-04-10 ENCOUNTER — Emergency Department (HOSPITAL_COMMUNITY)

## 2021-04-10 ENCOUNTER — Emergency Department (HOSPITAL_COMMUNITY)
Admission: EM | Admit: 2021-04-10 | Discharge: 2021-04-10 | Disposition: A | Discharge status: Home / Self Care | Attending: Emergency Medicine | Admitting: Emergency Medicine

## 2021-04-10 DIAGNOSIS — W2209XA Striking against other stationary object, initial encounter: Secondary | ICD-10-CM | POA: Insufficient documentation

## 2021-04-10 DIAGNOSIS — I10 Essential (primary) hypertension: Secondary | ICD-10-CM | POA: Insufficient documentation

## 2021-04-10 DIAGNOSIS — R58 Hemorrhage, not elsewhere classified: Secondary | ICD-10-CM | POA: Diagnosis not present

## 2021-04-10 DIAGNOSIS — S0101XA Laceration without foreign body of scalp, initial encounter: Secondary | ICD-10-CM | POA: Insufficient documentation

## 2021-04-10 DIAGNOSIS — R0902 Hypoxemia: Secondary | ICD-10-CM | POA: Diagnosis not present

## 2021-04-10 DIAGNOSIS — S0990XA Unspecified injury of head, initial encounter: Secondary | ICD-10-CM | POA: Diagnosis present

## 2021-04-10 DIAGNOSIS — W19XXXA Unspecified fall, initial encounter: Secondary | ICD-10-CM

## 2021-04-10 DIAGNOSIS — F0390 Unspecified dementia without behavioral disturbance: Secondary | ICD-10-CM | POA: Diagnosis not present

## 2021-04-10 DIAGNOSIS — R404 Transient alteration of awareness: Secondary | ICD-10-CM | POA: Diagnosis not present

## 2021-04-10 DIAGNOSIS — Z79899 Other long term (current) drug therapy: Secondary | ICD-10-CM | POA: Insufficient documentation

## 2021-04-10 MED ORDER — LIDOCAINE-EPINEPHRINE 2 %-1:200000 IJ SOLN
10.0000 mL | Freq: Once | INTRAMUSCULAR | Status: AC
Start: 2021-04-10 — End: 2021-04-10
  Administered 2021-04-10: 10 mL
  Filled 2021-04-10: qty 20

## 2021-04-10 MED ORDER — ACETAMINOPHEN 500 MG PO TABS
1000.0000 mg | ORAL_TABLET | Freq: Once | ORAL | Status: AC
  Administered 2021-04-10: 1000 mg via ORAL
  Filled 2021-04-10: qty 2

## 2021-04-10 NOTE — ED Provider Notes (Signed)
[facility] EMERGENCY DEPARTMENT Provider Note   CSN: [id number] Arrival date & time: 04/10/21  1449     History  No chief complaint on file.   [name] is a 88 y.o. female.  Pt is a 88 yo wf with a hx of severe dementia on hospice, gerd, htn, anxiety, and depression.  Pt lives with her daughter and daughter gives the hx.  Pt's daughter left pt in the living room reading a magazine.  She left to go get lunch and heard pt fall.  Pt hit her head on the the coffee table.  Pt did not have a loc.  She is not ambulatory at baseline and is not oriented normally.  She is at her normal mental status currently.        Home Medications Prior to Admission medications   Medication Sig Start Date End Date Taking? Authorizing Provider  acetaminophen (TYLENOL) 325 MG tablet Take 2 tablets (650 mg total) by mouth every 6 (six) hours as needed. 07/24/17   Jacqlyn Larsen, PA-C  amLODipine (NORVASC) 5 MG tablet TAKE 1 TABLET(5 MG) BY MOUTH DAILY Patient taking differently: Take 5 mg by mouth daily. 10/03/20   [name], NP  buPROPion (WELLBUTRIN XL) 150 MG 24 hr tablet TAKE 1 TABLET BY MOUTH EVERY DAY Patient taking differently: Take 150 mg by mouth daily. TAKE 1 TABLET BY MOUTH EVERY DAY 10/02/20   [name], NP  levothyroxine (SYNTHROID) 75 MCG tablet TAKE 1 TABLET(75 MCG) BY MOUTH DAILY Patient taking differently: Take 75 mcg by mouth daily. 07/02/20   [name], NP  LORazepam (ATIVAN) 0.5 MG tablet Take 1-2 tablets (0.5-1 mg total) by mouth every 8 (eight) hours as needed for anxiety (Agitation). 02/17/21 02/17/22  Regalado, Belkys A, MD  memantine (NAMENDA) 10 MG tablet Take 1 tablet (10 mg total) by mouth 2 (two) times daily. 11/22/20   [name], NP  QUEtiapine (SEROQUEL) 25 MG tablet Take 1 tablet (25 mg total) by mouth 2 (two) times daily as needed (Agitation). 02/17/21   Regalado, Belkys A, MD  venlafaxine XR (EFFEXOR-XR) 75 MG 24 hr capsule TAKE  1 CAPSULE BY MOUTH EVERY DAY WITH BREAKFAST Patient taking differently: Take 75 mg by mouth daily with breakfast. TAKE 1 CAPSULE BY MOUTH EVERY DAY WITH BREAKFAST 10/02/20   [name], NP  Vitamin D, Ergocalciferol, (DRISDOL) 1.25 MG (50000 UNIT) CAPS capsule TAKE 1 CAPSULE BY MOUTH EVERY 14 DAYS Patient taking differently: Take 50,000 Units by mouth every 14 (fourteen) days. TAKE 1 CAPSULE BY MOUTH EVERY 14 DAYS 10/02/20   [name], NP      Allergies    Patient has no known allergies.    Review of Systems   Review of Systems  Unable to perform ROS: Dementia  All other systems reviewed and are negative.  Physical Exam Updated Vital Signs BP (!) 160/86    Pulse 82    Resp 14    SpO2 99%  Physical Exam Vitals and nursing note reviewed.  Constitutional:      Appearance: Normal appearance.  HENT:     Head: Normocephalic.      Right Ear: External ear normal.     Left Ear: External ear normal.     Nose: Nose normal.     Mouth/Throat:     Mouth: Mucous membranes are moist.     Pharynx: Oropharynx is clear.  Eyes:     Extraocular Movements:  Extraocular movements intact.     Conjunctiva/sclera: Conjunctivae normal.     Pupils: Pupils are equal, round, and reactive to light.  Cardiovascular:     Rate and Rhythm: Normal rate and regular rhythm.     Pulses: Normal pulses.     Heart sounds: Normal heart sounds.  Pulmonary:     Effort: Pulmonary effort is normal.     Breath sounds: Normal breath sounds.  Abdominal:     General: Abdomen is flat. Bowel sounds are normal.     Palpations: Abdomen is soft.  Musculoskeletal:        General: No signs of injury.     Cervical back: Normal range of motion and neck supple.  Skin:    General: Skin is warm.     Capillary Refill: Capillary refill takes less than 2 seconds.  Neurological:     Mental Status: She is alert. Mental status is at baseline.  Psychiatric:        Mood and Affect: Mood normal.    ED Results /  Procedures / Treatments   Labs (all labs ordered are listed, but only abnormal results are displayed) Labs Reviewed - No data to display  EKG None  Radiology CT Head Wo Contrast  Result Date: 04/10/2021 CLINICAL DATA:  Head trauma, fall EXAM: CT HEAD WITHOUT CONTRAST CT CERVICAL SPINE WITHOUT CONTRAST TECHNIQUE: Multidetector CT imaging of the head and cervical spine was performed following the standard protocol without intravenous contrast. Multiplanar CT image reconstructions of the cervical spine were also generated. RADIATION DOSE REDUCTION: This exam was performed according to the departmental dose-optimization program which includes automated exposure control, adjustment of the mA and/or kV according to patient size and/or use of iterative reconstruction technique. COMPARISON:  CT head 02/13/2021 FINDINGS: CT HEAD FINDINGS Brain: Moderate to advanced generalized atrophy. Negative for hydrocephalus. Extensive white matter hypodensity throughout both cerebral hemispheres unchanged. Chronic ischemic change in the basal ganglia bilaterally. 10 mm calcified mass posterior to left cerebellum compatible with meningioma unchanged from prior studies. Negative for acute infarct or hemorrhage. Vascular: Negative for hyperdense vessel Skull: Negative for fracture Sinuses/Orbits: Paranasal sinuses clear. Bilateral cataract extraction Other: Left posterior frontal scalp contusion and laceration. CT CERVICAL SPINE FINDINGS Alignment: Normal sagittal alignment.  Cervicothoracic scoliosis. Skull base and vertebrae: Negative for cervical spine fracture. Chronic fracture superior endplate of T1, unchanged from CT of 11/16/2018 Soft tissues and spinal canal: Negative for soft tissue swelling or mass Disc levels: Multilevel disc degeneration and spurring throughout the cervical spine. Foraminal stenosis bilaterally due to spurring. Upper chest: Lung apices clear bilaterally. Other: None IMPRESSION: 1. Atrophy and  chronic microvascular ischemia. No acute intracranial abnormality 2. 1 cm calcified meningioma left posterior fossa unchanged 3. Cervical spondylosis. Negative for cervical spine fracture. Chronic fracture T1. Electronically Signed   By: Franchot Gallo M.D.   On: 04/10/2021 16:20   CT Cervical Spine Wo Contrast  Result Date: 04/10/2021 CLINICAL DATA:  Head trauma, fall EXAM: CT HEAD WITHOUT CONTRAST CT CERVICAL SPINE WITHOUT CONTRAST TECHNIQUE: Multidetector CT imaging of the head and cervical spine was performed following the standard protocol without intravenous contrast. Multiplanar CT image reconstructions of the cervical spine were also generated. RADIATION DOSE REDUCTION: This exam was performed according to the departmental dose-optimization program which includes automated exposure control, adjustment of the mA and/or kV according to patient size and/or use of iterative reconstruction technique. COMPARISON:  CT head 02/13/2021 FINDINGS: CT HEAD FINDINGS Brain: Moderate to advanced generalized atrophy. Negative for  hydrocephalus. Extensive white matter hypodensity throughout both cerebral hemispheres unchanged. Chronic ischemic change in the basal ganglia bilaterally. 10 mm calcified mass posterior to left cerebellum compatible with meningioma unchanged from prior studies. Negative for acute infarct or hemorrhage. Vascular: Negative for hyperdense vessel Skull: Negative for fracture Sinuses/Orbits: Paranasal sinuses clear. Bilateral cataract extraction Other: Left posterior frontal scalp contusion and laceration. CT CERVICAL SPINE FINDINGS Alignment: Normal sagittal alignment.  Cervicothoracic scoliosis. Skull base and vertebrae: Negative for cervical spine fracture. Chronic fracture superior endplate of T1, unchanged from CT of 11/16/2018 Soft tissues and spinal canal: Negative for soft tissue swelling or mass Disc levels: Multilevel disc degeneration and spurring throughout the cervical spine. Foraminal  stenosis bilaterally due to spurring. Upper chest: Lung apices clear bilaterally. Other: None IMPRESSION: 1. Atrophy and chronic microvascular ischemia. No acute intracranial abnormality 2. 1 cm calcified meningioma left posterior fossa unchanged 3. Cervical spondylosis. Negative for cervical spine fracture. Chronic fracture T1. Electronically Signed   By: Franchot Gallo M.D.   On: 04/10/2021 16:20   DG Pelvis Portable  Result Date: 04/10/2021 CLINICAL DATA:  Fall EXAM: PORTABLE PELVIS 1-2 VIEWS COMPARISON:  Portable exam 1601 hours compared to CT abdomen and pelvis 06/29/2017 FINDINGS: Osseous demineralization. Hip and SI joint spaces preserved. No acute fracture, dislocation, or bone destruction. Sclerotic focus at LEFT iliac wing consistent with bone island, unchanged. IMPRESSION: No acute osseous abnormalities. Electronically Signed   By: Lavonia Dana M.D.   On: 04/10/2021 17:00   DG Chest Portable 1 View  Result Date: 04/10/2021 CLINICAL DATA:  Fall EXAM: PORTABLE CHEST 1 VIEW COMPARISON:  Portable exam 1557 hours compared to 02/14/2021 FINDINGS: Normal heart size, mediastinal contours, and pulmonary vascularity. Atherosclerotic calcification aorta. Bronchitic changes with bibasilar atelectasis. No definite acute infiltrate, pleural effusion, or pneumothorax. Bones demineralized. IMPRESSION: Bronchitic changes with bibasilar atelectasis. Aortic Atherosclerosis (ICD10-I70.0). Electronically Signed   By: Lavonia Dana M.D.   On: 04/10/2021 16:51    Procedures .Marland KitchenLaceration Repair  Date/Time: 04/10/2021 4:59 PM Performed by: Isla Pence, MD Authorized by: Isla Pence, MD   Consent:    Consent obtained:  Verbal   Consent given by:  Guardian   Risks, benefits, and alternatives were discussed: yes   Universal protocol:    Procedure explained and questions answered to patient or proxy's satisfaction: yes     Patient identity confirmed:  Arm band Anesthesia:    Anesthesia method:  Local  infiltration   Local anesthetic:  Lidocaine 2% WITH epi Laceration details:    Location:  Scalp   Scalp location:  L temporal   Length (cm):  10 Pre-procedure details:    Preparation:  Patient was prepped and draped in usual sterile fashion Exploration:    Limited defect created (wound extended): yes     Hemostasis achieved with:  Epinephrine Treatment:    Area cleansed with:  Povidone-iodine   Amount of cleaning:  Standard Skin repair:    Repair method:  Sutures   Suture size:  5-0   Suture material:  Prolene   Suture technique:  Simple interrupted   Number of sutures:  10 Approximation:    Approximation:  Close Repair type:    Repair type:  Simple Post-procedure details:    Procedure completion:  Tolerated well, no immediate complications    Medications Ordered in ED Medications  lidocaine-EPINEPHrine (XYLOCAINE W/EPI) 2 %-1:200000 (PF) injection 10 mL (10 mLs Infiltration Given 04/10/21 1702)  acetaminophen (TYLENOL) tablet 1,000 mg (1,000 mg Oral Given 04/10/21 1648)  ED Course/ Medical Decision Making/ A&P                           Medical Decision Making Amount and/or Complexity of Data Reviewed Radiology: ordered.  Risk OTC drugs. Prescription drug management.   This patient presents to the ED for concern of fall, this involves an extensive number of treatment options, and is a complaint that carries with it a high risk of complications and morbidity.  The differential diagnosis includes lac, head injury   Co morbidities that complicate the patient evaluation  dementia   Additional history obtained:  Additional history obtained from epic chart review External records from outside source obtained and reviewed including ems, daughter   Imaging Studies ordered:  I ordered imaging studies including CT head/CT c-spine, CXR and pelvis xray  I independently visualized and interpreted imaging which showed nothing acute I agree with the radiologist  interpretation   Medicines ordered and prescription drug management:  I ordered medication including tylenol  for pain  Reevaluation of the patient after these medicines showed that the patient improved I have reviewed the patients home medicines and have made adjustments as needed   Problem List / ED Course:  Lac;  sutured.  Sutures need to be removed in 10 days.   Reevaluation:  After the interventions noted above, I reevaluated the patient and found that they have :improved   Social Determinants of Health:  Lives with daughter   Dispostion:  After consideration of the diagnostic results and the patients response to treatment, I feel that the patent would benefit from discharge with outpatient f/u.          Final Clinical Impression(s) / ED Diagnoses Final diagnoses:  Fall, initial encounter  Laceration of scalp without foreign body, initial encounter    Rx / DC Orders ED Discharge Orders     None         Isla Pence, MD 04/10/21 [phone]

## 2021-04-10 NOTE — ED Triage Notes (Signed)
Pt had witnessed fall from sitting in chair at home, BIB [facility] after daughter called. Pt has 1in lac on forehead. Patient oriented x0, per baseline. Care managed by [facility].

## 2021-04-10 NOTE — Discharge Instructions (Signed)
Sutures need to be removed in 10 days.

## 2021-04-15 ENCOUNTER — Other Ambulatory Visit: Payer: Self-pay | Admitting: Nurse Practitioner

## 2021-04-22 ENCOUNTER — Other Ambulatory Visit: Payer: Self-pay | Admitting: Nurse Practitioner

## 2021-04-22 DIAGNOSIS — F332 Major depressive disorder, recurrent severe without psychotic features: Secondary | ICD-10-CM

## 2021-05-21 ENCOUNTER — Other Ambulatory Visit: Payer: Self-pay | Admitting: Nurse Practitioner

## 2021-05-21 DIAGNOSIS — F332 Major depressive disorder, recurrent severe without psychotic features: Secondary | ICD-10-CM

## 2021-05-26 ENCOUNTER — Other Ambulatory Visit: Payer: Self-pay | Admitting: Nurse Practitioner

## 2021-06-03 ENCOUNTER — Other Ambulatory Visit: Payer: Self-pay | Admitting: Nurse Practitioner

## 2021-07-02 ENCOUNTER — Other Ambulatory Visit: Payer: Self-pay | Admitting: Nurse Practitioner

## 2021-07-02 DIAGNOSIS — E039 Hypothyroidism, unspecified: Secondary | ICD-10-CM

## 2021-08-17 DEATH — deceased

## 2022-01-28 ENCOUNTER — Encounter: Payer: Medicare HMO | Admitting: Nurse Practitioner
